# Patient Record
Sex: Male | Born: 1935 | Race: White | Hispanic: No | Smoking: Former smoker
Health system: Southern US, Community
[De-identification: ages and names within clinical notes are randomized; demographics above are authoritative.]

## PROBLEM LIST (undated history)

## (undated) DIAGNOSIS — I2510 Atherosclerotic heart disease of native coronary artery without angina pectoris: Secondary | ICD-10-CM

## (undated) DIAGNOSIS — E785 Hyperlipidemia, unspecified: Secondary | ICD-10-CM

## (undated) DIAGNOSIS — F32A Depression, unspecified: Secondary | ICD-10-CM

## (undated) DIAGNOSIS — D649 Anemia, unspecified: Secondary | ICD-10-CM

## (undated) DIAGNOSIS — H9190 Unspecified hearing loss, unspecified ear: Secondary | ICD-10-CM

## (undated) DIAGNOSIS — I4891 Unspecified atrial fibrillation: Secondary | ICD-10-CM

## (undated) DIAGNOSIS — I509 Heart failure, unspecified: Secondary | ICD-10-CM

## (undated) DIAGNOSIS — C4490 Unspecified malignant neoplasm of skin, unspecified: Secondary | ICD-10-CM

## (undated) DIAGNOSIS — E119 Type 2 diabetes mellitus without complications: Secondary | ICD-10-CM

## (undated) DIAGNOSIS — G629 Polyneuropathy, unspecified: Secondary | ICD-10-CM

## (undated) DIAGNOSIS — I219 Acute myocardial infarction, unspecified: Secondary | ICD-10-CM

## (undated) DIAGNOSIS — J449 Chronic obstructive pulmonary disease, unspecified: Secondary | ICD-10-CM

## (undated) DIAGNOSIS — K921 Melena: Secondary | ICD-10-CM

## (undated) DIAGNOSIS — I10 Essential (primary) hypertension: Secondary | ICD-10-CM

## (undated) DIAGNOSIS — R42 Dizziness and giddiness: Secondary | ICD-10-CM

## (undated) DIAGNOSIS — M1990 Unspecified osteoarthritis, unspecified site: Secondary | ICD-10-CM

## (undated) DIAGNOSIS — M549 Dorsalgia, unspecified: Secondary | ICD-10-CM

## (undated) DIAGNOSIS — E669 Obesity, unspecified: Secondary | ICD-10-CM

## (undated) DIAGNOSIS — K429 Umbilical hernia without obstruction or gangrene: Secondary | ICD-10-CM

## (undated) DIAGNOSIS — K219 Gastro-esophageal reflux disease without esophagitis: Secondary | ICD-10-CM

## (undated) DIAGNOSIS — F329 Major depressive disorder, single episode, unspecified: Secondary | ICD-10-CM

## (undated) DIAGNOSIS — R0902 Hypoxemia: Secondary | ICD-10-CM

## (undated) HISTORY — DX: Type 2 diabetes mellitus without complications: E11.9

## (undated) HISTORY — PX: EYE SURGERY: SHX253

## (undated) HISTORY — DX: Chronic obstructive pulmonary disease, unspecified: J44.9

## (undated) HISTORY — DX: Polyneuropathy, unspecified: G62.9

## (undated) HISTORY — DX: Hyperlipidemia, unspecified: E78.5

## (undated) HISTORY — PX: OTHER SURGICAL HISTORY: SHX169

## (undated) HISTORY — DX: Essential (primary) hypertension: I10

## (undated) HISTORY — DX: Depression, unspecified: F32.A

## (undated) HISTORY — DX: Atherosclerotic heart disease of native coronary artery without angina pectoris: I25.10

## (undated) HISTORY — DX: Unspecified hearing loss, unspecified ear: H91.90

## (undated) HISTORY — PX: CORONARY ARTERY BYPASS GRAFT: SHX141

## (undated) HISTORY — DX: Unspecified malignant neoplasm of skin, unspecified: C44.90

## (undated) HISTORY — PX: MOHS SURGERY: SUR867

## (undated) HISTORY — DX: Dorsalgia, unspecified: M54.9

## (undated) HISTORY — DX: Umbilical hernia without obstruction or gangrene: K42.9

## (undated) HISTORY — PX: CORONARY ANGIOPLASTY: SHX604

## (undated) HISTORY — DX: Melena: K92.1

## (undated) HISTORY — DX: Heart failure, unspecified: I50.9

## (undated) HISTORY — DX: Major depressive disorder, single episode, unspecified: F32.9

## (undated) SURGERY — LAPAROSCOPIC CHOLECYSTECTOMY
Anesthesia: General

---

## 1992-11-29 HISTORY — PX: CORONARY ARTERY BYPASS GRAFT: SHX141

## 2004-12-14 ENCOUNTER — Ambulatory Visit: Payer: Self-pay | Admitting: Radiation Oncology

## 2005-12-15 ENCOUNTER — Ambulatory Visit: Payer: Self-pay | Admitting: Radiation Oncology

## 2006-12-19 ENCOUNTER — Ambulatory Visit: Payer: Self-pay | Admitting: Radiation Oncology

## 2012-01-18 DIAGNOSIS — E785 Hyperlipidemia, unspecified: Secondary | ICD-10-CM | POA: Insufficient documentation

## 2012-01-18 DIAGNOSIS — H9190 Unspecified hearing loss, unspecified ear: Secondary | ICD-10-CM | POA: Insufficient documentation

## 2012-01-18 DIAGNOSIS — E669 Obesity, unspecified: Secondary | ICD-10-CM | POA: Insufficient documentation

## 2012-01-18 DIAGNOSIS — I2510 Atherosclerotic heart disease of native coronary artery without angina pectoris: Secondary | ICD-10-CM | POA: Insufficient documentation

## 2012-01-18 DIAGNOSIS — G629 Polyneuropathy, unspecified: Secondary | ICD-10-CM | POA: Insufficient documentation

## 2012-08-11 DIAGNOSIS — M549 Dorsalgia, unspecified: Secondary | ICD-10-CM | POA: Insufficient documentation

## 2012-08-23 DIAGNOSIS — F329 Major depressive disorder, single episode, unspecified: Secondary | ICD-10-CM | POA: Insufficient documentation

## 2012-08-23 DIAGNOSIS — Q792 Exomphalos: Secondary | ICD-10-CM | POA: Insufficient documentation

## 2012-08-23 DIAGNOSIS — D039 Melanoma in situ, unspecified: Secondary | ICD-10-CM | POA: Insufficient documentation

## 2014-06-24 ENCOUNTER — Emergency Department: Payer: Self-pay | Admitting: Emergency Medicine

## 2014-06-24 LAB — COMPREHENSIVE METABOLIC PANEL
Albumin: 3.4 g/dL (ref 3.4–5.0)
Alkaline Phosphatase: 71 U/L
Anion Gap: 7 (ref 7–16)
BUN: 22 mg/dL — ABNORMAL HIGH (ref 7–18)
Bilirubin,Total: 0.4 mg/dL (ref 0.2–1.0)
Calcium, Total: 9.1 mg/dL (ref 8.5–10.1)
Chloride: 106 mmol/L (ref 98–107)
Co2: 26 mmol/L (ref 21–32)
Creatinine: 1.45 mg/dL — ABNORMAL HIGH (ref 0.60–1.30)
EGFR (African American): 51 — ABNORMAL LOW
EGFR (Non-African Amer.): 44 — ABNORMAL LOW
GLUCOSE: 118 mg/dL — AB (ref 65–99)
OSMOLALITY: 282 (ref 275–301)
Potassium: 4.6 mmol/L (ref 3.5–5.1)
SGOT(AST): 23 U/L (ref 15–37)
SGPT (ALT): 18 U/L
Sodium: 139 mmol/L (ref 136–145)
Total Protein: 7.1 g/dL (ref 6.4–8.2)

## 2014-06-24 LAB — CBC
HCT: 35.6 % — ABNORMAL LOW (ref 40.0–52.0)
HGB: 11.8 g/dL — AB (ref 13.0–18.0)
MCH: 30.6 pg (ref 26.0–34.0)
MCHC: 33.2 g/dL (ref 32.0–36.0)
MCV: 92 fL (ref 80–100)
Platelet: 253 10*3/uL (ref 150–440)
RBC: 3.86 10*6/uL — AB (ref 4.40–5.90)
RDW: 14.5 % (ref 11.5–14.5)
WBC: 8.8 10*3/uL (ref 3.8–10.6)

## 2014-06-24 LAB — TROPONIN I
Troponin-I: <0.02 ng/mL
Troponin-I: <0.02 ng/mL

## 2014-06-24 LAB — PROTIME-INR
INR: 0.9
Prothrombin Time: 12.1 secs (ref 11.5–14.7)

## 2014-06-24 LAB — APTT: ACTIVATED PTT: 29.6 s (ref 23.6–35.9)

## 2014-06-24 LAB — CK TOTAL AND CKMB (NOT AT ARMC)
CK, Total: 61 U/L
CK-MB: 1.1 ng/mL (ref 0.5–3.6)

## 2015-01-14 ENCOUNTER — Inpatient Hospital Stay: Payer: Self-pay | Admitting: Internal Medicine

## 2015-02-05 ENCOUNTER — Ambulatory Visit: Payer: Self-pay | Admitting: Family

## 2015-03-30 NOTE — Consult Note (Signed)
PATIENT NAME:  Justin Leonard, Justin Leonard MR#:  [id number] DATE OF BIRTH:  09/04/1927  DATE OF CONSULTATION:  01/14/2015  REFERRING PHYSICIAN:  Ceasar Lund. Anselm Jungling, MD CONSULTING PHYSICIAN:  Isaias Cowman, MD  PRIMARY CARE PHYSICIAN:  [name], MD  CARDIOLOGIST:  Pleas Patricia, MD, at [facility].   HISTORY OF PRESENT ILLNESS: The patient is an 81 year old gentleman with known history of coronary artery disease, status post CABG, non-insulin-dependent diabetes, hypertension, hyperlipidemia. He presents with a 4-5 day history of productive cough with greenish sputum. He saw his primary care physician and was advised to go to [facility] Emergency Room. Chest x-ray revealed evidence for pulmonary vascular congestion with borderline elevated troponin, mildly elevated BNP, and abnormal ECG. The patient denies chest pain.   PAST MEDICAL HISTORY: 1.  Status post CABG.  2.  Hypertension.  3.  Hyperlipidemia.  4.  Non-insulin-dependent diabetes.  5.  Bilateral peripheral neuropathy.  6.  Obesity.  7.  Deafness.  8.  Depression.  9.  History of GI bleed.   MEDICATIONS: Metoprolol 50 mg b.i.d., lovastatin 20 mg daily, lisinopril 40 mg daily,  isosorbide mononitrate 30 mg daily, hydrochlorothiazide 25 mg daily, amlodipine 10 mg daily, metformin 500 mg t.i.d., lovastatin 20 mg daily, glyburide 5 mg b.i.d., fluoxetine 20 mg daily.   SOCIAL HISTORY: The patient is married, lives at home with his wife. He quit tobacco abuse in the 1980s.   FAMILY HISTORY: No immediate family history of coronary artery disease or myocardial infarction.   REVIEW OF SYSTEMS:  CONSTITUTIONAL: No fever or chills.  EYES: No blurry vision.  EARS: No hearing loss.  RESPIRATORY: Shortness of breath with productive cough as described above.  CARDIOVASCULAR: No chest pain.  GASTROINTESTINAL: No nausea, vomiting, or diarrhea.  GENITOURINARY: No dysuria or hematuria.  ENDOCRINE: No polyuria or polydipsia.  MUSCULOSKELETAL:  No arthralgias or myalgias.  NEUROLOGICAL: No focal muscle weakness or numbness.  PSYCHOLOGICAL: No depression or anxiety.   PHYSICAL EXAMINATION: VITAL SIGNS: Blood pressure 147/70, pulse 83, respirations 18, temperature 98, pulse oximetry 98%.  HEENT: Pupils equal, reactive to light and accommodation.  NECK: Supple without thyromegaly.  LUNGS: Revealed a few expiratory wheezes.  CARDIOVASCULAR: Normal JVP. Normal PMI. Regular rate and rhythm. Normal S1, S2. No appreciable gallop, murmur, or rub.  ABDOMEN: Soft and nontender. Pulses were intact bilaterally.  MUSCULOSKELETAL: Normal muscle tone.  NEUROLOGIC: The patient is alert and oriented x 3. Motor and sensory both grossly intact.   IMPRESSION: An 81 year old gentleman who presents with probable upper respiratory infection, bronchitis with early pneumonia complicated with pulmonary edema and congestive heart failure. The patient has borderline elevated troponin in the setting of chronic kidney disease and anemia. This likely represents demand, supply ischemia and not an acute coronary syndrome. The patient currently denies chest pain.   RECOMMENDATIONS: 1.  Agree with overall current therapy.  2.  Continue heparin drip 48-72 hours.  3.  Review 2-D echocardiogram.  4.  Further recommendations pending the patient's initial clinical course and echocardiogram results.    ____________________________ Isaias Cowman, MD ap:LT D: 01/14/2015 17:53:32 ET T: 01/14/2015 18:12:41 ET JOB#: [id number]  cc: Isaias Cowman, MD, <Dictator> Isaias Cowman MD ELECTRONICALLY SIGNED 01/21/2015 14:37

## 2015-03-30 NOTE — H&P (Signed)
PATIENT NAME:  Justin Leonard, MIERZWA MR#:  [id number] DATE OF BIRTH:  07/02/1927  DATE OF ADMISSION:  01/14/2015  PRIMARY CARE PHYSICIAN: [place].   PRIMARY CARDIOLOGIST: Pleas Patricia, MD ([facility])  REFERRING EMERGENCY ROOM PHYSICIAN: Ferman Hamming, MD   CHIEF COMPLAINT: Cough.  HISTORY OF PRESENT ILLNESS: This is an 81 year old man who has history of coronary artery disease and CABG who has non-insulin-dependent diabetes, hypertension, hyperlipidemia. For the last 4 to 5 days he had complaint of cough and some greenish sputum production so he went to his primary care's office. They did some chest x-ray which shows some heterogenous opacity in left mid lung and lower lung suspecting possibility of atypical community-acquired pneumonia and so they advised him to go to the Emergency Room. The patient came here in ER for the same thing. In initial work-up in our Emergency Room, his chest x-ray showed enlargement of cardiac silhouette with pulmonary vascular congestion and CABG and there is atelectasis in left midlung. His BNP was elevated up to 2533 and his troponin was borderline high of 0.3 with some new EKG changes of T wave inversions in lateral leads, so he was given to hospitalist team for further management. He denies any chest pain or palpitation complaints.   REVIEW OF SYSTEMS: CONSTITUTIONAL: Negative for fever, fatigue, weakness, pain, or weight loss.  EYES: No blurring, double vision, discharge or redness.  EARS, NOSE, THROAT: No tinnitus, ear pain or hearing loss.  RESPIRATORY: The patient has some cough, mild wheezing and some shortness of breath.  CARDIOVASCULAR: No chest pain, orthopnea, edema, arrhythmia or palpitations.  GASTROINTESTINAL: No nausea, vomiting, diarrhea or abdominal pain.  GENITOURINARY: No dysuria, hematuria or increased frequency.  ENDOCRINE: No heat or cold intolerance. No excessive sweating.  SKIN: No acne, rashes or lesions.  MUSCULOSKELETAL: No pain  or swelling in the joints.  NEUROLOGICAL: No numbness, weakness, tremor, or vertigo. PSYCHIATRIC: No anxiety, insomnia, bipolar disorder.   PAST MEDICAL HISTORY: 1.  Coronary artery disease status post CABG.  2.  Non-insulin-dependent diabetes.  3.  Essential hypertension.  4.  Hyperlipidemia.  5.  Obesity.  6.  Deafness. 7.  Bilateral peripheral neuropathy, primary numbness affecting fingers in both hands bilaterally.  8.  Back pain.  9.  Umbilical hernia.  10.  Depression. 11.  Melena.   SOCIAL HISTORY: He was a smoker, stopped smoking in 48s. He denies alcohol or illegal drug use. Does not require any support. Lives at home and independent with his wife.  FAMILY HISTORY: Positive for diabetes in some family members.   HOME MEDICATIONS: 1.  Metoprolol 50 mg 2 times a day.  2.  Metformin 500 mg oral 3 times a day.  3.  Lovastatin 20 mg once a day.  4.  Lisinopril 40 mg once a day.  5.  Isosorbide mononitrate 30 mg once a day. 6.  Hydrochlorothiazide 25 mg once a day. 7.  Glyburide 5 mg 2 times a day.  8.  Fluoxetine 20 mg oral once a day.  9.  Amlodipine 10 mg once a day.   PHYSICAL EXAMINATION: VITAL SIGNS: In the ER, temperature 98.7, pulse 67, respirations 30, blood pressure 115/55, pulse ox 91 on room air.  GENERAL: The patient is fully alert and oriented to time, place, and person. Does not appear in acute distress.  HEENT: Head and neck atraumatic. Conjunctivae pink. Oral mucosa moist.  NECK: Supple. No JVD. Thyroid nontender.  RESPIRATORY: Bilateral equal air entry. Some crepitation and mild wheezing  present.  CARDIOVASCULAR: S1, S2 present, regular. No murmur.  ABDOMEN: Soft, nontender. Bowel sounds present. No organomegaly. No distention.  LEGS: No edema.  SKIN: No acne, rashes, or lesions.  MUSCULOSKELETAL: No tenderness or swelling in the joints.  NEUROLOGICAL: Power 5/5 and moves all 4 limbs. No tremor or rigidity.  PSYCHIATRIC: Does not appear in acute  psychiatric illness.   DIAGNOSTIC DATA: Chest x-ray, portable, shows mild enlargement of cardiac silhouette and pulmonary vascular congestion and CABG. COPD changes with left mid lung atelectasis.   INR is 1.2.   BNP 2533.   EKG shows sinus rhythm with some T wave inversion in lateral chest leads.   Glucose 177, BUN 22, creatinine 1.65, sodium 134, potassium 4.4, CO2 26, and calcium 9.5.   Troponin is 0.3.   WBC 12.3, hemoglobin 10.3, platelet count 271,000, MCV 89.  ASSESSMENT AND PLAN: An 81 year old male who has history of coronary artery disease and CABG, hypertension, hyperlipidemia and diabetes who was sent by primary care because of complaint of feeling shortness of breath and cough and found having findings of pneumonia on chest x-ray in PMDs office.  1.  Acute congestive heart failure. He has not had echocardiogram in the past so it is hard to say about diastolic versus systolic at this point. We will admit to telemetry, follow serial troponins, give IV Lasix low dose, and monitor renal function as it is already compromised and oxygen as needed basis and we will follow in and out and get an echocardiogram done. We will also get cardiology consult for further help. 2.  Elevated troponin. Troponin is 0.3, with the patient having new EKG changes without any chest pain. The patient is started on heparin drip by ER physician. I agree with that and currently continue. Will follow serial troponin, give beta blockers and aspirin and let cardiology decide further plan.  3.  Acute bronchitis. The patient was a smoker for a long time in the past, but quit since many years and does not require any inhaler on a regular basis. Currently, there is mild wheezing and has cough and sputum production for a few days so will start him on Rocephin and azithromycin for now and we will decide about requirement of antibiotic at the time of discharge with steroids.  4.  Diabetes. The patient has mild worsening in  renal function, so we would just like to monitor on insulin sliding scale coverage  5.  Worsening of renal function. As per previous record, creatinine was 1.45, today is 1.65. We will continue monitoring as the patient will be on Lasix IV.  6.  History of coronary artery disease. We will continue aspirin, statin and metoprolol and cardiology to follow in this admission.   CODE STATUS: FULL.  TOTAL TIME SPENT ON THIS ADMISSION: 50 minutes.   ____________________________ Ceasar Lund Anselm Jungling, MD vgv:sb D: 01/14/2015 16:35:20 ET T: 01/14/2015 16:55:35 ET JOB#: [id number]  cc: Ceasar Lund. Anselm Jungling, MD, <Dictator> Vaughan Basta MD ELECTRONICALLY SIGNED 01/15/2015 16:15

## 2015-03-30 NOTE — Discharge Summary (Signed)
PATIENT NAME:  Justin Leonard, Justin Leonard MR#:  [id number] DATE OF BIRTH:  Oct 22, 1927  For a detailed note, please see the history and physical done on admission by Dr. Boyce Medici.    DIAGNOSES AT DISCHARGE: As follows:  1.  Acute on chronic congestive heart failure secondary to diastolic dysfunction. 2.  Elevated troponin likely in the setting of demand ischemia. 3.  Hypertension.  4.  Diabetes.  5.  Acute and chronic renal failure. 6.  Chronic obstructive pulmonary disease.   DIET: The patient is being discharged on a low-sodium, low-fat, carb-controlled diet.   ACTIVITY: As tolerated.   FOLLOW UP:  Follow-up with Dr. Eston Esters in the next 1-2 weeks.    DISCHARGE MEDICATIONS:  1.  Imdur 30 mg daily. 2.  Lisinopril 40 mg daily. 3.  Lovastatin 20 mg daily. 4.  Metformin 5 mg t.i.d. 5.  Metoprolol tartrate 50 mg b.i.d. 6.  Norvasc 10 mg daily. 7.  Fluoxetine 20 mg daily. 8.  Glyburide 5 mg b.i.d. 9.  Ofloxacin 0.3% ophthalmic solution to be applied 4 times daily for 10 days. 10.  Spiriva 1 puff daily. 11.  Lasix 40 mg daily. 12.  Potassium 10 mEq daily. 13.  Levaquin 5 mg daily x 5 days.   CONSULTATIONS DURING HOSPITAL COURSE: Dr. Saralyn Pilar from cardiology.   PERTINENT STUDIES PERFORMED DURING THE HOSPITAL COURSE: A chest x-ray done on admission showing minimal enlargement of cardiac silhouette with pulmonary vascular congestion, chronic obstructive pulmonary disease with minimal atelectasis in the left midlung. A 2-dimensional echocardiogram showing ejection fraction of 55%-60%, mild left ventricular hypertrophy, mildly dilated left atrium, mild mitral valve regurgitation.   HOSPITAL COURSE: This is a 81 year old male with medical problems as mentioned above, presented to the hospital with shortness of breath and cough and noted to be in congestive heart failure and also noted to have bronchitis.  1.  Congestive heart failure. This was likely acute on chronic diastolic dysfunction. The  patient was admitted to the hospital, started on IV Lasix, has diuresed well with the diuretics. His echocardiogram showed normal ejection fraction. The patient was seen by cardiology who agreed with this management. At this point, the patient is being discharged on some Lasix along with maintenance of his beta blockers and ACE inhibitors.   2.  Elevated troponin. This was likely in the setting of demand ischemia from the congestive heart failure. The patient was seen by cardiology. Initially, they thought this could possibly be an NSTEMI. The patient was started on a heparin drip, but this was quickly discontinued as his troponins did not trend upwards. His echocardiogram showed normal ejection fraction as per cardiology. There is no plan on doing any acute cardiac intervention. The patient was discharged on aspirin, statin, beta blocker, Imdur and ACE inhibitor as stated.   3.  Acute bronchitis with underlying chronic obstructive pulmonary disease. The patient's chest x-ray findings were suggestive of chronic obstructive pulmonary disease. He was started on some Spiriva while in the hospital. He was discharged on that along with some empiric Levaquin. While in the hospital he was maintained on ceftriaxone and Zithromax. His bronchospasm and wheezing has improved and resolved now.   4.  Diabetes. The patient's creatinine was a little bit elevated; therefore, his metformin was held. He was maintained on sliding scale insulin. Since his renal function is improved he is being discharged back on his oral metformin.  5.  Acute on chronic renal failure. This was likely secondary to his congestive heart failure. The  patient was diuresed. His creatinine is back to baseline. His ACE inhibitor and metformin were held, although they are being resumed upon discharge.   CODE STATUS: The patient is a full code.   DISPOSITION: He is being discharged home.    TIME SPENT: 40  minutes.   ____________________________ Belia Heman. Verdell Carmine, MD vjs:mc D: 01/16/2015 15:58:00 ET T: 01/16/2015 16:42:34 ET JOB#: [id number]  cc: Isaias Cowman, MD Belia Heman. Verdell Carmine, MD, <Dictator>   Henreitta Leber MD ELECTRONICALLY SIGNED 01/29/2015 15:36

## 2015-04-17 DIAGNOSIS — J449 Chronic obstructive pulmonary disease, unspecified: Secondary | ICD-10-CM | POA: Insufficient documentation

## 2015-04-17 DIAGNOSIS — I10 Essential (primary) hypertension: Secondary | ICD-10-CM | POA: Insufficient documentation

## 2015-04-17 DIAGNOSIS — E119 Type 2 diabetes mellitus without complications: Secondary | ICD-10-CM

## 2015-04-17 DIAGNOSIS — I5032 Chronic diastolic (congestive) heart failure: Secondary | ICD-10-CM | POA: Insufficient documentation

## 2015-04-17 DIAGNOSIS — J410 Simple chronic bronchitis: Secondary | ICD-10-CM

## 2015-07-15 ENCOUNTER — Encounter: Payer: Self-pay | Admitting: Emergency Medicine

## 2015-07-15 ENCOUNTER — Inpatient Hospital Stay
Admission: EM | Admit: 2015-07-15 | Discharge: 2015-07-18 | DRG: 246 | Disposition: A | Discharge status: Home Health | Payer: Medicare Other | Attending: Internal Medicine | Admitting: Internal Medicine

## 2015-07-15 ENCOUNTER — Other Ambulatory Visit: Payer: Self-pay

## 2015-07-15 ENCOUNTER — Emergency Department: Payer: Medicare Other

## 2015-07-15 DIAGNOSIS — Z85828 Personal history of other malignant neoplasm of skin: Secondary | ICD-10-CM

## 2015-07-15 DIAGNOSIS — Z951 Presence of aortocoronary bypass graft: Secondary | ICD-10-CM

## 2015-07-15 DIAGNOSIS — E785 Hyperlipidemia, unspecified: Secondary | ICD-10-CM | POA: Diagnosis present

## 2015-07-15 DIAGNOSIS — H9190 Unspecified hearing loss, unspecified ear: Secondary | ICD-10-CM | POA: Diagnosis present

## 2015-07-15 DIAGNOSIS — J9601 Acute respiratory failure with hypoxia: Secondary | ICD-10-CM | POA: Diagnosis present

## 2015-07-15 DIAGNOSIS — Z7982 Long term (current) use of aspirin: Secondary | ICD-10-CM

## 2015-07-15 DIAGNOSIS — I25810 Atherosclerosis of coronary artery bypass graft(s) without angina pectoris: Secondary | ICD-10-CM | POA: Diagnosis present

## 2015-07-15 DIAGNOSIS — I10 Essential (primary) hypertension: Secondary | ICD-10-CM | POA: Diagnosis present

## 2015-07-15 DIAGNOSIS — Z87891 Personal history of nicotine dependence: Secondary | ICD-10-CM | POA: Diagnosis not present

## 2015-07-15 DIAGNOSIS — E1165 Type 2 diabetes mellitus with hyperglycemia: Secondary | ICD-10-CM | POA: Diagnosis present

## 2015-07-15 DIAGNOSIS — J441 Chronic obstructive pulmonary disease with (acute) exacerbation: Secondary | ICD-10-CM | POA: Diagnosis present

## 2015-07-15 DIAGNOSIS — I5033 Acute on chronic diastolic (congestive) heart failure: Principal | ICD-10-CM | POA: Diagnosis present

## 2015-07-15 DIAGNOSIS — T380X5A Adverse effect of glucocorticoids and synthetic analogues, initial encounter: Secondary | ICD-10-CM | POA: Diagnosis present

## 2015-07-15 DIAGNOSIS — R7989 Other specified abnormal findings of blood chemistry: Secondary | ICD-10-CM | POA: Diagnosis present

## 2015-07-15 DIAGNOSIS — R778 Other specified abnormalities of plasma proteins: Secondary | ICD-10-CM | POA: Diagnosis present

## 2015-07-15 DIAGNOSIS — I2510 Atherosclerotic heart disease of native coronary artery without angina pectoris: Secondary | ICD-10-CM | POA: Diagnosis present

## 2015-07-15 LAB — COMPREHENSIVE METABOLIC PANEL
ALT: 13 U/L — ABNORMAL LOW (ref 17–63)
AST: 23 U/L (ref 15–41)
Albumin: 3.3 g/dL — ABNORMAL LOW (ref 3.5–5.0)
Alkaline Phosphatase: 65 U/L (ref 38–126)
Anion gap: 10 (ref 5–15)
BILIRUBIN TOTAL: 0.9 mg/dL (ref 0.3–1.2)
BUN: 18 mg/dL (ref 6–20)
CHLORIDE: 104 mmol/L (ref 101–111)
CO2: 26 mmol/L (ref 22–32)
Calcium: 9.5 mg/dL (ref 8.9–10.3)
Creatinine, Ser: 0.97 mg/dL (ref 0.61–1.24)
GFR calc Af Amer: >60 mL/min (ref >60)
GFR calc non Af Amer: >60 mL/min (ref >60)
Glucose, Bld: 106 mg/dL — ABNORMAL HIGH (ref 65–99)
Potassium: 4.8 mmol/L (ref 3.5–5.1)
Sodium: 140 mmol/L (ref 135–145)
TOTAL PROTEIN: 6.6 g/dL (ref 6.5–8.1)

## 2015-07-15 LAB — CBC WITH DIFFERENTIAL/PLATELET
BASOS ABS: 0.1 10*3/uL (ref 0–0.1)
Basophils Relative: 1 %
Eosinophils Absolute: 0.2 10*3/uL (ref 0–0.7)
Eosinophils Relative: 2 %
HEMATOCRIT: 31.6 % — AB (ref 40.0–52.0)
HEMOGLOBIN: 10 g/dL — AB (ref 13.0–18.0)
Lymphocytes Relative: 11 %
Lymphs Abs: 1.1 10*3/uL (ref 1.0–3.6)
MCH: 27.1 pg (ref 26.0–34.0)
MCHC: 31.5 g/dL — ABNORMAL LOW (ref 32.0–36.0)
MCV: 86.2 fL (ref 80.0–100.0)
MONO ABS: 0.8 10*3/uL (ref 0.2–1.0)
Monocytes Relative: 8 %
NEUTROS ABS: 7.5 10*3/uL — AB (ref 1.4–6.5)
NEUTROS PCT: 78 %
Platelets: 271 10*3/uL (ref 150–440)
RBC: 3.67 MIL/uL — ABNORMAL LOW (ref 4.40–5.90)
RDW: 15.6 % — AB (ref 11.5–14.5)
WBC: 9.6 10*3/uL (ref 3.8–10.6)

## 2015-07-15 LAB — MAGNESIUM: MAGNESIUM: 2.1 mg/dL (ref 1.7–2.4)

## 2015-07-15 LAB — TROPONIN I
TROPONIN I: 0.21 ng/mL — AB (ref <0.031)
Troponin I: 0.26 ng/mL — ABNORMAL HIGH (ref <0.031)

## 2015-07-15 LAB — BRAIN NATRIURETIC PEPTIDE: B NATRIURETIC PEPTIDE 5: 282 pg/mL — AB (ref 0.0–100.0)

## 2015-07-15 LAB — GLUCOSE, CAPILLARY: Glucose-Capillary: 372 mg/dL — ABNORMAL HIGH (ref 65–99)

## 2015-07-15 MED ORDER — ATORVASTATIN CALCIUM 20 MG PO TABS: 40.0000 mg | ORAL_TABLET | Freq: Every day | ORAL | Status: DC

## 2015-07-15 MED ORDER — PRAVASTATIN SODIUM 20 MG PO TABS
20.0000 mg | ORAL_TABLET | Freq: Every day | ORAL | Status: DC
  Administered 2015-07-15 – 2015-07-16 (×2): 20 mg via ORAL
  Filled 2015-07-15 (×2): qty 1

## 2015-07-15 MED ORDER — IPRATROPIUM BROMIDE 0.02 % IN SOLN
0.5000 mg | Freq: Once | RESPIRATORY_TRACT | Status: AC
  Administered 2015-07-15: 0.5 mg via RESPIRATORY_TRACT
  Filled 2015-07-15: qty 2.5

## 2015-07-15 MED ORDER — SODIUM CHLORIDE 0.9 % IJ SOLN
3.0000 mL | Freq: Two times a day (BID) | INTRAMUSCULAR | Status: DC
  Administered 2015-07-15 – 2015-07-17 (×4): 3 mL via INTRAVENOUS

## 2015-07-15 MED ORDER — LISINOPRIL 5 MG PO TABS: 5.0000 mg | ORAL_TABLET | Freq: Every day | ORAL | Status: DC

## 2015-07-15 MED ORDER — SODIUM CHLORIDE 0.9 % IV SOLN: 250.0000 mL | INTRAVENOUS | Status: DC | PRN

## 2015-07-15 MED ORDER — ASPIRIN EC 325 MG PO TBEC
325.0000 mg | DELAYED_RELEASE_TABLET | Freq: Every day | ORAL | Status: DC
  Administered 2015-07-16 – 2015-07-17 (×2): 325 mg via ORAL
  Filled 2015-07-15 (×2): qty 1

## 2015-07-15 MED ORDER — INSULIN ASPART 100 UNIT/ML ~~LOC~~ SOLN
0.0000 [IU] | Freq: Three times a day (TID) | SUBCUTANEOUS | Status: DC
  Administered 2015-07-16: 5 [IU] via SUBCUTANEOUS
  Filled 2015-07-15: qty 7

## 2015-07-15 MED ORDER — ISOSORBIDE MONONITRATE ER 30 MG PO TB24
30.0000 mg | ORAL_TABLET | Freq: Every day | ORAL | Status: DC
  Administered 2015-07-16 – 2015-07-18 (×3): 30 mg via ORAL
  Filled 2015-07-15 (×3): qty 1

## 2015-07-15 MED ORDER — ASPIRIN 325 MG PO TABS
325.0000 mg | ORAL_TABLET | Freq: Once | ORAL | Status: AC
  Administered 2015-07-15: 325 mg via ORAL
  Filled 2015-07-15: qty 1

## 2015-07-15 MED ORDER — NITROGLYCERIN 0.4 MG SL SUBL
0.4000 mg | SUBLINGUAL_TABLET | SUBLINGUAL | Status: DC | PRN
  Administered 2015-07-17 (×2): 0.4 mg via SUBLINGUAL
  Filled 2015-07-15: qty 1

## 2015-07-15 MED ORDER — AMLODIPINE BESYLATE 10 MG PO TABS
10.0000 mg | ORAL_TABLET | Freq: Every day | ORAL | Status: DC
  Administered 2015-07-16 – 2015-07-18 (×3): 10 mg via ORAL
  Filled 2015-07-15 (×3): qty 1

## 2015-07-15 MED ORDER — LISINOPRIL 20 MG PO TABS
40.0000 mg | ORAL_TABLET | Freq: Every day | ORAL | Status: DC
  Administered 2015-07-16 – 2015-07-18 (×3): 40 mg via ORAL
  Filled 2015-07-15 (×3): qty 2

## 2015-07-15 MED ORDER — METHYLPREDNISOLONE SODIUM SUCC 125 MG IJ SOLR
125.0000 mg | Freq: Once | INTRAMUSCULAR | Status: AC
  Administered 2015-07-15: 125 mg via INTRAVENOUS
  Filled 2015-07-15: qty 2

## 2015-07-15 MED ORDER — HYDROCODONE-ACETAMINOPHEN 5-325 MG PO TABS: 1.0000 | ORAL_TABLET | Freq: Four times a day (QID) | ORAL | Status: DC | PRN

## 2015-07-15 MED ORDER — METOPROLOL TARTRATE 50 MG PO TABS
50.0000 mg | ORAL_TABLET | Freq: Two times a day (BID) | ORAL | Status: DC
  Administered 2015-07-15 – 2015-07-18 (×5): 50 mg via ORAL
  Filled 2015-07-15 (×6): qty 1

## 2015-07-15 MED ORDER — TIOTROPIUM BROMIDE MONOHYDRATE 18 MCG IN CAPS
18.0000 ug | ORAL_CAPSULE | Freq: Every day | RESPIRATORY_TRACT | Status: DC
  Administered 2015-07-16 – 2015-07-18 (×3): 18 ug via RESPIRATORY_TRACT
  Filled 2015-07-15: qty 5

## 2015-07-15 MED ORDER — ACETAMINOPHEN 325 MG PO TABS: 650.0000 mg | ORAL_TABLET | Freq: Four times a day (QID) | ORAL | Status: DC | PRN

## 2015-07-15 MED ORDER — HEPARIN SODIUM (PORCINE) 5000 UNIT/ML IJ SOLN
5000.0000 [IU] | Freq: Three times a day (TID) | INTRAMUSCULAR | Status: DC
  Administered 2015-07-15 – 2015-07-18 (×7): 5000 [IU] via SUBCUTANEOUS
  Filled 2015-07-15 (×8): qty 1

## 2015-07-15 MED ORDER — ALBUTEROL SULFATE (2.5 MG/3ML) 0.083% IN NEBU
2.5000 mg | INHALATION_SOLUTION | Freq: Once | RESPIRATORY_TRACT | Status: AC
  Administered 2015-07-15: 2.5 mg via RESPIRATORY_TRACT
  Filled 2015-07-15: qty 3

## 2015-07-15 MED ORDER — ASPIRIN 81 MG PO CHEW
CHEWABLE_TABLET | ORAL | Status: AC
  Filled 2015-07-15: qty 4

## 2015-07-15 MED ORDER — FLUOXETINE HCL 20 MG PO CAPS
20.0000 mg | ORAL_CAPSULE | Freq: Every day | ORAL | Status: DC
  Administered 2015-07-16 – 2015-07-18 (×3): 20 mg via ORAL
  Filled 2015-07-15 (×3): qty 1

## 2015-07-15 MED ORDER — FUROSEMIDE 10 MG/ML IJ SOLN
40.0000 mg | Freq: Two times a day (BID) | INTRAMUSCULAR | Status: DC
  Administered 2015-07-15 – 2015-07-18 (×5): 40 mg via INTRAVENOUS
  Filled 2015-07-15 (×5): qty 4

## 2015-07-15 MED ORDER — ONDANSETRON HCL 4 MG PO TABS: 4.0000 mg | ORAL_TABLET | Freq: Four times a day (QID) | ORAL | Status: DC | PRN

## 2015-07-15 MED ORDER — METHYLPREDNISOLONE SODIUM SUCC 125 MG IJ SOLR
60.0000 mg | Freq: Four times a day (QID) | INTRAMUSCULAR | Status: DC
  Administered 2015-07-15 – 2015-07-17 (×8): 60 mg via INTRAVENOUS
  Filled 2015-07-15 (×8): qty 2

## 2015-07-15 MED ORDER — SODIUM CHLORIDE 0.9 % IJ SOLN: 3.0000 mL | INTRAMUSCULAR | Status: DC | PRN

## 2015-07-15 MED ORDER — ONDANSETRON HCL 4 MG/2ML IJ SOLN: 4.0000 mg | Freq: Four times a day (QID) | INTRAMUSCULAR | Status: DC | PRN

## 2015-07-15 MED ORDER — ACETAMINOPHEN 650 MG RE SUPP: 650.0000 mg | Freq: Four times a day (QID) | RECTAL | Status: DC | PRN

## 2015-07-15 MED ORDER — INSULIN ASPART 100 UNIT/ML ~~LOC~~ SOLN
0.0000 [IU] | Freq: Every day | SUBCUTANEOUS | Status: DC
  Administered 2015-07-15: 5 [IU] via SUBCUTANEOUS
  Filled 2015-07-15: qty 5

## 2015-07-15 MED ORDER — ALBUTEROL SULFATE (2.5 MG/3ML) 0.083% IN NEBU: 2.5000 mg | INHALATION_SOLUTION | RESPIRATORY_TRACT | Status: DC | PRN

## 2015-07-15 NOTE — ED Provider Notes (Signed)
CSN: [id number]     Arrival date & time 07/15/15  1142 History   First MD Initiated Contact with Patient 07/15/15 1147     Chief Complaint  Patient presents with  . Shortness of Breath     (Consider location/radiation/quality/duration/timing/severity/associated sxs/prior Treatment) The history is provided by the patient and the EMS personnel.  ERICE AHLES is a 81 y.o. male hx of CHF, CABG, COPD here with shortness of breath. Shortness of breath for the last several days, unable to sleep last night due to shortness of breath. Has nonproductive cough. Called EMS yesterday and was given a neb and refused transport. Went to see PCP, was found to be hypoxic 70% so EMS was called and patient was sent to the ED. Patient is not on oxygen at home. Was admitted several months ago for COPD and pneumonia. Quit smoking several years ago.    Level V caveat- condition of patient   Past Medical History  Diagnosis Date  . CHF (congestive heart failure)   . Hypertension   . Diabetes mellitus without complication   . Coronary artery disease   . Hyperlipidemia   . Deafness   . Peripheral neuropathy   . Back pain   . Umbilical hernia   . Depression   . Melena   . COPD (chronic obstructive pulmonary disease)   . Skin cancer    Past Surgical History  Procedure Laterality Date  . Coronary artery bypass graft     History reviewed. No pertinent family history. Social History  Substance Use Topics  . Smoking status: Former Smoker -- 3.00 packs/day for 30 years    Types: Cigarettes    Quit date: 04/16/1978  . Smokeless tobacco: Never Used     Comment: Quit smoking 1979  . Alcohol Use: No    Review of Systems  Respiratory: Positive for cough and shortness of breath.   All other systems reviewed and are negative.     Allergies  Review of patient's allergies indicates no known allergies.  Home Medications   Prior to Admission medications   Medication Sig Start Date End Date Taking?  Authorizing Provider  acetaminophen (TYLENOL) 325 MG tablet Take 650 mg by mouth every 6 (six) hours as needed.   Yes Historical Provider, MD  amLODipine (NORVASC) 10 MG tablet Take 10 mg by mouth daily.   Yes Historical Provider, MD  aspirin EC 81 MG tablet Take 81 mg by mouth daily.   Yes Historical Provider, MD  FLUoxetine (PROZAC) 20 MG capsule Take 20 mg by mouth daily.   Yes Historical Provider, MD  furosemide (LASIX) 20 MG tablet Take 20 mg by mouth as needed.   Yes Historical Provider, MD  furosemide (LASIX) 40 MG tablet Take 40 mg by mouth daily.   Yes Historical Provider, MD  glyBURIDE (DIABETA) 5 MG tablet Take 5 mg by mouth 2 (two) times daily with a meal.   Yes Historical Provider, MD  HYDROcodone-acetaminophen (NORCO/VICODIN) 5-325 MG per tablet Take 1 tablet by mouth as needed. 03/10/15 10/25/15 Yes Historical Provider, MD  isosorbide mononitrate (IMDUR) 30 MG 24 hr tablet Take 30 mg by mouth daily.   Yes Historical Provider, MD  lisinopril (PRINIVIL,ZESTRIL) 40 MG tablet Take 40 mg by mouth daily.   Yes Historical Provider, MD  lovastatin (MEVACOR) 20 MG tablet Take 20 mg by mouth at bedtime.   Yes Historical Provider, MD  metFORMIN (GLUCOPHAGE) 500 MG tablet Take by mouth 3 (three) times daily.   Yes  Historical Provider, MD  metoprolol (LOPRESSOR) 50 MG tablet Take 50 mg by mouth 2 (two) times daily.   Yes Historical Provider, MD  nitroGLYCERIN (NITROSTAT) 0.4 MG SL tablet Place 1 tablet under the tongue every 5 (five) minutes as needed. 07/15/15 07/14/16 Yes Historical Provider, MD  tiotropium (SPIRIVA) 18 MCG inhalation capsule Place 18 mcg into inhaler and inhale daily.   Yes Historical Provider, MD  VENTOLIN HFA 108 (90 BASE) MCG/ACT inhaler Inhale 1-2 puffs into the lungs as needed. 06/24/15  Yes Historical Provider, MD  ofloxacin (OCUFLOX) 0.3 % ophthalmic solution Place 1 drop into both eyes 4 (four) times daily.    Historical Provider, MD   BP 174/79 mmHg  Pulse 63   Temp(Src) 98.1 F (36.7 C) (Oral)  Resp 11  Ht 6\' 3"  (1.905 m)  Wt 240 lb (108.863 kg)  BMI 30.00 kg/m2  SpO2 95% Physical Exam  Constitutional:  Chronically ill, tachypneic   HENT:  Head: Normocephalic.  Mouth/Throat: Oropharynx is clear and moist.  Eyes: Conjunctivae are normal. Pupils are equal, round, and reactive to light.  Neck: Normal range of motion. Neck supple.  Cardiovascular: Normal rate, regular rhythm and normal heart sounds.   Pulmonary/Chest:  Tachypneic, mild diffuse wheezing, diminished throughout   Abdominal: Soft. Bowel sounds are normal. He exhibits no distension. There is no tenderness. There is no rebound.  Musculoskeletal: Normal range of motion.  1+ edema   Neurological: He is alert.  Skin: Skin is warm and dry.  Psychiatric: He has a normal mood and affect. His behavior is normal. Judgment and thought content normal.  Nursing note and vitals reviewed.   ED Course  Procedures (including critical care time) Labs Review Labs Reviewed  CBC WITH DIFFERENTIAL/PLATELET - Abnormal; Notable for the following:    RBC 3.67 (*)    Hemoglobin 10.0 (*)    HCT 31.6 (*)    MCHC 31.5 (*)    RDW 15.6 (*)    Neutro Abs 7.5 (*)    All other components within normal limits  COMPREHENSIVE METABOLIC PANEL - Abnormal; Notable for the following:    Glucose, Bld 106 (*)    Albumin 3.3 (*)    ALT 13 (*)    All other components within normal limits  TROPONIN I - Abnormal; Notable for the following:    Troponin I 0.26 (*)    All other components within normal limits  BRAIN NATRIURETIC PEPTIDE - Abnormal; Notable for the following:    B Natriuretic Peptide 282.0 (*)    All other components within normal limits  CULTURE, BLOOD (ROUTINE X 2)  CULTURE, BLOOD (ROUTINE X 2)    Imaging Review Dg Chest Port 1 View  07/15/2015   CLINICAL DATA:  Shortness of breath.  Decreased oxygen saturation.  EXAM: PORTABLE CHEST - 1 VIEW  COMPARISON:  01/15/2015  FINDINGS: Patient  rotated minimally right. Midline trachea. Mild cardiomegaly. Atherosclerosis in the transverse aorta. No pleural effusion or pneumothorax. Mild pulmonary venous congestion. Subsegmental atelectasis at the lung bases, especially lateral left lung base.  IMPRESSION: Cardiomegaly with mild pulmonary venous congestion.   Electronically Signed   By: Abigail Miyamoto M.D.   On: 07/15/2015 12:41   I have personally reviewed and evaluated these images and lab results as part of my medical decision-making.   EKG Interpretation None      ED ECG REPORT I, Suzzane Quilter, the attending physician, personally viewed and interpreted this ECG.   Date: 07/15/2015  EKG Time: 11:  50 AM  Rate: 63  Rhythm: normal EKG, normal sinus rhythm  Axis: normal  Intervals:right bundle branch block  ST&T Change: nonspecific           MDM   Final diagnoses:  None    MAEL DELAP is a 80 y.o. male here with SOB, cough. Hypoxic in the ED. Likely COPD vs CHF. Will get labs, BNP, trop, CXR. Will give nebs, solumedrol and will need admission.   3:16 PM EKG unchanged. BNP 280. CXR showed no pulm edema. Trop positive 0.26. Likely demand ischemia. No chest pain. Given asa, nebs, solumedrol. Will admit.       Wandra Arthurs, MD 07/15/15 [phone]

## 2015-07-15 NOTE — ED Notes (Signed)
primedoc in with pt now for admission 

## 2015-07-15 NOTE — H&P (Signed)
[facility] NAME: Justin Leonard    MR#:  [id number]  DATE OF BIRTH:  1927/06/08  DATE OF ADMISSION:  07/15/2015  PRIMARY CARE PHYSICIAN: [name], MD   REQUESTING/REFERRING PHYSICIAN: Wandra Arthurs, MD  CHIEF COMPLAINT:   Chief Complaint  Patient presents with  . Shortness of Breath   cough, shortness of breath and wheezing for 2-3 days.  HISTORY OF PRESENT ILLNESS:  Justin Leonard  is a 81 y.o. male with a known history of COPD, chronic diastolic CHF, CAD, hypertension and diabetes. The patient has had cough, sputum, wheezing and shortness of breath for the past 2-3 days. He was unable to sleep last night due to shortness of breath. He was given nebulizer treatment by EMS but refused transport. He went to see PCP and was found to be hypoxic at 16s. So the patient was sent to the ED for further evaluation. The patient O2 saturation was in the 80s in the ED. He was put on oxygen and nebulizer treatment. The patient denies any fever, chills or chest pain. But the patient has orthopnea, nocturnal dyspnea and leg edema. He was also found to have an elevated troponin at 0.24 and was treated with aspirin 325 by mouth 1 dose in the ED. Chest x-ray show cardiomegaly with mild pulmonary venous congestion.  PAST MEDICAL HISTORY:   Past Medical History  Diagnosis Date  . CHF (congestive heart failure)   . Hypertension   . Diabetes mellitus without complication   . Coronary artery disease   . Hyperlipidemia   . Deafness   . Peripheral neuropathy   . Back pain   . Umbilical hernia   . Depression   . Melena   . COPD (chronic obstructive pulmonary disease)   . Skin cancer     PAST SURGICAL HISTORY:   Past Surgical History  Procedure Laterality Date  . Coronary artery bypass graft      SOCIAL HISTORY:   Social History  Substance Use Topics  . Smoking status: Former Smoker -- 3.00 packs/day for 30 years   Types: Cigarettes    Quit date: 04/16/1978  . Smokeless tobacco: Never Used     Comment: Quit smoking 1979  . Alcohol Use: No    FAMILY HISTORY:  History reviewed. No pertinent family history.  DRUG ALLERGIES:  No Known Allergies  REVIEW OF SYSTEMS:  CONSTITUTIONAL: No fever, has weakness.  EYES: No blurred or double vision.  EARS, NOSE, AND THROAT: No tinnitus or ear pain.  RESPIRATORY: Has cough, shortness of breath, wheezing but no hemoptysis.  CARDIOVASCULAR: No chest pain, but has orthopnea, nocturnal dyspnea and leg edema.  GASTROINTESTINAL: No nausea, vomiting, diarrhea or abdominal pain.  GENITOURINARY: No dysuria, hematuria.  ENDOCRINE: No polyuria, nocturia,  HEMATOLOGY: No anemia, easy bruising or bleeding SKIN: No rash or lesion. MUSCULOSKELETAL: No joint pain or arthritis.   NEUROLOGIC: No tingling, numbness, weakness.  PSYCHIATRY: No anxiety or depression.   MEDICATIONS AT HOME:   Prior to Admission medications   Medication Sig Start Date End Date Taking? Authorizing Provider  acetaminophen (TYLENOL) 325 MG tablet Take 650 mg by mouth every 6 (six) hours as needed.   Yes Historical Provider, MD  amLODipine (NORVASC) 10 MG tablet Take 10 mg by mouth daily.   Yes Historical Provider, MD  aspirin EC 81 MG tablet Take 81 mg by mouth daily.   Yes Historical Provider, MD  FLUoxetine (PROZAC) 20 MG  capsule Take 20 mg by mouth daily.   Yes Historical Provider, MD  furosemide (LASIX) 20 MG tablet Take 20 mg by mouth as needed.   Yes Historical Provider, MD  furosemide (LASIX) 40 MG tablet Take 40 mg by mouth daily.   Yes Historical Provider, MD  glyBURIDE (DIABETA) 5 MG tablet Take 5 mg by mouth 2 (two) times daily with a meal.   Yes Historical Provider, MD  HYDROcodone-acetaminophen (NORCO/VICODIN) 5-325 MG per tablet Take 1 tablet by mouth as needed. 03/10/15 10/25/15 Yes Historical Provider, MD  isosorbide mononitrate (IMDUR) 30 MG 24 hr tablet Take 30 mg by mouth  daily.   Yes Historical Provider, MD  lisinopril (PRINIVIL,ZESTRIL) 40 MG tablet Take 40 mg by mouth daily.   Yes Historical Provider, MD  lovastatin (MEVACOR) 20 MG tablet Take 20 mg by mouth at bedtime.   Yes Historical Provider, MD  metFORMIN (GLUCOPHAGE) 500 MG tablet Take by mouth 3 (three) times daily.   Yes Historical Provider, MD  metoprolol (LOPRESSOR) 50 MG tablet Take 50 mg by mouth 2 (two) times daily.   Yes Historical Provider, MD  nitroGLYCERIN (NITROSTAT) 0.4 MG SL tablet Place 1 tablet under the tongue every 5 (five) minutes as needed. 07/15/15 07/14/16 Yes Historical Provider, MD  tiotropium (SPIRIVA) 18 MCG inhalation capsule Place 18 mcg into inhaler and inhale daily.   Yes Historical Provider, MD  VENTOLIN HFA 108 (90 BASE) MCG/ACT inhaler Inhale 1-2 puffs into the lungs as needed. 06/24/15  Yes Historical Provider, MD  ofloxacin (OCUFLOX) 0.3 % ophthalmic solution Place 1 drop into both eyes 4 (four) times daily.    Historical Provider, MD      VITAL SIGNS:  Blood pressure 135/59, pulse 62, temperature 98.1 F (36.7 C), temperature source Oral, resp. rate 11, height 6\' 3"  (1.905 m), weight 108.863 kg (240 lb), SpO2 96 %.  PHYSICAL EXAMINATION:  GENERAL:  81 y.o.-year-old patient lying in the bed with no acute distress. Obese. EYES: Pupils equal, round, reactive to light and accommodation. No scleral icterus. Extraocular muscles intact.  HEENT: Head atraumatic, normocephalic. Oropharynx and nasopharynx clear.  NECK:  Supple, no jugular venous distention. No thyroid enlargement, no tenderness.  LUNGS: Normal breath sounds bilaterally, no wheezing, but has bilateral basilar rales. No use of accessory muscles of respiration.  CARDIOVASCULAR: S1, S2 normal. No murmurs, rubs, or gallops.  ABDOMEN: Soft, nontender, nondistended. Bowel sounds present. No organomegaly or mass.  EXTREMITIES: Bilateral lower extremity edema 1-2+, no cyanosis, or clubbing.  NEUROLOGIC: Cranial nerves  II through XII are intact. Muscle strength 5/5 in all extremities. Sensation intact. Gait not checked.  PSYCHIATRIC: The patient is alert and oriented x 3.  SKIN: No obvious rash, lesion, or ulcer.   LABORATORY PANEL:   CBC  Recent Labs Lab 07/15/15 1203  WBC 9.6  HGB 10.0*  HCT 31.6*  PLT 271   ------------------------------------------------------------------------------------------------------------------  Chemistries   Recent Labs Lab 07/15/15 1203  NA 140  K 4.8  CL 104  CO2 26  GLUCOSE 106*  BUN 18  CREATININE 0.97  CALCIUM 9.5  AST 23  ALT 13*  ALKPHOS 65  BILITOT 0.9   ------------------------------------------------------------------------------------------------------------------  Cardiac Enzymes  Recent Labs Lab 07/15/15 1203  TROPONINI 0.26*   ------------------------------------------------------------------------------------------------------------------  RADIOLOGY:  Dg Chest Port 1 View  07/15/2015   CLINICAL DATA:  Shortness of breath.  Decreased oxygen saturation.  EXAM: PORTABLE CHEST - 1 VIEW  COMPARISON:  01/15/2015  FINDINGS: Patient rotated minimally right. Midline trachea.  Mild cardiomegaly. Atherosclerosis in the transverse aorta. No pleural effusion or pneumothorax. Mild pulmonary venous congestion. Subsegmental atelectasis at the lung bases, especially lateral left lung base.  IMPRESSION: Cardiomegaly with mild pulmonary venous congestion.   Electronically Signed   By: Abigail Miyamoto M.D.   On: 07/15/2015 12:41    EKG:   Orders placed or performed during the hospital encounter of 07/15/15  . EKG 12-Lead  . EKG 12-Lead    IMPRESSION AND PLAN:   Acute respiratory failure with hypoxia COPD exacerbation Acute on chronic diastolic CHF Elevated troponin, possible due to demanding ischemia Hypertension Diabetes  The patient will be admitted to medical floor with telemetry monitor. I will continue nebulizer treatment and start IV  Solu-Medrol, continue Spiriva. For acute on chronic diastolic CHF, I will increase Lasix to 40 mg IV twice a day and a follow-up BMP. Elevated troponin is possibly due to demanding ischemia. I will follow-up troponin level, increase aspirin to 325 mg by mouth daily and statin,  and get cardiology consult. Continue hypertension medication. Start Sliding scale.   All the records are reviewed and case discussed with ED provider. Management plans discussed with the patient, his wife and son and they are in agreement.  CODE STATUS: Full code  TOTAL TIME TAKING CARE OF THIS PATIENT: 58 minutes.    Demetrios Loll M.D on 07/15/2015 at 4:01 PM  Between 7am to 6pm - Pager - [phone]  After 6pm go to [email or web address] - password [email or web address] [facility]  Office  [phone]  CC: Primary care physician; [name], MD

## 2015-07-15 NOTE — ED Notes (Signed)
Pt from PCP office. He began having SOB last night and called ems. Felt better after breathing treatment and refused transport, agreeing to follow up with pcp today. When he went to pcp office, he was satting 70's on RA, so they called ems. EMS put him on 2L and sat increased to 91%. Pt alert & oriented with warm, dry skin. NAD noted.

## 2015-07-15 NOTE — Progress Notes (Addendum)
Patient sitting up in bed, no distress or pain. Family members at bedside. Skin verified by C Montelongo

## 2015-07-15 NOTE — ED Notes (Signed)
Resumed care from [facility] rn. Pt alert.  Family with pt.  Pt waiting on admission.

## 2015-07-16 ENCOUNTER — Inpatient Hospital Stay
Admit: 2015-07-16 | Discharge: 2015-07-16 | Disposition: A | Discharge status: Home / Self Care | Payer: Medicare Other | Attending: Physician Assistant | Admitting: Physician Assistant

## 2015-07-16 LAB — CBC
HEMATOCRIT: 30.2 % — AB (ref 40.0–52.0)
Hemoglobin: 9.5 g/dL — ABNORMAL LOW (ref 13.0–18.0)
MCH: 26.9 pg (ref 26.0–34.0)
MCHC: 31.3 g/dL — ABNORMAL LOW (ref 32.0–36.0)
MCV: 85.9 fL (ref 80.0–100.0)
PLATELETS: 246 10*3/uL (ref 150–440)
RBC: 3.52 MIL/uL — AB (ref 4.40–5.90)
RDW: 15.3 % — ABNORMAL HIGH (ref 11.5–14.5)
WBC: 6 10*3/uL (ref 3.8–10.6)

## 2015-07-16 LAB — BASIC METABOLIC PANEL
Anion gap: 6 (ref 5–15)
BUN: 27 mg/dL — ABNORMAL HIGH (ref 6–20)
CHLORIDE: 104 mmol/L (ref 101–111)
CO2: 28 mmol/L (ref 22–32)
Calcium: 9.3 mg/dL (ref 8.9–10.3)
Creatinine, Ser: 1.08 mg/dL (ref 0.61–1.24)
Glucose, Bld: 315 mg/dL — ABNORMAL HIGH (ref 65–99)
POTASSIUM: 4.6 mmol/L (ref 3.5–5.1)
SODIUM: 138 mmol/L (ref 135–145)

## 2015-07-16 LAB — HEMOGLOBIN A1C: Hgb A1c MFr Bld: 5.9 % (ref 4.0–6.0)

## 2015-07-16 LAB — GLUCOSE, CAPILLARY
GLUCOSE-CAPILLARY: 307 mg/dL — AB (ref 65–99)
GLUCOSE-CAPILLARY: 313 mg/dL — AB (ref 65–99)
GLUCOSE-CAPILLARY: 313 mg/dL — AB (ref 65–99)
Glucose-Capillary: 292 mg/dL — ABNORMAL HIGH (ref 65–99)

## 2015-07-16 LAB — TROPONIN I: TROPONIN I: 0.09 ng/mL — AB (ref <0.031)

## 2015-07-16 MED ORDER — BUDESONIDE 0.25 MG/2ML IN SUSP
0.2500 mg | Freq: Two times a day (BID) | RESPIRATORY_TRACT | Status: DC
  Administered 2015-07-17 – 2015-07-18 (×2): 0.25 mg via RESPIRATORY_TRACT
  Filled 2015-07-16 (×2): qty 2

## 2015-07-16 MED ORDER — INSULIN ASPART 100 UNIT/ML ~~LOC~~ SOLN
0.0000 [IU] | Freq: Three times a day (TID) | SUBCUTANEOUS | Status: DC
  Administered 2015-07-16 – 2015-07-17 (×2): 15 [IU] via SUBCUTANEOUS
  Administered 2015-07-17: 20 [IU] via SUBCUTANEOUS
  Administered 2015-07-18: 11 [IU] via SUBCUTANEOUS
  Administered 2015-07-18: 15 [IU] via SUBCUTANEOUS
  Filled 2015-07-16: qty 15
  Filled 2015-07-16: qty 11
  Filled 2015-07-16: qty 15
  Filled 2015-07-16: qty 20
  Filled 2015-07-16: qty 15

## 2015-07-16 MED ORDER — INSULIN ASPART 100 UNIT/ML ~~LOC~~ SOLN
15.0000 [IU] | Freq: Once | SUBCUTANEOUS | Status: AC
  Administered 2015-07-16: 15 [IU] via SUBCUTANEOUS
  Filled 2015-07-16: qty 15

## 2015-07-16 NOTE — Progress Notes (Signed)
*  PRELIMINARY RESULTS* Echocardiogram 2D Echocardiogram has been performed.  Justin Leonard 07/16/2015, 12:06 PM

## 2015-07-16 NOTE — Progress Notes (Signed)
Notified Dr Fritzi Mandes of patient's blood sugar of 313 with no night time coverage.  Telephone order received.  Will continue to monitor.  Lynnda Shields, RN

## 2015-07-16 NOTE — Consult Note (Signed)
Primary Cardiologist: [facility]      Reason for Consultation : CP, SOB, elevated troponin   HPI : This is a 81 yo male with PMHx CAD s/p 3V CABG, diastolic CHF, COPD, DM, HTN, HL who presented to ER yesterday with CP and SOB x 1 day. He states he noticed SOB and chest tightness 2 nights ago, took 2 doses of SL nitro and went to bed, he awoke 45 minutes later with continued SOB and CP, took another 2 doses of SL nitro and pain was improved. He went to PCP the next morning who sent pt to [facility] ER. He states SOB and CP is still present today but improved.         Review of Systems: General: negative for chills, fever, night sweats or weight changes.  Cardiovascular: positive for chest pain, shortness of breath and dyspnea on exertion. negative for edema, orthopnea, palpitations, paroxysmal nocturnal dyspnea,  Dermatological: negative for rash Respiratory: negative for cough or wheezing Urologic: negative for hematuria Abdominal: negative for nausea, vomiting, diarrhea, bright red blood per rectum, melena, or hematemesis Neurologic: negative for visual changes, syncope, or dizziness All other systems reviewed and are otherwise negative except as noted above.    Past Medical History  Diagnosis Date  . CHF (congestive heart failure)   . Hypertension   . Diabetes mellitus without complication   . Coronary artery disease   . Hyperlipidemia   . Deafness   . Peripheral neuropathy   . Back pain   . Umbilical hernia   . Depression   . Melena   . COPD (chronic obstructive pulmonary disease)   . Skin cancer     Medications Prior to Admission  Medication Sig Dispense Refill  . acetaminophen (TYLENOL) 325 MG tablet Take 650 mg by mouth every 6 (six) hours as needed.    Marland Kitchen amLODipine (NORVASC) 10 MG tablet Take 10 mg by mouth daily.    Marland Kitchen aspirin EC 81 MG tablet Take 81 mg by mouth daily.    Marland Kitchen FLUoxetine (PROZAC) 20 MG capsule Take 20 mg by mouth daily.    . furosemide (LASIX) 20  MG tablet Take 20 mg by mouth as needed.    . furosemide (LASIX) 40 MG tablet Take 40 mg by mouth daily.    Marland Kitchen glyBURIDE (DIABETA) 5 MG tablet Take 5 mg by mouth 2 (two) times daily with a meal.    . HYDROcodone-acetaminophen (NORCO/VICODIN) 5-325 MG per tablet Take 1 tablet by mouth as needed.    . isosorbide mononitrate (IMDUR) 30 MG 24 hr tablet Take 30 mg by mouth daily.    Marland Kitchen lisinopril (PRINIVIL,ZESTRIL) 40 MG tablet Take 40 mg by mouth daily.    Marland Kitchen lovastatin (MEVACOR) 20 MG tablet Take 20 mg by mouth at bedtime.    . metFORMIN (GLUCOPHAGE) 500 MG tablet Take by mouth 3 (three) times daily.    . metoprolol (LOPRESSOR) 50 MG tablet Take 50 mg by mouth 2 (two) times daily.    . nitroGLYCERIN (NITROSTAT) 0.4 MG SL tablet Place 1 tablet under the tongue every 5 (five) minutes as needed.    . tiotropium (SPIRIVA) 18 MCG inhalation capsule Place 18 mcg into inhaler and inhale daily.    . VENTOLIN HFA 108 (90 BASE) MCG/ACT inhaler Inhale 1-2 puffs into the lungs as needed.    Marland Kitchen ofloxacin (OCUFLOX) 0.3 % ophthalmic solution Place 1 drop into both eyes 4 (four) times daily.       Marland Kitchen  amLODipine  10 mg Oral Daily  . aspirin EC  325 mg Oral Daily  . FLUoxetine  20 mg Oral Daily  . furosemide  40 mg Intravenous Q12H  . heparin  5,000 Units Subcutaneous 3 times per day  . insulin aspart  0-5 Units Subcutaneous QHS  . insulin aspart  0-9 Units Subcutaneous TID WC  . isosorbide mononitrate  30 mg Oral Daily  . lisinopril  40 mg Oral Daily  . methylPREDNISolone (SOLU-MEDROL) injection  60 mg Intravenous Q6H  . metoprolol  50 mg Oral BID  . pravastatin  20 mg Oral q1800  . sodium chloride  3 mL Intravenous Q12H  . tiotropium  18 mcg Inhalation Daily    Infusions:    No Known Allergies  Social History   Social History  . Marital Status: Married    Spouse Name: N/A  . Number of Children: N/A  . Years of Education: N/A   Occupational History  . Not on file.   Social History Main Topics   . Smoking status: Former Smoker -- 3.00 packs/day for 30 years    Types: Cigarettes    Quit date: 04/16/1978  . Smokeless tobacco: Never Used     Comment: Quit smoking 1979  . Alcohol Use: No  . Drug Use: No  . Sexual Activity: Not on file   Other Topics Concern  . Not on file   Social History Narrative    History reviewed. No pertinent family history.  PHYSICAL EXAM: Filed Vitals:   07/16/15 0445  BP: 144/64  Pulse: 68  Temp: 97.7 F (36.5 C)  Resp: 22     Intake/Output Summary (Last 24 hours) at 07/16/15 0925 Last data filed at 07/15/15 2315  Gross per 24 hour  Intake      3 ml  Output    725 ml  Net   -722 ml    General:  Well appearing. No respiratory difficulty HEENT: normal Neck: supple. no JVD. Carotids 2+ bilat; no bruits. No lymphadenopathy or thryomegaly appreciated. Cor: PMI nondisplaced. Regular rate & rhythm. No rubs, gallops or murmurs. Lungs: clear Abdomen: soft, nontender, nondistended. No hepatosplenomegaly. No bruits or masses. Good bowel sounds. Extremities: no cyanosis, clubbing, rash, edema Neuro: alert & oriented x 3, cranial nerves grossly intact. moves all 4 extremities w/o difficulty. Affect pleasant.  ECG: AV junctional rhythm 63 BPM RBBB NS ST/T changes   Results for orders placed or performed during the hospital encounter of 07/15/15 (from the past 24 hour(s))  CBC with Differential/Platelet     Status: Abnormal   Collection Time: 07/15/15 12:03 PM  Result Value Ref Range   WBC 9.6 3.8 - 10.6 K/uL   RBC 3.67 (L) 4.40 - 5.90 MIL/uL   Hemoglobin 10.0 (L) 13.0 - 18.0 g/dL   HCT 31.6 (L) 40.0 - 52.0 %   MCV 86.2 80.0 - 100.0 fL   MCH 27.1 26.0 - 34.0 pg   MCHC 31.5 (L) 32.0 - 36.0 g/dL   RDW 15.6 (H) 11.5 - 14.5 %   Platelets 271 150 - 440 K/uL   Neutrophils Relative % 78 %   Neutro Abs 7.5 (H) 1.4 - 6.5 K/uL   Lymphocytes Relative 11 %   Lymphs Abs 1.1 1.0 - 3.6 K/uL   Monocytes Relative 8 %   Monocytes Absolute 0.8 0.2 - 1.0  K/uL   Eosinophils Relative 2 %   Eosinophils Absolute 0.2 0 - 0.7 K/uL   Basophils Relative 1 %  Basophils Absolute 0.1 0 - 0.1 K/uL  Comprehensive metabolic panel     Status: Abnormal   Collection Time: 07/15/15 12:03 PM  Result Value Ref Range   Sodium 140 135 - 145 mmol/L   Potassium 4.8 3.5 - 5.1 mmol/L   Chloride 104 101 - 111 mmol/L   CO2 26 22 - 32 mmol/L   Glucose, Bld 106 (H) 65 - 99 mg/dL   BUN 18 6 - 20 mg/dL   Creatinine, Ser 0.97 0.61 - 1.24 mg/dL   Calcium 9.5 8.9 - 10.3 mg/dL   Total Protein 6.6 6.5 - 8.1 g/dL   Albumin 3.3 (L) 3.5 - 5.0 g/dL   AST 23 15 - 41 U/L   ALT 13 (L) 17 - 63 U/L   Alkaline Phosphatase 65 38 - 126 U/L   Total Bilirubin 0.9 0.3 - 1.2 mg/dL   GFR calc non Af Amer >60 >60 mL/min   GFR calc Af Amer >60 >60 mL/min   Anion gap 10 5 - 15  Troponin I     Status: Abnormal   Collection Time: 07/15/15 12:03 PM  Result Value Ref Range   Troponin I 0.26 (H) <0.031 ng/mL  Brain natriuretic peptide     Status: Abnormal   Collection Time: 07/15/15 12:03 PM  Result Value Ref Range   B Natriuretic Peptide 282.0 (H) 0.0 - 100.0 pg/mL  Blood culture (routine x 2)     Status: None (Preliminary result)   Collection Time: 07/15/15 12:03 PM  Result Value Ref Range   Specimen Description BLOOD LEFT HAND    Special Requests      BOTTLES DRAWN AEROBIC AND ANAEROBIC 2CC AEROBIC,2CC ANAEROBIC   Culture NO GROWTH < 24 HOURS    Report Status PENDING   Blood culture (routine x 2)     Status: None (Preliminary result)   Collection Time: 07/15/15 12:10 PM  Result Value Ref Range   Specimen Description BLOOD RIGHT ASSIST CONTROL    Special Requests      BOTTLES DRAWN AEROBIC AND ANAEROBIC 3CC AEROBIC,3CC ANAEROBIC   Culture NO GROWTH < 24 HOURS    Report Status PENDING   Hemoglobin A1c     Status: None   Collection Time: 07/15/15  5:21 PM  Result Value Ref Range   Hgb A1c MFr Bld 5.9 4.0 - 6.0 %  Magnesium     Status: None   Collection Time: 07/15/15  5:21  PM  Result Value Ref Range   Magnesium 2.1 1.7 - 2.4 mg/dL  Troponin I (q 6hr x 3)     Status: Abnormal   Collection Time: 07/15/15  5:21 PM  Result Value Ref Range   Troponin I 0.21 (H) <0.031 ng/mL  Glucose, capillary     Status: Abnormal   Collection Time: 07/15/15  9:04 PM  Result Value Ref Range   Glucose-Capillary 372 (H) 65 - 99 mg/dL  Basic metabolic panel     Status: Abnormal   Collection Time: 07/16/15  4:41 AM  Result Value Ref Range   Sodium 138 135 - 145 mmol/L   Potassium 4.6 3.5 - 5.1 mmol/L   Chloride 104 101 - 111 mmol/L   CO2 28 22 - 32 mmol/L   Glucose, Bld 315 (H) 65 - 99 mg/dL   BUN 27 (H) 6 - 20 mg/dL   Creatinine, Ser 1.08 0.61 - 1.24 mg/dL   Calcium 9.3 8.9 - 10.3 mg/dL   GFR calc non Af Amer >60 >60 mL/min  GFR calc Af Amer >60 >60 mL/min   Anion gap 6 5 - 15  CBC     Status: Abnormal   Collection Time: 07/16/15  4:41 AM  Result Value Ref Range   WBC 6.0 3.8 - 10.6 K/uL   RBC 3.52 (L) 4.40 - 5.90 MIL/uL   Hemoglobin 9.5 (L) 13.0 - 18.0 g/dL   HCT 30.2 (L) 40.0 - 52.0 %   MCV 85.9 80.0 - 100.0 fL   MCH 26.9 26.0 - 34.0 pg   MCHC 31.3 (L) 32.0 - 36.0 g/dL   RDW 15.3 (H) 11.5 - 14.5 %   Platelets 246 150 - 440 K/uL  Troponin I (q 6hr x 3)     Status: Abnormal   Collection Time: 07/16/15  4:41 AM  Result Value Ref Range   Troponin I 0.09 (H) <0.031 ng/mL  Glucose, capillary     Status: Abnormal   Collection Time: 07/16/15  7:15 AM  Result Value Ref Range   Glucose-Capillary 292 (H) 65 - 99 mg/dL   Comment 1 Notify RN    Dg Chest Port 1 View  07/15/2015   CLINICAL DATA:  Shortness of breath.  Decreased oxygen saturation.  EXAM: PORTABLE CHEST - 1 VIEW  COMPARISON:  01/15/2015  FINDINGS: Patient rotated minimally right. Midline trachea. Mild cardiomegaly. Atherosclerosis in the transverse aorta. No pleural effusion or pneumothorax. Mild pulmonary venous congestion. Subsegmental atelectasis at the lung bases, especially lateral left lung base.   IMPRESSION: Cardiomegaly with mild pulmonary venous congestion.   Electronically Signed   By: Abigail Miyamoto M.D.   On: 07/15/2015 12:41     ASSESSMENT: Mildly elevated troponin like 2/2 demand ischemia   PLAN/DISCUSSION: Pt with hx of 3v CABG, classic angina per pt, CP and SOB still present although improved. Advise checking echo today to evaluate WM and LVEF.    Patient and plan discussed with supervising provider, Dr. [name], who agrees with above findings.   Kelby Fam [place], [place] 07/16/2015 9:25 AM

## 2015-07-16 NOTE — Care Management CHF Note (Signed)
Patient presents from home with shortness of breath.  Prior to this admission , he was independent in all adls and able to drives.  PCP is Dr Kym Groom at [facility].  Denies problems with transportation or accessing medical care and medications.  He does not have home 02.  Says that chf is a new diagnosis but is documented in the h/p as a past medical history diagnosis.  Patient says he has plenty of walkers, canes and a walker with a seat.  Referral to heart failure clinic.

## 2015-07-16 NOTE — Progress Notes (Addendum)
Inpatient Diabetes Program Recommendations  AACE/ADA: New Consensus Statement on Inpatient Glycemic Control (2013)  Target Ranges:  Prepandial:   less than 140 mg/dL      Peak postprandial:   less than 180 mg/dL (1-2 hours)      Critically ill patients:  140 - 180 mg/dL   Results for Justin Leonard, Justin Leonard (MRN [id number]) as of 07/16/2015 11:39  Ref. Range 07/15/2015 21:04 07/16/2015 07:15 07/16/2015 11:07  Glucose-Capillary Latest Ref Range: 65-99 mg/dL 372 (H) 292 (H) 307 (H)  Results for Justin Leonard, Justin Leonard (MRN [id number]) as of 07/16/2015 11:39  Ref. Range 07/15/2015 17:21  Hemoglobin A1C Latest Ref Range: 4.0-6.0 % 5.9   Diabetes history: Type 2 diabetes Outpatient Diabetes medications: Diabeta 5 mg bid, Metformin 500 mg tid with meals Current orders for Inpatient glycemic control:  Novolog sensitive tid with meals and HS  Note that CBG's elevated with steroids.  Consider increasing Novolog correction to resistant tid with meals while on steroids.  If CBG's remain elevated, may consider adding basal insulin such as Levemir 15 units daily (0.15 units/kg). However note that as steroids decreased, insulin will also need to be decreased.  Based on A1C, patient does not need insulin at home.  Adah Perl, RN, BC-ADM Inpatient Diabetes Coordinator Pager [phone] (8a-5p)

## 2015-07-16 NOTE — Progress Notes (Signed)
[place] at [facility]                                                                                                                                                                                            Patient Demographics   Justin Leonard, is a 81 y.o. male, DOB - [birth year], FAO:[id number]  Admit date - 07/15/2015   Admitting Physician Demetrios Loll, MD  Outpatient Primary MD for the patient is Olmedo, Guy Begin, MD   LOS - 1  Subjective: Patient admitted with shortness of breath continues to have shortness of breath but improved compared to yesterday denies any chest pain. Not on any oxygen at home     Review of Systems:   CONSTITUTIONAL: No documented fever. No fatigue, weakness. No weight gain, no weight loss.  EYES: No blurry or double vision.  ENT: No tinnitus. No postnasal drip. No redness of the oropharynx.  RESPIRATORY: No cough, no wheeze, no hemoptysis. Positive dyspnea.  CARDIOVASCULAR: No chest pain. No orthopnea. No palpitations. No syncope.  GASTROINTESTINAL: No nausea, no vomiting or diarrhea. No abdominal pain. No melena or hematochezia.  GENITOURINARY: No dysuria or hematuria.  ENDOCRINE: No polyuria or nocturia. No heat or cold intolerance.  HEMATOLOGY: No anemia. No bruising. No bleeding.  INTEGUMENTARY: No rashes. No lesions.  MUSCULOSKELETAL: No arthritis. No swelling. No gout.  NEUROLOGIC: No numbness, tingling, or ataxia. No seizure-type activity.  PSYCHIATRIC: No anxiety. No insomnia. No ADD.    Vitals:   Filed Vitals:   07/16/15 0447 07/16/15 0900 07/16/15 1109 07/16/15 1110  BP:  145/57 140/60   Pulse:  63 67   Temp:  97.7 F (36.5 C) 97.9 F (36.6 C)   TempSrc:  Oral Oral   Resp:  18 20   Height:      Weight: 108.818 kg (239 lb 14.4 oz)     SpO2:  96% 97% 96%    Wt Readings from Last 3 Encounters:  07/16/15 108.818 kg (239 lb 14.4 oz)  02/05/15 113.399 kg (250 lb)     Intake/Output Summary  (Last 24 hours) at 07/16/15 1429 Last data filed at 07/16/15 1209  Gross per 24 hour  Intake    483 ml  Output   1175 ml  Net   -692 ml    Physical Exam:   GENERAL: Pleasant-appearing in no apparent distress.  HEAD, EYES, EARS, NOSE AND THROAT: Atraumatic, normocephalic. Extraocular muscles are intact. Pupils equal and reactive to light. Sclerae anicteric. No conjunctival injection. No oro-pharyngeal erythema.  NECK: Supple. There is no jugular venous distention. No bruits, no lymphadenopathy, no  thyromegaly.  HEART: Regular rate and rhythm,. No murmurs, no rubs, no clicks.  LUNGS: Bilateral wheezing throughout both lung as well as crackles at the bases.  ABDOMEN: Soft, flat, nontender, nondistended. Has good bowel sounds. No hepatosplenomegaly appreciated.  EXTREMITIES: No evidence of any cyanosis, clubbing, or peripheral edema.  +2 pedal and radial pulses bilaterally.  NEUROLOGIC: The patient is alert, awake, and oriented x3 with no focal motor or sensory deficits appreciated bilaterally.  SKIN: Moist and warm with no rashes appreciated.  Psych: Not anxious, depressed LN: No inguinal LN enlargement    Antibiotics   Anti-infectives    None      Medications   Scheduled Meds: . amLODipine  10 mg Oral Daily  . aspirin EC  325 mg Oral Daily  . budesonide (PULMICORT) nebulizer solution  0.25 mg Nebulization BID  . FLUoxetine  20 mg Oral Daily  . furosemide  40 mg Intravenous Q12H  . heparin  5,000 Units Subcutaneous 3 times per day  . insulin aspart  0-20 Units Subcutaneous TID WC  . isosorbide mononitrate  30 mg Oral Daily  . lisinopril  40 mg Oral Daily  . methylPREDNISolone (SOLU-MEDROL) injection  60 mg Intravenous Q6H  . metoprolol  50 mg Oral BID  . pravastatin  20 mg Oral q1800  . sodium chloride  3 mL Intravenous Q12H  . tiotropium  18 mcg Inhalation Daily   Continuous Infusions:  PRN Meds:.sodium chloride, acetaminophen **OR** acetaminophen, albuterol,  HYDROcodone-acetaminophen, nitroGLYCERIN, ondansetron **OR** ondansetron (ZOFRAN) IV, sodium chloride   Data Review:   Micro Results Recent Results (from the past 240 hour(s))  Blood culture (routine x 2)     Status: None (Preliminary result)   Collection Time: 07/15/15 12:03 PM  Result Value Ref Range Status   Specimen Description BLOOD LEFT HAND  Final   Special Requests   Final    BOTTLES DRAWN AEROBIC AND ANAEROBIC 2CC AEROBIC,2CC ANAEROBIC   Culture NO GROWTH < 24 HOURS  Final   Report Status PENDING  Incomplete  Blood culture (routine x 2)     Status: None (Preliminary result)   Collection Time: 07/15/15 12:10 PM  Result Value Ref Range Status   Specimen Description BLOOD RIGHT ASSIST CONTROL  Final   Special Requests   Final    BOTTLES DRAWN AEROBIC AND ANAEROBIC 3CC AEROBIC,3CC ANAEROBIC   Culture NO GROWTH < 24 HOURS  Final   Report Status PENDING  Incomplete    Radiology Reports Dg Chest Port 1 View  07/15/2015   CLINICAL DATA:  Shortness of breath.  Decreased oxygen saturation.  EXAM: PORTABLE CHEST - 1 VIEW  COMPARISON:  01/15/2015  FINDINGS: Patient rotated minimally right. Midline trachea. Mild cardiomegaly. Atherosclerosis in the transverse aorta. No pleural effusion or pneumothorax. Mild pulmonary venous congestion. Subsegmental atelectasis at the lung bases, especially lateral left lung base.  IMPRESSION: Cardiomegaly with mild pulmonary venous congestion.   Electronically Signed   By: Abigail Miyamoto M.D.   On: 07/15/2015 12:41     CBC  Recent Labs Lab 07/15/15 1203 07/16/15 0441  WBC 9.6 6.0  HGB 10.0* 9.5*  HCT 31.6* 30.2*  PLT 271 246  MCV 86.2 85.9  MCH 27.1 26.9  MCHC 31.5* 31.3*  RDW 15.6* 15.3*  LYMPHSABS 1.1  --   MONOABS 0.8  --   EOSABS 0.2  --   BASOSABS 0.1  --     Chemistries   Recent Labs Lab 07/15/15 1203 07/15/15 1721 07/16/15 0441  NA 140  --  138  K 4.8  --  4.6  CL 104  --  104  CO2 26  --  28  GLUCOSE 106*  --  315*  BUN  18  --  27*  CREATININE 0.97  --  1.08  CALCIUM 9.5  --  9.3  MG  --  2.1  --   AST 23  --   --   ALT 13*  --   --   ALKPHOS 65  --   --   BILITOT 0.9  --   --    ------------------------------------------------------------------------------------------------------------------ estimated creatinine clearance is 65.4 mL/min (by C-G formula based on Cr of 1.08). ------------------------------------------------------------------------------------------------------------------  Recent Labs  07/15/15 1721  HGBA1C 5.9   ------------------------------------------------------------------------------------------------------------------ No results for input(s): CHOL, HDL, LDLCALC, TRIG, CHOLHDL, LDLDIRECT in the last 72 hours. ------------------------------------------------------------------------------------------------------------------ No results for input(s): TSH, T4TOTAL, T3FREE, THYROIDAB in the last 72 hours.  Invalid input(s): FREET3 ------------------------------------------------------------------------------------------------------------------ No results for input(s): VITAMINB12, FOLATE, FERRITIN, TIBC, IRON, RETICCTPCT in the last 72 hours.  Coagulation profile No results for input(s): INR, PROTIME in the last 168 hours.  No results for input(s): DDIMER in the last 72 hours.  Cardiac Enzymes  Recent Labs Lab 07/15/15 1203 07/15/15 1721 07/16/15 0441  TROPONINI 0.26* 0.21* 0.09*   ------------------------------------------------------------------------------------------------------------------ Invalid input(s): POCBNP    Assessment & Plan    1.  Acute respiratory failure with hypoxia: Due to combination of acute on chronic diastolic CHF as well as acute on chronic COPD exasperation , continue treatment with IV Lasix , as well as nebulizers , steroids  2. Acute on chronic   COPD exacerbation Continue treatment with Solu-Medrol, nebulizers and Spiriva add budesonide to  his current treatment  3.  Acute on chronic diastolic CHF (congestive heart failure)Continue IV Lasix  4.  Elevated troponinDue to demand ischemia await results of echo  5.  DMII Due to his steroids blood glucose will be elevated sliding scale would change to resistant dose   6. CADContinue aspirin and isosorbide mononitrate  7.  Hyperlipdemia continue Pravachol        Code Status Orders        Start     Ordered   07/15/15 1704  Full code   Continuous     07/15/15 1704           Consults none   DVT Prophylaxisheparin  Lab Results  Component Value Date   PLT 246 07/16/2015     Time Spent in minutes  35 minutes     Dustin Flock M.D on 07/16/2015 at 2:29 PM  Between 7am to 6pm - Pager - [phone]  After 6pm go to [email or web address] - password [email or web address] [facility]   Office  [phone]

## 2015-07-17 ENCOUNTER — Encounter
Admission: EM | Disposition: A | Discharge status: Home Health | Payer: Self-pay | Source: Home / Self Care | Attending: Internal Medicine

## 2015-07-17 HISTORY — PX: CARDIAC CATHETERIZATION: SHX172

## 2015-07-17 LAB — GLUCOSE, CAPILLARY: GLUCOSE-CAPILLARY: 343 mg/dL — AB (ref 65–99)

## 2015-07-17 SURGERY — RIGHT/LEFT HEART CATH AND CORONARY ANGIOGRAPHY
Anesthesia: Moderate Sedation

## 2015-07-17 MED ORDER — SODIUM CHLORIDE 0.9 % IJ SOLN: 3.0000 mL | Freq: Two times a day (BID) | INTRAMUSCULAR | Status: DC

## 2015-07-17 MED ORDER — SODIUM CHLORIDE 0.9 % IV SOLN: 250.0000 mL | INTRAVENOUS | Status: DC | PRN

## 2015-07-17 MED ORDER — ONDANSETRON HCL 4 MG/2ML IJ SOLN: 4.0000 mg | Freq: Four times a day (QID) | INTRAMUSCULAR | Status: DC | PRN

## 2015-07-17 MED ORDER — ASPIRIN 81 MG PO CHEW
CHEWABLE_TABLET | ORAL | Status: DC | PRN
  Administered 2015-07-17: 324 mg via ORAL

## 2015-07-17 MED ORDER — ACETAMINOPHEN 325 MG PO TABS: 650.0000 mg | ORAL_TABLET | ORAL | Status: DC | PRN

## 2015-07-17 MED ORDER — BIVALIRUDIN 250 MG IV SOLR
250.0000 mg | INTRAVENOUS | Status: DC | PRN
  Administered 2015-07-17: 1.75 mg/kg/h via INTRAVENOUS

## 2015-07-17 MED ORDER — TICAGRELOR 90 MG PO TABS
90.0000 mg | ORAL_TABLET | Freq: Two times a day (BID) | ORAL | Status: DC
  Administered 2015-07-17 – 2015-07-18 (×2): 90 mg via ORAL
  Filled 2015-07-17 (×2): qty 1

## 2015-07-17 MED ORDER — SODIUM CHLORIDE 0.9 % WEIGHT BASED INFUSION: 3.0000 mL/kg/h | INTRAVENOUS | Status: DC

## 2015-07-17 MED ORDER — SODIUM CHLORIDE 0.9 % IJ SOLN: 3.0000 mL | INTRAMUSCULAR | Status: DC | PRN

## 2015-07-17 MED ORDER — ASPIRIN 81 MG PO CHEW
CHEWABLE_TABLET | ORAL | Status: AC
  Filled 2015-07-17: qty 4

## 2015-07-17 MED ORDER — SODIUM CHLORIDE 0.9 % IV SOLN
250.0000 mL | INTRAVENOUS | Status: DC | PRN
  Administered 2015-07-17: 250 mL via INTRAVENOUS

## 2015-07-17 MED ORDER — FENTANYL CITRATE (PF) 100 MCG/2ML IJ SOLN
INTRAMUSCULAR | Status: AC
  Filled 2015-07-17: qty 2

## 2015-07-17 MED ORDER — IOHEXOL 300 MG/ML  SOLN
INTRAMUSCULAR | Status: DC | PRN
  Administered 2015-07-17: 75 mL via INTRA_ARTERIAL
  Administered 2015-07-17: 30 mL via INTRA_ARTERIAL
  Administered 2015-07-17: 100 mL via INTRA_ARTERIAL

## 2015-07-17 MED ORDER — MIDAZOLAM HCL 2 MG/2ML IJ SOLN
INTRAMUSCULAR | Status: AC
  Filled 2015-07-17: qty 2

## 2015-07-17 MED ORDER — SODIUM CHLORIDE 0.9 % WEIGHT BASED INFUSION: 1.0000 mL/kg/h | INTRAVENOUS | Status: DC

## 2015-07-17 MED ORDER — IOHEXOL 300 MG/ML  SOLN
INTRAMUSCULAR | Status: DC | PRN
  Administered 2015-07-17: 70 mL via INTRAVENOUS
  Administered 2015-07-17: 80 mL via INTRAVENOUS

## 2015-07-17 MED ORDER — TICAGRELOR 90 MG PO TABS
ORAL_TABLET | ORAL | Status: AC
  Filled 2015-07-17: qty 2

## 2015-07-17 MED ORDER — BIVALIRUDIN 250 MG IV SOLR
INTRAVENOUS | Status: AC
  Filled 2015-07-17: qty 250

## 2015-07-17 MED ORDER — TICAGRELOR 90 MG PO TABS
ORAL_TABLET | ORAL | Status: DC | PRN
  Administered 2015-07-17: 180 mg via ORAL

## 2015-07-17 MED ORDER — BIVALIRUDIN BOLUS VIA INFUSION - CUPID
INTRAVENOUS | Status: DC | PRN
  Administered 2015-07-17: 83.1 mg via INTRAVENOUS

## 2015-07-17 MED ORDER — ACETAMINOPHEN 325 MG PO TABS
650.0000 mg | ORAL_TABLET | ORAL | Status: DC | PRN
  Filled 2015-07-17: qty 2

## 2015-07-17 MED ORDER — FENTANYL CITRATE (PF) 100 MCG/2ML IJ SOLN
INTRAMUSCULAR | Status: DC | PRN
  Administered 2015-07-17: 25 ug via INTRAVENOUS
  Administered 2015-07-17: 50 ug via INTRAVENOUS
  Administered 2015-07-17: 25 ug via INTRAVENOUS

## 2015-07-17 MED ORDER — MIDAZOLAM HCL 2 MG/2ML IJ SOLN
INTRAMUSCULAR | Status: DC | PRN
  Administered 2015-07-17 (×2): 0.5 mg via INTRAVENOUS
  Administered 2015-07-17: 1 mg via INTRAVENOUS

## 2015-07-17 MED ORDER — ASPIRIN 81 MG PO CHEW
81.0000 mg | CHEWABLE_TABLET | ORAL | Status: AC
  Administered 2015-07-18: 81 mg via ORAL
  Filled 2015-07-17: qty 1

## 2015-07-17 MED ORDER — INSULIN GLARGINE 100 UNIT/ML ~~LOC~~ SOLN
15.0000 [IU] | Freq: Every day | SUBCUTANEOUS | Status: DC
  Administered 2015-07-17: 15 [IU] via SUBCUTANEOUS
  Filled 2015-07-17 (×2): qty 0.15

## 2015-07-17 MED ORDER — NITROGLYCERIN 5 MG/ML IV SOLN
INTRAVENOUS | Status: AC
  Filled 2015-07-17: qty 10

## 2015-07-17 MED ORDER — ASPIRIN 81 MG PO CHEW
81.0000 mg | CHEWABLE_TABLET | Freq: Every day | ORAL | Status: DC
  Filled 2015-07-17: qty 1

## 2015-07-17 MED ORDER — METHYLPREDNISOLONE SODIUM SUCC 125 MG IJ SOLR
60.0000 mg | Freq: Two times a day (BID) | INTRAMUSCULAR | Status: DC
  Administered 2015-07-17 – 2015-07-18 (×2): 60 mg via INTRAVENOUS
  Filled 2015-07-17 (×2): qty 2

## 2015-07-17 SURGICAL SUPPLY — 23 items
BALLN TREK RX 2.5X20 (BALLOONS) ×4
BALLOON TREK RX 2.5X20 (BALLOONS) ×2 IMPLANT
CATH ANGIO 5F JB2 100CM (CATHETERS) ×4 IMPLANT
CATH INFINITI 5 FR IM (CATHETERS) ×4 IMPLANT
CATH INFINITI 5FR ANG PIGTAIL (CATHETERS) ×4 IMPLANT
CATH INFINITI 5FR JL4 (CATHETERS) ×4 IMPLANT
CATH INFINITI JR4 5F (CATHETERS) ×4 IMPLANT
CATH SWANZ 7F THERMO (CATHETERS) ×4 IMPLANT
CATH VISTA GUIDE 6FR JR4 (CATHETERS) ×4 IMPLANT
DEVICE CLOSURE MYNXGRIP 6/7F (Vascular Products) ×8 IMPLANT
DEVICE INFLAT 30 PLUS (MISCELLANEOUS) ×4 IMPLANT
DEVICE SAFEGUARD 24CM (GAUZE/BANDAGES/DRESSINGS) ×4 IMPLANT
KIT MANI 3VAL PERCEP (MISCELLANEOUS) ×4 IMPLANT
KIT RIGHT HEART (MISCELLANEOUS) ×4 IMPLANT
NEEDLE PERC 18GX7CM (NEEDLE) ×4 IMPLANT
PACK CARDIAC CATH (CUSTOM PROCEDURE TRAY) ×4 IMPLANT
SHEATH AVANTI 6FR X 11CM (SHEATH) ×4 IMPLANT
SHEATH PINNACLE 5F 10CM (SHEATH) ×4 IMPLANT
SHEATH PINNACLE 7F 10CM (SHEATH) ×4 IMPLANT
STENT XIENCE ALPINE RX 3.0X23 (Permanent Stent) ×4 IMPLANT
WIRE EMERALD 3MM-J .035X150CM (WIRE) ×4 IMPLANT
WIRE EMERALD 3MM-J .035X260CM (WIRE) ×4 IMPLANT
WIRE G HI TQ BMW 190 (WIRE) ×4 IMPLANT

## 2015-07-17 NOTE — OR Nursing (Signed)
PAD off x 10 min, voding via urinal 500 cc urine,

## 2015-07-17 NOTE — Progress Notes (Signed)
Pt returned from cath lab, s/p stent. Rt groin minx closed. No bruising. Pos pedal pulses. Pt is painfree. Family at bedside.

## 2015-07-17 NOTE — Progress Notes (Signed)
SUBJECTIVE: Patient still has shortness of breath and occasional chest pain.   Filed Vitals:   07/16/15 2200 07/17/15 0600 07/17/15 0601 07/17/15 0800  BP:  153/69  144/63  Pulse: 58 72  64  Temp:  98.2 F (36.8 C)  97.9 F (36.6 C)  TempSrc:  Oral  Oral  Resp:  22  22  Height:      Weight:   110.814 kg (244 lb 4.8 oz)   SpO2:  92%  91%    Intake/Output Summary (Last 24 hours) at 07/17/15 0917 Last data filed at 07/17/15 0804  Gross per 24 hour  Intake    480 ml  Output   1700 ml  Net  -1220 ml    LABS: Basic Metabolic Panel:  Recent Labs  07/15/15 1203 07/15/15 1721 07/16/15 0441  NA 140  --  138  K 4.8  --  4.6  CL 104  --  104  CO2 26  --  28  GLUCOSE 106*  --  315*  BUN 18  --  27*  CREATININE 0.97  --  1.08  CALCIUM 9.5  --  9.3  MG  --  2.1  --    Liver Function Tests:  Recent Labs  07/15/15 1203  AST 23  ALT 13*  ALKPHOS 65  BILITOT 0.9  PROT 6.6  ALBUMIN 3.3*   No results for input(s): LIPASE, AMYLASE in the last 72 hours. CBC:  Recent Labs  07/15/15 1203 07/16/15 0441  WBC 9.6 6.0  NEUTROABS 7.5*  --   HGB 10.0* 9.5*  HCT 31.6* 30.2*  MCV 86.2 85.9  PLT 271 246   Cardiac Enzymes:  Recent Labs  07/15/15 1203 07/15/15 1721 07/16/15 0441  TROPONINI 0.26* 0.21* 0.09*   BNP: Invalid input(s): POCBNP D-Dimer: No results for input(s): DDIMER in the last 72 hours. Hemoglobin A1C:  Recent Labs  07/15/15 1721  HGBA1C 5.9   Fasting Lipid Panel: No results for input(s): CHOL, HDL, LDLCALC, TRIG, CHOLHDL, LDLDIRECT in the last 72 hours. Thyroid Function Tests: No results for input(s): TSH, T4TOTAL, T3FREE, THYROIDAB in the last 72 hours.  Invalid input(s): FREET3 Anemia Panel: No results for input(s): VITAMINB12, FOLATE, FERRITIN, TIBC, IRON, RETICCTPCT in the last 72 hours.   PHYSICAL EXAM General: Well developed, well nourished, in no acute distress HEENT:  Normocephalic and atramatic Neck:  No JVD.  Lungs: Clear  bilaterally to auscultation and percussion. Heart: HRRR . Normal S1 and S2 without gallops or murmurs.  Abdomen: Bowel sounds are positive, abdomen soft and non-tender  Msk:  Back normal, normal gait. Normal strength and tone for age. Extremities: No clubbing, cyanosis or edema.   Neuro: Alert and oriented X 3. Psych:  Good affect, responds appropriately  TELEMETRY: Sinus rhythm  ASSESSMENT AND PLAN: Non-STEMI/unstable angina. Patient has a history of CABG 3 vessels CHF hypertension diabetes presented with chest pain and wants to be followed up at [facility] for cardiology but does not have appointment till September. I had a long discussion with him that he should not be discharged and have cardiac catheterization today. Patient is going to talk to his family and decide as to what he wants to do. He seems like he had acute coronary syndrome with elevated troponin and EKG changes suggestive of ischemia. Echocardiogram shows borderline left radical systolic function with wall motion abnormality suggestive of coronary artery disease. Patient definitely needs cardiac catheterization B before being discharged and would like to do it today  if possible.  Principal Problem:   Acute respiratory failure with hypoxia Active Problems:   COPD exacerbation   Acute on chronic diastolic CHF (congestive heart failure)   Elevated troponin    [name] A, MD, [facility] 07/17/2015 9:17 AM

## 2015-07-17 NOTE — Progress Notes (Signed)
Patient was made an initial appointment at the outpatient [facility] on July 29, 2015 at 1:00pm. Thank you for the referral.

## 2015-07-17 NOTE — Progress Notes (Signed)
[place] at [facility]                                                                                                                                                                                            Patient Demographics   Justin Leonard, is a 81 y.o. male, DOB - [birth year], GMW:[id number]  Admit date - 07/15/2015   Admitting Physician Demetrios Loll, MD  Outpatient Primary MD for the patient is Olmedo, Guy Begin, MD   LOS - 2  Subjective: Shortness of breath is better, now is on oxygen 3 L and it cannular. But the patient has chest pain this morning, resolved with the nitroglycerin treatment.    Review of Systems:   CONSTITUTIONAL: No documented fever. No fatigue, weakness. No weight gain, no weight loss.  EYES: No blurry or double vision.  ENT: No tinnitus. No postnasal drip. No redness of the oropharynx.  RESPIRATORY: No cough, no wheeze, no hemoptysis. Positive dyspnea.  CARDIOVASCULAR: Positive chest pain. No orthopnea. No palpitations. No syncope. Leg edema is better. GASTROINTESTINAL: No nausea, no vomiting or diarrhea. No abdominal pain. No melena or hematochezia.  GENITOURINARY: No dysuria or hematuria.  ENDOCRINE: No polyuria or nocturia. No heat or cold intolerance.  HEMATOLOGY: No anemia. No bruising. No bleeding.  INTEGUMENTARY: No rashes. No lesions.  MUSCULOSKELETAL: No arthritis. No swelling. No gout.  NEUROLOGIC: No numbness, tingling, or ataxia. No seizure-type activity.  PSYCHIATRIC: No anxiety. No insomnia. No ADD.    Vitals:   Filed Vitals:   07/17/15 1000 07/17/15 1346 07/17/15 1438 07/17/15 1638  BP: 169/65 141/61  148/65  Pulse: 77   62  Temp:  97.8 F (36.6 C)    TempSrc:  Oral    Resp: 22   19  Height:      Weight:      SpO2: 98% 94% 92% 96%    Wt Readings from Last 3 Encounters:  07/17/15 110.814 kg (244 lb 4.8 oz)  02/05/15 113.399 kg (250 lb)     Intake/Output Summary (Last 24 hours) at  07/17/15 1648 Last data filed at 07/17/15 1407  Gross per 24 hour  Intake    360 ml  Output   2225 ml  Net  -1865 ml    Physical Exam:   GENERAL: Pleasant-appearing in no apparent distress.  HEAD, EYES, EARS, NOSE AND THROAT: Atraumatic, normocephalic. Extraocular muscles are intact. Pupils equal and reactive to light. Sclerae anicteric. No conjunctival injection. No oro-pharyngeal erythema.  NECK: Supple. There is no jugular venous distention. No bruits, no lymphadenopathy, no thyromegaly.  HEART: Regular rate and rhythm,.  No murmurs, no rubs, no clicks.  LUNGS: Bilateral air entry, No wheezing but has crackles at the bases. No use of accessory muscles to breathe. ABDOMEN: Soft, flat, nontender, nondistended. Has good bowel sounds. No hepatosplenomegaly appreciated.  EXTREMITIES: No evidence of any cyanosis, clubbing, or peripheral edema.  +2 pedal and radial pulses bilaterally.  NEUROLOGIC: The patient is alert, awake, and oriented x3 with no focal motor or sensory deficits appreciated bilaterally.  SKIN: Moist and warm with no rashes appreciated.  Psych: Not anxious, depressed LN: No inguinal LN enlargement    Antibiotics   Anti-infectives    None      Medications   Scheduled Meds: . amLODipine  10 mg Oral Daily  . [START ON 07/18/2015] aspirin  81 mg Oral Pre-Cath  . aspirin  81 mg Oral Daily  . budesonide (PULMICORT) nebulizer solution  0.25 mg Nebulization BID  . FLUoxetine  20 mg Oral Daily  . furosemide  40 mg Intravenous Q12H  . heparin  5,000 Units Subcutaneous 3 times per day  . insulin aspart  0-20 Units Subcutaneous TID WC  . insulin glargine  15 Units Subcutaneous QHS  . isosorbide mononitrate  30 mg Oral Daily  . lisinopril  40 mg Oral Daily  . methylPREDNISolone (SOLU-MEDROL) injection  60 mg Intravenous Q6H  . metoprolol  50 mg Oral BID  . pravastatin  20 mg Oral q1800  . sodium chloride  3 mL Intravenous Q12H  . sodium chloride  3 mL Intravenous Q12H   . sodium chloride  3 mL Intravenous Q12H  . sodium chloride  3 mL Intravenous Q12H  . ticagrelor  90 mg Oral BID  . tiotropium  18 mcg Inhalation Daily   Continuous Infusions: . [START ON 07/18/2015] sodium chloride     Followed by  . [START ON 07/18/2015] sodium chloride    . sodium chloride    . sodium chloride     PRN Meds:.sodium chloride, sodium chloride, sodium chloride, sodium chloride, [DISCONTINUED] acetaminophen **OR** acetaminophen, acetaminophen, albuterol, HYDROcodone-acetaminophen, nitroGLYCERIN, ondansetron (ZOFRAN) IV, ondansetron **OR** [DISCONTINUED] ondansetron (ZOFRAN) IV, sodium chloride, sodium chloride, sodium chloride, sodium chloride   Data Review:   Micro Results Recent Results (from the past 240 hour(s))  Blood culture (routine x 2)     Status: None (Preliminary result)   Collection Time: 07/15/15 12:03 PM  Result Value Ref Range Status   Specimen Description BLOOD LEFT HAND  Final   Special Requests   Final    BOTTLES DRAWN AEROBIC AND ANAEROBIC 2CC AEROBIC,2CC ANAEROBIC   Culture NO GROWTH 2 DAYS  Final   Report Status PENDING  Incomplete  Blood culture (routine x 2)     Status: None (Preliminary result)   Collection Time: 07/15/15 12:10 PM  Result Value Ref Range Status   Specimen Description BLOOD RIGHT ASSIST CONTROL  Final   Special Requests   Final    BOTTLES DRAWN AEROBIC AND ANAEROBIC 3CC AEROBIC,3CC ANAEROBIC   Culture NO GROWTH 2 DAYS  Final   Report Status PENDING  Incomplete    Radiology Reports Dg Chest Port 1 View  07/15/2015   CLINICAL DATA:  Shortness of breath.  Decreased oxygen saturation.  EXAM: PORTABLE CHEST - 1 VIEW  COMPARISON:  01/15/2015  FINDINGS: Patient rotated minimally right. Midline trachea. Mild cardiomegaly. Atherosclerosis in the transverse aorta. No pleural effusion or pneumothorax. Mild pulmonary venous congestion. Subsegmental atelectasis at the lung bases, especially lateral left lung base.  IMPRESSION:  Cardiomegaly with mild pulmonary  venous congestion.   Electronically Signed   By: Abigail Miyamoto M.D.   On: 07/15/2015 12:41     CBC  Recent Labs Lab 07/15/15 1203 07/16/15 0441  WBC 9.6 6.0  HGB 10.0* 9.5*  HCT 31.6* 30.2*  PLT 271 246  MCV 86.2 85.9  MCH 27.1 26.9  MCHC 31.5* 31.3*  RDW 15.6* 15.3*  LYMPHSABS 1.1  --   MONOABS 0.8  --   EOSABS 0.2  --   BASOSABS 0.1  --     Chemistries   Recent Labs Lab 07/15/15 1203 07/15/15 1721 07/16/15 0441  NA 140  --  138  K 4.8  --  4.6  CL 104  --  104  CO2 26  --  28  GLUCOSE 106*  --  315*  BUN 18  --  27*  CREATININE 0.97  --  1.08  CALCIUM 9.5  --  9.3  MG  --  2.1  --   AST 23  --   --   ALT 13*  --   --   ALKPHOS 65  --   --   BILITOT 0.9  --   --    ------------------------------------------------------------------------------------------------------------------ estimated creatinine clearance is 66 mL/min (by C-G formula based on Cr of 1.08). ------------------------------------------------------------------------------------------------------------------  Recent Labs  07/15/15 1721  HGBA1C 5.9   ------------------------------------------------------------------------------------------------------------------ No results for input(s): CHOL, HDL, LDLCALC, TRIG, CHOLHDL, LDLDIRECT in the last 72 hours. ------------------------------------------------------------------------------------------------------------------ No results for input(s): TSH, T4TOTAL, T3FREE, THYROIDAB in the last 72 hours.  Invalid input(s): FREET3 ------------------------------------------------------------------------------------------------------------------ No results for input(s): VITAMINB12, FOLATE, FERRITIN, TIBC, IRON, RETICCTPCT in the last 72 hours.  Coagulation profile No results for input(s): INR, PROTIME in the last 168 hours.  No results for input(s): DDIMER in the last 72 hours.  Cardiac Enzymes  Recent Labs Lab  07/15/15 1203 07/15/15 1721 07/16/15 0441  TROPONINI 0.26* 0.21* 0.09*   ------------------------------------------------------------------------------------------------------------------ Invalid input(s): POCBNP    Assessment & Plan    1.  Acute respiratory failure with hypoxia: Due to combination of acute on chronic diastolic CHF as well as acute on chronic COPD exasperation , continue treatment with IV Lasix , as well as nebulizers , taper steroids.  2.  COPD exacerbation. Taper Solu-Medrol, continue nebulizers and Spiriva and budesonide.   3.  Acute on chronic diastolic CHF (congestive heart failure). Continue IV Lasix   4.  Elevated troponin Due to demand ischemia.  The patient has a chest pain today. Dr. Humphrey Rolls suggests cardiac Catheter which show multivessel stenosis. Dr. Humphrey Rolls is going to do PCI and start Brilinta.   5.  DM II, blood sugar was elevated due to his steroids, I started Lantus 50 units daily with sliding scale.    6. CAD. Continue aspirin, brilinta, Pravachol, Lopressor and isosorbide mononitrate   7.  Hyperlipdemia continue Pravachol   I discussed with the patient, his wife and son, case Freight forwarder and Education officer, museum.      Code Status Orders        Start     Ordered   07/15/15 1704  Full code   Continuous     07/15/15 1704           Consults none   DVT Prophylaxisheparin  Lab Results  Component Value Date   PLT 246 07/16/2015     Time Spent in minutes  46  minutes     Demetrios Loll M.D on 07/17/2015 at 4:48 PM  Between 7am to 6pm - Pager -  [phone]  After 6pm go to [email or web address] - password [email or web address] [facility]   Office  [phone]

## 2015-07-17 NOTE — Care Management (Signed)
Most likely patient is going to need home 02.  There are room air sats within last 24 hours of 79 with exertion and 89 at rest but there are no 02  recovery sats documented.  Discussed this during prgression

## 2015-07-17 NOTE — Progress Notes (Signed)
   07/17/15 1200  Clinical Encounter Type  Visited With Patient and family together  Visit Type Initial  Referral From Nurse  Consult/Referral To Chaplain  Spiritual Encounters  Spiritual Needs Prayer;Emotional  Stress Factors  Patient Stress Factors Exhausted;Family relationships;Health changes;Major life changes  Family Stress Factors Exhausted;Family relationships;Health changes  Met w/patient & family and provided spiritual & emotional care & prayer.   Chap. Elky Funches G. K[place]ke

## 2015-07-17 NOTE — Progress Notes (Addendum)
Date: 07/17/2015,   MRN# [id number] Justin Leonard 1927/06/05 Code Status:     Code Status Orders        Start     Ordered   07/17/15 1613  Full code   Continuous     07/17/15 [place] day:@LENGTHOFSTAYDAYS @ Referring MD: @ATDPROV @     PCP:      AdmissionWeight: 240 lb (108.863 kg)                 CurrentWeight: 244 lb 4.8 oz (110.814 kg) CC: sob  HPI: This is an 81 yr old male here with sob, cough, chest pain off and on, wheezing, orthopnea and leg edema. Sats on arrival was in the 70's.  He is known to have copd, diastolic dysfunction, cad, hypertension and diabetes. Present back from the cath lab. Less sob. No audible wheezing or calf pain. Meds noted.    PMHX:   Past Medical History  Diagnosis Date  . CHF (congestive heart failure)   . Hypertension   . Diabetes mellitus without complication   . Coronary artery disease   . Hyperlipidemia   . Deafness   . Peripheral neuropathy   . Back pain   . Umbilical hernia   . Depression   . Melena   . COPD (chronic obstructive pulmonary disease)   . Skin cancer    Surgical Hx:  Past Surgical History  Procedure Laterality Date  . Coronary artery bypass graft     Family Hx:  History reviewed. No pertinent family history. Social Hx:   Social History  Substance Use Topics  . Smoking status: Former Smoker -- 3.00 packs/day for 30 years    Types: Cigarettes    Quit date: 04/16/1978  . Smokeless tobacco: Never Used     Comment: Quit smoking 1979  . Alcohol Use: No   Medication:    Home Medication:  No current outpatient prescriptions on file.  Current Medication: @CURMEDTAB @   Allergies:  Review of patient's allergies indicates no known allergies.  Review of Systems: Gen:  Denies  fever, sweats, chills HEENT: Denies blurred vision, double vision, ear pain, eye pain, hearing loss, nose bleeds, sore throat Cvc:  No dizziness, chest pain or heaviness Resp: SOB, min cough, no pleurisy   Gi: Denies swallowing  difficulty, stomach pain, nausea or vomiting, diarrhea, constipation, bowel incontinence Gu:  Denies bladder incontinence, burning urine Ext:   No Joint pain, stiffness or swelling Skin: No skin rash, easy bruising or bleeding or hives Endoc:  No polyuria, polydipsia , polyphagia or weight change Psych: No depression, insomnia or hallucinations  Other:  All other systems negative  Physical Examination:   VS: BP 145/63 mmHg  Pulse 57  Temp(Src) 97.8 F (36.6 C) (Oral)  Resp 15  Ht 6\' 3"  (1.905 m)  Wt 244 lb 4.8 oz (110.814 kg)  BMI 30.54 kg/m2  SpO2 97%  General Appearance: No distress  Neuro: without focal findings, mental status, speech normal, alert and oriented, cranial nerves 2-12 intact, reflexes normal and symmetric, sensation grossly normal  HEENT: PERRLA, EOM intact, no ptosis, no other lesions noticed, Mallampati: Pulmonary:.No wheezing, No rales  Sputum Production:   Cardiovascular:  Normal S1,S2.  No m/r/g.  Abdominal aorta pulsation normal.    Abdomen:Benign, Soft, non-tender, No masses, hepatosplenomegaly, No lymphadenopathy Endoc: No evident thyromegaly, no signs of acromegaly or Cushing features Skin:   warm, no rashes, no ecchymosis  Extremities: normal, no cyanosis, clubbing, no edema, warm with  normal capillary refill. Other findings:   Labs results:   Recent Labs     07/15/15  1203  07/16/15  0441  HGB  10.0*  9.5*  HCT  31.6*  30.2*  MCV  86.2  85.9  WBC  9.6  6.0  BUN  18  27*  CREATININE  0.97  1.08  GLUCOSE  106*  315*  CALCIUM  9.5  9.3  ,    Rad results:   FINDINGS: cxr Patient rotated minimally right. Midline trachea. Mild cardiomegaly. Atherosclerosis in the transverse aorta. No pleural effusion or pneumothorax. Mild pulmonary venous congestion. Subsegmental atelectasis at the lung bases, especially lateral left lung base.  IMPRESSION: Cardiomegaly with mild pulmonary venous congestion.  Study Conclusions, echo  - Left  ventricle: The cavity size was mildly dilated. Systolic function was normal. The estimated ejection fraction was in the range of 50% to 55%. Doppler parameters are consistent with abnormal left ventricular relaxation (grade 1 diastolic dysfunction). - Aortic valve: There was trivial regurgitation. - Mitral valve: There was mild regurgitation. - Left atrium: The atrium was mildly dilated. - Right ventricle: The cavity size was mildly dilated. - Atrial septum: No defect or patent foramen ovale was identified.  Impressions:  - patient has a normal left reticular systolic function with mild diastolic dysfunction and biatrial enlargement. There is wall motion abnormality suggestive of coronary artery disease.    Conclusion     Prox RCA lesion, 100% stenosed.  Mid Cx lesion, 100% stenosed.  Prox Cx lesion, 90% stenosed.  Ost LAD to Prox LAD lesion, 90% stenosed.  Mid LAD lesion, 100% stenosed.  LIMA .  SVG to OM1 is patent  SVG to distal RCA is patent but has disease distal to anastomosis with PDA having 90% lesion for which PCI/Stenting to be done.  Has occluded mid LAD, LCX and proximal RCA. LIMA to LAD and SVG to OM patent but has SVG tp dital RCA is patent. But distal to anastomosis of RCA graft has disease in PDA 90%. Advise PCI/stenting with DES     Assessment and Plan: This is an 81 yr old male here with hypoxia, cough and wheezing. Low sats.    -CHF with multiple vessel cad, diastolic dysfunction Stent placement Diuresis Following cardilogy recs   -Known hx of copd Determine need for home o2 Out patient copd follow up eval/pft Spiriva, albuterol, laba, ics Annual flu shot recommended     I have personally obtained a history, examined the patient, evaluated laboratory and imaging results, formulated the assessment and plan and placed orders.  The Patient requires high complexity decision making for assessment and support, frequent evaluation  and titration of therapies, application of advanced monitoring technologies and extensive interpretation of multiple databases.   Kashmir Leedy,M.D. Pulmonary & Critical care Medicine [facility]

## 2015-07-17 NOTE — Progress Notes (Signed)
   07/17/15 2000  Clinical Encounter Type  Visited With Patient and family together  Visit Type Follow-up  Consult/Referral To Chaplain  Spiritual Encounters  Spiritual Needs Prayer;Emotional  Met w/patient & family. Family shared positive report from today's procedure. Provided spiritual care and prayer for family.  Chap. Arra Connaughton G. [place]

## 2015-07-17 NOTE — Progress Notes (Signed)
Patient had left heart catheterization without complications and had occluded LAD, left circumflex and right coronary artery. Even though native coronaries were occluded all 3 grafts were open. LIMA to the LAD, SVG to OM and SVG to distal right coronary. Even though grafts are open there is a disease distal to the right coronary artery vein graft approximately 90%. Plan is to do PCI and stenting of the distal right coronary artery branch call PDA. Discussed the results with the family and they agreed to proceed with PCI and stenting.

## 2015-07-17 NOTE — Care Management Important Message (Signed)
Important Message  Patient Details  Name: Justin Leonard MRN: [id number] Date of Birth: 01/23/1927   Medicare Important Message Given:  Yes-second notification given    [name] 07/17/2015, 11:14 AM

## 2015-07-18 ENCOUNTER — Encounter: Payer: Self-pay | Admitting: Cardiovascular Disease

## 2015-07-18 LAB — BASIC METABOLIC PANEL
ANION GAP: 7 (ref 5–15)
BUN: 38 mg/dL — ABNORMAL HIGH (ref 6–20)
CALCIUM: 9.6 mg/dL (ref 8.9–10.3)
CHLORIDE: 102 mmol/L (ref 101–111)
CO2: 29 mmol/L (ref 22–32)
CREATININE: 1.02 mg/dL (ref 0.61–1.24)
GFR calc non Af Amer: >60 mL/min (ref >60)
Glucose, Bld: 339 mg/dL — ABNORMAL HIGH (ref 65–99)
Potassium: 4.8 mmol/L (ref 3.5–5.1)
SODIUM: 138 mmol/L (ref 135–145)

## 2015-07-18 LAB — GLUCOSE, CAPILLARY: GLUCOSE-CAPILLARY: 281 mg/dL — AB (ref 65–99)

## 2015-07-18 MED ORDER — FUROSEMIDE 40 MG PO TABS
40.0000 mg | ORAL_TABLET | Freq: Two times a day (BID) | ORAL | Status: DC
Start: 2015-07-18 — End: 2015-11-14

## 2015-07-18 MED ORDER — POTASSIUM CHLORIDE ER 10 MEQ PO TBCR: 10.0000 meq | EXTENDED_RELEASE_TABLET | Freq: Every day | ORAL | Status: DC

## 2015-07-18 MED ORDER — TICAGRELOR 90 MG PO TABS: 90.0000 mg | ORAL_TABLET | Freq: Two times a day (BID) | ORAL | Status: DC

## 2015-07-18 NOTE — Care Management (Signed)
Spoke with cardiology.  Patient had the PCI 8/18 and from cardiology standpoint, patient could discharge home today. Discussed the need to qualify for home 02.  Patient will benefit from home health nursing and he is in agreement.  No agency preference.

## 2015-07-18 NOTE — Progress Notes (Signed)
SUBJECTIVE: Feeling much better. Ready to go home.   Filed Vitals:   07/17/15 1848 07/17/15 2006 07/17/15 2050 07/18/15 0553  BP: 151/62  143/66 158/72  Pulse: 61  61 64  Temp: 98.3 F (36.8 C)  97.6 F (36.4 C) 98.3 F (36.8 C)  TempSrc: Oral  Oral Oral  Resp: 20  20 20   Height:      Weight:    109.589 kg (241 lb 9.6 oz)  SpO2: 93% 96% 94% 90%    Intake/Output Summary (Last 24 hours) at 07/18/15 0901 Last data filed at 07/18/15 0555  Gross per 24 hour  Intake      0 ml  Output   1975 ml  Net  -1975 ml    LABS: Basic Metabolic Panel:  Recent Labs  07/15/15 1721 07/16/15 0441 07/18/15 0417  NA  --  138 138  K  --  4.6 4.8  CL  --  104 102  CO2  --  28 29  GLUCOSE  --  315* 339*  BUN  --  27* 38*  CREATININE  --  1.08 1.02  CALCIUM  --  9.3 9.6  MG 2.1  --   --    Liver Function Tests:  Recent Labs  07/15/15 1203  AST 23  ALT 13*  ALKPHOS 65  BILITOT 0.9  PROT 6.6  ALBUMIN 3.3*   No results for input(s): LIPASE, AMYLASE in the last 72 hours. CBC:  Recent Labs  07/15/15 1203 07/16/15 0441  WBC 9.6 6.0  NEUTROABS 7.5*  --   HGB 10.0* 9.5*  HCT 31.6* 30.2*  MCV 86.2 85.9  PLT 271 246   Cardiac Enzymes:  Recent Labs  07/15/15 1203 07/15/15 1721 07/16/15 0441  TROPONINI 0.26* 0.21* 0.09*   BNP: Invalid input(s): POCBNP D-Dimer: No results for input(s): DDIMER in the last 72 hours. Hemoglobin A1C:  Recent Labs  07/15/15 1721  HGBA1C 5.9   Fasting Lipid Panel: No results for input(s): CHOL, HDL, LDLCALC, TRIG, CHOLHDL, LDLDIRECT in the last 72 hours. Thyroid Function Tests: No results for input(s): TSH, T4TOTAL, T3FREE, THYROIDAB in the last 72 hours.  Invalid input(s): FREET3 Anemia Panel: No results for input(s): VITAMINB12, FOLATE, FERRITIN, TIBC, IRON, RETICCTPCT in the last 72 hours.   PHYSICAL EXAM General: Well developed, well nourished, in no acute distress HEENT:  Normocephalic and atramatic Neck:  No JVD.  Lungs:  Clear bilaterally to auscultation and percussion. Heart: HRRR . Normal S1 and S2 without gallops or murmurs.  Abdomen: Bowel sounds are positive, abdomen soft and non-tender  Msk:  Back normal, normal gait. Normal strength and tone for age. Extremities: No clubbing, cyanosis or edema. No inguinal bruit or hematoma Neuro: Alert and oriented X 3. Psych:  Good affect, responds appropriately  TELEMETRY: NSR  ASSESSMENT AND PLAN: Pt with distal RCA lesion, PDA, 95%. Successful PCI completed. Pt given f/u in office Tuesday at 11am.   Principal Problem:   Acute respiratory failure with hypoxia Active Problems:   COPD exacerbation   Acute on chronic diastolic CHF (congestive heart failure)   Elevated troponin    [name] A, MD, [facility] 07/18/2015 9:01 AM

## 2015-07-18 NOTE — Care Management (Signed)
Patient's son's present and is also in agreement with home health.  Agency preference is [facility] for the 02 and nursing. Referrals have been sent.  Requested patient to have home health nursing visit on 8/20.  Portable tank to be delivered to the room

## 2015-07-18 NOTE — Progress Notes (Signed)
Inpatient Diabetes Program Recommendations  AACE/ADA: New Consensus Statement on Inpatient Glycemic Control (2013)  Target Ranges:  Prepandial:   less than 140 mg/dL      Peak postprandial:   less than 180 mg/dL (1-2 hours)      Critically ill patients:  140 - 180 mg/dL   Results for Justin Leonard, Justin Leonard (MRN [id number]) as of 07/18/2015 10:43  Ref. Range 07/16/2015 07:15 07/16/2015 11:07 07/16/2015 16:01 07/16/2015 20:20 07/17/2015 20:53  Glucose-Capillary Latest Ref Range: 65-99 mg/dL 292 (H) 307 (H) 313 (H) 313 (H) 343 (H)   Fasting blood sugar this am 328mg /dl  Diabetes history: Type 2 diabetes Outpatient Diabetes medications: Diabeta 5 mg bid, Metformin 500 mg tid with meals Current orders for Inpatient glycemic control: Novolog resistant 0-20 units tid with meals and HS, Lantus 15 units qhs  Consider increasing Lantus to 22 units qhs (0.2units/kg)  Note that CBG's elevated with steroid. As steroids are decreased, insulin will also need to be decreased. Based on A1C 5.9%, patient does not need insulin at home.  Gentry Fitz, RN, BA, MHA, CDE Diabetes Coordinator Inpatient Diabetes Program  [phone] (Team Pager) [phone] ([place]) 07/18/2015 10:49 AM

## 2015-07-18 NOTE — Progress Notes (Signed)
CBG 328 per Luvenia Heller, NT - glucometer not syncing with [facility] at this time

## 2015-07-18 NOTE — Discharge Summary (Signed)
[place] [facility] [place] NAME: Justin Leonard    MR#:  [id number]  DATE OF BIRTH:  04/06/1927  DATE OF ADMISSION:  07/15/2015 ADMITTING PHYSICIAN: Demetrios Loll, MD  DATE OF DISCHARGE: 07/18/2015 PRIMARY CARE PHYSICIAN: [name], MD    ADMISSION DIAGNOSIS:  COPD exacerbation [J44.1] Troponin level elevated [R79.89]   DISCHARGE DIAGNOSIS:  Acute respiratory failure with hypoxia  Acute on chronic diastolic CHF  COPD exacerbation Elevated troponin Due to demand ischemia CAD, s/p PCI yesterday. SECONDARY DIAGNOSIS:   Past Medical History  Diagnosis Date  . CHF (congestive heart failure)   . Hypertension   . Diabetes mellitus without complication   . Coronary artery disease   . Hyperlipidemia   . Deafness   . Peripheral neuropathy   . Back pain   . Umbilical hernia   . Depression   . Melena   . COPD (chronic obstructive pulmonary disease)   . Skin cancer     HOSPITAL COURSE:   1. Acute respiratory failure with hypoxia: Due to combination of acute on chronic diastolic CHF as well as acute on chronic COPD exasperation , the patient has been treated with IV Lasix , nebulizers and solumedrol which was tapered and discontinued. The patient also saturation decreased at 80s on exertion without oxygen. Patient need home oxygen 2-3 L a minute it cannular.  2. COPD exacerbation. He was treated with Solu-Medrol, nebulizers and Spiriva and budesonide. His symptoms has much improved.  3. Acute on chronic diastolic CHF (congestive heart failure). He has been treated with IV Lasix. He was on Lasix 40 mg by mouth daily before this admission. I will increase to 40 mg twice a day.  4. Elevated troponin Due to demand ischemia.  The patient has a chest pain yesterday. Dr. Humphrey Rolls did cardiac Catheter which show multivessel stenosis. Dr. Humphrey Rolls did PCI and started Brilinta. The patient will follow up with Dr. Humphrey Rolls this coming  Tuesday.  5. DM II, blood sugar was elevated due to his steroids, I started Lantus 15 units daily with sliding scale. The patient will continue home diabetes medications after discharge.   6. CAD. He has been treated with aspirin, Pravachol, Lopressor and isosorbide mononitrate. Dr. Humphrey Rolls started, brilinta.   7. Hyperlipdemia. continue Pravachol   DISCHARGE CONDITIONS:    The patient is clinically stable and will be discharged to home with home oxygen 2-3 L by nasal cannula and home health.  CONSULTS OBTAINED:  Treatment Team:  [name], MD Demetrios Loll, MD Erby Pian, MD  DRUG ALLERGIES:  No Known Allergies  DISCHARGE MEDICATIONS:   Current Discharge Medication List    START taking these medications   Details  ticagrelor (BRILINTA) 90 MG TABS tablet Take 1 tablet (90 mg total) by mouth 2 (two) times daily. Qty: 60 tablet, Refills: 0      CONTINUE these medications which have CHANGED   Details  furosemide (LASIX) 40 MG tablet Take 1 tablet (40 mg total) by mouth 2 (two) times daily. Qty: 60 tablet, Refills: 0      CONTINUE these medications which have NOT CHANGED   Details  acetaminophen (TYLENOL) 325 MG tablet Take 650 mg by mouth every 6 (six) hours as needed.    amLODipine (NORVASC) 10 MG tablet Take 10 mg by mouth daily.    aspirin EC 81 MG tablet Take 81 mg by mouth daily.    FLUoxetine (PROZAC) 20 MG capsule Take 20 mg  by mouth daily.    glyBURIDE (DIABETA) 5 MG tablet Take 5 mg by mouth 2 (two) times daily with a meal.    HYDROcodone-acetaminophen (NORCO/VICODIN) 5-325 MG per tablet Take 1 tablet by mouth as needed.    isosorbide mononitrate (IMDUR) 30 MG 24 hr tablet Take 30 mg by mouth daily.    lisinopril (PRINIVIL,ZESTRIL) 40 MG tablet Take 40 mg by mouth daily.    lovastatin (MEVACOR) 20 MG tablet Take 20 mg by mouth at bedtime.    metFORMIN (GLUCOPHAGE) 500 MG tablet Take by mouth 3 (three) times daily.    metoprolol (LOPRESSOR) 50  MG tablet Take 50 mg by mouth 2 (two) times daily.    nitroGLYCERIN (NITROSTAT) 0.4 MG SL tablet Place 1 tablet under the tongue every 5 (five) minutes as needed.    tiotropium (SPIRIVA) 18 MCG inhalation capsule Place 18 mcg into inhaler and inhale daily.    VENTOLIN HFA 108 (90 BASE) MCG/ACT inhaler Inhale 1-2 puffs into the lungs as needed.    ofloxacin (OCUFLOX) 0.3 % ophthalmic solution Place 1 drop into both eyes 4 (four) times daily.         DISCHARGE INSTRUCTIONS:    If you experience worsening of your admission symptoms, develop shortness of breath, life threatening emergency, suicidal or homicidal thoughts you must seek medical attention immediately by calling 911 or calling your MD immediately  if symptoms less severe.  You Must read complete instructions/literature along with all the possible adverse reactions/side effects for all the Medicines you take and that have been prescribed to you. Take any new Medicines after you have completely understood and accept all the possible adverse reactions/side effects.   Please note  You were cared for by a hospitalist during your hospital stay. If you have any questions about your discharge medications or the care you received while you were in the hospital after you are discharged, you can call the unit and asked to speak with the hospitalist on call if the hospitalist that took care of you is not available. Once you are discharged, your primary care physician will handle any further medical issues. Please note that NO REFILLS for any discharge medications will be authorized once you are discharged, as it is imperative that you return to your primary care physician (or establish a relationship with a primary care physician if you do not have one) for your aftercare needs so that they can reassess your need for medications and monitor your lab values.    Today   SUBJECTIVE   No shortness of breath on oxygen 3 L nasal  cannular.   VITAL SIGNS:  Blood pressure 146/63, pulse 58, temperature 97.7 F (36.5 C), temperature source Oral, resp. rate 20, height 6\' 3"  (1.905 m), weight 109.589 kg (241 lb 9.6 oz), SpO2 94 %.  I/O:   Intake/Output Summary (Last 24 hours) at 07/18/15 1319 Last data filed at 07/18/15 0949  Gross per 24 hour  Intake    240 ml  Output   2350 ml  Net  -2110 ml    PHYSICAL EXAMINATION:  GENERAL:  81 y.o.-year-old patient lying in the bed with no acute distress. Obese. EYES: Pupils equal, round, reactive to light and accommodation. No scleral icterus. Extraocular muscles intact.  HEENT: Head atraumatic, normocephalic. Oropharynx and nasopharynx clear.  NECK:  Supple, no jugular venous distention. No thyroid enlargement, no tenderness.  LUNGS: Normal breath sounds bilaterally, no wheezing, rales,rhonchi or crepitation. No use of accessory muscles of respiration.  CARDIOVASCULAR: S1, S2 normal. No murmurs, rubs, or gallops.  ABDOMEN: Soft, non-tender, non-distended. Bowel sounds present. No organomegaly or mass.  EXTREMITIES: No pedal edema, cyanosis, or clubbing.  NEUROLOGIC: Cranial nerves II through XII are intact. Muscle strength 5/5 in all extremities. Sensation intact. Gait not checked.  PSYCHIATRIC: The patient is alert and oriented x 3.  SKIN: No obvious rash, lesion, or ulcer.   DATA REVIEW:   CBC  Recent Labs Lab 07/16/15 0441  WBC 6.0  HGB 9.5*  HCT 30.2*  PLT 246    Chemistries   Recent Labs Lab 07/15/15 1203 07/15/15 1721  07/18/15 0417  NA 140  --   < > 138  K 4.8  --   < > 4.8  CL 104  --   < > 102  CO2 26  --   < > 29  GLUCOSE 106*  --   < > 339*  BUN 18  --   < > 38*  CREATININE 0.97  --   < > 1.02  CALCIUM 9.5  --   < > 9.6  MG  --  2.1  --   --   AST 23  --   --   --   ALT 13*  --   --   --   ALKPHOS 65  --   --   --   BILITOT 0.9  --   --   --   < > = values in this interval not displayed.  Cardiac Enzymes  Recent Labs Lab  07/16/15 0441  TROPONINI 0.09*    Microbiology Results  Results for orders placed or performed during the hospital encounter of 07/15/15  Blood culture (routine x 2)     Status: None (Preliminary result)   Collection Time: 07/15/15 12:03 PM  Result Value Ref Range Status   Specimen Description BLOOD LEFT HAND  Final   Special Requests   Final    BOTTLES DRAWN AEROBIC AND ANAEROBIC 2CC AEROBIC,2CC ANAEROBIC   Culture NO GROWTH 3 DAYS  Final   Report Status PENDING  Incomplete  Blood culture (routine x 2)     Status: None (Preliminary result)   Collection Time: 07/15/15 12:10 PM  Result Value Ref Range Status   Specimen Description BLOOD RIGHT ASSIST CONTROL  Final   Special Requests   Final    BOTTLES DRAWN AEROBIC AND ANAEROBIC 3CC AEROBIC,3CC ANAEROBIC   Culture NO GROWTH 3 DAYS  Final   Report Status PENDING  Incomplete    RADIOLOGY:  No results found.      Management plans discussed with the patient, his wife and they are in agreement.  CODE STATUS:     Code Status Orders        Start     Ordered   07/17/15 1613  Full code   Continuous     07/17/15 1614      TOTAL TIME TAKING CARE OF THIS PATIENT: 37 minutes.    Demetrios Loll M.D on 07/18/2015 at 1:19 PM  Between 7am to 6pm - Pager - [phone]  After 6pm go to [email or web address] - password [email or web address] [place] [facility]  Office  [phone]  CC: Primary care physician; [name], MD

## 2015-07-18 NOTE — Progress Notes (Signed)
SATURATION QUALIFICATIONS: (This note is used to comply with regulatory documentation for home oxygen)  Patient Saturations on Room Air at Rest = 90%  Patient Saturations on Room Air while Ambulating = 85%  Patient Saturations on 2 Liters of oxygen while Ambulating = 93%  Please briefly explain why patient needs home oxygen: desats while ambulating

## 2015-07-18 NOTE — Progress Notes (Signed)
Pt refuses bed alarm - educated on safety

## 2015-07-18 NOTE — Discharge Instructions (Signed)
Heart Failure Clinic appointment on July 29, 2015 at 1:00pm with Darylene Price, [place]. Please call [phone] to reschedule.   Low sodium, low fat and ADA diet. Activity as tolerated. Home O2 [place] 2-3L portable.

## 2015-07-18 NOTE — Care Management (Deleted)
Anticipate PCI.  Discussed need to obtain 02 sats to determine if patient qualifies for home 02.  The need to obtain these sats discussed in the event patient would discharge home over the weekend- setting up new home 02 would not delay discharge.

## 2015-07-18 NOTE — Progress Notes (Signed)
Patient given discharge teaching and paperwork regarding medications, diet, follow-up appointments and activity. Patient understanding verbalized. No complaints at this time. IV and telemetry discontinued prior to leaving. Skin assessment as previously charted and vitals are stable; on 2L - O2 portable tank delivered to patient in room. Patient being discharged to home. Caregiver/family present during discharge teaching. No further needs by Care Management/Social Work. Prescriptions: brilinta script given to patient, lasix sent to [facility].  Toniann Ket

## 2015-07-20 LAB — CULTURE, BLOOD (ROUTINE X 2)
CULTURE: NO GROWTH
CULTURE: NO GROWTH

## 2015-07-23 LAB — GLUCOSE, CAPILLARY
GLUCOSE-CAPILLARY: 365 mg/dL — AB (ref 65–99)
Glucose-Capillary: 311 mg/dL — ABNORMAL HIGH (ref 65–99)
Glucose-Capillary: 328 mg/dL — ABNORMAL HIGH (ref 65–99)

## 2015-07-29 ENCOUNTER — Ambulatory Visit: Payer: Medicare Other | Attending: Family | Admitting: Family

## 2015-07-29 ENCOUNTER — Encounter: Payer: Self-pay | Admitting: Family

## 2015-07-29 VITALS — BP 123/47 | HR 58 | Resp 20 | Ht 74.0 in | Wt 236.0 lb

## 2015-07-29 DIAGNOSIS — I2510 Atherosclerotic heart disease of native coronary artery without angina pectoris: Secondary | ICD-10-CM | POA: Insufficient documentation

## 2015-07-29 DIAGNOSIS — E119 Type 2 diabetes mellitus without complications: Secondary | ICD-10-CM | POA: Diagnosis not present

## 2015-07-29 DIAGNOSIS — Z85828 Personal history of other malignant neoplasm of skin: Secondary | ICD-10-CM | POA: Diagnosis not present

## 2015-07-29 DIAGNOSIS — Z87891 Personal history of nicotine dependence: Secondary | ICD-10-CM | POA: Diagnosis not present

## 2015-07-29 DIAGNOSIS — E785 Hyperlipidemia, unspecified: Secondary | ICD-10-CM | POA: Insufficient documentation

## 2015-07-29 DIAGNOSIS — F329 Major depressive disorder, single episode, unspecified: Secondary | ICD-10-CM | POA: Insufficient documentation

## 2015-07-29 DIAGNOSIS — M549 Dorsalgia, unspecified: Secondary | ICD-10-CM | POA: Insufficient documentation

## 2015-07-29 DIAGNOSIS — H9190 Unspecified hearing loss, unspecified ear: Secondary | ICD-10-CM | POA: Diagnosis not present

## 2015-07-29 DIAGNOSIS — Z79899 Other long term (current) drug therapy: Secondary | ICD-10-CM | POA: Insufficient documentation

## 2015-07-29 DIAGNOSIS — I5032 Chronic diastolic (congestive) heart failure: Secondary | ICD-10-CM | POA: Insufficient documentation

## 2015-07-29 DIAGNOSIS — G629 Polyneuropathy, unspecified: Secondary | ICD-10-CM | POA: Insufficient documentation

## 2015-07-29 DIAGNOSIS — I10 Essential (primary) hypertension: Secondary | ICD-10-CM | POA: Insufficient documentation

## 2015-07-29 DIAGNOSIS — R001 Bradycardia, unspecified: Secondary | ICD-10-CM | POA: Insufficient documentation

## 2015-07-29 DIAGNOSIS — Z7982 Long term (current) use of aspirin: Secondary | ICD-10-CM | POA: Diagnosis not present

## 2015-07-29 DIAGNOSIS — J449 Chronic obstructive pulmonary disease, unspecified: Secondary | ICD-10-CM | POA: Diagnosis not present

## 2015-07-29 NOTE — Patient Instructions (Signed)
Continue weighing daily and call for an overnight weight gain of > 2 pounds or a weekly weight gain of >5 pounds. 

## 2015-07-29 NOTE — Progress Notes (Signed)
Subjective:    Patient ID: Justin Leonard, male    DOB: 1927/11/28, 81 y.o.   MRN: [id number]  Congestive Heart Failure Presents for follow-up visit. The disease course has been stable. Associated symptoms include edema, fatigue and shortness of breath (wearing oxygen at 1.5 liters). Pertinent negatives include no abdominal pain, chest pain (none since stent placed), chest pressure, palpitations or paroxysmal nocturnal dyspnea. The symptoms have been stable. Past treatments include ACE inhibitors, beta blockers, oxygen and salt and fluid restriction. The treatment provided moderate relief. Compliance with prior treatments has been good. His past medical history is significant for CAD, chronic lung disease, DM and HTN. Compliance with total regimen is 76-100%.  Other This is a chronic (edema) problem. The current episode started more than 1 year ago. The problem occurs daily. The problem has been unchanged. Associated symptoms include coughing (dry cough), fatigue and headaches (dull). Pertinent negatives include no abdominal pain, chest pain (none since stent placed), congestion, neck pain or sore throat. The symptoms are aggravated by standing. He has tried lying down for the symptoms. The treatment provided moderate relief.   Past Medical History  Diagnosis Date  . CHF (congestive heart failure)   . Hypertension   . Diabetes mellitus without complication   . Coronary artery disease   . Hyperlipidemia   . Deafness   . Peripheral neuropathy   . Back pain   . Umbilical hernia   . Depression   . Melena   . COPD (chronic obstructive pulmonary disease)   . Skin cancer    Past Surgical History  Procedure Laterality Date  . Coronary artery bypass graft    . Cardiac catheterization Left 07/17/2015    Procedure: Right/Left Heart Cath and Coronary Angiography;  Surgeon: [name], MD;  Location: [facility];  Service: Cardiovascular;  Laterality: Left;  . Cardiac catheterization  N/A 07/17/2015    Procedure: Coronary Stent Intervention;  Surgeon: [name], MD;  Location: [facility];  Service: Cardiovascular;  Laterality: N/A;  . Coronary angioplasty      History reviewed. No pertinent family history.  Social History  Substance Use Topics  . Smoking status: Former Smoker -- 3.00 packs/day for 30 years    Types: Cigarettes    Quit date: 04/16/1978  . Smokeless tobacco: Never Used     Comment: Quit smoking 1979  . Alcohol Use: No    No Known Allergies  Prior to Admission medications   Medication Sig Start Date End Date Taking? Authorizing Provider  acetaminophen (TYLENOL) 325 MG tablet Take 650 mg by mouth every 6 (six) hours as needed.   Yes Historical Provider, MD  aspirin EC 81 MG tablet Take 81 mg by mouth daily.   Yes Historical Provider, MD  FLUoxetine (PROZAC) 20 MG capsule Take 20 mg by mouth daily.   Yes Historical Provider, MD  furosemide (LASIX) 40 MG tablet Take 1 tablet (40 mg total) by mouth 2 (two) times daily. 07/18/15  Yes Demetrios Loll, MD  glyBURIDE (DIABETA) 5 MG tablet Take 5 mg by mouth 2 (two) times daily with a meal.   Yes Historical Provider, MD  HYDROcodone-acetaminophen (NORCO/VICODIN) 5-325 MG per tablet Take 1 tablet by mouth as needed. 03/10/15 10/25/15 Yes Historical Provider, MD  isosorbide mononitrate (IMDUR) 30 MG 24 hr tablet Take 30 mg by mouth daily.   Yes Historical Provider, MD  lisinopril (PRINIVIL,ZESTRIL) 40 MG tablet Take 40 mg by mouth daily.   Yes  Historical Provider, MD  lovastatin (MEVACOR) 20 MG tablet Take 20 mg by mouth at bedtime.   Yes Historical Provider, MD  metFORMIN (GLUCOPHAGE) 500 MG tablet Take 500 mg by mouth 2 (two) times daily with a meal. 2 tablets in AM and 1 tablet in PM   Yes Historical Provider, MD  metoprolol (LOPRESSOR) 50 MG tablet Take 50 mg by mouth 2 (two) times daily.   Yes Historical Provider, MD  nitroGLYCERIN (NITROSTAT) 0.4 MG SL tablet Place 1 tablet under the tongue every  5 (five) minutes as needed. 07/15/15 07/14/16 Yes Historical Provider, MD  ticagrelor (BRILINTA) 90 MG TABS tablet Take 1 tablet (90 mg total) by mouth 2 (two) times daily. 07/18/15  Yes Demetrios Loll, MD  tiotropium (SPIRIVA) 18 MCG inhalation capsule Place 18 mcg into inhaler and inhale daily.   Yes Historical Provider, MD  VENTOLIN HFA 108 (90 BASE) MCG/ACT inhaler Inhale 1-2 puffs into the lungs as needed. 06/24/15  Yes Historical Provider, MD      Review of Systems  Constitutional: Positive for fatigue. Negative for appetite change.  HENT: Negative for congestion, postnasal drip and sore throat.   Eyes: Negative.   Respiratory: Positive for cough (dry cough) and shortness of breath (wearing oxygen at 1.5 liters). Negative for chest tightness and wheezing.   Cardiovascular: Positive for leg swelling. Negative for chest pain (none since stent placed) and palpitations.  Gastrointestinal: Negative for abdominal pain and abdominal distention.  Endocrine: Negative.   Genitourinary: Negative.   Musculoskeletal: Negative for back pain and neck pain.  Skin: Negative.   Allergic/Immunologic: Negative.   Neurological: Positive for dizziness (no falls) and headaches (dull).  Hematological: Negative for adenopathy. Bruises/bleeds easily.  Psychiatric/Behavioral: Positive for agitation. Negative for sleep disturbance (sleeping on  2 pillows). The patient is not nervous/anxious.        Objective:   Physical Exam  Constitutional: He is oriented to person, place, and time. He appears well-developed and well-nourished.  HENT:  Head: Normocephalic and atraumatic.  Eyes: Conjunctivae are normal. Pupils are equal, round, and reactive to light.  Neck: Normal range of motion. Neck supple.  Cardiovascular: Regular rhythm.  Bradycardia present.   Pulmonary/Chest: Effort normal. He has no wheezes. He has no rales.  Abdominal: Soft. He exhibits no distension. There is no tenderness.  Musculoskeletal: He  exhibits edema (1+ pitting edema in bilateral lower legs). He exhibits no tenderness.  Neurological: He is alert and oriented to person, place, and time.  Skin: Skin is warm and dry.  Psychiatric: He has a normal mood and affect. His behavior is normal. Thought content normal.  Nursing note and vitals reviewed.   BP 123/47 mmHg  Pulse 58  Resp 20  Ht 6\' 2"  (1.88 m)  Wt 236 lb (107.049 kg)  BMI 30.29 kg/m2  SpO2 99%       Assessment & Plan:  1: Chronic heart failure with preserved ejection fraction- Patient presents after not being here for 5 months after a hospitalization requiring stent placement. He is currently wearing oxygen at 1.5 liters around the clock but isn't happy about having to wear it. He has continued to weigh himself and by our scale, he's lost 14 pounds since March 2016. Reminded to call for an overnight weight gain of >2 pounds or a weekly weight gain of >5 pounds. He is not adding any salt to his food and his wife tries to closely read food labels. Tries to be as active as possible with allowing  for frequent rest periods. Did feel short of breath upon walking into the office but did recover quickly after sitting down for a few minutes. Does have some edema in his legs but he says that he elevates them at home and in the mornings, they look good.  2: HTN- Blood pressure looks good although the bottom number is a little low. Both patient and wife report that his amlodipine was recently stopped by his cardiologist due to hypotension. Returns to his cardiologist on 08/12/15. 3: Diabetes- He says that his fasting glucose was 81. Follows closely with his PCP. 4: Bradycardia- Heart rate is 58 and he's currently on metoprolol. Amlodipine recently stopped so will continue to monitor. Wife says his heart rate tends to be in the 50's.   Return in 2 months or sooner for any questions/problems before then.

## 2015-07-30 ENCOUNTER — Encounter: Payer: Self-pay | Admitting: Family

## 2015-07-30 DIAGNOSIS — R001 Bradycardia, unspecified: Secondary | ICD-10-CM | POA: Insufficient documentation

## 2015-09-30 ENCOUNTER — Encounter: Payer: Self-pay | Admitting: Family

## 2015-09-30 ENCOUNTER — Ambulatory Visit: Payer: Medicare Other | Attending: Family | Admitting: Family

## 2015-09-30 VITALS — BP 164/56 | HR 55 | Resp 20 | Ht 74.0 in | Wt 244.0 lb

## 2015-09-30 DIAGNOSIS — E785 Hyperlipidemia, unspecified: Secondary | ICD-10-CM | POA: Insufficient documentation

## 2015-09-30 DIAGNOSIS — Z87891 Personal history of nicotine dependence: Secondary | ICD-10-CM | POA: Diagnosis not present

## 2015-09-30 DIAGNOSIS — H9190 Unspecified hearing loss, unspecified ear: Secondary | ICD-10-CM | POA: Insufficient documentation

## 2015-09-30 DIAGNOSIS — I509 Heart failure, unspecified: Secondary | ICD-10-CM | POA: Diagnosis not present

## 2015-09-30 DIAGNOSIS — I2510 Atherosclerotic heart disease of native coronary artery without angina pectoris: Secondary | ICD-10-CM | POA: Diagnosis not present

## 2015-09-30 DIAGNOSIS — Z79899 Other long term (current) drug therapy: Secondary | ICD-10-CM | POA: Diagnosis not present

## 2015-09-30 DIAGNOSIS — R0602 Shortness of breath: Secondary | ICD-10-CM | POA: Diagnosis not present

## 2015-09-30 DIAGNOSIS — R5383 Other fatigue: Secondary | ICD-10-CM | POA: Diagnosis not present

## 2015-09-30 DIAGNOSIS — G629 Polyneuropathy, unspecified: Secondary | ICD-10-CM | POA: Diagnosis not present

## 2015-09-30 DIAGNOSIS — E119 Type 2 diabetes mellitus without complications: Secondary | ICD-10-CM | POA: Diagnosis not present

## 2015-09-30 DIAGNOSIS — I10 Essential (primary) hypertension: Secondary | ICD-10-CM

## 2015-09-30 DIAGNOSIS — J410 Simple chronic bronchitis: Secondary | ICD-10-CM

## 2015-09-30 DIAGNOSIS — I5032 Chronic diastolic (congestive) heart failure: Secondary | ICD-10-CM

## 2015-09-30 DIAGNOSIS — J449 Chronic obstructive pulmonary disease, unspecified: Secondary | ICD-10-CM | POA: Diagnosis not present

## 2015-09-30 NOTE — Patient Instructions (Signed)
Continue weighing daily and call for an overnight weight gain of > 2 pounds or a weekly weight gain of >5 pounds. 

## 2015-09-30 NOTE — Progress Notes (Signed)
Subjective:    Patient ID: Justin Leonard, male    DOB: 11-22-1927, 81 y.o.   MRN: [id number]  Congestive Heart Failure Presents for follow-up visit. The disease course has been stable. Associated symptoms include fatigue and shortness of breath. Pertinent negatives include no abdominal pain, chest pain, chest pressure, edema, orthopnea or palpitations. The symptoms have been stable. Past treatments include ACE inhibitors, beta blockers, salt and fluid restriction and oxygen. The treatment provided moderate relief. Compliance with prior treatments has been good. His past medical history is significant for CAD, chronic lung disease, DM and HTN. Compliance with total regimen is 76-100%.  Hypertension This is a chronic problem. The current episode started more than 1 year ago. The problem has been waxing and waning since onset. Associated symptoms include malaise/fatigue and shortness of breath. Pertinent negatives include no anxiety, chest pain, headaches, neck pain, palpitations or peripheral edema. There are no associated agents to hypertension. Risk factors for coronary artery disease include diabetes mellitus, dyslipidemia and male gender. Past treatments include diuretics, lifestyle changes, beta blockers and ACE inhibitors. The current treatment provides moderate improvement. There are no compliance problems.  Hypertensive end-organ damage includes heart failure.   Past Medical History  Diagnosis Date  . CHF (congestive heart failure) ([place])   . Hypertension   . Diabetes mellitus without complication ([place])   . Coronary artery disease   . Hyperlipidemia   . Deafness   . Peripheral neuropathy ([place])   . Back pain   . Umbilical hernia   . Depression   . Melena   . COPD (chronic obstructive pulmonary disease) ([place])   . Skin cancer     Past Surgical History  Procedure Laterality Date  . Coronary artery bypass graft    . Cardiac catheterization Left 07/17/2015    Procedure: Right/Left  Heart Cath and Coronary Angiography;  Surgeon: [name], MD;  Location: [facility];  Service: Cardiovascular;  Laterality: Left;  . Cardiac catheterization N/A 07/17/2015    Procedure: Coronary Stent Intervention;  Surgeon: [name], MD;  Location: [facility];  Service: Cardiovascular;  Laterality: N/A;  . Coronary angioplasty      No family history on file.  Social History  Substance Use Topics  . Smoking status: Former Smoker -- 3.00 packs/day for 30 years    Types: Cigarettes    Quit date: 04/16/1978  . Smokeless tobacco: Never Used     Comment: Quit smoking 1979  . Alcohol Use: No    No Known Allergies  Prior to Admission medications   Medication Sig Start Date End Date Taking? Authorizing Provider  acetaminophen (TYLENOL) 325 MG tablet Take 650 mg by mouth every 6 (six) hours as needed.   Yes Historical Provider, MD  aspirin EC 81 MG tablet Take 81 mg by mouth daily.   Yes Historical Provider, MD  FLUoxetine (PROZAC) 20 MG capsule Take 20 mg by mouth daily.   Yes Historical Provider, MD  furosemide (LASIX) 40 MG tablet Take 1 tablet (40 mg total) by mouth 2 (two) times daily. 07/18/15  Yes Demetrios Loll, MD  glyBURIDE (DIABETA) 5 MG tablet Take 5 mg by mouth 2 (two) times daily with a meal.   Yes Historical Provider, MD  lisinopril (PRINIVIL,ZESTRIL) 40 MG tablet Take 40 mg by mouth daily.   Yes Historical Provider, MD  lovastatin (MEVACOR) 20 MG tablet Take 20 mg by mouth at bedtime.   Yes Historical Provider, MD  metFORMIN (GLUCOPHAGE)  500 MG tablet Take 500 mg by mouth 2 (two) times daily with a meal. 2 tablets in AM and 1 tablet in PM   Yes Historical Provider, MD  metoprolol (LOPRESSOR) 50 MG tablet Take 50 mg by mouth 2 (two) times daily.   Yes Historical Provider, MD  nitroGLYCERIN (NITROSTAT) 0.4 MG SL tablet Place 1 tablet under the tongue every 5 (five) minutes as needed. 07/15/15 07/14/16 Yes Historical Provider, MD  pantoprazole (PROTONIX)  40 MG tablet Take 40 mg by mouth daily.   Yes Historical Provider, MD  ticagrelor (BRILINTA) 90 MG TABS tablet Take 1 tablet (90 mg total) by mouth 2 (two) times daily. 07/18/15  Yes Demetrios Loll, MD  tiotropium (SPIRIVA) 18 MCG inhalation capsule Place 18 mcg into inhaler and inhale daily.   Yes Historical Provider, MD  VENTOLIN HFA 108 (90 BASE) MCG/ACT inhaler Inhale 1-2 puffs into the lungs as needed. 06/24/15  Yes Historical Provider, MD      Review of Systems  Constitutional: Positive for malaise/fatigue and fatigue. Negative for appetite change.  HENT: Positive for postnasal drip. Negative for congestion and sore throat.   Eyes: Negative.   Respiratory: Positive for shortness of breath. Negative for chest tightness.   Cardiovascular: Negative for chest pain, palpitations and leg swelling.  Gastrointestinal: Negative for abdominal pain and abdominal distention.  Endocrine: Negative.   Genitourinary: Negative.   Musculoskeletal: Positive for back pain (chronic). Negative for neck pain.  Skin: Negative.   Allergic/Immunologic: Negative.   Neurological: Positive for dizziness and light-headedness. Negative for headaches.  Hematological: Negative for adenopathy. Bruises/bleeds easily.  Psychiatric/Behavioral: Negative for sleep disturbance (sleeping with 2 L of oxygen and 2 pillows) and dysphoric mood. The patient is not nervous/anxious.        Objective:   Physical Exam  Constitutional: He is oriented to person, place, and time. He appears well-developed and well-nourished.  HENT:  Head: Normocephalic and atraumatic.  Eyes: Conjunctivae are normal. Pupils are equal, round, and reactive to light.  Neck: Normal range of motion. Neck supple.  Cardiovascular: Regular rhythm.  Bradycardia present.   Pulmonary/Chest: Effort normal. He has no wheezes. He has no rales.  Abdominal: Soft. He exhibits no distension. There is no tenderness.  Musculoskeletal: He exhibits no edema or tenderness.   Neurological: He is alert and oriented to person, place, and time.  Skin: Skin is warm. Bruising (bilateral forearms) noted.  Psychiatric: He has a normal mood and affect. His behavior is normal. Thought content normal.  Nursing note and vitals reviewed.   BP 164/56 mmHg  Pulse 55  Resp 20  Ht 6\' 2"  (1.88 m)  Wt 244 lb (110.678 kg)  BMI 31.31 kg/m2  SpO2 96%       Assessment & Plan:  1: Chronic heart failure with preserved ejection fraction- Patient presents with some fatigue and shortness of breath upon exertion. He says that he was a little short of breath upon walking into the office but once he sat down for a few minutes, his breathing improved. Says that on rare occasions, he will get some swelling in his legs but it goes down overnight. Doesn't elevate them much during the day. Continues to weigh himself at home and says that his home weight ranges from 234-237. If he has an overnight weight gain of >2 pounds, he will take an additional furosemide tablet. By our scale, he has gained 8 pounds since he was last here on 07/29/15. He does say that his appetite is "  great" and feels like he is overeating. Is monitoring his sodium intake and doesn't add any salt to his food. Sees his cardiologist on 10/02/15. Reports receiving his flu vaccine already.  2: HTN- Blood pressure mildly elevated here but it has been normal at previous visits. Will continue to monitor. 3: COPD- Does endorse shortness of breath and does continue to use his inhalers. Overall feels like his breathing has improved. Does wear his oxygen at bedtime at 2L. 4: Diabetes- Patient says that his fasting glucose at home was 132 and he follows closely with his PCP regarding this.  Return in 3 months or sooner for any questions/problems before then.

## 2015-10-01 ENCOUNTER — Encounter: Payer: Self-pay | Admitting: Family

## 2015-10-13 DIAGNOSIS — E11649 Type 2 diabetes mellitus with hypoglycemia without coma: Secondary | ICD-10-CM | POA: Insufficient documentation

## 2015-11-05 ENCOUNTER — Emergency Department: Payer: Medicare Other

## 2015-11-05 ENCOUNTER — Inpatient Hospital Stay
Admission: EM | Admit: 2015-11-05 | Discharge: 2015-11-14 | DRG: 377 | Disposition: A | Discharge status: Skilled Nursing Facility | Payer: Medicare Other | Attending: Internal Medicine | Admitting: Internal Medicine

## 2015-11-05 ENCOUNTER — Encounter: Payer: Self-pay | Admitting: *Deleted

## 2015-11-05 DIAGNOSIS — Z955 Presence of coronary angioplasty implant and graft: Secondary | ICD-10-CM

## 2015-11-05 DIAGNOSIS — D500 Iron deficiency anemia secondary to blood loss (chronic): Secondary | ICD-10-CM | POA: Diagnosis present

## 2015-11-05 DIAGNOSIS — Z7982 Long term (current) use of aspirin: Secondary | ICD-10-CM

## 2015-11-05 DIAGNOSIS — J449 Chronic obstructive pulmonary disease, unspecified: Secondary | ICD-10-CM | POA: Diagnosis present

## 2015-11-05 DIAGNOSIS — E1142 Type 2 diabetes mellitus with diabetic polyneuropathy: Secondary | ICD-10-CM | POA: Diagnosis present

## 2015-11-05 DIAGNOSIS — I4891 Unspecified atrial fibrillation: Secondary | ICD-10-CM | POA: Diagnosis present

## 2015-11-05 DIAGNOSIS — D62 Acute posthemorrhagic anemia: Secondary | ICD-10-CM | POA: Diagnosis present

## 2015-11-05 DIAGNOSIS — T508X5A Adverse effect of diagnostic agents, initial encounter: Secondary | ICD-10-CM | POA: Diagnosis not present

## 2015-11-05 DIAGNOSIS — N170 Acute kidney failure with tubular necrosis: Secondary | ICD-10-CM | POA: Diagnosis not present

## 2015-11-05 DIAGNOSIS — Z794 Long term (current) use of insulin: Secondary | ICD-10-CM

## 2015-11-05 DIAGNOSIS — Z79899 Other long term (current) drug therapy: Secondary | ICD-10-CM

## 2015-11-05 DIAGNOSIS — I130 Hypertensive heart and chronic kidney disease with heart failure and stage 1 through stage 4 chronic kidney disease, or unspecified chronic kidney disease: Secondary | ICD-10-CM | POA: Diagnosis present

## 2015-11-05 DIAGNOSIS — K5900 Constipation, unspecified: Secondary | ICD-10-CM | POA: Diagnosis present

## 2015-11-05 DIAGNOSIS — K922 Gastrointestinal hemorrhage, unspecified: Secondary | ICD-10-CM | POA: Diagnosis present

## 2015-11-05 DIAGNOSIS — K921 Melena: Principal | ICD-10-CM | POA: Diagnosis present

## 2015-11-05 DIAGNOSIS — Z951 Presence of aortocoronary bypass graft: Secondary | ICD-10-CM | POA: Diagnosis not present

## 2015-11-05 DIAGNOSIS — I5023 Acute on chronic systolic (congestive) heart failure: Secondary | ICD-10-CM | POA: Diagnosis present

## 2015-11-05 DIAGNOSIS — N141 Nephropathy induced by other drugs, medicaments and biological substances: Secondary | ICD-10-CM | POA: Diagnosis not present

## 2015-11-05 DIAGNOSIS — R109 Unspecified abdominal pain: Secondary | ICD-10-CM

## 2015-11-05 DIAGNOSIS — N179 Acute kidney failure, unspecified: Secondary | ICD-10-CM | POA: Diagnosis not present

## 2015-11-05 DIAGNOSIS — R6521 Severe sepsis with septic shock: Secondary | ICD-10-CM | POA: Diagnosis not present

## 2015-11-05 DIAGNOSIS — I248 Other forms of acute ischemic heart disease: Secondary | ICD-10-CM | POA: Diagnosis present

## 2015-11-05 DIAGNOSIS — I4892 Unspecified atrial flutter: Secondary | ICD-10-CM | POA: Diagnosis present

## 2015-11-05 DIAGNOSIS — K810 Acute cholecystitis: Secondary | ICD-10-CM | POA: Diagnosis present

## 2015-11-05 DIAGNOSIS — R1011 Right upper quadrant pain: Secondary | ICD-10-CM

## 2015-11-05 DIAGNOSIS — R112 Nausea with vomiting, unspecified: Secondary | ICD-10-CM

## 2015-11-05 DIAGNOSIS — R140 Abdominal distension (gaseous): Secondary | ICD-10-CM

## 2015-11-05 DIAGNOSIS — K219 Gastro-esophageal reflux disease without esophagitis: Secondary | ICD-10-CM | POA: Diagnosis present

## 2015-11-05 DIAGNOSIS — Z85828 Personal history of other malignant neoplasm of skin: Secondary | ICD-10-CM | POA: Diagnosis not present

## 2015-11-05 DIAGNOSIS — N138 Other obstructive and reflux uropathy: Secondary | ICD-10-CM | POA: Diagnosis present

## 2015-11-05 DIAGNOSIS — I2510 Atherosclerotic heart disease of native coronary artery without angina pectoris: Secondary | ICD-10-CM | POA: Diagnosis present

## 2015-11-05 DIAGNOSIS — E785 Hyperlipidemia, unspecified: Secondary | ICD-10-CM | POA: Diagnosis present

## 2015-11-05 DIAGNOSIS — R338 Other retention of urine: Secondary | ICD-10-CM | POA: Diagnosis not present

## 2015-11-05 DIAGNOSIS — A4151 Sepsis due to Escherichia coli [E. coli]: Secondary | ICD-10-CM | POA: Diagnosis not present

## 2015-11-05 DIAGNOSIS — H9190 Unspecified hearing loss, unspecified ear: Secondary | ICD-10-CM | POA: Diagnosis present

## 2015-11-05 DIAGNOSIS — R0602 Shortness of breath: Secondary | ICD-10-CM

## 2015-11-05 DIAGNOSIS — R079 Chest pain, unspecified: Secondary | ICD-10-CM

## 2015-11-05 DIAGNOSIS — N401 Enlarged prostate with lower urinary tract symptoms: Secondary | ICD-10-CM | POA: Diagnosis present

## 2015-11-05 DIAGNOSIS — K8000 Calculus of gallbladder with acute cholecystitis without obstruction: Secondary | ICD-10-CM | POA: Diagnosis not present

## 2015-11-05 LAB — PREPARE RBC (CROSSMATCH)

## 2015-11-05 LAB — CBC
HCT: 22.8 % — ABNORMAL LOW (ref 40.0–52.0)
Hemoglobin: 7.1 g/dL — ABNORMAL LOW (ref 13.0–18.0)
MCH: 24.8 pg — AB (ref 26.0–34.0)
MCHC: 31 g/dL — AB (ref 32.0–36.0)
MCV: 79.9 fL — AB (ref 80.0–100.0)
PLATELETS: 362 10*3/uL (ref 150–440)
RBC: 2.85 MIL/uL — ABNORMAL LOW (ref 4.40–5.90)
RDW: 15.3 % — AB (ref 11.5–14.5)
WBC: 9.4 10*3/uL (ref 3.8–10.6)

## 2015-11-05 LAB — BASIC METABOLIC PANEL
Anion gap: 6 (ref 5–15)
BUN: 22 mg/dL — AB (ref 6–20)
CHLORIDE: 106 mmol/L (ref 101–111)
CO2: 27 mmol/L (ref 22–32)
CREATININE: 1.1 mg/dL (ref 0.61–1.24)
Calcium: 9.8 mg/dL (ref 8.9–10.3)
GFR calc Af Amer: >60 mL/min (ref >60)
GFR calc non Af Amer: 59 mL/min — ABNORMAL LOW (ref >60)
GLUCOSE: 180 mg/dL — AB (ref 65–99)
Potassium: 3.8 mmol/L (ref 3.5–5.1)
Sodium: 139 mmol/L (ref 135–145)

## 2015-11-05 LAB — IRON AND TIBC
Iron: 23 ug/dL — ABNORMAL LOW (ref 45–182)
SATURATION RATIOS: 6 % — AB (ref 17.9–39.5)
TIBC: 412 ug/dL (ref 250–450)
UIBC: 389 ug/dL

## 2015-11-05 LAB — FOLATE: Folate: 12 ng/mL (ref >5.9)

## 2015-11-05 LAB — GLUCOSE, CAPILLARY
Glucose-Capillary: 194 mg/dL — ABNORMAL HIGH (ref 65–99)
Glucose-Capillary: 159 mg/dL — ABNORMAL HIGH (ref 65–99)
Glucose-Capillary: 134 mg/dL — ABNORMAL HIGH (ref 65–99)

## 2015-11-05 LAB — FERRITIN: FERRITIN: 12 ng/mL — AB (ref 24–336)

## 2015-11-05 LAB — VITAMIN B12: VITAMIN B 12: 132 pg/mL — AB (ref 180–914)

## 2015-11-05 LAB — TROPONIN I
Troponin I: <0.03 ng/mL (ref <0.031)
Troponin I: 0.03 ng/mL (ref <0.031)
Troponin I: 0.06 ng/mL — ABNORMAL HIGH (ref <0.031)

## 2015-11-05 LAB — TSH: TSH: 3.455 u[IU]/mL (ref 0.350–4.500)

## 2015-11-05 LAB — HEMOGLOBIN A1C: Hgb A1c MFr Bld: 5.4 % (ref 4.0–6.0)

## 2015-11-05 LAB — HEMOGLOBIN: Hemoglobin: 8.8 g/dL — ABNORMAL LOW (ref 13.0–18.0)

## 2015-11-05 LAB — ABO/RH: ABO/RH(D): O POS

## 2015-11-05 MED ORDER — PROMETHAZINE HCL 25 MG/ML IJ SOLN
12.5000 mg | Freq: Four times a day (QID) | INTRAMUSCULAR | Status: DC | PRN
  Administered 2015-11-05: 12.5 mg via INTRAVENOUS
  Filled 2015-11-05: qty 1

## 2015-11-05 MED ORDER — ACETAMINOPHEN 325 MG PO TABS
650.0000 mg | ORAL_TABLET | Freq: Four times a day (QID) | ORAL | Status: DC | PRN
  Filled 2015-11-05: qty 2

## 2015-11-05 MED ORDER — ONDANSETRON HCL 4 MG/2ML IJ SOLN
4.0000 mg | Freq: Four times a day (QID) | INTRAMUSCULAR | Status: DC | PRN
  Administered 2015-11-05 – 2015-11-09 (×4): 4 mg via INTRAVENOUS
  Filled 2015-11-05 (×4): qty 2

## 2015-11-05 MED ORDER — METOPROLOL TARTRATE 50 MG PO TABS
50.0000 mg | ORAL_TABLET | Freq: Two times a day (BID) | ORAL | Status: DC
  Administered 2015-11-05 – 2015-11-09 (×6): 50 mg via ORAL
  Filled 2015-11-05 (×8): qty 1

## 2015-11-05 MED ORDER — DOCUSATE SODIUM 100 MG PO CAPS
100.0000 mg | ORAL_CAPSULE | Freq: Two times a day (BID) | ORAL | Status: DC
  Administered 2015-11-05 – 2015-11-12 (×13): 100 mg via ORAL
  Filled 2015-11-05 (×14): qty 1

## 2015-11-05 MED ORDER — NITROGLYCERIN 0.4 MG SL SUBL: 0.4000 mg | SUBLINGUAL_TABLET | SUBLINGUAL | Status: DC | PRN

## 2015-11-05 MED ORDER — FLUOXETINE HCL 20 MG PO CAPS
20.0000 mg | ORAL_CAPSULE | Freq: Every day | ORAL | Status: DC
  Administered 2015-11-05 – 2015-11-14 (×9): 20 mg via ORAL
  Filled 2015-11-05 (×10): qty 1

## 2015-11-05 MED ORDER — ACETAMINOPHEN 650 MG RE SUPP
650.0000 mg | Freq: Four times a day (QID) | RECTAL | Status: DC | PRN
  Administered 2015-11-06: 650 mg via RECTAL
  Filled 2015-11-05: qty 1

## 2015-11-05 MED ORDER — PANTOPRAZOLE SODIUM 40 MG PO TBEC: 40.0000 mg | DELAYED_RELEASE_TABLET | Freq: Every day | ORAL | Status: DC

## 2015-11-05 MED ORDER — SODIUM CHLORIDE 0.9 % IV SOLN
Freq: Once | INTRAVENOUS | Status: AC
  Administered 2015-11-05: 10:00:00 via INTRAVENOUS

## 2015-11-05 MED ORDER — INSULIN ASPART 100 UNIT/ML ~~LOC~~ SOLN
0.0000 [IU] | Freq: Three times a day (TID) | SUBCUTANEOUS | Status: DC
  Administered 2015-11-05 – 2015-11-06 (×2): 3 [IU] via SUBCUTANEOUS
  Filled 2015-11-05 (×2): qty 3

## 2015-11-05 MED ORDER — PANTOPRAZOLE SODIUM 40 MG IV SOLR
40.0000 mg | Freq: Two times a day (BID) | INTRAVENOUS | Status: DC
  Administered 2015-11-05 – 2015-11-14 (×19): 40 mg via INTRAVENOUS
  Filled 2015-11-05 (×19): qty 40

## 2015-11-05 MED ORDER — TIOTROPIUM BROMIDE MONOHYDRATE 18 MCG IN CAPS
1.0000 | ORAL_CAPSULE | Freq: Every day | RESPIRATORY_TRACT | Status: DC
  Administered 2015-11-05 – 2015-11-14 (×7): 18 ug via RESPIRATORY_TRACT
  Filled 2015-11-05 (×3): qty 5

## 2015-11-05 MED ORDER — ALBUTEROL SULFATE (2.5 MG/3ML) 0.083% IN NEBU: 3.0000 mL | INHALATION_SOLUTION | RESPIRATORY_TRACT | Status: DC | PRN

## 2015-11-05 MED ORDER — LISINOPRIL 20 MG PO TABS
40.0000 mg | ORAL_TABLET | Freq: Every day | ORAL | Status: DC
  Administered 2015-11-05: 40 mg via ORAL
  Filled 2015-11-05 (×2): qty 2

## 2015-11-05 MED ORDER — HYDRALAZINE HCL 20 MG/ML IJ SOLN: 10.0000 mg | Freq: Four times a day (QID) | INTRAMUSCULAR | Status: DC | PRN

## 2015-11-05 MED ORDER — FUROSEMIDE 10 MG/ML IJ SOLN
40.0000 mg | Freq: Two times a day (BID) | INTRAMUSCULAR | Status: DC
  Administered 2015-11-05 (×2): 40 mg via INTRAVENOUS
  Filled 2015-11-05 (×2): qty 4

## 2015-11-05 MED ORDER — FUROSEMIDE 20 MG PO TABS: 20.0000 mg | ORAL_TABLET | Freq: Every day | ORAL | Status: DC | PRN

## 2015-11-05 MED ORDER — SODIUM CHLORIDE 0.9 % IJ SOLN
3.0000 mL | Freq: Two times a day (BID) | INTRAMUSCULAR | Status: DC
  Administered 2015-11-05 – 2015-11-14 (×16): 3 mL via INTRAVENOUS

## 2015-11-05 MED ORDER — ONDANSETRON HCL 4 MG PO TABS: 4.0000 mg | ORAL_TABLET | Freq: Four times a day (QID) | ORAL | Status: DC | PRN

## 2015-11-05 MED ORDER — MORPHINE SULFATE (PF) 2 MG/ML IV SOLN: 2.0000 mg | INTRAVENOUS | Status: DC | PRN

## 2015-11-05 MED ORDER — ASPIRIN EC 81 MG PO TBEC
81.0000 mg | DELAYED_RELEASE_TABLET | Freq: Every day | ORAL | Status: DC
  Administered 2015-11-05: 81 mg via ORAL
  Filled 2015-11-05: qty 1

## 2015-11-05 MED ORDER — PRAVASTATIN SODIUM 20 MG PO TABS
20.0000 mg | ORAL_TABLET | Freq: Every day | ORAL | Status: DC
  Administered 2015-11-05 – 2015-11-13 (×7): 20 mg via ORAL
  Filled 2015-11-05 (×8): qty 1

## 2015-11-05 NOTE — ED Provider Notes (Signed)
[facility] Emergency Department Provider Note  ____________________________________________  Time seen: 3: 10 AM  I have reviewed the triage vital signs and the nursing notes.   HISTORY  Chief Complaint Chest Pain     HPI Justin Leonard is a 81 y.o. male presents with chest pain since 10 PM last night which persisted until this morning. Patient states that he took 3 sublingual nitroglycerin as well as 324 mg of aspirin and 2 Tums without any relief. Of note the patient is a history of coronary artery bypass graft multiple vessels in 1994 and subsequently a cardiac stent placed August 2016 by Dr. Humphrey Rolls.Patient also admits to dyspnea however no dizziness no nausea or vomiting.     Past Medical History  Diagnosis Date  . CHF (congestive heart failure) ([place])   . Hypertension   . Diabetes mellitus without complication ([place])   . Coronary artery disease   . Hyperlipidemia   . Deafness   . Peripheral neuropathy ([place])   . Back pain   . Umbilical hernia   . Depression   . Melena   . COPD (chronic obstructive pulmonary disease) ([place])   . Skin cancer     Patient Active Problem List   Diagnosis Date Noted  . Bradycardia 07/30/2015  . COPD exacerbation ([place]) 07/15/2015  . Acute respiratory failure with hypoxia ([place]) 07/15/2015  . Acute on chronic diastolic CHF (congestive heart failure) ([place]) 07/15/2015  . Elevated troponin 07/15/2015  . Chronic diastolic heart failure ([place]) 04/17/2015  . HTN (hypertension) 04/17/2015  . Diabetes ([place]) 04/17/2015  . COPD (chronic obstructive pulmonary disease) ([place]) 04/17/2015    Past Surgical History  Procedure Laterality Date  . Coronary artery bypass graft    . Cardiac catheterization Left 07/17/2015    Procedure: Right/Left Heart Cath and Coronary Angiography;  Surgeon: [name], MD;  Location: [facility];  Service: Cardiovascular;  Laterality: Left;  . Cardiac catheterization N/A 07/17/2015   Procedure: Coronary Stent Intervention;  Surgeon: [name], MD;  Location: [facility];  Service: Cardiovascular;  Laterality: N/A;  . Coronary angioplasty      Current Outpatient Rx  Name  Route  Sig  Dispense  Refill  . acetaminophen (TYLENOL) 325 MG tablet   Oral   Take 650 mg by mouth every 6 (six) hours as needed.         Marland Kitchen aspirin EC 81 MG tablet   Oral   Take 81 mg by mouth daily.         Marland Kitchen FLUoxetine (PROZAC) 20 MG capsule   Oral   Take 20 mg by mouth daily.         . furosemide (LASIX) 20 MG tablet   Oral   Take 20-40 mg by mouth daily as needed for fluid.          Marland Kitchen glyBURIDE (DIABETA) 5 MG tablet   Oral   Take 5 mg by mouth 2 (two) times daily with a meal.         . lisinopril (PRINIVIL,ZESTRIL) 40 MG tablet   Oral   Take 40 mg by mouth daily.         Marland Kitchen lovastatin (MEVACOR) 20 MG tablet   Oral   Take 20 mg by mouth at bedtime.         . metFORMIN (GLUCOPHAGE) 500 MG tablet   Oral   Take 500 mg by mouth 2 (two) times daily with a meal. 2  tablets in AM and 1 tablet in PM         . metoprolol (LOPRESSOR) 50 MG tablet   Oral   Take 50 mg by mouth 2 (two) times daily.         . nitroGLYCERIN (NITROSTAT) 0.4 MG SL tablet   Sublingual   Place 1 tablet under the tongue every 5 (five) minutes as needed.         . pantoprazole (PROTONIX) 40 MG tablet   Oral   Take 40 mg by mouth daily.         . ticagrelor (BRILINTA) 90 MG TABS tablet   Oral   Take 1 tablet (90 mg total) by mouth 2 (two) times daily.   60 tablet   0   . Tiotropium Bromide Monohydrate (SPIRIVA RESPIMAT) 2.5 MCG/ACT AERS   Inhalation   Inhale 2 puffs into the lungs daily.         . VENTOLIN HFA 108 (90 BASE) MCG/ACT inhaler   Inhalation   Inhale 1-2 puffs into the lungs as needed.           Dispense as written.   . furosemide (LASIX) 40 MG tablet   Oral   Take 1 tablet (40 mg total) by mouth 2 (two) times daily. Patient not taking:  Reported on 11/05/2015   60 tablet   0     Allergies No known drug allergies   Family History  Problem Relation Age of Onset  . Anemia Neg Hx   . Arrhythmia Neg Hx   . Asthma Neg Hx   . Clotting disorder Neg Hx   . Fainting Neg Hx   . Heart attack Neg Hx   . Heart disease Neg Hx   . Heart failure Neg Hx   . Hyperlipidemia Neg Hx   . Hypertension Neg Hx     Social History Social History  Substance Use Topics  . Smoking status: Former Smoker -- 3.00 packs/day for 30 years    Types: Cigarettes    Quit date: 04/16/1978  . Smokeless tobacco: Never Used     Comment: Quit smoking 1979  . Alcohol Use: No    Review of Systems  Constitutional: Negative for fever. Eyes: Negative for visual changes. ENT: Negative for sore throat. Cardiovascular: Negative for chest pain. Respiratory: Negative for shortness of breath. Gastrointestinal: Negative for abdominal pain, vomiting and diarrhea. Genitourinary: Negative for dysuria. Musculoskeletal: Negative for back pain. Skin: Negative for rash. Neurological: Negative for headaches, focal weakness or numbness.   10-point ROS otherwise negative.  ____________________________________________   PHYSICAL EXAM:  VITAL SIGNS: ED Triage Vitals  Enc Vitals Group     BP 11/05/15 0307 165/68 mmHg     Pulse Rate 11/05/15 0307 62     Resp 11/05/15 0307 15     Temp 11/05/15 0311 97.7 F (36.5 C)     Temp Source 11/05/15 0311 Oral     SpO2 11/05/15 0306 97 %     Weight 11/05/15 0311 230 lb (104.327 kg)     Height 11/05/15 0311 6\' 3"  (1.905 m)     Head Cir --      Peak Flow --      Pain Score 11/05/15 0312 2     Pain Loc --      Pain Edu? --      Excl. in [place]? --      Constitutional: Alert and oriented. Well appearing and in no distress. Eyes: Conjunctivae are normal.  PERRL. Normal extraocular movements. ENT   Head: Normocephalic and atraumatic.   Nose: No congestion/rhinnorhea.   Mouth/Throat: Mucous membranes are  moist.   Neck: No stridor. Hematological/Lymphatic/Immunilogical: No cervical lymphadenopathy. Cardiovascular: Normal rate, regular rhythm. Normal and symmetric distal pulses are present in all extremities. No murmurs, rubs, or gallops. Respiratory: Normal respiratory effort without tachypnea nor retractions. Breath sounds are clear and equal bilaterally. No wheezes/rales/rhonchi. Gastrointestinal: Soft and nontender. No distention. There is no CVA tenderness. Genitourinary: deferred Musculoskeletal: Nontender with normal range of motion in all extremities. No joint effusions.  No lower extremity tenderness nor edema. Neurologic:  Normal speech and language. No gross focal neurologic deficits are appreciated. Speech is normal.  Skin:  Skin is warm, dry and intact. No rash noted. Psychiatric: Mood and affect are normal. Speech and behavior are normal. Patient exhibits appropriate insight and judgment.  ____________________________________________    LABS (pertinent positives/negatives)  Labs Reviewed  BASIC METABOLIC PANEL - Abnormal; Notable for the following:    Glucose, Bld 180 (*)    BUN 22 (*)    GFR calc non Af Amer 59 (*)    All other components within normal limits  CBC - Abnormal; Notable for the following:    RBC 2.85 (*)    Hemoglobin 7.1 (*)    HCT 22.8 (*)    MCV 79.9 (*)    MCH 24.8 (*)    MCHC 31.0 (*)    RDW 15.3 (*)    All other components within normal limits  TROPONIN I  TYPE AND SCREEN  ABO/RH  PREPARE RBC (CROSSMATCH)     ____________________________________________   EKG  ED ECG REPORT I, Rithy Mandley, [place] N, the attending physician, personally viewed and interpreted this ECG.   Date: 11/05/2015  EKG Time: 3:09 AM  Rate: 59  Rhythm: Sinus bradycardia  Axis: None  Intervals: Normal  ST&T Change: None   ____________________________________________    RADIOLOGY  DG Chest 2 View (Final result) Result time: 11/05/15 03:55:28   Final  result by Rad Results In Interface (11/05/15 03:55:28)   Narrative:   CLINICAL DATA: Acute onset of mid chest pain. Initial encounter.  EXAM: CHEST 2 VIEW  COMPARISON: Chest radiograph performed 07/15/2015  FINDINGS: The lungs are well-aerated. Vascular congestion is noted. Increased interstitial markings raise concern for mild pulmonary edema, though pneumonia could have a similar appearance. There is no evidence of pleural effusion or pneumothorax.  The heart is normal in size. The patient is status post median sternotomy, with evidence of prior CABG. No acute osseous abnormalities are seen.  IMPRESSION: Vascular congestion noted. Increased interstitial markings raise concern for mild pulmonary edema, though pneumonia could have a similar appearance.   Electronically Signed By: Garald Balding M.D. On: 11/05/2015 03:55       INITIAL IMPRESSION / ASSESSMENT AND PLAN / ED COURSE  Pertinent labs & imaging results that were available during my care of the patient were reviewed by me and considered in my medical decision making (see chart for details).  Given history physical exam concern for possible cardiac etiology of chest pain. In addition patient noted to be anemic hemoglobin of 7.1 most recent comparison 9.5. Patient admits to dark stools  ____________________________________________   FINAL CLINICAL IMPRESSION(S) / ED DIAGNOSES  Final diagnoses:  None  Chest Pain Anemia    Gregor Hams, MD 11/05/15 [phone]

## 2015-11-05 NOTE — ED Notes (Signed)
Pt called EMS due to chest  Pain, pt took, 3 SL nitro,  324 mg ASA, 2 Tums., w/o relief

## 2015-11-05 NOTE — H&P (Signed)
Justin Leonard is an 81 y.o. male.   Chief Complaint: Chest pain HPI: The patient presents to the emergency department complaining of chest pain that began at rest. The patient took aspirin and nitroglycerin at home which dramatically reduced his chest pain. At this moment he complains that he has a dull ache that is "in a band around his chest". He denies associated shortness of breath or diaphoresis. The patient admits that he is nauseous but this has been an intermittent complaint 4 weeks. He states that he first began to have nausea and diarrhea over Thanksgiving when for a period of 3 consecutive days he had very loose dark-colored stools. Since that time he has noticed that his stool has been darker in color than usual but he denies abdominal pain. Laboratory evaluation in the emergency department revealed a 2 g drop in hemoglobin since his last hospital visit which prompted the emergency department staff to call for admission.  Past Medical History  Diagnosis Date  . CHF (congestive heart failure) ([place])   . Hypertension   . Diabetes mellitus without complication ([place])   . Coronary artery disease   . Hyperlipidemia   . Deafness   . Peripheral neuropathy ([place])   . Back pain   . Umbilical hernia   . Depression   . Melena   . COPD (chronic obstructive pulmonary disease) ([place])   . Skin cancer     Past Surgical History  Procedure Laterality Date  . Coronary artery bypass graft    . Cardiac catheterization Left 07/17/2015    Procedure: Right/Left Heart Cath and Coronary Angiography;  Surgeon: [name], MD;  Location: [facility];  Service: Cardiovascular;  Laterality: Left;  . Cardiac catheterization N/A 07/17/2015    Procedure: Coronary Stent Intervention;  Surgeon: [name], MD;  Location: [facility];  Service: Cardiovascular;  Laterality: N/A;  . Coronary angioplasty      Family History  Problem Relation Age of Onset  . Anemia Neg Hx   .  Arrhythmia Neg Hx   . Asthma Neg Hx   . Clotting disorder Neg Hx   . Fainting Neg Hx   . Heart attack Neg Hx   . Heart disease Neg Hx   . Heart failure Neg Hx   . Hyperlipidemia Neg Hx   . Hypertension Father   . CVA Father    Social History:  reports that he quit smoking about 37 years ago. His smoking use included Cigarettes. He has a 90 pack-year smoking history. He has never used smokeless tobacco. He reports that he does not drink alcohol or use illicit drugs.  Allergies: No Known Allergies  Prior to Admission medications   Medication Sig Start Date End Date Taking? Authorizing Provider  acetaminophen (TYLENOL) 325 MG tablet Take 650 mg by mouth every 6 (six) hours as needed.   Yes Historical Provider, MD  aspirin EC 81 MG tablet Take 81 mg by mouth daily.   Yes Historical Provider, MD  FLUoxetine (PROZAC) 20 MG capsule Take 20 mg by mouth daily.   Yes Historical Provider, MD  furosemide (LASIX) 20 MG tablet Take 20-40 mg by mouth daily as needed for fluid.    Yes Historical Provider, MD  glyBURIDE (DIABETA) 5 MG tablet Take 5 mg by mouth 2 (two) times daily with a meal.   Yes Historical Provider, MD  lisinopril (PRINIVIL,ZESTRIL) 40 MG tablet Take 40 mg by mouth daily.   Yes Historical Provider, MD  lovastatin (MEVACOR) 20 MG tablet Take 20 mg by mouth at bedtime.   Yes Historical Provider, MD  metFORMIN (GLUCOPHAGE) 500 MG tablet Take 500 mg by mouth 2 (two) times daily with a meal. 2 tablets in AM and 1 tablet in PM   Yes Historical Provider, MD  metoprolol (LOPRESSOR) 50 MG tablet Take 50 mg by mouth 2 (two) times daily.   Yes Historical Provider, MD  nitroGLYCERIN (NITROSTAT) 0.4 MG SL tablet Place 1 tablet under the tongue every 5 (five) minutes as needed. 07/15/15 07/14/16 Yes Historical Provider, MD  pantoprazole (PROTONIX) 40 MG tablet Take 40 mg by mouth daily.   Yes Historical Provider, MD  ticagrelor (BRILINTA) 90 MG TABS tablet Take 1 tablet (90 mg total) by mouth 2 (two)  times daily. 07/18/15  Yes Demetrios Loll, MD  Tiotropium Bromide Monohydrate (SPIRIVA RESPIMAT) 2.5 MCG/ACT AERS Inhale 2 puffs into the lungs daily.   Yes Historical Provider, MD  VENTOLIN HFA 108 (90 BASE) MCG/ACT inhaler Inhale 1-2 puffs into the lungs as needed. 06/24/15  Yes Historical Provider, MD  furosemide (LASIX) 40 MG tablet Take 1 tablet (40 mg total) by mouth 2 (two) times daily. Patient not taking: Reported on 11/05/2015 07/18/15   Demetrios Loll, MD     Results for orders placed or performed during the hospital encounter of 11/05/15 (from the past 48 hour(s))  Basic metabolic panel     Status: Abnormal   Collection Time: 11/05/15  3:19 AM  Result Value Ref Range   Sodium 139 135 - 145 mmol/L   Potassium 3.8 3.5 - 5.1 mmol/L   Chloride 106 101 - 111 mmol/L   CO2 27 22 - 32 mmol/L   Glucose, Bld 180 (H) 65 - 99 mg/dL   BUN 22 (H) 6 - 20 mg/dL   Creatinine, Ser 1.10 0.61 - 1.24 mg/dL   Calcium 9.8 8.9 - 10.3 mg/dL   GFR calc non Af Amer 59 (L) >60 mL/min   GFR calc Af Amer >60 >60 mL/min    Comment: (NOTE) The eGFR has been calculated using the CKD EPI equation. This calculation has not been validated in all clinical situations. eGFR's persistently <60 mL/min signify possible Chronic Kidney Disease.    Anion gap 6 5 - 15  CBC     Status: Abnormal   Collection Time: 11/05/15  3:19 AM  Result Value Ref Range   WBC 9.4 3.8 - 10.6 K/uL   RBC 2.85 (L) 4.40 - 5.90 MIL/uL   Hemoglobin 7.1 (L) 13.0 - 18.0 g/dL   HCT 22.8 (L) 40.0 - 52.0 %   MCV 79.9 (L) 80.0 - 100.0 fL   MCH 24.8 (L) 26.0 - 34.0 pg   MCHC 31.0 (L) 32.0 - 36.0 g/dL   RDW 15.3 (H) 11.5 - 14.5 %   Platelets 362 150 - 440 K/uL  Troponin I     Status: None   Collection Time: 11/05/15  3:19 AM  Result Value Ref Range   Troponin I <0.03 <0.031 ng/mL    Comment:        NO INDICATION OF MYOCARDIAL INJURY.   Type and screen [place]     Status: None   Collection Time: 11/05/15  4:16 AM  Result  Value Ref Range   ABO/RH(D) O POS    Antibody Screen NEG    Sample Expiration 11/08/2015    Dg Chest 2 View  11/05/2015  CLINICAL DATA:  Acute onset of mid chest pain.  Initial encounter. EXAM: CHEST  2 VIEW COMPARISON:  Chest radiograph performed 07/15/2015 FINDINGS: The lungs are well-aerated. Vascular congestion is noted. Increased interstitial markings raise concern for mild pulmonary edema, though pneumonia could have a similar appearance. There is no evidence of pleural effusion or pneumothorax. The heart is normal in size. The patient is status post median sternotomy, with evidence of prior CABG. No acute osseous abnormalities are seen. IMPRESSION: Vascular congestion noted. Increased interstitial markings raise concern for mild pulmonary edema, though pneumonia could have a similar appearance. Electronically Signed   By: Garald Balding M.D.   On: 11/05/2015 03:55    Review of Systems  Constitutional: Negative for fever and chills.  HENT: Negative for sore throat and tinnitus.   Eyes: Negative for blurred vision and redness.  Respiratory: Negative for cough and shortness of breath.   Cardiovascular: Positive for chest pain. Negative for palpitations, orthopnea and PND.  Gastrointestinal: Positive for nausea, diarrhea and melena. Negative for vomiting and abdominal pain.  Genitourinary: Negative for dysuria, urgency and frequency.  Musculoskeletal: Negative for myalgias and joint pain.  Skin: Negative for rash.       No lesions  Neurological: Negative for speech change, focal weakness and weakness.  Endo/Heme/Allergies: Does not bruise/bleed easily.       No temperature intolerance  Psychiatric/Behavioral: Negative for depression and suicidal ideas.    Blood pressure 174/65, pulse 61, temperature 97.7 F (36.5 C), temperature source Oral, resp. rate 17, height '6\' 3"'  (1.905 m), weight 104.327 kg (230 lb), SpO2 100 %. Physical Exam  Nursing note and vitals reviewed. Constitutional:  He is oriented to person, place, and time. He appears well-developed and well-nourished. No distress.  HENT:  Head: Normocephalic and atraumatic.  Mouth/Throat: Oropharynx is clear and moist.  Eyes: Conjunctivae and EOM are normal. Pupils are equal, round, and reactive to light. No scleral icterus.  Neck: Normal range of motion. Neck supple. No JVD present. No tracheal deviation present. No thyromegaly present.  Cardiovascular: Normal rate, regular rhythm and normal heart sounds.  Exam reveals no gallop and no friction rub.   No murmur heard. Respiratory: Effort normal and breath sounds normal.  GI: Soft. Bowel sounds are normal. He exhibits no distension. There is no tenderness.  Ventral hernia; reducible   Genitourinary:  Deferred  Musculoskeletal: Normal range of motion. He exhibits no edema.  Lymphadenopathy:    He has no cervical adenopathy.  Neurological: He is alert and oriented to person, place, and time. No cranial nerve deficit.  Skin: Skin is warm and dry. No rash noted. No erythema.  Multiple skin tags, actinic keratoses and moles over chest and face  Psychiatric: He has a normal mood and affect. His behavior is normal. Judgment and thought content normal.     Assessment/Plan This is an 81 year old Caucasian male with past medical history significant for coronary artery disease status post CABG and PCI now has a GI bleed and symptomatic anemia. 1. GI bleed: Melena indicates upper GI bleed. The patient is on aspirin and Brilinta. I have discontinued the latter for right now. We will transfuse 2 units red blood cells. Check orthostatic blood pressure. Gastroenterology consulted. 2. Chest pain: Likely anginal secondary to anemia. Troponin is negative. Follow cardiac biomarkers. EKG shows no myocardial ischemia at this time. Continue to monitor telemetry. Cardiology consult once the patient is stabilized. 3. Essential hypertension: Continue lisinopril and metoprolol 4. Diabetes  mellitus type 2: Hold oral hypoglycemics. Add sliding scale insulin while the patient is  hospitalized 5. COPD: Stable. Continue inhalers per home regimen 6. Lipidemia: Continue statin therapy 7. DVT prophylaxis: SCDs 8. GI prophylaxis: IV Protonix due to GI bleed The patient is a full code. Time spent on admission orders and patient care approximately 45 minutes  Harrie Foreman 11/05/2015, 5:49 AM

## 2015-11-05 NOTE — Care Management (Signed)
Presents from home with chest pain.  had stent placement in August 2016 and placed on Brilinta.  Has had dark stool since Thanksgiving.  found to have a 2 gram drop in hemoglobin.  Has been evaluated by gi and at present,  Brilinta will have to be on hold for at least 5 days before pursuing EGD.  Patient lives with his wife.  Is still able to drive and has a cane at home if he needs it for ambulation.  Has home 02 through [facility] but uses it "when I need it."  It does sound like patient has a portable concentrator. [facility] is checking on order.

## 2015-11-05 NOTE — Progress Notes (Signed)
Patient has been with nausea and vomiting 4xs. Zofran administered but not helping. MD Jannifer Franklin notified. Will administer Phenergan as ordered per Dr. And continue to monitor.

## 2015-11-05 NOTE — Progress Notes (Signed)
Justin Leonard is a 81 y.o. male patient admitted from ED awake, alert - oriented  X 4 - no acute distress noted.  VSS - Blood pressure 168/69, pulse 84, temperature 98.8 F (37.1 C), temperature source Oral, resp. rate 22, height 6\' 3"  (1.905 m), weight 102.105 kg (225 lb 1.6 oz), SpO2 96 %.    IV in place, occlusive dsg intact without redness.  Orientation to room, and floor completed with information packet given to patient/family. Admission INP armband ID verified with patient/family, and in place.   SR up x 2, fall assessment complete, with patient and family able to verbalize understanding of risk associated with falls, and verbalized understanding to call nsg before up out of bed.  Call light within reach, patient able to voice, and demonstrate understanding.  Skin assessed with Gaspar Bidding., RN. Area of blanchable redness on sacrum. Scattered bruising to bilateral upper extremities.   Will cont to eval and treat per MD orders.  Rachael Fee, RN

## 2015-11-05 NOTE — ED Notes (Signed)
Patient transported to X-ray 

## 2015-11-05 NOTE — Consult Note (Signed)
GI Inpatient Consult Note  Reason for Consult:   Attending Requesting Consult:  History of Present Illness: Justin Leonard is a 81 y.o. male with CAD. On brilinta since Aug after cardiac stent placement. Admitted with weakness, nausea, intermittent diarrhea/melena since Aug. Had CP last night. Getting his 1 unit of PRBC.  Feels better today. Last dose of brilinta last night.  Past Medical History:  Past Medical History  Diagnosis Date  . CHF (congestive heart failure) ([place])   . Hypertension   . Diabetes mellitus without complication ([place])   . Coronary artery disease   . Hyperlipidemia   . Deafness   . Peripheral neuropathy ([place])   . Back pain   . Umbilical hernia   . Depression   . Melena   . COPD (chronic obstructive pulmonary disease) ([place])   . Skin cancer     Problem List: Patient Active Problem List   Diagnosis Date Noted  . GI bleed 11/05/2015  . Bradycardia 07/30/2015  . COPD exacerbation ([place]) 07/15/2015  . Acute respiratory failure with hypoxia ([place]) 07/15/2015  . Acute on chronic diastolic CHF (congestive heart failure) ([place]) 07/15/2015  . Elevated troponin 07/15/2015  . Chronic diastolic heart failure ([place]) 04/17/2015  . HTN (hypertension) 04/17/2015  . Diabetes ([place]) 04/17/2015  . COPD (chronic obstructive pulmonary disease) ([place]) 04/17/2015    Past Surgical History: Past Surgical History  Procedure Laterality Date  . Coronary artery bypass graft    . Cardiac catheterization Left 07/17/2015    Procedure: Right/Left Heart Cath and Coronary Angiography;  Surgeon: [name], MD;  Location: [facility];  Service: Cardiovascular;  Laterality: Left;  . Cardiac catheterization N/A 07/17/2015    Procedure: Coronary Stent Intervention;  Surgeon: [name], MD;  Location: [facility];  Service: Cardiovascular;  Laterality: N/A;  . Coronary angioplasty      Allergies: No Known Allergies  Home Medications: Prescriptions prior to  admission  Medication Sig Dispense Refill Last Dose  . acetaminophen (TYLENOL) 325 MG tablet Take 650 mg by mouth every 6 (six) hours as needed.   PRN at PRN  . aspirin EC 81 MG tablet Take 81 mg by mouth daily.   11/04/2015 at Unknown time  . FLUoxetine (PROZAC) 20 MG capsule Take 20 mg by mouth daily.   11/04/2015 at Unknown time  . furosemide (LASIX) 20 MG tablet Take 20-40 mg by mouth daily as needed for fluid.    11/04/2015 at Unknown time  . glyBURIDE (DIABETA) 5 MG tablet Take 5 mg by mouth 2 (two) times daily with a meal.   11/04/2015 at Unknown time  . lisinopril (PRINIVIL,ZESTRIL) 40 MG tablet Take 40 mg by mouth daily.   11/04/2015 at Unknown time  . lovastatin (MEVACOR) 20 MG tablet Take 20 mg by mouth at bedtime.   11/04/2015 at Unknown time  . metFORMIN (GLUCOPHAGE) 500 MG tablet Take 500 mg by mouth 2 (two) times daily with a meal. 2 tablets in AM and 1 tablet in PM   11/04/2015 at Unknown time  . metoprolol (LOPRESSOR) 50 MG tablet Take 50 mg by mouth 2 (two) times daily.   11/04/2015 at Unknown time  . nitroGLYCERIN (NITROSTAT) 0.4 MG SL tablet Place 1 tablet under the tongue every 5 (five) minutes as needed.   11/05/2015 at Unknown time  . pantoprazole (PROTONIX) 40 MG tablet Take 40 mg by mouth daily.   11/04/2015 at Unknown time  . ticagrelor (BRILINTA) 90 MG TABS  tablet Take 1 tablet (90 mg total) by mouth 2 (two) times daily. 60 tablet 0 11/04/2015 at Unknown time  . Tiotropium Bromide Monohydrate (SPIRIVA RESPIMAT) 2.5 MCG/ACT AERS Inhale 2 puffs into the lungs daily.   11/04/2015 at Unknown time  . VENTOLIN HFA 108 (90 BASE) MCG/ACT inhaler Inhale 1-2 puffs into the lungs as needed.   PRN at PRN  . furosemide (LASIX) 40 MG tablet Take 1 tablet (40 mg total) by mouth 2 (two) times daily. (Patient not taking: Reported on 11/05/2015) 60 tablet 0 Not Taking at Unknown time   Home medication reconciliation was completed with the patient.   Scheduled Inpatient Medications:   . aspirin EC   81 mg Oral Daily  . docusate sodium  100 mg Oral BID  . FLUoxetine  20 mg Oral Daily  . furosemide  40 mg Intravenous Q12H  . lisinopril  40 mg Oral Daily  . metoprolol  50 mg Oral BID  . pantoprazole (PROTONIX) IV  40 mg Intravenous Q12H  . pravastatin  20 mg Oral q1800  . sodium chloride  3 mL Intravenous Q12H  . tiotropium  1 capsule Inhalation Daily    Continuous Inpatient Infusions:     PRN Inpatient Medications:  acetaminophen **OR** acetaminophen, albuterol, hydrALAZINE, morphine injection, nitroGLYCERIN, ondansetron **OR** ondansetron (ZOFRAN) IV  Family History: family history includes CVA in his father; Hypertension in his father. There is no history of Anemia, Arrhythmia, Asthma, Clotting disorder, Fainting, Heart attack, Heart disease, Heart failure, or Hyperlipidemia.  The patient's family history is negative for inflammatory bowel disorders, GI malignancy, or solid organ transplantation.  Social History:   reports that he quit smoking about 37 years ago. His smoking use included Cigarettes. He has a 90 pack-year smoking history. He has never used smokeless tobacco. He reports that he does not drink alcohol or use illicit drugs. The patient denies ETOH, tobacco, or drug use.   Review of Systems: Constitutional: Weight is stable.  Eyes: No changes in vision. ENT: No oral lesions, sore throat.  GI: see HPI.  Heme/Lymph: No easy bruising.  CV: No chest pain.  GU: No hematuria.  Integumentary: No rashes.  Neuro: No headaches.  Psych: No depression/anxiety.  Endocrine: No heat/cold intolerance.  Allergic/Immunologic: No urticaria.  Resp: No cough, SOB.  Musculoskeletal: No joint swelling.    Physical Examination: BP 174/67 mmHg  Pulse 69  Temp(Src) 97.8 F (36.6 C) (Oral)  Resp 19  Ht 6\' 3"  (1.905 m)  Wt 102.105 kg (225 lb 1.6 oz)  BMI 28.14 kg/m2  SpO2 100% Gen: NAD, alert and oriented x 4 HEENT: PEERLA, EOMI, Neck: supple, no JVD or thyromegaly Chest:  CTA bilaterally, no wheezes, crackles, or other adventitious sounds CV: RRR, no m/g/c/r Abd: soft, NT, ND, +BS in all four quadrants; no HSM, guarding, ridigity, or rebound tenderness Ext: no edema, well perfused with 2+ pulses, Skin: no rash or lesions noted Lymph: no LAD  Data: Lab Results  Component Value Date   WBC 9.4 11/05/2015   HGB 7.1* 11/05/2015   HCT 22.8* 11/05/2015   MCV 79.9* 11/05/2015   PLT 362 11/05/2015    Recent Labs Lab 11/05/15 0319  HGB 7.1*   Lab Results  Component Value Date   NA 139 11/05/2015   K 3.8 11/05/2015   CL 106 11/05/2015   CO2 27 11/05/2015   BUN 22* 11/05/2015   CREATININE 1.10 11/05/2015   Lab Results  Component Value Date   ALT 13* 07/15/2015  AST 23 07/15/2015   ALKPHOS 65 07/15/2015   BILITOT 0.9 07/15/2015   No results for input(s): APTT, INR, PTT in the last 168 hours. Assessment/Plan: Mr. Risner is a 81 y.o. male with known CAD with likely UGI bleeding, exacerbated by brilinta use.   Recommendations: Agree with blood transfusion. Agree with PPI use. Agree with holding brilinta. Pt has to be off brilinta for 5 days before it is safe to undergo EGD. Also, need cardiac clearance in light of elevated troponin and chest pain last night.  Thank you for the consult. Please call with questions or concerns.  Turrell Severt, Lupita Dawn, MD

## 2015-11-05 NOTE — Progress Notes (Signed)
Alert and oriented. No complaints of pain other than some intermittent chest tightness 2/10 this morning. Two units of blood transfused. No signs of reaction, patient tolerated well. Plan is to potentially send the patient back home and have tests run outpatient. Patient has had no bloody stools since admission. Repeat hemoglobin will be ordered. Will continue to monitor.

## 2015-11-05 NOTE — Progress Notes (Signed)
Since there is no plan to do an EGD at this time. Consulted Dr. Candace Cruise for diet order. MD stated okay to give patient clear liquids.

## 2015-11-05 NOTE — Progress Notes (Signed)
[place] at [facility]                                                                                                                                                                                            Patient Demographics   Justin Leonard, is a 81 y.o. male, DOB - October 02, 1927, [id number]  Admit date - 11/05/2015   Admitting Physician Harrie Foreman, MD  Outpatient Primary MD for the patient is Olmedo, Guy Begin, MD   LOS - 0  Subjective: pt admitted with sob, cp noted to have worsening anemia, dark color stool      Review of Systems:   CONSTITUTIONAL: No documented fever.  Positive fatigue and weakness. No weight gain, no weight loss.  EYES: No blurry or double vision.  ENT: No tinnitus. No postnasal drip. No redness of the oropharynx.  RESPIRATORY: No cough, no wheeze, no hemoptysis. Positive  dyspnea.  CARDIOVASCULAR: No chest pain. No orthopnea. No palpitations. No syncope.  GASTROINTESTINAL: No nausea, no vomiting or diarrhea. No abdominal pain.  Positive melena or hematochezia.  GENITOURINARY: No dysuria or hematuria.  ENDOCRINE: No polyuria or nocturia. No heat or cold intolerance.  HEMATOLOGY: No anemia. No bruising. No bleeding.  INTEGUMENTARY: No rashes. No lesions.  MUSCULOSKELETAL: No arthritis. No swelling. No gout.  NEUROLOGIC: No numbness, tingling, or ataxia. No seizure-type activity.  PSYCHIATRIC: No anxiety. No insomnia. No ADD.    Vitals:   Filed Vitals:   11/05/15 0958 11/05/15 1033 11/05/15 1143 11/05/15 1309  BP: 135/46 141/49 174/67 162/69  Pulse: 68 71 69 70  Temp: 98.2 F (36.8 C) 98.2 F (36.8 C) 97.8 F (36.6 C) 98.1 F (36.7 C)  TempSrc: Oral Oral Oral Oral  Resp: 18 20 19 20   Height:      Weight:      SpO2: 91% 99% 100% 100%    Wt Readings from Last 3 Encounters:  11/05/15 102.105 kg (225 lb 1.6 oz)  09/30/15 110.678 kg (244 lb)  07/29/15 107.049 kg (236 lb)     Intake/Output  Summary (Last 24 hours) at 11/05/15 1422 Last data filed at 11/05/15 1309  Gross per 24 hour  Intake    418 ml  Output    500 ml  Net    -82 ml    Physical Exam:   GENERAL: Pleasant-appearing in no apparent distress.  HEAD, EYES, EARS, NOSE AND THROAT: Atraumatic, normocephalic. Extraocular muscles are intact. Pupils equal and reactive to light. Sclerae anicteric. No conjunctival injection. No oro-pharyngeal erythema.  NECK: Supple. There is no jugular venous distention. No bruits, no lymphadenopathy,  no thyromegaly.  HEART: Regular rate and rhythm,. No murmurs, no rubs, no clicks.  LUNGS: Clear to auscultation bilaterally. No rales or rhonchi. No wheezes.  ABDOMEN: Soft, flat, nontender, nondistended. Has good bowel sounds. No hepatosplenomegaly appreciated.  EXTREMITIES: No evidence of any cyanosis, clubbing, or peripheral edema.  +2 pedal and radial pulses bilaterally.  NEUROLOGIC: The patient is alert, awake, and oriented x3 with no focal motor or sensory deficits appreciated bilaterally.  SKIN: Moist and warm with no rashes appreciated.  Psych: Not anxious, depressed LN: No inguinal LN enlargement    Antibiotics   Anti-infectives    None      Medications   Scheduled Meds: . docusate sodium  100 mg Oral BID  . FLUoxetine  20 mg Oral Daily  . furosemide  40 mg Intravenous Q12H  . insulin aspart  0-9 Units Subcutaneous TID WC  . lisinopril  40 mg Oral Daily  . metoprolol  50 mg Oral BID  . pantoprazole (PROTONIX) IV  40 mg Intravenous Q12H  . pravastatin  20 mg Oral q1800  . sodium chloride  3 mL Intravenous Q12H  . tiotropium  1 capsule Inhalation Daily   Continuous Infusions:  PRN Meds:.acetaminophen **OR** acetaminophen, albuterol, hydrALAZINE, morphine injection, nitroGLYCERIN, ondansetron **OR** ondansetron (ZOFRAN) IV   Data Review:   Micro Results No results found for this or any previous visit (from the past 240 hour(s)).  Radiology Reports Dg Chest 2  View  11/05/2015  CLINICAL DATA:  Acute onset of mid chest pain.  Initial encounter. EXAM: CHEST  2 VIEW COMPARISON:  Chest radiograph performed 07/15/2015 FINDINGS: The lungs are well-aerated. Vascular congestion is noted. Increased interstitial markings raise concern for mild pulmonary edema, though pneumonia could have a similar appearance. There is no evidence of pleural effusion or pneumothorax. The heart is normal in size. The patient is status post median sternotomy, with evidence of prior CABG. No acute osseous abnormalities are seen. IMPRESSION: Vascular congestion noted. Increased interstitial markings raise concern for mild pulmonary edema, though pneumonia could have a similar appearance. Electronically Signed   By: Garald Balding M.D.   On: 11/05/2015 03:55     CBC  Recent Labs Lab 11/05/15 0319  WBC 9.4  HGB 7.1*  HCT 22.8*  PLT 362  MCV 79.9*  MCH 24.8*  MCHC 31.0*  RDW 15.3*    Chemistries   Recent Labs Lab 11/05/15 0319  NA 139  K 3.8  CL 106  CO2 27  GLUCOSE 180*  BUN 22*  CREATININE 1.10  CALCIUM 9.8   ------------------------------------------------------------------------------------------------------------------ estimated creatinine clearance is 62.4 mL/min (by C-G formula based on Cr of 1.1). ------------------------------------------------------------------------------------------------------------------ No results for input(s): HGBA1C in the last 72 hours. ------------------------------------------------------------------------------------------------------------------ No results for input(s): CHOL, HDL, LDLCALC, TRIG, CHOLHDL, LDLDIRECT in the last 72 hours. ------------------------------------------------------------------------------------------------------------------  Recent Labs  11/05/15 0319  TSH 3.455   ------------------------------------------------------------------------------------------------------------------  Recent Labs   11/05/15 0929  FOLATE 12.0  FERRITIN 12*  TIBC 412  IRON 23*    Coagulation profile No results for input(s): INR, PROTIME in the last 168 hours.  No results for input(s): DDIMER in the last 72 hours.  Cardiac Enzymes  Recent Labs Lab 11/05/15 0319 11/05/15 0929  TROPONINI <0.03 0.03   ------------------------------------------------------------------------------------------------------------------ Invalid input(s): POCBNP    Assessment & Plan    This is an 81 year old Caucasian male with past medical history significant for coronary artery disease status post CABG and PCI now has a GI bleed and symptomatic anemia. 1.  GI bleed: due to melena likely upper protonix bid, seen by gi they would like cardiac clearance  And hold brilinta 2. Chest pain: Likely anginal secondary to anemia. Troponin is negative.  Cards consult 3. Acute on chronic systolic chf- iv lasix bid 4. Essential hypertension: Continue lisinopril and metoprolol 5. Diabetes mellitus type 2: resume home meds 6. COPD: Stable. Continue inhalers per home regimen 7. Lipidemia: Continue statin therapy 8. DVT prophylaxis: SCDs 9. GI prophylaxis: IV Protonix due to GI bleed     Code Status Orders        Start     Ordered   11/05/15 0638  Full code   Continuous     11/05/15 0638           Consults gi, cards  DVT Prophylaxis   SCDs    Lab Results  Component Value Date   PLT 362 11/05/2015     Time Spent in minutes   62min  Dustin Flock M.D on 11/05/2015 at 2:22 PM  Between 7am to 6pm - Pager - [phone]  After 6pm go to [email or web address] - password [email or web address] [facility]   Office  [phone]

## 2015-11-05 NOTE — Progress Notes (Signed)
Notified Dr. Posey Pronto that patient's blood pressure is up. MD acknowledged, no new orders. Will give IV lasix at this time.

## 2015-11-06 ENCOUNTER — Inpatient Hospital Stay: Payer: Medicare Other

## 2015-11-06 ENCOUNTER — Encounter: Payer: Self-pay | Admitting: Radiology

## 2015-11-06 DIAGNOSIS — K922 Gastrointestinal hemorrhage, unspecified: Secondary | ICD-10-CM

## 2015-11-06 LAB — TYPE AND SCREEN
ABO/RH(D): O POS
ANTIBODY SCREEN: NEGATIVE
Unit division: 0
Unit division: 0

## 2015-11-06 LAB — GLUCOSE, CAPILLARY
Glucose-Capillary: 124 mg/dL — ABNORMAL HIGH (ref 65–99)
Glucose-Capillary: 136 mg/dL — ABNORMAL HIGH (ref 65–99)
Glucose-Capillary: 230 mg/dL — ABNORMAL HIGH (ref 65–99)
Glucose-Capillary: 94 mg/dL (ref 65–99)

## 2015-11-06 LAB — BASIC METABOLIC PANEL
ANION GAP: 7 (ref 5–15)
BUN: 20 mg/dL (ref 6–20)
CALCIUM: 10 mg/dL (ref 8.9–10.3)
CO2: 29 mmol/L (ref 22–32)
CREATININE: 1.05 mg/dL (ref 0.61–1.24)
Chloride: 102 mmol/L (ref 101–111)
GFR calc Af Amer: >60 mL/min (ref >60)
GLUCOSE: 254 mg/dL — AB (ref 65–99)
Potassium: 3.7 mmol/L (ref 3.5–5.1)
Sodium: 138 mmol/L (ref 135–145)

## 2015-11-06 LAB — URINALYSIS COMPLETE WITH MICROSCOPIC (ARMC ONLY)
BILIRUBIN URINE: NEGATIVE
Glucose, UA: NEGATIVE mg/dL
KETONES UR: NEGATIVE mg/dL
LEUKOCYTES UA: NEGATIVE
NITRITE: NEGATIVE
PH: 5 (ref 5.0–8.0)
SPECIFIC GRAVITY, URINE: 1.019 (ref 1.005–1.030)

## 2015-11-06 LAB — TROPONIN I
TROPONIN I: 0.06 ng/mL — AB (ref <0.031)
Troponin I: 0.08 ng/mL — ABNORMAL HIGH (ref <0.031)
Troponin I: 0.05 ng/mL — ABNORMAL HIGH (ref <0.031)

## 2015-11-06 LAB — CBC
HCT: 28.3 % — ABNORMAL LOW (ref 40.0–52.0)
Hemoglobin: 8.9 g/dL — ABNORMAL LOW (ref 13.0–18.0)
MCH: 25.3 pg — ABNORMAL LOW (ref 26.0–34.0)
MCHC: 31.4 g/dL — AB (ref 32.0–36.0)
MCV: 80.4 fL (ref 80.0–100.0)
PLATELETS: 340 10*3/uL (ref 150–440)
RBC: 3.52 MIL/uL — ABNORMAL LOW (ref 4.40–5.90)
RDW: 15.9 % — AB (ref 11.5–14.5)
WBC: 21.3 10*3/uL — AB (ref 3.8–10.6)

## 2015-11-06 LAB — HEPATIC FUNCTION PANEL
ALT: 10 U/L — ABNORMAL LOW (ref 17–63)
AST: 11 U/L — ABNORMAL LOW (ref 15–41)
Albumin: 3.4 g/dL — ABNORMAL LOW (ref 3.5–5.0)
Alkaline Phosphatase: 51 U/L (ref 38–126)
BILIRUBIN DIRECT: 0.3 mg/dL (ref 0.1–0.5)
BILIRUBIN INDIRECT: 0.9 mg/dL (ref 0.3–0.9)
BILIRUBIN TOTAL: 1.2 mg/dL (ref 0.3–1.2)
Total Protein: 7.1 g/dL (ref 6.5–8.1)

## 2015-11-06 LAB — LIPASE, BLOOD: Lipase: 17 U/L (ref 11–51)

## 2015-11-06 MED ORDER — DIGOXIN 0.25 MG/ML IJ SOLN
0.5000 mg | Freq: Once | INTRAMUSCULAR | Status: AC
  Administered 2015-11-07: 0.5 mg via INTRAVENOUS
  Filled 2015-11-06 (×2): qty 2

## 2015-11-06 MED ORDER — INSULIN ASPART 100 UNIT/ML ~~LOC~~ SOLN: 0.0000 [IU] | Freq: Three times a day (TID) | SUBCUTANEOUS | Status: DC

## 2015-11-06 MED ORDER — SODIUM CHLORIDE 0.9 % IV SOLN
3.0000 g | Freq: Four times a day (QID) | INTRAVENOUS | Status: DC
  Administered 2015-11-06 – 2015-11-08 (×8): 3 g via INTRAVENOUS
  Filled 2015-11-06 (×13): qty 3

## 2015-11-06 MED ORDER — IOHEXOL 300 MG/ML  SOLN
100.0000 mL | Freq: Once | INTRAMUSCULAR | Status: AC | PRN
  Administered 2015-11-06: 100 mL via INTRAVENOUS

## 2015-11-06 MED ORDER — SODIUM CHLORIDE 0.9 % IV SOLN
INTRAVENOUS | Status: DC
  Administered 2015-11-06 – 2015-11-11 (×9): via INTRAVENOUS

## 2015-11-06 MED ORDER — INSULIN ASPART 100 UNIT/ML ~~LOC~~ SOLN
0.0000 [IU] | Freq: Three times a day (TID) | SUBCUTANEOUS | Status: DC
  Administered 2015-11-06 – 2015-11-07 (×2): 2 [IU] via SUBCUTANEOUS
  Administered 2015-11-07: 1 [IU] via SUBCUTANEOUS
  Administered 2015-11-07 – 2015-11-09 (×7): 2 [IU] via SUBCUTANEOUS
  Filled 2015-11-06: qty 2
  Filled 2015-11-06: qty 7
  Filled 2015-11-06 (×7): qty 2

## 2015-11-06 MED ORDER — DIGOXIN 0.25 MG/ML IJ SOLN: 0.2500 mg | Freq: Every day | INTRAMUSCULAR | Status: DC

## 2015-11-06 NOTE — Clinical Documentation Improvement (Signed)
Internal Medicine  Can the diagnosis of Anemia be further specified?   Acute Blood Loss Anemia, including the suspected or known cause or associated condition(s)  Acute on chronic blood loss anemia, including the suspected or known cause or associated condition(s)  Chronic blood loss anemia, including the suspected or known cause or associated condition(s)  Precipitous drop in Hematocrit, including the suspected or known cause or associated condition(s)  Other  Clinically Undetermined  Document any associated diagnoses/conditions. Please update your documentation within the medical record to reflect your response to this query. Thank you.  Supporting Information: "GI bleed: due to melena likely upper protonix bid, seen by gi they would like cardiac clearance And hold brilinta "  Component     Latest Ref Rng 11/05/2015 11/05/2015 11/06/2015         3:19 AM  7:29 PM   RBC     4.40 - 5.90 MIL/uL 2.85 (L)  3.52 (L)  Hemoglobin     13.0 - 18.0 g/dL 7.1 (L) 8.8 (L) 8.9 (L)    Please exercise your independent, professional judgment when responding. A specific answer is not anticipated or expected.  Thank You, Alessandra Grout, RN, BSN, CCDS,Clinical Documentation Specialist:  [phone]  [phone]=[facility]- Health Information Management

## 2015-11-06 NOTE — Consult Note (Signed)
  GI Inpatient Follow-up Note  Patient Identification: Justin Leonard is a 81 y.o. male  Subjective: Vomiting last night of "dark" material, according to son. Hgb stable but low. CT show normal stomach but GB abnormal. LFT normal. Blood cx sent and pending. Pt on Abx.  WBC high.  Scheduled Inpatient Medications:  . ampicillin-sulbactam (UNASYN) IV  3 g Intravenous Q6H  . docusate sodium  100 mg Oral BID  . FLUoxetine  20 mg Oral Daily  . insulin aspart  0-9 Units Subcutaneous TID WC  . lisinopril  40 mg Oral Daily  . metoprolol  50 mg Oral BID  . pantoprazole (PROTONIX) IV  40 mg Intravenous Q12H  . pravastatin  20 mg Oral q1800  . sodium chloride  3 mL Intravenous Q12H  . tiotropium  1 capsule Inhalation Daily    Continuous Inpatient Infusions:   . sodium chloride 50 mL/hr at 11/06/15 0535    PRN Inpatient Medications:  acetaminophen **OR** acetaminophen, albuterol, hydrALAZINE, nitroGLYCERIN, ondansetron **OR** ondansetron (ZOFRAN) IV, promethazine  Review of Systems: Constitutional: Weight is stable.  Eyes: No changes in vision. ENT: No oral lesions, sore throat.  GI: see HPI.  Heme/Lymph: No easy bruising.  CV: No chest pain.  GU: No hematuria.  Integumentary: No rashes.  Neuro: No headaches.  Psych: No depression/anxiety.  Endocrine: No heat/cold intolerance.  Allergic/Immunologic: No urticaria.  Resp: No cough, SOB.  Musculoskeletal: No joint swelling.    Physical Examination: BP 124/46 mmHg  Pulse 81  Temp(Src) 97.8 F (36.6 C) (Oral)  Resp 18  Ht 6\' 3"  (1.905 m)  Wt 102.105 kg (225 lb 1.6 oz)  BMI 28.14 kg/m2  SpO2 90% Gen: NAD, alert and oriented x 4 HEENT: PEERLA, EOMI, Neck: supple, no JVD or thyromegaly Chest: CTA bilaterally, no wheezes, crackles, or other adventitious sounds CV: RRR, no m/g/c/r Abd: soft, NT, ND, +BS in all four quadrants; no HSM, guarding, ridigity, or rebound tenderness Ext: no edema, well perfused with 2+ pulses, Skin:  no rash or lesions noted Lymph: no LAD  Data: Lab Results  Component Value Date   WBC 21.3* 11/06/2015   HGB 8.9* 11/06/2015   HCT 28.3* 11/06/2015   MCV 80.4 11/06/2015   PLT 340 11/06/2015    Recent Labs Lab 11/05/15 0319 11/05/15 1929 11/06/15 0409  HGB 7.1* 8.8* 8.9*   Lab Results  Component Value Date   NA 138 11/06/2015   K 3.7 11/06/2015   CL 102 11/06/2015   CO2 29 11/06/2015   BUN 20 11/06/2015   CREATININE 1.05 11/06/2015   Lab Results  Component Value Date   ALT 10* 11/06/2015   AST 11* 11/06/2015   ALKPHOS 51 11/06/2015   BILITOT 1.2 11/06/2015   No results for input(s): APTT, INR, PTT in the last 168 hours. Assessment/Plan: Justin Leonard is a 81 y.o. male with UGI bleeding on brilinta.  Recommendations: Continue Abx. Await cardiology input to see how safe to proceed with EGD but also see how long he can be off brilinta with his cardiac stent. Would have surgery evaluate pt regarding his GB. Thanks.  Please call with questions or concerns.  Richelle Glick, Lupita Dawn, MD

## 2015-11-06 NOTE — Progress Notes (Signed)
Patient has been oriented, but drowsy today. Did receive IV phenergan overnight. Has not complained of anything and no more episodes of emesis so far. PO intake has been poor, but patient has been able to take some sips of liquids. Fluids still going at 9mL/hr. Family has been at bedside, questions answered. Awaiting abdominal CT results. Blood pressure is much lower than it was yesterday, did not give PO BP meds this AM due to patient being sleepy. Patient also afraid to take pills thinking he may throw up. MD is aware of patients episodes of emesis overnight. Will continue to monitor.

## 2015-11-06 NOTE — Progress Notes (Signed)
Pt HR sustaining in 150s-160s, possible atrial fib/flutter. MD Dr. Posey Pronto notified. Orders given for stat EKG to be sent to Dr. Posey Pronto for further orders. RN will continue to monitor. Rachael Fee, RN

## 2015-11-06 NOTE — Consult Note (Signed)
Surgical Consultation  11/06/2015  Justin Leonard is an 81 y.o. male.   CC: Nausea and vomiting  HPI: Patient is difficult historian but he is accompanied by multiple family members. He states that he has not had any abdominal pain and that is confirmed by his son. He has had some emesis and is being worked up for GI bleed but an abnormal CT scan scan suggested gas bubbles in the gallbladder. He does have an elevated white blood cell count as well. He denies fevers or chills and again denies any abdominal pain  Past Medical History  Diagnosis Date  . CHF (congestive heart failure) ([place])   . Hypertension   . Diabetes mellitus without complication ([place])   . Coronary artery disease   . Hyperlipidemia   . Deafness   . Peripheral neuropathy ([place])   . Back pain   . Umbilical hernia   . Depression   . Melena   . COPD (chronic obstructive pulmonary disease) ([place])   . Skin cancer     Past Surgical History  Procedure Laterality Date  . Coronary artery bypass graft    . Cardiac catheterization Left 07/17/2015    Procedure: Right/Left Heart Cath and Coronary Angiography;  Surgeon: [name], MD;  Location: [facility];  Service: Cardiovascular;  Laterality: Left;  . Cardiac catheterization N/A 07/17/2015    Procedure: Coronary Stent Intervention;  Surgeon: [name], MD;  Location: [facility];  Service: Cardiovascular;  Laterality: N/A;  . Coronary angioplasty      Family History  Problem Relation Age of Onset  . Anemia Neg Hx   . Arrhythmia Neg Hx   . Asthma Neg Hx   . Clotting disorder Neg Hx   . Fainting Neg Hx   . Heart attack Neg Hx   . Heart disease Neg Hx   . Heart failure Neg Hx   . Hyperlipidemia Neg Hx   . Hypertension Father   . CVA Father     Social History:  reports that he quit smoking about 37 years ago. His smoking use included Cigarettes. He has a 90 pack-year smoking history. He has never used smokeless tobacco. He reports that  he does not drink alcohol or use illicit drugs.  Allergies: No Known Allergies  Medications reviewed.   Review of Systems:   Review of Systems  Constitutional: Negative for fever and chills.  HENT: Negative.   Respiratory: Negative.   Cardiovascular: Negative for chest pain and palpitations.  Gastrointestinal: Positive for vomiting. Negative for heartburn, nausea, abdominal pain, diarrhea, constipation and blood in stool.       Hematemesis  Genitourinary: Negative.   Musculoskeletal: Negative.   Skin: Negative.   Neurological: Negative.   Psychiatric/Behavioral: Negative.      Physical Exam:  BP 124/46 mmHg  Pulse 81  Temp(Src) 97.8 F (36.6 C) (Oral)  Resp 18  Ht _0  (1.905 m)  Wt 225 lb 1.6 oz (102.105 kg)  BMI 28.14 kg/m2  SpO2 90%  Physical Exam  Constitutional: He is well-developed, well-nourished, and in no distress. No distress.  Appears quite comfortable but somnolent wakes to discuss his care  HENT:  Head: Normocephalic and atraumatic.  Eyes: Pupils are equal, round, and reactive to light. Right eye exhibits no discharge. Left eye exhibits no discharge. No scleral icterus.  Neck: Normal range of motion. Neck supple.  Cardiovascular: Normal rate.   Pulmonary/Chest: Effort normal. No respiratory distress.  Abdominal: He exhibits no  distension. There is tenderness. There is no rebound and no guarding.  Minimal right upper quadrant tenderness without Murphy's sign  Neurological: He is alert.  Skin: Skin is warm and dry. He is not diaphoretic.  Vitals reviewed.     Results for orders placed or performed during the hospital encounter of 11/05/15 (from the past 48 hour(s))  Basic metabolic panel     Status: Abnormal   Collection Time: 11/05/15  3:19 AM  Result Value Ref Range   Sodium 139 135 - 145 mmol/L   Potassium 3.8 3.5 - 5.1 mmol/L   Chloride 106 101 - 111 mmol/L   CO2 27 22 - 32 mmol/L   Glucose, Bld 180 (H) 65 - 99 mg/dL   BUN 22 (H) 6 - 20  mg/dL   Creatinine, Ser 1.10 0.61 - 1.24 mg/dL   Calcium 9.8 8.9 - 10.3 mg/dL   GFR calc non Af Amer 59 (L) >60 mL/min   GFR calc Af Amer >60 >60 mL/min    Comment: (NOTE) The eGFR has been calculated using the CKD EPI equation. This calculation has not been validated in all clinical situations. eGFR's persistently <60 mL/min signify possible Chronic Kidney Disease.    Anion gap 6 5 - 15  CBC     Status: Abnormal   Collection Time: 11/05/15  3:19 AM  Result Value Ref Range   WBC 9.4 3.8 - 10.6 K/uL   RBC 2.85 (L) 4.40 - 5.90 MIL/uL   Hemoglobin 7.1 (L) 13.0 - 18.0 g/dL   HCT 22.8 (L) 40.0 - 52.0 %   MCV 79.9 (L) 80.0 - 100.0 fL   MCH 24.8 (L) 26.0 - 34.0 pg   MCHC 31.0 (L) 32.0 - 36.0 g/dL   RDW 15.3 (H) 11.5 - 14.5 %   Platelets 362 150 - 440 K/uL  Troponin I     Status: None   Collection Time: 11/05/15  3:19 AM  Result Value Ref Range   Troponin I <0.03 <0.031 ng/mL    Comment:        NO INDICATION OF MYOCARDIAL INJURY.   TSH     Status: None   Collection Time: 11/05/15  3:19 AM  Result Value Ref Range   TSH 3.455 0.350 - 4.500 uIU/mL  Hemoglobin A1c     Status: None   Collection Time: 11/05/15  3:19 AM  Result Value Ref Range   Hgb A1c MFr Bld 5.4 4.0 - 6.0 %  Type and screen [place]     Status: None   Collection Time: 11/05/15  4:16 AM  Result Value Ref Range   ABO/RH(D) O POS    Antibody Screen NEG    Sample Expiration 11/08/2015    Unit Number [id number]    Blood Component Type RED CELLS,LR    Unit division 00    Status of Unit ISSUED,FINAL    Transfusion Status OK TO TRANSFUSE    Crossmatch Result Compatible    Unit Number [id number]    Blood Component Type RED CELLS,LR    Unit division 00    Status of Unit ISSUED,FINAL    Transfusion Status OK TO TRANSFUSE    Crossmatch Result Compatible   ABO/Rh     Status: None   Collection Time: 11/05/15  4:16 AM  Result Value Ref Range   ABO/RH(D) O POS   Glucose, capillary      Status: Abnormal   Collection Time: 11/05/15  7:43 AM  Result Value Ref Range  Glucose-Capillary 194 (H) 65 - 99 mg/dL  Prepare RBC     Status: None   Collection Time: 11/05/15  8:29 AM  Result Value Ref Range   Order Confirmation ORDER PROCESSED BY BLOOD BANK   Troponin I     Status: None   Collection Time: 11/05/15  9:29 AM  Result Value Ref Range   Troponin I 0.03 <0.031 ng/mL    Comment:        NO INDICATION OF MYOCARDIAL INJURY.   Iron and TIBC     Status: Abnormal   Collection Time: 11/05/15  9:29 AM  Result Value Ref Range   Iron 23 (L) 45 - 182 ug/dL   TIBC 412 250 - 450 ug/dL   Saturation Ratios 6 (L) 17.9 - 39.5 %   UIBC 389 ug/dL  Ferritin     Status: Abnormal   Collection Time: 11/05/15  9:29 AM  Result Value Ref Range   Ferritin 12 (L) 24 - 336 ng/mL  Vitamin B12     Status: Abnormal   Collection Time: 11/05/15  9:29 AM  Result Value Ref Range   Vitamin B-12 132 (L) 180 - 914 pg/mL    Comment: (NOTE) This assay is not validated for testing neonatal or myeloproliferative syndrome specimens for Vitamin B12 levels. Performed at [facility]   Folate     Status: None   Collection Time: 11/05/15  9:29 AM  Result Value Ref Range   Folate 12.0 >5.9 ng/mL  Glucose, capillary     Status: Abnormal   Collection Time: 11/05/15 12:32 PM  Result Value Ref Range   Glucose-Capillary 159 (H) 65 - 99 mg/dL  Troponin I     Status: Abnormal   Collection Time: 11/05/15  7:29 PM  Result Value Ref Range   Troponin I 0.06 (H) <0.031 ng/mL    Comment: READ BACK AND VERIFIED WITH  YASMIN SORIANO AT 2045 11/05/15 SDR        PERSISTENTLY INCREASED TROPONIN VALUES IN THE RANGE OF 0.04-0.49 ng/mL CAN BE SEEN IN:       -UNSTABLE ANGINA       -CONGESTIVE HEART FAILURE       -MYOCARDITIS       -CHEST TRAUMA       -ARRYHTHMIAS       -LATE PRESENTING MYOCARDIAL INFARCTION       -COPD   CLINICAL FOLLOW-UP RECOMMENDED.   Hemoglobin     Status: Abnormal   Collection  Time: 11/05/15  7:29 PM  Result Value Ref Range   Hemoglobin 8.8 (L) 13.0 - 18.0 g/dL  Glucose, capillary     Status: Abnormal   Collection Time: 11/05/15  9:57 PM  Result Value Ref Range   Glucose-Capillary 134 (H) 65 - 99 mg/dL  CBC     Status: Abnormal   Collection Time: 11/06/15  4:09 AM  Result Value Ref Range   WBC 21.3 (H) 3.8 - 10.6 K/uL   RBC 3.52 (L) 4.40 - 5.90 MIL/uL   Hemoglobin 8.9 (L) 13.0 - 18.0 g/dL   HCT 28.3 (L) 40.0 - 52.0 %   MCV 80.4 80.0 - 100.0 fL   MCH 25.3 (L) 26.0 - 34.0 pg   MCHC 31.4 (L) 32.0 - 36.0 g/dL   RDW 15.9 (H) 11.5 - 14.5 %   Platelets 340 150 - 440 K/uL  Basic metabolic panel     Status: Abnormal   Collection Time: 11/06/15  4:09 AM  Result Value Ref Range  Sodium 138 135 - 145 mmol/L   Potassium 3.7 3.5 - 5.1 mmol/L   Chloride 102 101 - 111 mmol/L   CO2 29 22 - 32 mmol/L   Glucose, Bld 254 (H) 65 - 99 mg/dL   BUN 20 6 - 20 mg/dL   Creatinine, Ser 1.05 0.61 - 1.24 mg/dL   Calcium 10.0 8.9 - 10.3 mg/dL   GFR calc non Af Amer >60 >60 mL/min   GFR calc Af Amer >60 >60 mL/min    Comment: (NOTE) The eGFR has been calculated using the CKD EPI equation. This calculation has not been validated in all clinical situations. eGFR's persistently <60 mL/min signify possible Chronic Kidney Disease.    Anion gap 7 5 - 15  Troponin I     Status: Abnormal   Collection Time: 11/06/15  4:09 AM  Result Value Ref Range   Troponin I 0.08 (H) <0.031 ng/mL    Comment: PREVIOUS RESULT CALLED TO Corine Shelter AT 2045 ON 11/05/15 BY SDR.Marland KitchenMarland KitchenAllgood        PERSISTENTLY INCREASED TROPONIN VALUES IN THE RANGE OF 0.04-0.49 ng/mL CAN BE SEEN IN:       -UNSTABLE ANGINA       -CONGESTIVE HEART FAILURE       -MYOCARDITIS       -CHEST TRAUMA       -ARRYHTHMIAS       -LATE PRESENTING MYOCARDIAL INFARCTION       -COPD   CLINICAL FOLLOW-UP RECOMMENDED.   Glucose, capillary     Status: Abnormal   Collection Time: 11/06/15  7:46 AM  Result Value Ref Range    Glucose-Capillary 230 (H) 65 - 99 mg/dL   Comment 1 Notify RN   Troponin I     Status: Abnormal   Collection Time: 11/06/15 10:10 AM  Result Value Ref Range   Troponin I 0.05 (H) <0.031 ng/mL    Comment: PREVIOUS RESULT CALLED 11/05/15 AT 2045 BY SDR/DAS        PERSISTENTLY INCREASED TROPONIN VALUES IN THE RANGE OF 0.04-0.49 ng/mL CAN BE SEEN IN:       -UNSTABLE ANGINA       -CONGESTIVE HEART FAILURE       -MYOCARDITIS       -CHEST TRAUMA       -ARRYHTHMIAS       -LATE PRESENTING MYOCARDIAL INFARCTION       -COPD   CLINICAL FOLLOW-UP RECOMMENDED.   Urinalysis complete, with microscopic ([facility] only)     Status: Abnormal   Collection Time: 11/06/15 12:00 PM  Result Value Ref Range   Color, Urine AMBER (A) YELLOW   APPearance CLEAR (A) CLEAR   Glucose, UA NEGATIVE NEGATIVE mg/dL   Bilirubin Urine NEGATIVE NEGATIVE   Ketones, ur NEGATIVE NEGATIVE mg/dL   Specific Gravity, Urine 1.019 1.005 - 1.030   Hgb urine dipstick 1+ (A) NEGATIVE   pH 5.0 5.0 - 8.0   Protein, ur >500 (A) NEGATIVE mg/dL   Nitrite NEGATIVE NEGATIVE   Leukocytes, UA NEGATIVE NEGATIVE   RBC / HPF 0-5 0 - 5 RBC/hpf   WBC, UA 0-5 0 - 5 WBC/hpf   Bacteria, UA RARE (A) NONE SEEN   Squamous Epithelial / LPF 0-5 (A) NONE SEEN   Mucous PRESENT    Hyaline Casts, UA PRESENT   Hepatic function panel     Status: Abnormal   Collection Time: 11/06/15 12:35 PM  Result Value Ref Range   Total Protein 7.1 6.5 - 8.1 g/dL  Albumin 3.4 (L) 3.5 - 5.0 g/dL   AST 11 (L) 15 - 41 U/L   ALT 10 (L) 17 - 63 U/L   Alkaline Phosphatase 51 38 - 126 U/L   Total Bilirubin 1.2 0.3 - 1.2 mg/dL   Bilirubin, Direct 0.3 0.1 - 0.5 mg/dL   Indirect Bilirubin 0.9 0.3 - 0.9 mg/dL  Lipase, blood     Status: None   Collection Time: 11/06/15 12:35 PM  Result Value Ref Range   Lipase 17 11 - 51 U/L   Dg Chest 1 View  11/06/2015  CLINICAL DATA:  Shortness of breath and weakness. EXAM: CHEST 1 VIEW COMPARISON:  11/05/2015. FINDINGS: Trachea is  midline. Heart is at the upper limits of normal in size to mildly enlarged. Thoracic aorta is calcified. There is basilar interstitial prominence and indistinctness, new. Small left pleural effusion also new. IMPRESSION: New basilar dependent interstitial prominence/indistinctness and small left pleural effusion, favoring congestive heart failure. Electronically Signed   By: Lorin Picket M.D.   On: 11/06/2015 13:22   Dg Chest 2 View  11/05/2015  CLINICAL DATA:  Acute onset of mid chest pain.  Initial encounter. EXAM: CHEST  2 VIEW COMPARISON:  Chest radiograph performed 07/15/2015 FINDINGS: The lungs are well-aerated. Vascular congestion is noted. Increased interstitial markings raise concern for mild pulmonary edema, though pneumonia could have a similar appearance. There is no evidence of pleural effusion or pneumothorax. The heart is normal in size. The patient is status post median sternotomy, with evidence of prior CABG. No acute osseous abnormalities are seen. IMPRESSION: Vascular congestion noted. Increased interstitial markings raise concern for mild pulmonary edema, though pneumonia could have a similar appearance. Electronically Signed   By: Garald Balding M.D.   On: 11/05/2015 03:55   Ct Abdomen Pelvis W Contrast  11/06/2015  CLINICAL DATA:  Chest pain at rest. Nauseated. Intermittent symptoms for 4 weeks. Nausea and diarrhea over Thanksgiving. Decreased hemoglobin. EXAM: CT ABDOMEN AND PELVIS WITH CONTRAST TECHNIQUE: Multidetector CT imaging of the abdomen and pelvis was performed using the standard protocol following bolus administration of intravenous contrast. CONTRAST:  151m OMNIPAQUE IOHEXOL 300 MG/ML  SOLN COMPARISON:  None FINDINGS: Lower chest: There is left lower lobe atelectasis. Coronary artery calcifications are present. Heart is mildly enlarged. No pericardial effusion. Upper abdomen: New no focal abnormality identified within the liver, spleen, pancreas, or right adrenal gland.  Left adrenal adenoma measured/ 1.4 x 2.0 cm. There is symmetric enhancement of both kidneys. Small left lower pole cyst is identified. No hydronephrosis. The gallbladder is distended and has a thickened wall. There is significant pericholecystic inflammatory change. Small amount of gas is identified in the nondependent gallbladder fundus, suspicious for gas-forming organisms. Gastrointestinal tract: The stomach and small bowel loops are normal in appearance. The appendix is normal in appearance. Numerous colonic diverticula are present. There is large stool burden, particularly within the rectosigmoid colon. Pelvis: Large prostate with prominent impression upon the bladder. Urinary bladder is normal in appearance. No free pelvic fluid. Retroperitoneum: Atherosclerotic calcification of the abdominal aorta and branches. No aneurysm. Abdominal wall: Small fat containing supraumbilical hernia Osseous structures: Significant lumbar spondylosis. No suspicious lytic or blastic lesions are identified. IMPRESSION: 1. Distended, thick-walled gallbladder with significant pericholecystic stranding. Gas within the gallbladder wall is suspicious for infection. Further evaluation with ultrasound is recommended. 2. Coronary artery disease. 3. Small left renal cyst. 4. Colonic diverticulosis.  Moderate stool burden. 5. Enlarged prostate. 6. Small fat containing supraumbilical hernia. Electronically Signed  By: Nolon Nations M.D.   On: 11/06/2015 14:05    Assessment/Plan:  CT reviewed with the inaccuracies associated with this CT scan and the fact this patient has no abdominal pain and only minimal tenderness in the right upper quadrant I will obtain an ultrasound as it is more accurate for the right upper quadrant. He may require a HIDA scan as well. With his other medical problems surgical intervention may be difficult and a cholecystostomy tube may be of value.  Florene Glen, MD, FACS

## 2015-11-06 NOTE — Progress Notes (Signed)
[place] [facility]                                                                                                                                                                                            Patient Demographics   Justin Leonard, is a 81 y.o. male, DOB - [birth year], WQ:[id number]  Admit date - 11/05/2015   Admitting Physician Harrie Foreman, MD  Outpatient Primary MD for the patient is Olmedo, Guy Begin, MD   LOS - 1  Subjective: Patient overnight had emesis. Multiple times. Son is at the bedside and very concerned. States that why EGDs not being done right away. Patient currently little drowsy but arouses and able to answer questions.      Review of Systems:   CONSTITUTIONAL: No documented fever.  Positive fatigue and weakness. No weight gain, no weight loss.  EYES: No blurry or double vision.  ENT: No tinnitus. No postnasal drip. No redness of the oropharynx.  RESPIRATORY: No cough, no wheeze, no hemoptysis. Positive  dyspnea.  CARDIOVASCULAR: No chest pain. No orthopnea. No palpitations. No syncope.  GASTROINTESTINAL: No nausea, no vomiting or diarrhea. No abdominal pain.  Positive melena or hematochezia.  GENITOURINARY: No dysuria or hematuria.  ENDOCRINE: No polyuria or nocturia. No heat or cold intolerance.  HEMATOLOGY: No anemia. No bruising. No bleeding.  INTEGUMENTARY: No rashes. No lesions.  MUSCULOSKELETAL: No arthritis. No swelling. No gout.  NEUROLOGIC: No numbness, tingling, or ataxia. No seizure-type activity.  PSYCHIATRIC: No anxiety. No insomnia. No ADD.    Vitals:   Filed Vitals:   11/06/15 0605 11/06/15 0858 11/06/15 0900 11/06/15 1106  BP: 118/31 111/45 111/45 124/46  Pulse: 78 83 84 81  Temp: 98.2 F (36.8 C)   97.8 F (36.6 C)  TempSrc:    Oral  Resp: 20  28 18   Height:      Weight:      SpO2: 90% 89% 90% 90%    Wt Readings from Last 3 Encounters:  11/05/15 102.105 kg (225 lb 1.6  oz)  09/30/15 110.678 kg (244 lb)  07/29/15 107.049 kg (236 lb)     Intake/Output Summary (Last 24 hours) at 11/06/15 1550 Last data filed at 11/06/15 1509  Gross per 24 hour  Intake 920.83 ml  Output   1470 ml  Net -549.17 ml    Physical Exam:   GENERAL: Pleasant-appearing in no apparent distress.  HEAD, EYES, EARS, NOSE AND THROAT: Atraumatic, normocephalic. Extraocular muscles are intact. Pupils equal and reactive to light. Sclerae anicteric. No conjunctival injection. No oro-pharyngeal erythema.  NECK: Supple. There is  no jugular venous distention. No bruits, no lymphadenopathy, no thyromegaly.  HEART: Regular rate and rhythm,. No murmurs, no rubs, no clicks.  LUNGS: Clear to auscultation bilaterally. No rales or rhonchi. No wheezes.  ABDOMEN: Soft, flat, nontender, nondistended. Has good bowel sounds. No hepatosplenomegaly appreciated.  EXTREMITIES: No evidence of any cyanosis, clubbing, or peripheral edema.  +2 pedal and radial pulses bilaterally.  NEUROLOGIC: The patient is alert, awake, and oriented x3 with no focal motor or sensory deficits appreciated bilaterally.  SKIN: Moist and warm with no rashes appreciated.  Psych: Not anxious, depressed LN: No inguinal LN enlargement    Antibiotics   Anti-infectives    Start     Dose/Rate Route Frequency Ordered Stop   11/06/15 1300  Ampicillin-Sulbactam (UNASYN) 3 g in sodium chloride 0.9 % 100 mL IVPB     3 g 100 mL/hr over 60 Minutes Intravenous Every 6 hours 11/06/15 1203        Medications   Scheduled Meds: . ampicillin-sulbactam (UNASYN) IV  3 g Intravenous Q6H  . docusate sodium  100 mg Oral BID  . FLUoxetine  20 mg Oral Daily  . insulin aspart  0-9 Units Subcutaneous TID WC  . lisinopril  40 mg Oral Daily  . metoprolol  50 mg Oral BID  . pantoprazole (PROTONIX) IV  40 mg Intravenous Q12H  . pravastatin  20 mg Oral q1800  . sodium chloride  3 mL Intravenous Q12H  . tiotropium  1 capsule Inhalation Daily    Continuous Infusions: . sodium chloride 50 mL/hr at 11/06/15 0535   PRN Meds:.acetaminophen **OR** acetaminophen, albuterol, hydrALAZINE, nitroGLYCERIN, ondansetron **OR** ondansetron (ZOFRAN) IV, promethazine   Data Review:   Micro Results No results found for this or any previous visit (from the past 240 hour(s)).  Radiology Reports Dg Chest 1 View  11/06/2015  CLINICAL DATA:  Shortness of breath and weakness. EXAM: CHEST 1 VIEW COMPARISON:  11/05/2015. FINDINGS: Trachea is midline. Heart is at the upper limits of normal in size to mildly enlarged. Thoracic aorta is calcified. There is basilar interstitial prominence and indistinctness, new. Small left pleural effusion also new. IMPRESSION: New basilar dependent interstitial prominence/indistinctness and small left pleural effusion, favoring congestive heart failure. Electronically Signed   By: Lorin Picket M.D.   On: 11/06/2015 13:22   Dg Chest 2 View  11/05/2015  CLINICAL DATA:  Acute onset of mid chest pain.  Initial encounter. EXAM: CHEST  2 VIEW COMPARISON:  Chest radiograph performed 07/15/2015 FINDINGS: The lungs are well-aerated. Vascular congestion is noted. Increased interstitial markings raise concern for mild pulmonary edema, though pneumonia could have a similar appearance. There is no evidence of pleural effusion or pneumothorax. The heart is normal in size. The patient is status post median sternotomy, with evidence of prior CABG. No acute osseous abnormalities are seen. IMPRESSION: Vascular congestion noted. Increased interstitial markings raise concern for mild pulmonary edema, though pneumonia could have a similar appearance. Electronically Signed   By: Garald Balding M.D.   On: 11/05/2015 03:55   Ct Abdomen Pelvis W Contrast  11/06/2015  CLINICAL DATA:  Chest pain at rest. Nauseated. Intermittent symptoms for 4 weeks. Nausea and diarrhea over Thanksgiving. Decreased hemoglobin. EXAM: CT ABDOMEN AND PELVIS WITH CONTRAST  TECHNIQUE: Multidetector CT imaging of the abdomen and pelvis was performed using the standard protocol following bolus administration of intravenous contrast. CONTRAST:  116mL OMNIPAQUE IOHEXOL 300 MG/ML  SOLN COMPARISON:  None FINDINGS: Lower chest: There is left lower lobe atelectasis. Coronary  artery calcifications are present. Heart is mildly enlarged. No pericardial effusion. Upper abdomen: New no focal abnormality identified within the liver, spleen, pancreas, or right adrenal gland. Left adrenal adenoma measured/ 1.4 x 2.0 cm. There is symmetric enhancement of both kidneys. Small left lower pole cyst is identified. No hydronephrosis. The gallbladder is distended and has a thickened wall. There is significant pericholecystic inflammatory change. Small amount of gas is identified in the nondependent gallbladder fundus, suspicious for gas-forming organisms. Gastrointestinal tract: The stomach and small bowel loops are normal in appearance. The appendix is normal in appearance. Numerous colonic diverticula are present. There is large stool burden, particularly within the rectosigmoid colon. Pelvis: Large prostate with prominent impression upon the bladder. Urinary bladder is normal in appearance. No free pelvic fluid. Retroperitoneum: Atherosclerotic calcification of the abdominal aorta and branches. No aneurysm. Abdominal wall: Small fat containing supraumbilical hernia Osseous structures: Significant lumbar spondylosis. No suspicious lytic or blastic lesions are identified. IMPRESSION: 1. Distended, thick-walled gallbladder with significant pericholecystic stranding. Gas within the gallbladder wall is suspicious for infection. Further evaluation with ultrasound is recommended. 2. Coronary artery disease. 3. Small left renal cyst. 4. Colonic diverticulosis.  Moderate stool burden. 5. Enlarged prostate. 6. Small fat containing supraumbilical hernia. Electronically Signed   By: Nolon Nations M.D.   On:  11/06/2015 14:05     CBC  Recent Labs Lab 11/05/15 0319 11/05/15 1929 11/06/15 0409  WBC 9.4  --  21.3*  HGB 7.1* 8.8* 8.9*  HCT 22.8*  --  28.3*  PLT 362  --  340  MCV 79.9*  --  80.4  MCH 24.8*  --  25.3*  MCHC 31.0*  --  31.4*  RDW 15.3*  --  15.9*    Chemistries   Recent Labs Lab 11/05/15 0319 11/06/15 0409 11/06/15 1235  NA 139 138  --   K 3.8 3.7  --   CL 106 102  --   CO2 27 29  --   GLUCOSE 180* 254*  --   BUN 22* 20  --   CREATININE 1.10 1.05  --   CALCIUM 9.8 10.0  --   AST  --   --  11*  ALT  --   --  10*  ALKPHOS  --   --  51  BILITOT  --   --  1.2   ------------------------------------------------------------------------------------------------------------------ estimated creatinine clearance is 65.4 mL/min (by C-G formula based on Cr of 1.05). ------------------------------------------------------------------------------------------------------------------  Recent Labs  11/05/15 0319  HGBA1C 5.4   ------------------------------------------------------------------------------------------------------------------ No results for input(s): CHOL, HDL, LDLCALC, TRIG, CHOLHDL, LDLDIRECT in the last 72 hours. ------------------------------------------------------------------------------------------------------------------  Recent Labs  11/05/15 0319  TSH 3.455   ------------------------------------------------------------------------------------------------------------------  Recent Labs  11/05/15 0929  VITAMINB12 132*  FOLATE 12.0  FERRITIN 12*  TIBC 412  IRON 23*    Coagulation profile No results for input(s): INR, PROTIME in the last 168 hours.  No results for input(s): DDIMER in the last 72 hours.  Cardiac Enzymes  Recent Labs Lab 11/05/15 1929 11/06/15 0409 11/06/15 1010  TROPONINI 0.06* 0.08* 0.05*   ------------------------------------------------------------------------------------------------------------------ Invalid  input(s): POCBNP    Assessment & Plan    This is an 81 year old Caucasian male with past medical history significant for coronary artery disease status post CABG and PCI now has a GI bleed and symptomatic anemia. 1. GI bleed: due to melena likely upper protonix bid, seen by gi they would like cardiac clearance  And hold brilinta EGD timing per GI Plan to  the son that I have no control over and the patient would have EGD  2. Nausea vomiting: CT scan of the abdomen I had ordered earlier shows gallbladder distention, his WBC count is elevated I have placed in for a HIDA scan for tomorrow morning and started him on Unasyn,  3. Chest pain: Likely anginal secondary to anemia. Troponin is negative.  Cards consult 4. Acute on chronic systolic chf- iv lasix stopped due to patient son feeling that his father is getting dehydrated and hernia on IV fluids 5. Essential hypertension: Continue lisinopril and metoprolol 6. Diabetes mellitus type 2: resume home meds 7. COPD: Stable. Continue inhalers per home regimen 7. Lipidemia: Continue statin therapy 8. DVT prophylaxis: SCDs 9. GI prophylaxis: IV Protonix due to GI bleed     Code Status Orders        Start     Ordered   11/05/15 0638  Full code   Continuous     11/05/15 0638           Consults gi, cards  DVT Prophylaxis   SCDs    Lab Results  Component Value Date   PLT 340 11/06/2015     Time Spent in minutes   66min  Gustavia Carie M.D on 11/06/2015 at 3:50 PM  Between 7am to 6pm - Pager - [phone]  After 6pm go to [email or web address] - password [email or web address] [facility]   Office  [phone]

## 2015-11-06 NOTE — Progress Notes (Signed)
Patient has become a little more alert. Was able to ambulate to the bathroom with some assistance. Unable to have bowel movement. No complaints of pain. Patient had no nausea and vomiting today until around 1740, one episode of emesis. Yellow in color with mucus, no blood, moderate amount. Given zofran. Patient is now resting comfortably. IV antibiotics started for elevated WBC, pending cultures. Surgery has been by to see patient and ordered an ultrasound to be done. Patient had some apple juice before order was placed, ultrasound stated test would not be accurate. Will plan for tomorrow, patient will be NPO after midnight. Will continue to monitor.

## 2015-11-06 NOTE — Progress Notes (Signed)
MD, Posey Pronto aware that patient has vomited a total of 6 times over night, WBC count this morning 21.3, and that patient's son is concerned that patient has not eaten for the last 24 hours, and that patient has no fluids infusing, made Dr. Posey Pronto aware of patient's history of CHF as well,  Per Dr. Delton See okay to start fluids at this time. Order received for NS to infuse at 59ml/hour. Will continue to monitor.

## 2015-11-06 NOTE — Consult Note (Signed)
Reason for Consult: angina coronary disease GI bleed  preop for EGD Referring Physician:  Dr. Posey Pronto hospitalist Cardiologist  Dr. Derwood Kaplan is an 81 y.o. male.  HPI:  81 year old male with a history of hypertension congestive heart failure coronary disease hyperlipidemia COPD presented with anginal symptoms and profound anemia hemoglobin of around 7. Patient has a history of PCI and stent back in August and is on aspirin and Brilinta. Reasonably patient started having bleeding with dark stools and chest pain symptoms now presents for further evaluation and preoperative assessment for possible EGD. Patient denies any blackout spells of syncope no hemoptysis or hematemesis. States to not use any NSAIDs he has been compliant with his medications  Past Medical History  Diagnosis Date  . CHF (congestive heart failure) ([place])   . Hypertension   . Diabetes mellitus without complication ([place])   . Coronary artery disease   . Hyperlipidemia   . Deafness   . Peripheral neuropathy ([place])   . Back pain   . Umbilical hernia   . Depression   . Melena   . COPD (chronic obstructive pulmonary disease) ([place])   . Skin cancer     Past Surgical History  Procedure Laterality Date  . Coronary artery bypass graft    . Cardiac catheterization Left 07/17/2015    Procedure: Right/Left Heart Cath and Coronary Angiography;  Surgeon: [name], MD;  Location: [facility];  Service: Cardiovascular;  Laterality: Left;  . Cardiac catheterization N/A 07/17/2015    Procedure: Coronary Stent Intervention;  Surgeon: [name], MD;  Location: [facility];  Service: Cardiovascular;  Laterality: N/A;  . Coronary angioplasty      Family History  Problem Relation Age of Onset  . Anemia Neg Hx   . Arrhythmia Neg Hx   . Asthma Neg Hx   . Clotting disorder Neg Hx   . Fainting Neg Hx   . Heart attack Neg Hx   . Heart disease Neg Hx   . Heart failure Neg Hx   . Hyperlipidemia Neg  Hx   . Hypertension Father   . CVA Father     Social History:  reports that he quit smoking about 37 years ago. His smoking use included Cigarettes. He has a 90 pack-year smoking history. He has never used smokeless tobacco. He reports that he does not drink alcohol or use illicit drugs.  Allergies: No Known Allergies  Medications: I have reviewed the patient's current medications.  Results for orders placed or performed during the hospital encounter of 11/05/15 (from the past 48 hour(s))  Basic metabolic panel     Status: Abnormal   Collection Time: 11/05/15  3:19 AM  Result Value Ref Range   Sodium 139 135 - 145 mmol/L   Potassium 3.8 3.5 - 5.1 mmol/L   Chloride 106 101 - 111 mmol/L   CO2 27 22 - 32 mmol/L   Glucose, Bld 180 (H) 65 - 99 mg/dL   BUN 22 (H) 6 - 20 mg/dL   Creatinine, Ser 1.10 0.61 - 1.24 mg/dL   Calcium 9.8 8.9 - 10.3 mg/dL   GFR calc non Af Amer 59 (L) >60 mL/min   GFR calc Af Amer >60 >60 mL/min    Comment: (NOTE) The eGFR has been calculated using the CKD EPI equation. This calculation has not been validated in all clinical situations. eGFR's persistently <60 mL/min signify possible Chronic Kidney Disease.    Anion gap 6 5 -  15  CBC     Status: Abnormal   Collection Time: 11/05/15  3:19 AM  Result Value Ref Range   WBC 9.4 3.8 - 10.6 K/uL   RBC 2.85 (L) 4.40 - 5.90 MIL/uL   Hemoglobin 7.1 (L) 13.0 - 18.0 g/dL   HCT 22.8 (L) 40.0 - 52.0 %   MCV 79.9 (L) 80.0 - 100.0 fL   MCH 24.8 (L) 26.0 - 34.0 pg   MCHC 31.0 (L) 32.0 - 36.0 g/dL   RDW 15.3 (H) 11.5 - 14.5 %   Platelets 362 150 - 440 K/uL  Troponin I     Status: None   Collection Time: 11/05/15  3:19 AM  Result Value Ref Range   Troponin I <0.03 <0.031 ng/mL    Comment:        NO INDICATION OF MYOCARDIAL INJURY.   TSH     Status: None   Collection Time: 11/05/15  3:19 AM  Result Value Ref Range   TSH 3.455 0.350 - 4.500 uIU/mL  Hemoglobin A1c     Status: None   Collection Time: 11/05/15   3:19 AM  Result Value Ref Range   Hgb A1c MFr Bld 5.4 4.0 - 6.0 %  Type and screen [facility]     Status: None   Collection Time: 11/05/15  4:16 AM  Result Value Ref Range   ABO/RH(D) O POS    Antibody Screen NEG    Sample Expiration 11/08/2015    Unit Number [id number]    Blood Component Type RED CELLS,LR    Unit division 00    Status of Unit ISSUED,FINAL    Transfusion Status OK TO TRANSFUSE    Crossmatch Result Compatible    Unit Number [id number]    Blood Component Type RED CELLS,LR    Unit division 00    Status of Unit ISSUED,FINAL    Transfusion Status OK TO TRANSFUSE    Crossmatch Result Compatible   ABO/Rh     Status: None   Collection Time: 11/05/15  4:16 AM  Result Value Ref Range   ABO/RH(D) O POS   Glucose, capillary     Status: Abnormal   Collection Time: 11/05/15  7:43 AM  Result Value Ref Range   Glucose-Capillary 194 (H) 65 - 99 mg/dL  Prepare RBC     Status: None   Collection Time: 11/05/15  8:29 AM  Result Value Ref Range   Order Confirmation ORDER PROCESSED BY BLOOD BANK   Troponin I     Status: None   Collection Time: 11/05/15  9:29 AM  Result Value Ref Range   Troponin I 0.03 <0.031 ng/mL    Comment:        NO INDICATION OF MYOCARDIAL INJURY.   Iron and TIBC     Status: Abnormal   Collection Time: 11/05/15  9:29 AM  Result Value Ref Range   Iron 23 (L) 45 - 182 ug/dL   TIBC 412 250 - 450 ug/dL   Saturation Ratios 6 (L) 17.9 - 39.5 %   UIBC 389 ug/dL  Ferritin     Status: Abnormal   Collection Time: 11/05/15  9:29 AM  Result Value Ref Range   Ferritin 12 (L) 24 - 336 ng/mL  Vitamin B12     Status: Abnormal   Collection Time: 11/05/15  9:29 AM  Result Value Ref Range   Vitamin B-12 132 (L) 180 - 914 pg/mL    Comment: (NOTE) This assay is not validated  for testing neonatal or myeloproliferative syndrome specimens for Vitamin B12 levels. Performed at [facility]   Folate     Status: None   Collection  Time: 11/05/15  9:29 AM  Result Value Ref Range   Folate 12.0 >5.9 ng/mL  Glucose, capillary     Status: Abnormal   Collection Time: 11/05/15 12:32 PM  Result Value Ref Range   Glucose-Capillary 159 (H) 65 - 99 mg/dL  Troponin I     Status: Abnormal   Collection Time: 11/05/15  7:29 PM  Result Value Ref Range   Troponin I 0.06 (H) <0.031 ng/mL    Comment: READ BACK AND VERIFIED WITH  YASMIN SORIANO AT 2045 11/05/15 SDR        PERSISTENTLY INCREASED TROPONIN VALUES IN THE RANGE OF 0.04-0.49 ng/mL CAN BE SEEN IN:       -UNSTABLE ANGINA       -CONGESTIVE HEART FAILURE       -MYOCARDITIS       -CHEST TRAUMA       -ARRYHTHMIAS       -LATE PRESENTING MYOCARDIAL INFARCTION       -COPD   CLINICAL FOLLOW-UP RECOMMENDED.   Hemoglobin     Status: Abnormal   Collection Time: 11/05/15  7:29 PM  Result Value Ref Range   Hemoglobin 8.8 (L) 13.0 - 18.0 g/dL  Glucose, capillary     Status: Abnormal   Collection Time: 11/05/15  9:57 PM  Result Value Ref Range   Glucose-Capillary 134 (H) 65 - 99 mg/dL  CBC     Status: Abnormal   Collection Time: 11/06/15  4:09 AM  Result Value Ref Range   WBC 21.3 (H) 3.8 - 10.6 K/uL   RBC 3.52 (L) 4.40 - 5.90 MIL/uL   Hemoglobin 8.9 (L) 13.0 - 18.0 g/dL   HCT 28.3 (L) 40.0 - 52.0 %   MCV 80.4 80.0 - 100.0 fL   MCH 25.3 (L) 26.0 - 34.0 pg   MCHC 31.4 (L) 32.0 - 36.0 g/dL   RDW 15.9 (H) 11.5 - 14.5 %   Platelets 340 150 - 440 K/uL  Basic metabolic panel     Status: Abnormal   Collection Time: 11/06/15  4:09 AM  Result Value Ref Range   Sodium 138 135 - 145 mmol/L   Potassium 3.7 3.5 - 5.1 mmol/L   Chloride 102 101 - 111 mmol/L   CO2 29 22 - 32 mmol/L   Glucose, Bld 254 (H) 65 - 99 mg/dL   BUN 20 6 - 20 mg/dL   Creatinine, Ser 1.05 0.61 - 1.24 mg/dL   Calcium 10.0 8.9 - 10.3 mg/dL   GFR calc non Af Amer >60 >60 mL/min   GFR calc Af Amer >60 >60 mL/min    Comment: (NOTE) The eGFR has been calculated using the CKD EPI equation. This calculation  has not been validated in all clinical situations. eGFR's persistently <60 mL/min signify possible Chronic Kidney Disease.    Anion gap 7 5 - 15  Troponin I     Status: Abnormal   Collection Time: 11/06/15  4:09 AM  Result Value Ref Range   Troponin I 0.08 (H) <0.031 ng/mL    Comment: PREVIOUS RESULT CALLED TO Corine Shelter AT 2045 ON 11/05/15 BY SDR.Marland KitchenMarland KitchenKinderhook        PERSISTENTLY INCREASED TROPONIN VALUES IN THE RANGE OF 0.04-0.49 ng/mL CAN BE SEEN IN:       -UNSTABLE ANGINA       -CONGESTIVE  HEART FAILURE       -MYOCARDITIS       -CHEST TRAUMA       -ARRYHTHMIAS       -LATE PRESENTING MYOCARDIAL INFARCTION       -COPD   CLINICAL FOLLOW-UP RECOMMENDED.   Glucose, capillary     Status: Abnormal   Collection Time: 11/06/15  7:46 AM  Result Value Ref Range   Glucose-Capillary 230 (H) 65 - 99 mg/dL   Comment 1 Notify RN   Troponin I     Status: Abnormal   Collection Time: 11/06/15 10:10 AM  Result Value Ref Range   Troponin I 0.05 (H) <0.031 ng/mL    Comment: PREVIOUS RESULT CALLED 11/05/15 AT 2045 BY SDR/DAS        PERSISTENTLY INCREASED TROPONIN VALUES IN THE RANGE OF 0.04-0.49 ng/mL CAN BE SEEN IN:       -UNSTABLE ANGINA       -CONGESTIVE HEART FAILURE       -MYOCARDITIS       -CHEST TRAUMA       -ARRYHTHMIAS       -LATE PRESENTING MYOCARDIAL INFARCTION       -COPD   CLINICAL FOLLOW-UP RECOMMENDED.     Dg Chest 2 View  11/05/2015  CLINICAL DATA:  Acute onset of mid chest pain.  Initial encounter. EXAM: CHEST  2 VIEW COMPARISON:  Chest radiograph performed 07/15/2015 FINDINGS: The lungs are well-aerated. Vascular congestion is noted. Increased interstitial markings raise concern for mild pulmonary edema, though pneumonia could have a similar appearance. There is no evidence of pleural effusion or pneumothorax. The heart is normal in size. The patient is status post median sternotomy, with evidence of prior CABG. No acute osseous abnormalities are seen. IMPRESSION: Vascular  congestion noted. Increased interstitial markings raise concern for mild pulmonary edema, though pneumonia could have a similar appearance. Electronically Signed   By: Garald Balding M.D.   On: 11/05/2015 03:55    Review of Systems  Constitutional: Positive for malaise/fatigue.  HENT: Negative.   Eyes: Negative.   Respiratory: Positive for shortness of breath.   Cardiovascular: Positive for chest pain and orthopnea.  Gastrointestinal: Positive for heartburn, blood in stool and melena.  Genitourinary: Negative.   Musculoskeletal: Positive for back pain.  Skin: Negative.   Neurological: Positive for weakness.  Psychiatric/Behavioral: Negative.    Blood pressure 124/46, pulse 81, temperature 97.8 F (36.6 C), temperature source Oral, resp. rate 18, height $RemoveBe'6\' 3"'[id number]$  (1.905 m), weight 102.105 kg (225 lb 1.6 oz), SpO2 90 %. Physical Exam  Nursing note and vitals reviewed. Constitutional: He is oriented to person, place, and time. He appears well-developed and well-nourished.  HENT:  Head: Normocephalic and atraumatic.  Right Ear: External ear normal. Decreased hearing is noted.  Left Ear: Decreased hearing is noted.  Eyes: Conjunctivae and EOM are normal. Pupils are equal, round, and reactive to light.  Neck: Normal range of motion. Neck supple.  Cardiovascular: Normal rate, regular rhythm, normal heart sounds and intact distal pulses.   Respiratory: Effort normal and breath sounds normal.  GI: Soft. Bowel sounds are normal.  Musculoskeletal: Normal range of motion.  Neurological: He is alert and oriented to person, place, and time. He has normal reflexes.  Skin: Skin is warm and dry.  Psychiatric: He has a normal mood and affect.    Assessment/Plan:  coronary artery disease  anemia  GI bleed  hypertension  diabetes  hyperlipidemia  COPD  PCI and stent  history of  coronary bypass surgery  elevated troponin  chest pain GERD  angina  possible demand ischemia . Marland Kitchen PLAN   transfused to a hemoglobin of 9/27  hold aspirin and Brilinta  continue blood pressure control with lisinopril metoprolol  COPD continue inhalers as necessary  diabetes continue treatment oral agents as well as insulin  consult GI for possible EGD and colonoscopy  recommend patient should be an acceptable risk for EGD once she is transfused  continue Protonix for reflux possible upper GI bleed  the patient appears to have demand ischemia related to anemia do not recommend invasive strategy     Ikea Demicco D. 11/06/2015, 12:10 PM

## 2015-11-06 NOTE — Care Management (Signed)
Patient's oxygen order with [facility] is 2 liters continuous

## 2015-11-06 NOTE — Progress Notes (Signed)
EKG completed

## 2015-11-06 NOTE — Progress Notes (Signed)
ANTIBIOTIC CONSULT NOTE - INITIAL  Pharmacy Consult for ampicillin/sulbactam Indication: aspiration pneumonia  No Known Allergies  Patient Measurements: Height: 6\' 3"  (190.5 cm) Weight: 225 lb 1.6 oz (102.105 kg) IBW/kg (Calculated) : 84.5  Vital Signs: Temp: 97.8 F (36.6 C) (12/08 1106) Temp Source: Oral (12/08 1106) BP: 124/46 mmHg (12/08 1106) Pulse Rate: 81 (12/08 1106) Intake/Output from previous day: 12/07 0701 - 12/08 0700 In: 1338.8 [P.O.:480; I.V.:73.8; Blood:785] Out: 2395 [Urine:2300; Emesis/NG output:95] Intake/Output from this shift:    Labs:  Recent Labs  11/05/15 0319 11/05/15 1929 11/06/15 0409  WBC 9.4  --  21.3*  HGB 7.1* 8.8* 8.9*  PLT 362  --  340  CREATININE 1.10  --  1.05   Estimated Creatinine Clearance: 65.4 mL/min (by C-G formula based on Cr of 1.05).  Medications:  Anti-infectives    Start     Dose/Rate Route Frequency Ordered Stop   11/06/15 1300  Ampicillin-Sulbactam (UNASYN) 3 g in sodium chloride 0.9 % 100 mL IVPB     3 g 100 mL/hr over 60 Minutes Intravenous Every 6 hours 11/06/15 1203       Assessment: Pharmacy consulted to dose ampicillin/sulbactam for aspiration pneumonia in this 81 year old male admitted with a GIB.   CrCl is ~ 65 mL/min  Plan:  Start ampicillin/sulbactam 3 g IV q 6 hours based on renal function and indication. Pharmacy will continue to monitor and adjust dose if needed. Thank you for the consult.  Darylene Price Noga Fogg 11/06/2015,12:04 PM

## 2015-11-07 ENCOUNTER — Inpatient Hospital Stay: Payer: Medicare Other

## 2015-11-07 DIAGNOSIS — K810 Acute cholecystitis: Secondary | ICD-10-CM

## 2015-11-07 LAB — PROTIME-INR
INR: 1.39
PROTHROMBIN TIME: 17.2 s — AB (ref 11.4–15.0)

## 2015-11-07 LAB — GLUCOSE, CAPILLARY
Glucose-Capillary: 76 mg/dL (ref 65–99)
Glucose-Capillary: 106 mg/dL — ABNORMAL HIGH (ref 65–99)
Glucose-Capillary: 159 mg/dL — ABNORMAL HIGH (ref 65–99)
Glucose-Capillary: 152 mg/dL — ABNORMAL HIGH (ref 65–99)
Glucose-Capillary: 136 mg/dL — ABNORMAL HIGH (ref 65–99)
Glucose-Capillary: 154 mg/dL — ABNORMAL HIGH (ref 65–99)

## 2015-11-07 LAB — CBC
HEMATOCRIT: 27.2 % — AB (ref 40.0–52.0)
Hemoglobin: 8.7 g/dL — ABNORMAL LOW (ref 13.0–18.0)
MCH: 26 pg (ref 26.0–34.0)
MCHC: 31.8 g/dL — AB (ref 32.0–36.0)
MCV: 81.7 fL (ref 80.0–100.0)
Platelets: 335 10*3/uL (ref 150–440)
RBC: 3.33 MIL/uL — ABNORMAL LOW (ref 4.40–5.90)
RDW: 16.4 % — AB (ref 11.5–14.5)
WBC: 26.3 10*3/uL — ABNORMAL HIGH (ref 3.8–10.6)

## 2015-11-07 LAB — BASIC METABOLIC PANEL
ANION GAP: 9 (ref 5–15)
BUN: 40 mg/dL — ABNORMAL HIGH (ref 6–20)
CALCIUM: 9.1 mg/dL (ref 8.9–10.3)
CO2: 25 mmol/L (ref 22–32)
CREATININE: 2.34 mg/dL — AB (ref 0.61–1.24)
Chloride: 103 mmol/L (ref 101–111)
GFR calc Af Amer: 27 mL/min — ABNORMAL LOW (ref >60)
GFR calc non Af Amer: 24 mL/min — ABNORMAL LOW (ref >60)
GLUCOSE: 148 mg/dL — AB (ref 65–99)
Potassium: 4.4 mmol/L (ref 3.5–5.1)
Sodium: 137 mmol/L (ref 135–145)

## 2015-11-07 MED ORDER — MORPHINE SULFATE (PF) 2 MG/ML IV SOLN
1.0000 mg | INTRAVENOUS | Status: DC | PRN
  Administered 2015-11-07: 1 mg via INTRAVENOUS

## 2015-11-07 MED ORDER — FENTANYL CITRATE (PF) 100 MCG/2ML IJ SOLN
INTRAMUSCULAR | Status: AC
  Filled 2015-11-07: qty 4

## 2015-11-07 MED ORDER — AMIODARONE HCL IN DEXTROSE 360-4.14 MG/200ML-% IV SOLN
30.0000 mg/h | INTRAVENOUS | Status: DC
  Administered 2015-11-07 (×2): 30 mg/h via INTRAVENOUS
  Filled 2015-11-07 (×3): qty 200

## 2015-11-07 MED ORDER — SODIUM CHLORIDE 0.9 % IV BOLUS (SEPSIS)
500.0000 mL | Freq: Once | INTRAVENOUS | Status: AC
  Administered 2015-11-07: 500 mL via INTRAVENOUS

## 2015-11-07 MED ORDER — OXYCODONE-ACETAMINOPHEN 5-325 MG PO TABS
1.0000 | ORAL_TABLET | Freq: Four times a day (QID) | ORAL | Status: DC | PRN
  Administered 2015-11-07: 1 via ORAL
  Filled 2015-11-07: qty 1

## 2015-11-07 MED ORDER — MIDAZOLAM HCL 2 MG/2ML IJ SOLN
INTRAMUSCULAR | Status: AC | PRN
  Administered 2015-11-07: 1 mg via INTRAVENOUS

## 2015-11-07 MED ORDER — NOREPINEPHRINE BITARTRATE 1 MG/ML IV SOLN
0.0000 ug/min | INTRAVENOUS | Status: DC
  Filled 2015-11-07: qty 4

## 2015-11-07 MED ORDER — MORPHINE SULFATE (PF) 2 MG/ML IV SOLN
INTRAVENOUS | Status: AC
  Filled 2015-11-07: qty 1

## 2015-11-07 MED ORDER — PHENYLEPHRINE HCL 10 MG/ML IJ SOLN
30.0000 ug/min | INTRAVENOUS | Status: DC
  Administered 2015-11-07: 30 ug/min via INTRAVENOUS
  Filled 2015-11-07 (×2): qty 1

## 2015-11-07 MED ORDER — MIDAZOLAM HCL 5 MG/5ML IJ SOLN
INTRAMUSCULAR | Status: AC
  Filled 2015-11-07: qty 10

## 2015-11-07 MED ORDER — FENTANYL CITRATE (PF) 100 MCG/2ML IJ SOLN
INTRAMUSCULAR | Status: AC | PRN
  Administered 2015-11-07: 25 ug via INTRAVENOUS

## 2015-11-07 MED ORDER — AMIODARONE HCL IN DEXTROSE 360-4.14 MG/200ML-% IV SOLN
60.0000 mg/h | INTRAVENOUS | Status: AC
  Administered 2015-11-07 (×2): 60 mg/h via INTRAVENOUS
  Filled 2015-11-07: qty 200

## 2015-11-07 MED ORDER — DIGOXIN 0.25 MG/ML IJ SOLN
0.1250 mg | Freq: Once | INTRAMUSCULAR | Status: AC
  Administered 2015-11-07: 0.125 mg via INTRAVENOUS
  Filled 2015-11-07: qty 2

## 2015-11-07 MED ORDER — AMIODARONE LOAD VIA INFUSION
150.0000 mg | Freq: Once | INTRAVENOUS | Status: AC
  Administered 2015-11-07: 150 mg via INTRAVENOUS
  Filled 2015-11-07 (×2): qty 83.34

## 2015-11-07 NOTE — Procedures (Signed)
CT guided cholecystostomy tube placement.   10 French drain directed into gallbladder.  100 ml of brown cloudy fluid aspirated and sent for culture.  No immediate complication.  Minimal blood loss.

## 2015-11-07 NOTE — Progress Notes (Signed)
Pt heart rhythm converted from A fib to Sinus arhythmia while on Amiodarone drip at 30mg /hr. HR controlled in the 60's-70's. Dr. Jannifer Franklin notified of rhythm conversion and rate change. Verbal orders to keep Pt on drip until AM. Will continue to monitor.

## 2015-11-07 NOTE — Progress Notes (Signed)
Pt's BP still low, 75/46. HR in 120s. MD Dr. Jannifer Franklin notified, orders given for an additional 581mL NS bolus. Bolus started, RN will continue to monitor. Rachael Fee, RN

## 2015-11-07 NOTE — Progress Notes (Signed)
Pt's BP dropped to 60/35, O2 sat 86%. Pt drowsy but awakens to voice and remains oriented. O2 increased to 4L via [place], O2 sat increased to 91%. CCU charge nurse called to assess pt. MD Dr. Posey Pronto notified, given orders to transfer pt to CCU to be started on Levophed drip.

## 2015-11-07 NOTE — Progress Notes (Signed)
[facility] Progress Note Patient Name: ELEAZER BARQUERO DOB: 01/22/1927 MRN: [id number]   Date of Service  11/07/2015  HPI/Events of Note  Pt with GI bleed, hypotension, A fib with RVR.  Levophed ordered.  Remains tachycardic, and no central venous access.   eICU Interventions  Will change to phenylephrine with goal MAP > 65.         Adelee Hannula 11/07/2015, 3:31 AM

## 2015-11-07 NOTE — Progress Notes (Signed)
Chief Complaint: Patient was seen in consultation today for percutaneous cholecystostomy placement  Referring Physician(s): Phoebe Perch, MD   History of Present Illness: Justin Leonard is a 81 y.o. male with multiple medical problems.  Came to hospital with chest pain and found have GI bleeding.  CT scan of abdomen demonstrated an inflamed gallbladder with a small amount of intraluminal air.  [place] also suggested acute cholecystitis.  Patient has been tachycardiac and previously he was treated for hypotension.  Currently, the patient is alert and resting comfortably.  He complains of some RUQ pain.    Past Medical History  Diagnosis Date  . CHF (congestive heart failure) ([place])   . Hypertension   . Diabetes mellitus without complication ([place])   . Coronary artery disease   . Hyperlipidemia   . Deafness   . Peripheral neuropathy ([place])   . Back pain   . Umbilical hernia   . Depression   . Melena   . COPD (chronic obstructive pulmonary disease) ([place])   . Skin cancer     Past Surgical History  Procedure Laterality Date  . Coronary artery bypass graft    . Cardiac catheterization Left 07/17/2015    Procedure: Right/Left Heart Cath and Coronary Angiography;  Surgeon: [name], MD;  Location: [facility];  Service: Cardiovascular;  Laterality: Left;  . Cardiac catheterization N/A 07/17/2015    Procedure: Coronary Stent Intervention;  Surgeon: [name], MD;  Location: [facility];  Service: Cardiovascular;  Laterality: N/A;  . Coronary angioplasty      Allergies: Review of patient's allergies indicates no known allergies.  Medications: Prior to Admission medications   Medication Sig Start Date End Date Taking? Authorizing Provider  acetaminophen (TYLENOL) 325 MG tablet Take 650 mg by mouth every 6 (six) hours as needed.   Yes Historical Provider, MD  aspirin EC 81 MG tablet Take 81 mg by mouth daily.   Yes Historical Provider, MD  FLUoxetine  (PROZAC) 20 MG capsule Take 20 mg by mouth daily.   Yes Historical Provider, MD  furosemide (LASIX) 20 MG tablet Take 20-40 mg by mouth daily as needed for fluid.    Yes Historical Provider, MD  glyBURIDE (DIABETA) 5 MG tablet Take 5 mg by mouth 2 (two) times daily with a meal.   Yes Historical Provider, MD  lisinopril (PRINIVIL,ZESTRIL) 40 MG tablet Take 40 mg by mouth daily.   Yes Historical Provider, MD  lovastatin (MEVACOR) 20 MG tablet Take 20 mg by mouth at bedtime.   Yes Historical Provider, MD  metFORMIN (GLUCOPHAGE) 500 MG tablet Take 500 mg by mouth 2 (two) times daily with a meal. 2 tablets in AM and 1 tablet in PM   Yes Historical Provider, MD  metoprolol (LOPRESSOR) 50 MG tablet Take 50 mg by mouth 2 (two) times daily.   Yes Historical Provider, MD  nitroGLYCERIN (NITROSTAT) 0.4 MG SL tablet Place 1 tablet under the tongue every 5 (five) minutes as needed. 07/15/15 07/14/16 Yes Historical Provider, MD  pantoprazole (PROTONIX) 40 MG tablet Take 40 mg by mouth daily.   Yes Historical Provider, MD  ticagrelor (BRILINTA) 90 MG TABS tablet Take 1 tablet (90 mg total) by mouth 2 (two) times daily. 07/18/15  Yes Demetrios Loll, MD  Tiotropium Bromide Monohydrate (SPIRIVA RESPIMAT) 2.5 MCG/ACT AERS Inhale 2 puffs into the lungs daily.   Yes Historical Provider, MD  VENTOLIN HFA 108 (90 BASE) MCG/ACT inhaler Inhale 1-2 puffs into the  lungs as needed. 06/24/15  Yes Historical Provider, MD  furosemide (LASIX) 40 MG tablet Take 1 tablet (40 mg total) by mouth 2 (two) times daily. Patient not taking: Reported on 11/05/2015 07/18/15   Demetrios Loll, MD     Family History  Problem Relation Age of Onset  . Anemia Neg Hx   . Arrhythmia Neg Hx   . Asthma Neg Hx   . Clotting disorder Neg Hx   . Fainting Neg Hx   . Heart attack Neg Hx   . Heart disease Neg Hx   . Heart failure Neg Hx   . Hyperlipidemia Neg Hx   . Hypertension Father   . CVA Father     Social History   Social History  . Marital Status:  Married    Spouse Name: N/A  . Number of Children: N/A  . Years of Education: N/A   Social History Main Topics  . Smoking status: Former Smoker -- 3.00 packs/day for 30 years    Types: Cigarettes    Quit date: 04/16/1978  . Smokeless tobacco: Never Used     Comment: Quit smoking 1979  . Alcohol Use: No  . Drug Use: No  . Sexual Activity: Not Asked   Other Topics Concern  . None   Social History Narrative  .  Review of Systems  Gastrointestinal: Positive for abdominal pain.       Right upper quadrant pain.    Vital Signs: BP 115/61 mmHg  Pulse 109  Temp(Src) 98.1 F (36.7 C) (Oral)  Resp 18  Ht 6\' 3"  (1.905 m)  Wt 230 lb 6.1 oz (104.5 kg)  BMI 28.80 kg/m2  SpO2 95%  Physical Exam  Cardiovascular:  Tachycardiac, mild irregularity  Pulmonary/Chest: Effort normal and breath sounds normal. No respiratory distress.  Abdominal: There is tenderness.  Tender to palpation in RUQ    Mallampati Score:     Imaging: Dg Chest 1 View  11/06/2015  CLINICAL DATA:  Shortness of breath and weakness. EXAM: CHEST 1 VIEW COMPARISON:  11/05/2015. FINDINGS: Trachea is midline. Heart is at the upper limits of normal in size to mildly enlarged. Thoracic aorta is calcified. There is basilar interstitial prominence and indistinctness, new. Small left pleural effusion also new. IMPRESSION: New basilar dependent interstitial prominence/indistinctness and small left pleural effusion, favoring congestive heart failure. Electronically Signed   By: Lorin Picket M.D.   On: 11/06/2015 13:22   Dg Chest 2 View  11/05/2015  CLINICAL DATA:  Acute onset of mid chest pain.  Initial encounter. EXAM: CHEST  2 VIEW COMPARISON:  Chest radiograph performed 07/15/2015 FINDINGS: The lungs are well-aerated. Vascular congestion is noted. Increased interstitial markings raise concern for mild pulmonary edema, though pneumonia could have a similar appearance. There is no evidence of pleural effusion or  pneumothorax. The heart is normal in size. The patient is status post median sternotomy, with evidence of prior CABG. No acute osseous abnormalities are seen. IMPRESSION: Vascular congestion noted. Increased interstitial markings raise concern for mild pulmonary edema, though pneumonia could have a similar appearance. Electronically Signed   By: Garald Balding M.D.   On: 11/05/2015 03:55   Ct Abdomen Pelvis W Contrast  11/06/2015  CLINICAL DATA:  Chest pain at rest. Nauseated. Intermittent symptoms for 4 weeks. Nausea and diarrhea over Thanksgiving. Decreased hemoglobin. EXAM: CT ABDOMEN AND PELVIS WITH CONTRAST TECHNIQUE: Multidetector CT imaging of the abdomen and pelvis was performed using the standard protocol following bolus administration of intravenous contrast. CONTRAST:  137mL OMNIPAQUE IOHEXOL 300 MG/ML  SOLN COMPARISON:  None FINDINGS: Lower chest: There is left lower lobe atelectasis. Coronary artery calcifications are present. Heart is mildly enlarged. No pericardial effusion. Upper abdomen: New no focal abnormality identified within the liver, spleen, pancreas, or right adrenal gland. Left adrenal adenoma measured/ 1.4 x 2.0 cm. There is symmetric enhancement of both kidneys. Small left lower pole cyst is identified. No hydronephrosis. The gallbladder is distended and has a thickened wall. There is significant pericholecystic inflammatory change. Small amount of gas is identified in the nondependent gallbladder fundus, suspicious for gas-forming organisms. Gastrointestinal tract: The stomach and small bowel loops are normal in appearance. The appendix is normal in appearance. Numerous colonic diverticula are present. There is large stool burden, particularly within the rectosigmoid colon. Pelvis: Large prostate with prominent impression upon the bladder. Urinary bladder is normal in appearance. No free pelvic fluid. Retroperitoneum: Atherosclerotic calcification of the abdominal aorta and branches.  No aneurysm. Abdominal wall: Small fat containing supraumbilical hernia Osseous structures: Significant lumbar spondylosis. No suspicious lytic or blastic lesions are identified. IMPRESSION: 1. Distended, thick-walled gallbladder with significant pericholecystic stranding. Gas within the gallbladder wall is suspicious for infection. Further evaluation with ultrasound is recommended. 2. Coronary artery disease. 3. Small left renal cyst. 4. Colonic diverticulosis.  Moderate stool burden. 5. Enlarged prostate. 6. Small fat containing supraumbilical hernia. Electronically Signed   By: Nolon Nations M.D.   On: 11/06/2015 14:05   US Abdomen Limited Ruq  11/07/2015  CLINICAL DATA:  Right upper quadrant pain. EXAM: US ABDOMEN LIMITED - RIGHT UPPER QUADRANT COMPARISON:  CT 11/06/2015. FINDINGS: Gallbladder: Gallbladder is distended. Sludge is noted throughout the gallbladder. Gallbladder wall is thickened to 7 mm. Small amount of pericholecystic fluid noted. Positive Murphy sign. Findings are consistent with cholecystitis. Common bile duct: Diameter: 6 mm. Liver: No focal hepatic abnormality identified. Hepatic echotexture normal. Left hepatic lobe not well visualized due to overlying bowel gas. IMPRESSION: Distended gallbladder with sludge. Gallbladder wall thickening. Small amount of pericholecystic fluid. Positive Murphy sign. Findings consistent with gallbladder sludge with associated cholecystitis. No biliary ductal distention . Electronically Signed   By: Marcello Moores  Register   On: 11/07/2015 10:04    Labs:  CBC:  Recent Labs  07/16/15 0441 11/05/15 0319 11/05/15 1929 11/06/15 0409 11/07/15 0701  WBC 6.0 9.4  --  21.3* 26.3*  HGB 9.5* 7.1* 8.8* 8.9* 8.7*  HCT 30.2* 22.8*  --  28.3* 27.2*  PLT 246 362  --  340 335    COAGS:  Recent Labs  11/07/15 1159  INR 1.39    BMP:  Recent Labs  07/18/15 0417 11/05/15 0319 11/06/15 0409 11/07/15 0701  NA 138 139 138 137  K 4.8 3.8 3.7 4.4  CL  102 106 102 103  CO2 29 27 29 25   GLUCOSE 339* 180* 254* 148*  BUN 38* 22* 20 40*  CALCIUM 9.6 9.8 10.0 9.1  CREATININE 1.02 1.10 1.05 2.34*  GFRNONAA >60 59* >60 24*  GFRAA >60 >60 >60 27*    LIVER FUNCTION TESTS:  Recent Labs  07/15/15 1203 11/06/15 1235  BILITOT 0.9 1.2  AST 23 11*  ALT 13* 10*  ALKPHOS 65 51  PROT 6.6 7.1  ALBUMIN 3.3* 3.4*    TUMOR MARKERS: No results for input(s): AFPTM, CEA, CA199, CHROMGRNA in the last 8760 hours.  Assessment and Plan:  81 yo with acalculous cholecystitis by [place] and CT.  WBC is increasing.  Patient was evaluated by surgery,  who recommended an urgent cholecystostomy tube placement.  Discussed catheter placement with patient and family in depth.  They understand that the tube will stay in place for minimum of 6 weeks unless he has a cholecystectomy first.  We discussed the risk of bleeding related to this procedure and the fact that he only stopped the anti-platelet drugs 3 days ago.  Patient and family have a good understanding of the procedure and informed consent obtained.  Plan for CT guided cholecystostomy tube placement with moderate sedation.      SignedCarylon Perches 11/07/2015, 2:41 PM   I

## 2015-11-07 NOTE — Sedation Documentation (Signed)
100cc of bile like fluid obtained by Dr. Anselm Pancoast from placement of drainage tube. Pt pain managed with meds given and tolerated procedure well. Report to Adam,RN of ICU given by A. Shamblin,RN

## 2015-11-07 NOTE — Progress Notes (Signed)
This nurse was notified from a 2A nurse to come and see a patient with a low BP. Pt in bed with a low pressure that has not rebounded since receiving ordered boluses. Pt is alert family at bedside HR elevated 120's orders were given to transfer to ICU and start on levophed for BP.

## 2015-11-07 NOTE — Progress Notes (Signed)
After reviewing EKG, MD Dr. Posey Pronto ordered digoxin 0.5mg  IVP. Medication administered. Pt's blood pressure low (see flowsheets), MD. Dr. Jannifer Franklin notified. Orders given to bolus 557mL NS and call back with BP update after bolus. RN will continue to monitor. Rachael Fee, RN

## 2015-11-07 NOTE — Care Management (Signed)
Patient was transferred to icu for sustained heart rate > 150 and hypotension.  Received IV Digoxin, IVF bolus for hypotension.  Was placed on Amiodarone drip but it is now on hold

## 2015-11-07 NOTE — Consult Note (Signed)
  GI Inpatient Follow-up Note  Patient Identification: Justin Leonard is a 81 y.o. male  Subjective: Now in ICU with low BP and rapid HR. On pressors now. HR around 110. Seen by surgery last night. U/S just done. HIDA pending. Starting to have some RUQ pain now. On Abx. Hgb stable. No signs of active bleeding at this time.  Scheduled Inpatient Medications:  . amiodarone  150 mg Intravenous Once  . ampicillin-sulbactam (UNASYN) IV  3 g Intravenous Q6H  . docusate sodium  100 mg Oral BID  . FLUoxetine  20 mg Oral Daily  . insulin aspart  0-9 Units Subcutaneous TID WC  . metoprolol  50 mg Oral BID  . pantoprazole (PROTONIX) IV  40 mg Intravenous Q12H  . pravastatin  20 mg Oral q1800  . sodium chloride  3 mL Intravenous Q12H  . tiotropium  1 capsule Inhalation Daily    Continuous Inpatient Infusions:   . sodium chloride 50 mL/hr at 11/07/15 0013  . amiodarone 60 mg/hr (11/07/15 0952)   Followed by  . amiodarone    . phenylephrine (NEO-SYNEPHRINE) Adult infusion 30 mcg/min (11/07/15 0349)    PRN Inpatient Medications:  acetaminophen **OR** acetaminophen, albuterol, hydrALAZINE, nitroGLYCERIN, ondansetron **OR** ondansetron (ZOFRAN) IV, promethazine  Review of Systems: Constitutional: Weight is stable.  Eyes: No changes in vision. ENT: No oral lesions, sore throat.  GI: see HPI.  Heme/Lymph: No easy bruising.  CV: No chest pain.  GU: No hematuria.  Integumentary: No rashes.  Neuro: No headaches.  Psych: No depression/anxiety.  Endocrine: No heat/cold intolerance.  Allergic/Immunologic: No urticaria.  Resp: No cough, SOB.  Musculoskeletal: No joint swelling.    Physical Examination: BP 110/61 mmHg  Pulse 119  Temp(Src) 98.1 F (36.7 C) (Oral)  Resp 19  Ht 6\' 3"  (1.905 m)  Wt 104.5 kg (230 lb 6.1 oz)  BMI 28.80 kg/m2  SpO2 96% Gen: NAD, alert and oriented x 4 HEENT: PEERLA, EOMI, Neck: supple, no JVD or thyromegaly Chest: CTA bilaterally, no wheezes, crackles,  or other adventitious sounds CV: RRR, no m/g/c/r Abd: soft, mild RUQ tenderness, ND, +BS in all four quadrants; no HSM, guarding, ridigity, or rebound tenderness Ext: no edema, well perfused with 2+ pulses, Skin: no rash or lesions noted Lymph: no LAD  Data: Lab Results  Component Value Date   WBC 26.3* 11/07/2015   HGB 8.7* 11/07/2015   HCT 27.2* 11/07/2015   MCV 81.7 11/07/2015   PLT 335 11/07/2015    Recent Labs Lab 11/05/15 1929 11/06/15 0409 11/07/15 0701  HGB 8.8* 8.9* 8.7*   Lab Results  Component Value Date   NA 138 11/06/2015   K 3.7 11/06/2015   CL 102 11/06/2015   CO2 29 11/06/2015   BUN 20 11/06/2015   CREATININE 1.05 11/06/2015   Lab Results  Component Value Date   ALT 10* 11/06/2015   AST 11* 11/06/2015   ALKPHOS 51 11/06/2015   BILITOT 1.2 11/06/2015   No results for input(s): APTT, INR, PTT in the last 168 hours. Assessment/Plan: Mr. Hays is a 81 y.o. male with rapid Afib. UGI bleeding, which has stopped off brilinta. Possible GB disease.   Recommendations: Await U/S and HIDA results. Continue Abx. Once BP and HR under control, then can schedule EGD by Monday. Will have Dr. Rayann Heman check on pt this weekend. Thanks. Please call with questions or concerns.  Tonesha Tsou, Lupita Dawn, MD

## 2015-11-07 NOTE — Progress Notes (Signed)
ANTIBIOTIC CONSULT NOTE - INITIAL  Pharmacy Consult for ampicillin/sulbactam Indication: aspiration pneumonia  No Known Allergies  Patient Measurements: Height: 6\' 3"  (190.5 cm) Weight: 230 lb 6.1 oz (104.5 kg) IBW/kg (Calculated) : 84.5  Vital Signs: Temp: 98.1 F (36.7 C) (12/09 0300) Temp Source: Oral (12/09 0300) BP: 115/61 mmHg (12/09 1200) Pulse Rate: 109 (12/09 1200) Intake/Output from previous day: 12/08 0701 - 12/09 0700 In: 1738.3 [P.O.:240; I.V.:1198.3; IV Piggyback:300] Out: 425 [Urine:425] Intake/Output from this shift:    Labs:  Recent Labs  11/05/15 0319 11/05/15 1929 11/06/15 0409 11/07/15 0701  WBC 9.4  --  21.3* 26.3*  HGB 7.1* 8.8* 8.9* 8.7*  PLT 362  --  340 335  CREATININE 1.10  --  1.05 2.34*   Estimated Creatinine Clearance: 29.6 mL/min (by C-G formula based on Cr of 2.34).  Medications:  Anti-infectives    Start     Dose/Rate Route Frequency Ordered Stop   11/06/15 1300  Ampicillin-Sulbactam (UNASYN) 3 g in sodium chloride 0.9 % 100 mL IVPB     3 g 100 mL/hr over 60 Minutes Intravenous Every 6 hours 11/06/15 1203       Assessment: Pharmacy consulted to dose ampicillin/sulbactam for aspiration pneumonia in this 81 year old male admitted with a GIB.   Plan:  Continue ampicillin/sulbactam 3 g IV q 6 hours for now. Patient's renal function is borderline for decreasing dose to q 12 hours. Will f/u AM SCr.  Pharmacy will continue to monitor and adjust dose if needed. Thank you for the consult.  Ulice Dash D 11/07/2015,2:52 PM

## 2015-11-07 NOTE — Progress Notes (Signed)
CC: Right upper quadrant pain Subjective: Patient transferred to the ICU for tachycardia. He has increasing right upper quadrant pain and tenderness. Denies nausea or vomiting.  Objective: Vital signs in last 24 hours: Temp:  [97.8 F (36.6 C)-99.8 F (37.7 C)] 98.1 F (36.7 C) (12/09 0300) Pulse Rate:  [60-147] 119 (12/09 0600) Resp:  [17-21] 19 (12/09 0600) BP: (60-134)/(34-96) 110/61 mmHg (12/09 0600) SpO2:  [81 %-97 %] 96 % (12/09 0600) Weight:  [230 lb 6.1 oz (104.5 kg)] 230 lb 6.1 oz (104.5 kg) (12/09 0500) Last BM Date: 11/06/15  Intake/Output from previous day: 12/08 0701 - 12/09 0700 In: 1738.3 [P.O.:240; I.V.:1198.3; IV Piggyback:300] Out: 425 [Urine:425] Intake/Output this shift:    Physical exam:  Vital signs are reviewed. His heart rate is tachycardic. No jaundice no scleral icterus Abdomen is soft but tender in the right upper quadrant with a positive Murphy sign Calves are nontender.  Labs are reviewed as is the ultrasound and compared to prior CT scan. This is all suggestive of acute cholecystitis   Lab Results: CBC   Recent Labs  11/06/15 0409 11/07/15 0701  WBC 21.3* 26.3*  HGB 8.9* 8.7*  HCT 28.3* 27.2*  PLT 340 335   BMET  Recent Labs  11/05/15 0319 11/06/15 0409  NA 139 138  K 3.8 3.7  CL 106 102  CO2 27 29  GLUCOSE 180* 254*  BUN 22* 20  CREATININE 1.10 1.05  CALCIUM 9.8 10.0   PT/INR No results for input(s): LABPROT, INR in the last 72 hours. ABG No results for input(s): PHART, HCO3 in the last 72 hours.  Invalid input(s): PCO2, PO2  Studies/Results: Dg Chest 1 View  11/06/2015  CLINICAL DATA:  Shortness of breath and weakness. EXAM: CHEST 1 VIEW COMPARISON:  11/05/2015. FINDINGS: Trachea is midline. Heart is at the upper limits of normal in size to mildly enlarged. Thoracic aorta is calcified. There is basilar interstitial prominence and indistinctness, new. Small left pleural effusion also new. IMPRESSION: New basilar  dependent interstitial prominence/indistinctness and small left pleural effusion, favoring congestive heart failure. Electronically Signed   By: Lorin Picket M.D.   On: 11/06/2015 13:22   Ct Abdomen Pelvis W Contrast  11/06/2015  CLINICAL DATA:  Chest pain at rest. Nauseated. Intermittent symptoms for 4 weeks. Nausea and diarrhea over Thanksgiving. Decreased hemoglobin. EXAM: CT ABDOMEN AND PELVIS WITH CONTRAST TECHNIQUE: Multidetector CT imaging of the abdomen and pelvis was performed using the standard protocol following bolus administration of intravenous contrast. CONTRAST:  189mL OMNIPAQUE IOHEXOL 300 MG/ML  SOLN COMPARISON:  None FINDINGS: Lower chest: There is left lower lobe atelectasis. Coronary artery calcifications are present. Heart is mildly enlarged. No pericardial effusion. Upper abdomen: New no focal abnormality identified within the liver, spleen, pancreas, or right adrenal gland. Left adrenal adenoma measured/ 1.4 x 2.0 cm. There is symmetric enhancement of both kidneys. Small left lower pole cyst is identified. No hydronephrosis. The gallbladder is distended and has a thickened wall. There is significant pericholecystic inflammatory change. Small amount of gas is identified in the nondependent gallbladder fundus, suspicious for gas-forming organisms. Gastrointestinal tract: The stomach and small bowel loops are normal in appearance. The appendix is normal in appearance. Numerous colonic diverticula are present. There is large stool burden, particularly within the rectosigmoid colon. Pelvis: Large prostate with prominent impression upon the bladder. Urinary bladder is normal in appearance. No free pelvic fluid. Retroperitoneum: Atherosclerotic calcification of the abdominal aorta and branches. No aneurysm. Abdominal wall: Small fat containing  supraumbilical hernia Osseous structures: Significant lumbar spondylosis. No suspicious lytic or blastic lesions are identified. IMPRESSION: 1.  Distended, thick-walled gallbladder with significant pericholecystic stranding. Gas within the gallbladder wall is suspicious for infection. Further evaluation with ultrasound is recommended. 2. Coronary artery disease. 3. Small left renal cyst. 4. Colonic diverticulosis.  Moderate stool burden. 5. Enlarged prostate. 6. Small fat containing supraumbilical hernia. Electronically Signed   By: Nolon Nations M.D.   On: 11/06/2015 14:05   US Abdomen Limited Ruq  11/07/2015  CLINICAL DATA:  Right upper quadrant pain. EXAM: US ABDOMEN LIMITED - RIGHT UPPER QUADRANT COMPARISON:  CT 11/06/2015. FINDINGS: Gallbladder: Gallbladder is distended. Sludge is noted throughout the gallbladder. Gallbladder wall is thickened to 7 mm. Small amount of pericholecystic fluid noted. Positive Murphy sign. Findings are consistent with cholecystitis. Common bile duct: Diameter: 6 mm. Liver: No focal hepatic abnormality identified. Hepatic echotexture normal. Left hepatic lobe not well visualized due to overlying bowel gas. IMPRESSION: Distended gallbladder with sludge. Gallbladder wall thickening. Small amount of pericholecystic fluid. Positive Murphy sign. Findings consistent with gallbladder sludge with associated cholecystitis. No biliary ductal distention . Electronically Signed   By: Marcello Moores  Register   On: 11/07/2015 10:04    Anti-infectives: Anti-infectives    Start     Dose/Rate Route Frequency Ordered Stop   11/06/15 1300  Ampicillin-Sulbactam (UNASYN) 3 g in sodium chloride 0.9 % 100 mL IVPB     3 g 100 mL/hr over 60 Minutes Intravenous Every 6 hours 11/06/15 1203        Assessment/Plan:  Acute cholecystitis with multiple medical problems. Patient would benefit from urgent ultrasound-guided cholecystostomy tube placement. I discussed this with the family that this would improve his addition considerably. I see no sign for the need for a HIDA scan at this point and I have canceled that.  The antiplatelet drugs  that the patient is on has a shorter half life and he has been off of it for 3 days therefore could safely proceed with percutaneous drainage.  Florene Glen, MD, FACS  11/07/2015

## 2015-11-07 NOTE — Progress Notes (Signed)
[place] at [facility]                                                                                                                                                                                            Patient Demographics   Justin Leonard, is a 81 y.o. male, DOB - 1927-10-29, WQ:[id number]  Admit date - 11/05/2015   Admitting Physician Harrie Foreman, MD  Outpatient Primary MD for the patient is Olmedo, Guy Begin, MD   LOS - 2  Subjective: Overnight had episode of hypotension and heart rate went up into the 150s. He was transferred to the ICU. Currently on Neo-Synephrine drip. Patient also now complains of right upper quadrant pain. No further melena noted per patient     Review of Systems:   CONSTITUTIONAL: No documented fever.  Positive fatigue and weakness. No weight gain, no weight loss.  EYES: No blurry or double vision.  ENT: No tinnitus. No postnasal drip. No redness of the oropharynx.  RESPIRATORY: No cough, no wheeze, no hemoptysis. Positive  dyspnea.  CARDIOVASCULAR: No chest pain. No orthopnea. No palpitations. No syncope.  GASTROINTESTINAL: No nausea, no vomiting or diarrhea. Right upper quadrant abdominal pain.  Positive melena or hematochezia.  GENITOURINARY: No dysuria or hematuria.  ENDOCRINE: No polyuria or nocturia. No heat or cold intolerance.  HEMATOLOGY: No anemia. No bruising. No bleeding.  INTEGUMENTARY: No rashes. No lesions.  MUSCULOSKELETAL: No arthritis. No swelling. No gout.  NEUROLOGIC: No numbness, tingling, or ataxia. No seizure-type activity.  PSYCHIATRIC: No anxiety. No insomnia. No ADD.    Vitals:   Filed Vitals:   11/07/15 0600 11/07/15 1000 11/07/15 1100 11/07/15 1200  BP: 110/61 122/70 110/61 115/61  Pulse: 119 121 106 109  Temp:      TempSrc:      Resp: 19 20 20 18   Height:      Weight:      SpO2: 96% 95% 96% 95%    Wt Readings from Last 3 Encounters:  11/07/15 104.5 kg (230 lb  6.1 oz)  09/30/15 110.678 kg (244 lb)  07/29/15 107.049 kg (236 lb)     Intake/Output Summary (Last 24 hours) at 11/07/15 1351 Last data filed at 11/07/15 0600  Gross per 24 hour  Intake 1738.25 ml  Output    300 ml  Net 1438.25 ml    Physical Exam:   GENERAL: Pleasant-appearing in no apparent distress.  HEAD, EYES, EARS, NOSE AND THROAT: Atraumatic, normocephalic. Extraocular muscles are intact. Pupils equal and reactive to light. Sclerae anicteric. No conjunctival injection. No oro-pharyngeal erythema.  NECK: Supple. There is  no jugular venous distention. No bruits, no lymphadenopathy, no thyromegaly.  HEART: Irregularly irregular heart and rhythm,. No murmurs, no rubs, no clicks.  LUNGS: Clear to auscultation bilaterally. No rales or rhonchi. No wheezes.  ABDOMEN: Soft, flat, nontender, nondistended. Has good bowel sounds. No hepatosplenomegaly appreciated. Right upper quadrant tenderness EXTREMITIES: No evidence of any cyanosis, clubbing, or peripheral edema.  +2 pedal and radial pulses bilaterally.  NEUROLOGIC: The patient is alert, awake, and oriented x3 with no focal motor or sensory deficits appreciated bilaterally.  SKIN: Moist and warm with no rashes appreciated.  Psych: Not anxious, depressed LN: No inguinal LN enlargement    Antibiotics   Anti-infectives    Start     Dose/Rate Route Frequency Ordered Stop   11/06/15 1300  Ampicillin-Sulbactam (UNASYN) 3 g in sodium chloride 0.9 % 100 mL IVPB     3 g 100 mL/hr over 60 Minutes Intravenous Every 6 hours 11/06/15 1203        Medications   Scheduled Meds: . ampicillin-sulbactam (UNASYN) IV  3 g Intravenous Q6H  . docusate sodium  100 mg Oral BID  . FLUoxetine  20 mg Oral Daily  . insulin aspart  0-9 Units Subcutaneous TID WC  . metoprolol  50 mg Oral BID  . pantoprazole (PROTONIX) IV  40 mg Intravenous Q12H  . pravastatin  20 mg Oral q1800  . sodium chloride  3 mL Intravenous Q12H  . tiotropium  1 capsule  Inhalation Daily   Continuous Infusions: . sodium chloride 50 mL/hr at 11/07/15 0013  . amiodarone Stopped (11/07/15 1302)   Followed by  . amiodarone     PRN Meds:.acetaminophen **OR** acetaminophen, albuterol, hydrALAZINE, nitroGLYCERIN, ondansetron **OR** ondansetron (ZOFRAN) IV, promethazine   Data Review:   Micro Results Recent Results (from the past 240 hour(s))  Culture, blood (routine x 2)     Status: None (Preliminary result)   Collection Time: 11/06/15 12:35 PM  Result Value Ref Range Status   Specimen Description BLOOD RIGHT ASSIST CONTROL  Final   Special Requests   Final    BOTTLES DRAWN AEROBIC AND ANAEROBIC  [place] ANAERO [place] AERO   Culture NO GROWTH < 24 HOURS  Final   Report Status PENDING  Incomplete  Culture, blood (routine x 2)     Status: None (Preliminary result)   Collection Time: 11/06/15 12:38 PM  Result Value Ref Range Status   Specimen Description BLOOD LEFT HAND  Final   Special Requests BOTTLES DRAWN AEROBIC AND ANAEROBIC  3 CC  Final   Culture NO GROWTH < 24 HOURS  Final   Report Status PENDING  Incomplete    Radiology Reports Dg Chest 1 View  11/06/2015  CLINICAL DATA:  Shortness of breath and weakness. EXAM: CHEST 1 VIEW COMPARISON:  11/05/2015. FINDINGS: Trachea is midline. Heart is at the upper limits of normal in size to mildly enlarged. Thoracic aorta is calcified. There is basilar interstitial prominence and indistinctness, new. Small left pleural effusion also new. IMPRESSION: New basilar dependent interstitial prominence/indistinctness and small left pleural effusion, favoring congestive heart failure. Electronically Signed   By: Lorin Picket M.D.   On: 11/06/2015 13:22   Dg Chest 2 View  11/05/2015  CLINICAL DATA:  Acute onset of mid chest pain.  Initial encounter. EXAM: CHEST  2 VIEW COMPARISON:  Chest radiograph performed 07/15/2015 FINDINGS: The lungs are well-aerated. Vascular congestion is noted. Increased interstitial markings raise  concern for mild pulmonary edema, though pneumonia could have a similar appearance.  There is no evidence of pleural effusion or pneumothorax. The heart is normal in size. The patient is status post median sternotomy, with evidence of prior CABG. No acute osseous abnormalities are seen. IMPRESSION: Vascular congestion noted. Increased interstitial markings raise concern for mild pulmonary edema, though pneumonia could have a similar appearance. Electronically Signed   By: Garald Balding M.D.   On: 11/05/2015 03:55   Ct Abdomen Pelvis W Contrast  11/06/2015  CLINICAL DATA:  Chest pain at rest. Nauseated. Intermittent symptoms for 4 weeks. Nausea and diarrhea over Thanksgiving. Decreased hemoglobin. EXAM: CT ABDOMEN AND PELVIS WITH CONTRAST TECHNIQUE: Multidetector CT imaging of the abdomen and pelvis was performed using the standard protocol following bolus administration of intravenous contrast. CONTRAST:  12mL OMNIPAQUE IOHEXOL 300 MG/ML  SOLN COMPARISON:  None FINDINGS: Lower chest: There is left lower lobe atelectasis. Coronary artery calcifications are present. Heart is mildly enlarged. No pericardial effusion. Upper abdomen: New no focal abnormality identified within the liver, spleen, pancreas, or right adrenal gland. Left adrenal adenoma measured/ 1.4 x 2.0 cm. There is symmetric enhancement of both kidneys. Small left lower pole cyst is identified. No hydronephrosis. The gallbladder is distended and has a thickened wall. There is significant pericholecystic inflammatory change. Small amount of gas is identified in the nondependent gallbladder fundus, suspicious for gas-forming organisms. Gastrointestinal tract: The stomach and small bowel loops are normal in appearance. The appendix is normal in appearance. Numerous colonic diverticula are present. There is large stool burden, particularly within the rectosigmoid colon. Pelvis: Large prostate with prominent impression upon the bladder. Urinary bladder is  normal in appearance. No free pelvic fluid. Retroperitoneum: Atherosclerotic calcification of the abdominal aorta and branches. No aneurysm. Abdominal wall: Small fat containing supraumbilical hernia Osseous structures: Significant lumbar spondylosis. No suspicious lytic or blastic lesions are identified. IMPRESSION: 1. Distended, thick-walled gallbladder with significant pericholecystic stranding. Gas within the gallbladder wall is suspicious for infection. Further evaluation with ultrasound is recommended. 2. Coronary artery disease. 3. Small left renal cyst. 4. Colonic diverticulosis.  Moderate stool burden. 5. Enlarged prostate. 6. Small fat containing supraumbilical hernia. Electronically Signed   By: Nolon Nations M.D.   On: 11/06/2015 14:05   US Abdomen Limited Ruq  11/07/2015  CLINICAL DATA:  Right upper quadrant pain. EXAM: US ABDOMEN LIMITED - RIGHT UPPER QUADRANT COMPARISON:  CT 11/06/2015. FINDINGS: Gallbladder: Gallbladder is distended. Sludge is noted throughout the gallbladder. Gallbladder wall is thickened to 7 mm. Small amount of pericholecystic fluid noted. Positive Murphy sign. Findings are consistent with cholecystitis. Common bile duct: Diameter: 6 mm. Liver: No focal hepatic abnormality identified. Hepatic echotexture normal. Left hepatic lobe not well visualized due to overlying bowel gas. IMPRESSION: Distended gallbladder with sludge. Gallbladder wall thickening. Small amount of pericholecystic fluid. Positive Murphy sign. Findings consistent with gallbladder sludge with associated cholecystitis. No biliary ductal distention . Electronically Signed   By: Marcello Moores  Register   On: 11/07/2015 10:04     CBC  Recent Labs Lab 11/05/15 0319 11/05/15 1929 11/06/15 0409 11/07/15 0701  WBC 9.4  --  21.3* 26.3*  HGB 7.1* 8.8* 8.9* 8.7*  HCT 22.8*  --  28.3* 27.2*  PLT 362  --  340 335  MCV 79.9*  --  80.4 81.7  MCH 24.8*  --  25.3* 26.0  MCHC 31.0*  --  31.4* 31.8*  RDW 15.3*  --   15.9* 16.4*    Chemistries   Recent Labs Lab 11/05/15 0319 11/06/15 0409 11/06/15 1235 11/07/15 0701  NA 139 138  --  137  K 3.8 3.7  --  4.4  CL 106 102  --  103  CO2 27 29  --  25  GLUCOSE 180* 254*  --  148*  BUN 22* 20  --  40*  CREATININE 1.10 1.05  --  2.34*  CALCIUM 9.8 10.0  --  9.1  AST  --   --  11*  --   ALT  --   --  10*  --   ALKPHOS  --   --  51  --   BILITOT  --   --  1.2  --    ------------------------------------------------------------------------------------------------------------------ estimated creatinine clearance is 29.6 mL/min (by C-G formula based on Cr of 2.34). ------------------------------------------------------------------------------------------------------------------  Recent Labs  11/05/15 0319  HGBA1C 5.4   ------------------------------------------------------------------------------------------------------------------ No results for input(s): CHOL, HDL, LDLCALC, TRIG, CHOLHDL, LDLDIRECT in the last 72 hours. ------------------------------------------------------------------------------------------------------------------  Recent Labs  11/05/15 0319  TSH 3.455   ------------------------------------------------------------------------------------------------------------------  Recent Labs  11/05/15 0929  VITAMINB12 132*  FOLATE 12.0  FERRITIN 12*  TIBC 412  IRON 23*    Coagulation profile  Recent Labs Lab 11/07/15 1159  INR 1.39    No results for input(s): DDIMER in the last 72 hours.  Cardiac Enzymes  Recent Labs Lab 11/06/15 0409 11/06/15 1010 11/06/15 1547  TROPONINI 0.08* 0.05* 0.06*   ------------------------------------------------------------------------------------------------------------------ Invalid input(s): POCBNP    Assessment & Plan    This is an 81 year old Caucasian male with past medical history significant for coronary artery disease status post CABG and PCI now has a GI bleed and  symptomatic anemia. 1. Hypotension: Due to sepsis likely gallbladder source. C appreciate surgery input not a candidate for operating room but will likely need percutaneous drainage.  2. Atrial fibrillation with rapid ventricular rate associated with hypotension at this point I will start patient on amiodarone drip. Cardiology is following the patient 3. GI bleed: due to melena likely upper protonix bid, seen by gi they would like cardiac clearance  And hold brilinta EGD timing per GI 4. Nausea vomiting: Due to acute cholecystitis continue Unasyn and follow CBC 5. Chest pain: Likely anginal secondary to anemia. Troponin is negative.  Cards consult appreciated they feel that this is not an MI. Recommend to proceed with the EGD 6. Acute on chronic systolic chf- currently on gentle hydration IV Lasix on hold.  7. Essential hypertension: Due to hypotension and lisinopril was discontinued continue metoprolol if able to tolerate due to A. fib with RVR 6. Diabetes mellitus type 2: Continue sliding scale insulin 7. COPD: Stable. Continue inhalers per home regimen 7. Hyper Lipidemia: Continue statin therapy 8. DVT prophylaxis: SCDs 9. GI prophylaxis: IV Protonix due to GI bleed     Code Status Orders        Start     Ordered   11/05/15 0638  Full code   Continuous     11/05/15 0638           Consults gi, cards  DVT Prophylaxis   SCDs    Lab Results  Component Value Date   PLT 335 11/07/2015     Time Spent in minutes 35 minutes of critical care time spent  Dustin Flock M.D on 11/07/2015 at 1:51 PM  Between 7am to 6pm - Pager - [phone]  After 6pm go to [email or web address] - password [email or web address] [place] [facility]   Office  [phone]

## 2015-11-07 NOTE — Progress Notes (Signed)
Pt has been transferred to CCU room 11. Report given to Alroy Dust, Therapist, sports.

## 2015-11-07 NOTE — Sedation Documentation (Signed)
Patient denies pain and is resting comfortably.  

## 2015-11-08 LAB — COMPREHENSIVE METABOLIC PANEL
ALT: 17 U/L (ref 17–63)
ALT: 16 U/L — AB (ref 17–63)
ANION GAP: 10 (ref 5–15)
AST: 54 U/L — AB (ref 15–41)
AST: 44 U/L — ABNORMAL HIGH (ref 15–41)
Albumin: 2.8 g/dL — ABNORMAL LOW (ref 3.5–5.0)
Albumin: 2.7 g/dL — ABNORMAL LOW (ref 3.5–5.0)
Alkaline Phosphatase: 53 U/L (ref 38–126)
Alkaline Phosphatase: 52 U/L (ref 38–126)
Anion gap: 11 (ref 5–15)
BUN: 53 mg/dL — AB (ref 6–20)
BUN: 59 mg/dL — ABNORMAL HIGH (ref 6–20)
CHLORIDE: 101 mmol/L (ref 101–111)
CHLORIDE: 101 mmol/L (ref 101–111)
CO2: 26 mmol/L (ref 22–32)
CO2: 25 mmol/L (ref 22–32)
Calcium: 8.6 mg/dL — ABNORMAL LOW (ref 8.9–10.3)
Calcium: 8.6 mg/dL — ABNORMAL LOW (ref 8.9–10.3)
Creatinine, Ser: 2.99 mg/dL — ABNORMAL HIGH (ref 0.61–1.24)
Creatinine, Ser: 3.31 mg/dL — ABNORMAL HIGH (ref 0.61–1.24)
GFR calc Af Amer: 20 mL/min — ABNORMAL LOW (ref >60)
GFR calc non Af Amer: 16 mL/min — ABNORMAL LOW (ref >60)
GFR, EST AFRICAN AMERICAN: 18 mL/min — AB (ref >60)
GFR, EST NON AFRICAN AMERICAN: 18 mL/min — AB (ref >60)
Glucose, Bld: 187 mg/dL — ABNORMAL HIGH (ref 65–99)
Glucose, Bld: 177 mg/dL — ABNORMAL HIGH (ref 65–99)
POTASSIUM: 4.3 mmol/L (ref 3.5–5.1)
Potassium: 4.1 mmol/L (ref 3.5–5.1)
SODIUM: 138 mmol/L (ref 135–145)
SODIUM: 136 mmol/L (ref 135–145)
Total Bilirubin: 0.5 mg/dL (ref 0.3–1.2)
Total Bilirubin: 0.6 mg/dL (ref 0.3–1.2)
Total Protein: 6.1 g/dL — ABNORMAL LOW (ref 6.5–8.1)
Total Protein: 6.4 g/dL — ABNORMAL LOW (ref 6.5–8.1)

## 2015-11-08 LAB — CBC
HCT: 25.3 % — ABNORMAL LOW (ref 40.0–52.0)
Hemoglobin: 8.1 g/dL — ABNORMAL LOW (ref 13.0–18.0)
MCH: 26 pg (ref 26.0–34.0)
MCHC: 31.9 g/dL — ABNORMAL LOW (ref 32.0–36.0)
MCV: 81.7 fL (ref 80.0–100.0)
PLATELETS: 313 10*3/uL (ref 150–440)
RBC: 3.1 MIL/uL — ABNORMAL LOW (ref 4.40–5.90)
RDW: 16.2 % — AB (ref 11.5–14.5)
WBC: 15.9 10*3/uL — AB (ref 3.8–10.6)

## 2015-11-08 LAB — GLUCOSE, CAPILLARY
Glucose-Capillary: 161 mg/dL — ABNORMAL HIGH (ref 65–99)
Glucose-Capillary: 162 mg/dL — ABNORMAL HIGH (ref 65–99)
Glucose-Capillary: 164 mg/dL — ABNORMAL HIGH (ref 65–99)
Glucose-Capillary: 162 mg/dL — ABNORMAL HIGH (ref 65–99)
Glucose-Capillary: 152 mg/dL — ABNORMAL HIGH (ref 65–99)
Glucose-Capillary: 172 mg/dL — ABNORMAL HIGH (ref 65–99)

## 2015-11-08 MED ORDER — AMPICILLIN-SULBACTAM SODIUM 3 (2-1) G IJ SOLR
3.0000 g | Freq: Two times a day (BID) | INTRAMUSCULAR | Status: DC
  Administered 2015-11-08 – 2015-11-11 (×5): 3 g via INTRAVENOUS
  Filled 2015-11-08 (×8): qty 3

## 2015-11-08 MED ORDER — AMIODARONE HCL 200 MG PO TABS
200.0000 mg | ORAL_TABLET | Freq: Two times a day (BID) | ORAL | Status: DC
  Administered 2015-11-08 – 2015-11-09 (×3): 200 mg via ORAL
  Filled 2015-11-08 (×3): qty 1

## 2015-11-08 NOTE — Progress Notes (Signed)
ANTIBIOTIC CONSULT NOTE - INITIAL  Pharmacy Consult for ampicillin/sulbactam Indication: aspiration pneumonia  No Known Allergies  Patient Measurements: Height: 6\' 3"  (190.5 cm) Weight: 238 lb 1.6 oz (108 kg) IBW/kg (Calculated) : 84.5  Vital Signs: Temp: 98 F (36.7 C) (12/10 0030) Temp Source: Oral (12/10 0030) BP: 108/52 mmHg (12/10 0600) Pulse Rate: 59 (12/10 0600) Intake/Output from previous day: 12/09 0701 - 12/10 0700 In: 2206.6 [I.V.:1806.6; IV Piggyback:400] Out: 470 [Urine:350; Drains:120]  Recent Labs  11/06/15 0409 11/07/15 0701 11/08/15 0515  WBC 21.3* 26.3* 15.9*  HGB 8.9* 8.7* 8.1*  PLT 340 335 313  CREATININE 1.05 2.34* 2.99*   Estimated Creatinine Clearance: 23.6 mL/min (by C-G formula based on Cr of 2.99).  Medications:  Anti-infectives    Start     Dose/Rate Route Frequency Ordered Stop   11/08/15 1923  Ampicillin-Sulbactam (UNASYN) 3 g in sodium chloride 0.9 % 100 mL IVPB     3 g 100 mL/hr over 60 Minutes Intravenous Every 12 hours 11/08/15 0954     11/06/15 1300  Ampicillin-Sulbactam (UNASYN) 3 g in sodium chloride 0.9 % 100 mL IVPB  Status:  Discontinued     3 g 100 mL/hr over 60 Minutes Intravenous Every 6 hours 11/06/15 1203 11/08/15 0954     Assessment: Pharmacy consulted to dose ampicillin/sulbactam for aspiration pneumonia in this 81 year old male admitted with a GIB.   12/10: Scr: 2.99 ( up from 2.34) CrCl: 24 ml/min  Plan:  Patient has been receiving ampicillin/sulbactam 3g IV q 6 hours. Due to increase in Scr and CrCl <25ml/ml will extend dosing interval to every 12 hours.  Pharmacy will continue to monitor and adjust dose if needed.   Nancy Fetter, PharmD Pharmacy Resident  11/08/2015,9:55 AM

## 2015-11-08 NOTE — Progress Notes (Signed)
CC: Right upper quadrant pain Subjective: This patient with acute cholecystitis is septic and has had a cholecystostomy tube placed by IR is doing better today his heart rate is down his blood pressure is improved and he is afebrile. He feels better overall but it remains in the ICU.  Objective: Vital signs in last 24 hours: Temp:  [97.8 F (36.6 C)-98 F (36.7 C)] 98 F (36.7 C) (12/10 0030) Pulse Rate:  [32-157] 80 (12/10 1600) Resp:  [16-44] 36 (12/10 1600) BP: (72-136)/(50-117) 131/64 mmHg (12/10 1600) SpO2:  [86 %-100 %] 95 % (12/10 1600) Weight:  [238 lb 1.6 oz (108 kg)] 238 lb 1.6 oz (108 kg) (12/10 0444) Last BM Date: 11/07/15  Intake/Output from previous day: 12/09 0701 - 12/10 0700 In: 2206.6 [I.V.:1806.6; IV Piggyback:400] Out: 470 [Urine:350; Drains:120] Intake/Output this shift: Total I/O In: 325 [I.V.:325] Out: 80 [Drains:80]  Physical exam:  Awake and alert signs reviewed and stable. Abdomen is soft and only minimally tender serous fluid in cholecystostomy tube drain. Calves are nontender.  Lab Results: CBC   Recent Labs  11/07/15 0701 11/08/15 0515  WBC 26.3* 15.9*  HGB 8.7* 8.1*  HCT 27.2* 25.3*  PLT 335 313   BMET  Recent Labs  11/08/15 0515 11/08/15 1257  NA 138 136  K 4.3 4.1  CL 101 101  CO2 26 25  GLUCOSE 187* 177*  BUN 53* 59*  CREATININE 2.99* 3.31*  CALCIUM 8.6* 8.6*   PT/INR  Recent Labs  11/07/15 1159  LABPROT 17.2*  INR 1.39   ABG No results for input(s): PHART, HCO3 in the last 72 hours.  Invalid input(s): PCO2, PO2  Studies/Results: Ct Image Guided Fluid Drain By Catheter  11/07/2015  CLINICAL DATA:  81 year old with acute cholecystitis. EXAM: CT-GUIDED CHOLECYSTOSTOMY TUBE PLACEMENT Physician: Stephan Minister. Anselm Pancoast, MD FLUOROSCOPY TIME:  None MEDICATIONS: 1 mg Versed, 25 mcg fentanyl. A radiology nurse monitored the patient for moderate sedation. ANESTHESIA/SEDATION: Moderate sedation time: 30 minutes PROCEDURE: The  procedure was explained to the patient. The risks and benefits of the procedure were discussed and the patient's questions were addressed. Informed consent was obtained from the patient. The patient was placed supine on the CT scanner. Images through the abdomen were obtained. The right upper abdomen was prepped and draped in sterile fashion. Maximal barrier sterile technique was utilized including mask, sterile gowns, sterile gloves, sterile drape, hand hygiene and skin antiseptic. The skin was anesthetized with 1% lidocaine. 18 gauge needle was directed into the gallbladder fundus with CT guidance. Stiff Amplatz wire was advanced into the gallbladder with CT guidance. The tract was dilated to accommodate a 10.2 [place] multipurpose drain. The drain was advanced into the gallbladder and 100 mL of cloudy brown fluid was aspirated. Catheter was sutured to skin and attached to gravity bag. FINDINGS: The gallbladder is distended with surrounding edema/ fluid and there is gas in the gallbladder fundus. Findings are consistent with acute cholecystitis. 100 mL of cloudy brown fluid was aspirated. Estimated blood loss: Minimal COMPLICATIONS: None IMPRESSION: Successful CT-guided cholecystostomy tube placement. Electronically Signed   By: Markus Daft M.D.   On: 11/07/2015 17:01   US Abdomen Limited Ruq  11/07/2015  CLINICAL DATA:  Right upper quadrant pain. EXAM: US ABDOMEN LIMITED - RIGHT UPPER QUADRANT COMPARISON:  CT 11/06/2015. FINDINGS: Gallbladder: Gallbladder is distended. Sludge is noted throughout the gallbladder. Gallbladder wall is thickened to 7 mm. Small amount of pericholecystic fluid noted. Positive Murphy sign. Findings are consistent with cholecystitis. Common  bile duct: Diameter: 6 mm. Liver: No focal hepatic abnormality identified. Hepatic echotexture normal. Left hepatic lobe not well visualized due to overlying bowel gas. IMPRESSION: Distended gallbladder with sludge. Gallbladder wall thickening. Small  amount of pericholecystic fluid. Positive Murphy sign. Findings consistent with gallbladder sludge with associated cholecystitis. No biliary ductal distention . Electronically Signed   By: Marcello Moores  Register   On: 11/07/2015 10:04    Anti-infectives: Anti-infectives    Start     Dose/Rate Route Frequency Ordered Stop   11/08/15 1923  Ampicillin-Sulbactam (UNASYN) 3 g in sodium chloride 0.9 % 100 mL IVPB     3 g 100 mL/hr over 60 Minutes Intravenous Every 12 hours 11/08/15 0954     11/06/15 1300  Ampicillin-Sulbactam (UNASYN) 3 g in sodium chloride 0.9 % 100 mL IVPB  Status:  Discontinued     3 g 100 mL/hr over 60 Minutes Intravenous Every 6 hours 11/06/15 1203 11/08/15 0954      Assessment/Plan:  Vital signs her white blood cell count are all improved as is his clinical picture. Recommend continued cholecystostomy tube drainage and IV antibiotics and timing of surgery will be discussed in a later date.  Florene Glen, MD, FACS  11/08/2015

## 2015-11-08 NOTE — Progress Notes (Addendum)
[place] [facility]                                                                                                                                                                                            Patient Demographics   Jereth Barnish, is a 81 y.o. male, DOB - 10-01-1927, [id number]  Admit date - 11/05/2015   Admitting Physician Harrie Foreman, MD  Outpatient Primary MD for the patient is Olmedo, Guy Begin, MD   LOS - 3  Subjective: Patient had cholecystostomy tube for cholecystitis. He is doing better today. No nausea. Has minimal abdominal pain. On amiodarone drip, heart rate is 69 BP 140/70. We will try with clear liquids.     Review of Systems:   CONSTITUTIONAL: No documented fever.  Positive fatigue and weakness. No weight gain, no weight loss.  EYES: No blurry or double vision.  ENT: No tinnitus. No postnasal drip. No redness of the oropharynx.  RESPIRATORY: No cough, no wheeze, no hemoptysis. Positive  dyspnea.  CARDIOVASCULAR: No chest pain. No orthopnea. No palpitations. No syncope.  GASTROINTESTINAL: No nausea, no vomiting or diarrhea. Right upper quadrant abdominal pain.  Positive melena or hematochezia.  GENITOURINARY: No dysuria or hematuria.  ENDOCRINE: No polyuria or nocturia. No heat or cold intolerance.  HEMATOLOGY: No anemia. No bruising. No bleeding.  INTEGUMENTARY: No rashes. No lesions.  MUSCULOSKELETAL: No arthritis. No swelling. No gout.  NEUROLOGIC: No numbness, tingling, or ataxia. No seizure-type activity.  PSYCHIATRIC: No anxiety. No insomnia. No ADD.    Vitals:   Filed Vitals:   11/08/15 0444 11/08/15 0500 11/08/15 0600 11/08/15 0634  BP:  99/50 108/52   Pulse:  66 59   Temp:      TempSrc:      Resp:  16 26   Height:      Weight: 108 kg (238 lb 1.6 oz)     SpO2:  94% 100% 87%    Wt Readings from Last 3 Encounters:  11/08/15 108 kg (238 lb 1.6 oz)  09/30/15 110.678 kg (244 lb)   07/29/15 107.049 kg (236 lb)     Intake/Output Summary (Last 24 hours) at 11/08/15 1202 Last data filed at 11/08/15 0600  Gross per 24 hour  Intake 2206.61 ml  Output    470 ml  Net 1736.61 ml    Physical Exam:   GENERAL: Pleasant-appearing in no apparent distress.  HEAD, EYES, EARS, NOSE AND THROAT: Atraumatic, normocephalic. Extraocular muscles are intact. Pupils equal and reactive to light. Sclerae anicteric. No conjunctival injection. No oro-pharyngeal erythema.  NECK: Supple. There is no  jugular venous distention. No bruits, no lymphadenopathy, no thyromegaly.  HEART: Irregularly irregular heart and rhythm,. No murmurs, no rubs, no clicks.  LUNGS: Clear to auscultation bilaterally. No rales or rhonchi. No wheezes.  ABDOMEN: Soft, flat, nontender, nondistended. Has good bowel sounds. No hepatosplenomegaly appreciated.  In Right upper quadrant ,cholecystostomy tube present draining serosanguineous fluid. EXTREMITIES: No evidence of any cyanosis, clubbing, or peripheral edema.  +2 pedal and radial pulses bilaterally.  NEUROLOGIC: The patient is alert, awake, and oriented x3 with no focal motor or sensory deficits appreciated bilaterally.  SKIN: Moist and warm with no rashes appreciated.  Psych: Not anxious, depressed LN: No inguinal LN enlargement    Antibiotics   Anti-infectives    Start     Dose/Rate Route Frequency Ordered Stop   11/08/15 1923  Ampicillin-Sulbactam (UNASYN) 3 g in sodium chloride 0.9 % 100 mL IVPB     3 g 100 mL/hr over 60 Minutes Intravenous Every 12 hours 11/08/15 0954     11/06/15 1300  Ampicillin-Sulbactam (UNASYN) 3 g in sodium chloride 0.9 % 100 mL IVPB  Status:  Discontinued     3 g 100 mL/hr over 60 Minutes Intravenous Every 6 hours 11/06/15 1203 11/08/15 0954      Medications   Scheduled Meds: . ampicillin-sulbactam (UNASYN) IV  3 g Intravenous Q12H  . docusate sodium  100 mg Oral BID  . FLUoxetine  20 mg Oral Daily  . insulin aspart  0-9  Units Subcutaneous TID WC  . metoprolol  50 mg Oral BID  . pantoprazole (PROTONIX) IV  40 mg Intravenous Q12H  . pravastatin  20 mg Oral q1800  . sodium chloride  3 mL Intravenous Q12H  . tiotropium  1 capsule Inhalation Daily   Continuous Infusions: . sodium chloride 50 mL/hr at 11/08/15 0113  . amiodarone 30 mg/hr (11/07/15 2307)   PRN Meds:.acetaminophen **OR** acetaminophen, albuterol, hydrALAZINE, morphine injection, nitroGLYCERIN, ondansetron **OR** ondansetron (ZOFRAN) IV, oxyCODONE-acetaminophen, promethazine   Data Review:   Micro Results Recent Results (from the past 240 hour(s))  Culture, blood (routine x 2)     Status: None (Preliminary result)   Collection Time: 11/06/15 12:35 PM  Result Value Ref Range Status   Specimen Description BLOOD RIGHT ASSIST CONTROL  Final   Special Requests   Final    BOTTLES DRAWN AEROBIC AND ANAEROBIC  [place] ANAERO [place] AERO   Culture NO GROWTH 2 DAYS  Final   Report Status PENDING  Incomplete  Culture, blood (routine x 2)     Status: None (Preliminary result)   Collection Time: 11/06/15 12:38 PM  Result Value Ref Range Status   Specimen Description BLOOD LEFT HAND  Final   Special Requests BOTTLES DRAWN AEROBIC AND ANAEROBIC  3 CC  Final   Culture NO GROWTH 2 DAYS  Final   Report Status PENDING  Incomplete  Body fluid culture     Status: None (Preliminary result)   Collection Time: 11/07/15  3:40 PM  Result Value Ref Range Status   Specimen Description GALL BLADDER  Final   Special Requests NONE  Final   Gram Stain PENDING  Incomplete   Culture   Final    HEAVY GROWTH GRAM NEGATIVE RODS IDENTIFICATION AND SUSCEPTIBILITIES TO FOLLOW    Report Status PENDING  Incomplete    Radiology Reports Dg Chest 1 View  11/06/2015  CLINICAL DATA:  Shortness of breath and weakness. EXAM: CHEST 1 VIEW COMPARISON:  11/05/2015. FINDINGS: Trachea is midline. Heart is at the  upper limits of normal in size to mildly enlarged. Thoracic aorta is  calcified. There is basilar interstitial prominence and indistinctness, new. Small left pleural effusion also new. IMPRESSION: New basilar dependent interstitial prominence/indistinctness and small left pleural effusion, favoring congestive heart failure. Electronically Signed   By: Lorin Picket M.D.   On: 11/06/2015 13:22   Dg Chest 2 View  11/05/2015  CLINICAL DATA:  Acute onset of mid chest pain.  Initial encounter. EXAM: CHEST  2 VIEW COMPARISON:  Chest radiograph performed 07/15/2015 FINDINGS: The lungs are well-aerated. Vascular congestion is noted. Increased interstitial markings raise concern for mild pulmonary edema, though pneumonia could have a similar appearance. There is no evidence of pleural effusion or pneumothorax. The heart is normal in size. The patient is status post median sternotomy, with evidence of prior CABG. No acute osseous abnormalities are seen. IMPRESSION: Vascular congestion noted. Increased interstitial markings raise concern for mild pulmonary edema, though pneumonia could have a similar appearance. Electronically Signed   By: Garald Balding M.D.   On: 11/05/2015 03:55   Ct Abdomen Pelvis W Contrast  11/06/2015  CLINICAL DATA:  Chest pain at rest. Nauseated. Intermittent symptoms for 4 weeks. Nausea and diarrhea over Thanksgiving. Decreased hemoglobin. EXAM: CT ABDOMEN AND PELVIS WITH CONTRAST TECHNIQUE: Multidetector CT imaging of the abdomen and pelvis was performed using the standard protocol following bolus administration of intravenous contrast. CONTRAST:  132mL OMNIPAQUE IOHEXOL 300 MG/ML  SOLN COMPARISON:  None FINDINGS: Lower chest: There is left lower lobe atelectasis. Coronary artery calcifications are present. Heart is mildly enlarged. No pericardial effusion. Upper abdomen: New no focal abnormality identified within the liver, spleen, pancreas, or right adrenal gland. Left adrenal adenoma measured/ 1.4 x 2.0 cm. There is symmetric enhancement of both kidneys.  Small left lower pole cyst is identified. No hydronephrosis. The gallbladder is distended and has a thickened wall. There is significant pericholecystic inflammatory change. Small amount of gas is identified in the nondependent gallbladder fundus, suspicious for gas-forming organisms. Gastrointestinal tract: The stomach and small bowel loops are normal in appearance. The appendix is normal in appearance. Numerous colonic diverticula are present. There is large stool burden, particularly within the rectosigmoid colon. Pelvis: Large prostate with prominent impression upon the bladder. Urinary bladder is normal in appearance. No free pelvic fluid. Retroperitoneum: Atherosclerotic calcification of the abdominal aorta and branches. No aneurysm. Abdominal wall: Small fat containing supraumbilical hernia Osseous structures: Significant lumbar spondylosis. No suspicious lytic or blastic lesions are identified. IMPRESSION: 1. Distended, thick-walled gallbladder with significant pericholecystic stranding. Gas within the gallbladder wall is suspicious for infection. Further evaluation with ultrasound is recommended. 2. Coronary artery disease. 3. Small left renal cyst. 4. Colonic diverticulosis.  Moderate stool burden. 5. Enlarged prostate. 6. Small fat containing supraumbilical hernia. Electronically Signed   By: Nolon Nations M.D.   On: 11/06/2015 14:05   Ct Image Guided Fluid Drain By Catheter  11/07/2015  CLINICAL DATA:  81 year old with acute cholecystitis. EXAM: CT-GUIDED CHOLECYSTOSTOMY TUBE PLACEMENT Physician: Stephan Minister. Anselm Pancoast, MD FLUOROSCOPY TIME:  None MEDICATIONS: 1 mg Versed, 25 mcg fentanyl. A radiology nurse monitored the patient for moderate sedation. ANESTHESIA/SEDATION: Moderate sedation time: 30 minutes PROCEDURE: The procedure was explained to the patient. The risks and benefits of the procedure were discussed and the patient's questions were addressed. Informed consent was obtained from the patient. The  patient was placed supine on the CT scanner. Images through the abdomen were obtained. The right upper abdomen was prepped and draped in sterile  fashion. Maximal barrier sterile technique was utilized including mask, sterile gowns, sterile gloves, sterile drape, hand hygiene and skin antiseptic. The skin was anesthetized with 1% lidocaine. 18 gauge needle was directed into the gallbladder fundus with CT guidance. Stiff Amplatz wire was advanced into the gallbladder with CT guidance. The tract was dilated to accommodate a 10.2 [place] multipurpose drain. The drain was advanced into the gallbladder and 100 mL of cloudy brown fluid was aspirated. Catheter was sutured to skin and attached to gravity bag. FINDINGS: The gallbladder is distended with surrounding edema/ fluid and there is gas in the gallbladder fundus. Findings are consistent with acute cholecystitis. 100 mL of cloudy brown fluid was aspirated. Estimated blood loss: Minimal COMPLICATIONS: None IMPRESSION: Successful CT-guided cholecystostomy tube placement. Electronically Signed   By: Markus Daft M.D.   On: 11/07/2015 17:01   US Abdomen Limited Ruq  11/07/2015  CLINICAL DATA:  Right upper quadrant pain. EXAM: US ABDOMEN LIMITED - RIGHT UPPER QUADRANT COMPARISON:  CT 11/06/2015. FINDINGS: Gallbladder: Gallbladder is distended. Sludge is noted throughout the gallbladder. Gallbladder wall is thickened to 7 mm. Small amount of pericholecystic fluid noted. Positive Murphy sign. Findings are consistent with cholecystitis. Common bile duct: Diameter: 6 mm. Liver: No focal hepatic abnormality identified. Hepatic echotexture normal. Left hepatic lobe not well visualized due to overlying bowel gas. IMPRESSION: Distended gallbladder with sludge. Gallbladder wall thickening. Small amount of pericholecystic fluid. Positive Murphy sign. Findings consistent with gallbladder sludge with associated cholecystitis. No biliary ductal distention . Electronically Signed   By:  Marcello Moores  Register   On: 11/07/2015 10:04     CBC  Recent Labs Lab 11/05/15 0319 11/05/15 1929 11/06/15 0409 11/07/15 0701 11/08/15 0515  WBC 9.4  --  21.3* 26.3* 15.9*  HGB 7.1* 8.8* 8.9* 8.7* 8.1*  HCT 22.8*  --  28.3* 27.2* 25.3*  PLT 362  --  340 335 313  MCV 79.9*  --  80.4 81.7 81.7  MCH 24.8*  --  25.3* 26.0 26.0  MCHC 31.0*  --  31.4* 31.8* 31.9*  RDW 15.3*  --  15.9* 16.4* 16.2*    Chemistries   Recent Labs Lab 11/05/15 0319 11/06/15 0409 11/06/15 1235 11/07/15 0701 11/08/15 0515  NA 139 138  --  137 138  K 3.8 3.7  --  4.4 4.3  CL 106 102  --  103 101  CO2 27 29  --  25 26  GLUCOSE 180* 254*  --  148* 187*  BUN 22* 20  --  40* 53*  CREATININE 1.10 1.05  --  2.34* 2.99*  CALCIUM 9.8 10.0  --  9.1 8.6*  AST  --   --  11*  --  54*  ALT  --   --  10*  --  17  ALKPHOS  --   --  51  --  53  BILITOT  --   --  1.2  --  0.5   ------------------------------------------------------------------------------------------------------------------ estimated creatinine clearance is 23.6 mL/min (by C-G formula based on Cr of 2.99). ------------------------------------------------------------------------------------------------------------------ No results for input(s): HGBA1C in the last 72 hours. ------------------------------------------------------------------------------------------------------------------ No results for input(s): CHOL, HDL, LDLCALC, TRIG, CHOLHDL, LDLDIRECT in the last 72 hours. ------------------------------------------------------------------------------------------------------------------ No results for input(s): TSH, T4TOTAL, T3FREE, THYROIDAB in the last 72 hours.  Invalid input(s): FREET3 ------------------------------------------------------------------------------------------------------------------ No results for input(s): VITAMINB12, FOLATE, FERRITIN, TIBC, IRON, RETICCTPCT in the last 72 hours.  Coagulation profile  Recent Labs Lab  11/07/15 1159  INR 1.39    No  results for input(s): DDIMER in the last 72 hours.  Cardiac Enzymes  Recent Labs Lab 11/06/15 0409 11/06/15 1010 11/06/15 1547  TROPONINI 0.08* 0.05* 0.06*   ------------------------------------------------------------------------------------------------------------------ Invalid input(s): POCBNP    Assessment & Plan    This is an 81 year old Caucasian male with past medical history significant for coronary artery disease status post CABG and PCI now has a GI bleed and symptomatic anemia. 1. Hypotension: Due to sepsis likely gallbladder source. It is post ultrasound-guided cholecystotomy tube placed. Continue Unasyn. Improving. Continue liquids today. 2. Atrial fibrillation with rapid ventricular rate associated with hypotension; continue amiodarone drip. BP is acceptable.  3. GI bleed: due to melena likely upper protonix bid, seen by gi they would like cardiac clearance  And hold brilinta. GAD on Monday. Continue IV PPIs. 4. Nausea vomiting: Due to acute cholecystitis continue Unasyn and follow CBC 5. Chest pain: Likely anginal secondary to anemia. Troponin is negative.  Cards consult appreciated they feel that this is not an MI. Recommend to proceed with the EGD 6. Acute on chronic systolic chf- currently on gentle hydration IV Lasix on hold.  7. Essential hypertension: Due to hypotension and lisinopril was discontinued continue metoprolol if able to tolerate due to A. fib with RVR 6. Diabetes mellitus type 2: Continue sliding scale insulin 7. COPD: Stable. Continue inhalers per home regimen 7. Hyper Lipidemia: Continue statin therapy 8. DVT prophylaxis: SCDs 9. GI prophylaxis: IV Protonix due to GI bleed Plan to start clear liquids today and resume metoprolol and amiodarone by mouth as tolerated. #10 acute on chronic anemia status post transfusion, hemoglobin stable. Your anemia is due to GI bleed.  #11 acute renal failure due to ATN with GI  bleed, sepsis/hypotension; continue hydration and monitor renal function closely renal function is worse today. Hold her Lasix, metformin, lisinopril. Discussed with the family.    Code Status Orders        Start     Ordered   11/05/15 [id number]  Full code   Continuous     11/05/15 0638           Consults gi, cards  DVT Prophylaxis   SCDs    Lab Results  Component Value Date   PLT 313 11/08/2015     Time Spent in minutes 35 minutes of critical care time spent  [facility] M.D on 11/08/2015 at 12:02 PM  Between 7am to 6pm - Pager - [phone]  After 6pm go to [email or web address] - password [email or web address] [facility]   Office  [phone]

## 2015-11-09 ENCOUNTER — Inpatient Hospital Stay: Payer: Medicare Other

## 2015-11-09 LAB — GLUCOSE, CAPILLARY
Glucose-Capillary: 165 mg/dL — ABNORMAL HIGH (ref 65–99)
Glucose-Capillary: 174 mg/dL — ABNORMAL HIGH (ref 65–99)
Glucose-Capillary: 158 mg/dL — ABNORMAL HIGH (ref 65–99)
Glucose-Capillary: 180 mg/dL — ABNORMAL HIGH (ref 65–99)
Glucose-Capillary: 182 mg/dL — ABNORMAL HIGH (ref 65–99)
Glucose-Capillary: 191 mg/dL — ABNORMAL HIGH (ref 65–99)
Glucose-Capillary: 178 mg/dL — ABNORMAL HIGH (ref 65–99)

## 2015-11-09 LAB — CBC
HEMATOCRIT: 23.3 % — AB (ref 40.0–52.0)
HEMOGLOBIN: 7.5 g/dL — AB (ref 13.0–18.0)
MCH: 26.1 pg (ref 26.0–34.0)
MCHC: 32.1 g/dL (ref 32.0–36.0)
MCV: 81.2 fL (ref 80.0–100.0)
PLATELETS: 274 10*3/uL (ref 150–440)
RBC: 2.87 MIL/uL — AB (ref 4.40–5.90)
RDW: 16.6 % — ABNORMAL HIGH (ref 11.5–14.5)
WBC: 12.4 10*3/uL — AB (ref 3.8–10.6)

## 2015-11-09 LAB — MRSA PCR SCREENING: MRSA by PCR: NEGATIVE

## 2015-11-09 MED ORDER — TAMSULOSIN HCL 0.4 MG PO CAPS
0.4000 mg | ORAL_CAPSULE | Freq: Every day | ORAL | Status: DC
  Administered 2015-11-09 – 2015-11-13 (×5): 0.4 mg via ORAL
  Filled 2015-11-09 (×5): qty 1

## 2015-11-09 MED ORDER — INSULIN ASPART 100 UNIT/ML ~~LOC~~ SOLN
0.0000 [IU] | Freq: Four times a day (QID) | SUBCUTANEOUS | Status: DC
  Administered 2015-11-10 (×3): 1 [IU] via SUBCUTANEOUS
  Administered 2015-11-10: 2 [IU] via SUBCUTANEOUS
  Filled 2015-11-09: qty 1
  Filled 2015-11-09: qty 2
  Filled 2015-11-09 (×2): qty 1

## 2015-11-09 NOTE — Progress Notes (Signed)
11/09/2015 11:02  Spoke to [place] in CCU.  Pt's rhythm changed from NSR to Afib, with a rate of 58.  Pt was in Afib a few days ago and converted back to NSR.  Will notify MD and continue to monitor and assess.  Dola Argyle, RN

## 2015-11-09 NOTE — Consult Note (Signed)
  GI Inpatient Follow-up Note  Patient Identification: PARKS MATTALIANO is a 81 y.o. male with possible UGIB on Brillinta  Subjective:   Some abd distension toay. Not making much urine. Creat rising. . One dark stool before surgery. None since. Denies abd pain, n/v, c/p.   Scheduled Inpatient Medications:  . ampicillin-sulbactam (UNASYN) IV  3 g Intravenous Q12H  . docusate sodium  100 mg Oral BID  . FLUoxetine  20 mg Oral Daily  . [START ON 11/10/2015] insulin aspart  0-9 Units Subcutaneous Q6H  . metoprolol  50 mg Oral BID  . pantoprazole (PROTONIX) IV  40 mg Intravenous Q12H  . pravastatin  20 mg Oral q1800  . sodium chloride  3 mL Intravenous Q12H  . tamsulosin  0.4 mg Oral QPC supper  . tiotropium  1 capsule Inhalation Daily    Continuous Inpatient Infusions:   . sodium chloride 100 mL/hr at 11/09/15 1521    PRN Inpatient Medications:  acetaminophen **OR** acetaminophen, albuterol, hydrALAZINE, morphine injection, nitroGLYCERIN, ondansetron **OR** ondansetron (ZOFRAN) IV, oxyCODONE-acetaminophen, promethazine  Review of Systems: Constitutional: Weight is stable.  Eyes: No changes in vision. ENT: No oral lesions, sore throat.  GI: see HPI.  Heme/Lymph: No easy bruising.  CV: No chest pain.  GU: No hematuria. + UOP decreased to minimal Integumentary: No rashes.  Neuro: No headaches.  Psych: No depression/anxiety.  Endocrine: No heat/cold intolerance.  Allergic/Immunologic: No urticaria.  Resp: No cough, SOB.  Musculoskeletal: No joint swelling.    Physical Examination: BP 121/56 mmHg  Pulse 54  Temp(Src) 98.2 F (36.8 C) (Oral)  Resp 16  Ht 6\' 3"  (1.905 m)  Wt 111.6 kg (246 lb 0.5 oz)  BMI 30.75 kg/m2  SpO2 93% Gen: NAD, alert and oriented x 4 HEENT: PEERLA, EOMI, Neck: supple, no JVD or thyromegaly Chest: CTA bilaterally, no wheezes, crackles, or other adventitious sounds CV: RRR, no m/g/c/r Abd: soft, mild RUQ tenderness, ND, +BS in all four  quadrants; no HSM, guarding, ridigity, or rebound tenderness, + surg wounds c/d/i Ext: no edema, well perfused with 2+ pulses, Skin: no rash or lesions noted Lymph: no LAD  Data: Lab Results  Component Value Date   WBC 12.4* 11/09/2015   HGB 7.5* 11/09/2015   HCT 23.3* 11/09/2015   MCV 81.2 11/09/2015   PLT 274 11/09/2015    Recent Labs Lab 11/07/15 0701 11/08/15 0515 11/09/15 0501  HGB 8.7* 8.1* 7.5*   Lab Results  Component Value Date   NA 136 11/08/2015   K 4.1 11/08/2015   CL 101 11/08/2015   CO2 25 11/08/2015   BUN 59* 11/08/2015   CREATININE 3.31* 11/08/2015   Lab Results  Component Value Date   ALT 16* 11/08/2015   AST 44* 11/08/2015   ALKPHOS 52 11/08/2015   BILITOT 0.6 11/08/2015    Recent Labs Lab 11/07/15 1159  INR 1.39   Assessment/Plan: Mr. Nordhoff is a 81 y.o. male with rapid Afib. UGI bleeding, which has stopped off brilinta. No further bleeding but Hgb trended down again today. Worsening CKD today.   Recommendations: - cont PPI - hold Brillinta - possible EGD on MOnday with Dr Candace Cruise - npo after mn   Please call with questions or concerns.  Lew Prout, Grace Blight, MD

## 2015-11-09 NOTE — Progress Notes (Signed)
[facility]                                                                                                                                                                                            Patient Demographics   Justin Leonard, is a 81 y.o. male, DOB - 10-May-1927, [id number]  Admit date - 11/05/2015   Admitting Physician Harrie Foreman, MD  Outpatient Primary MD for the patient is Olmedo, Guy Begin, MD   LOS - 4  Subjective: Patient had cholecystostomy tube for cholecystitis. He is doing better today. No nausea. Has minimal abdominal pain.off amiodrip. No fever. Hemodynamically stable. Tolerating the diet.  No Further melena.    Review of Systems:   CONSTITUTIONAL: No documented fever.  Positive fatigue and weakness. No weight gain, no weight loss.  EYES: No blurry or double vision.  ENT: No tinnitus. No postnasal drip. No redness of the oropharynx.  RESPIRATORY: No cough, no wheeze, no hemoptysis. Positive  dyspnea.  CARDIOVASCULAR: No chest pain. No orthopnea. No palpitations. No syncope.  GASTROINTESTINAL: No nausea, no vomiting or diarrhea. Right upper quadrant abdominal pain.  GENITOURINARY: No dysuria or hematuria.  ENDOCRINE: No polyuria or nocturia. No heat or cold intolerance.  HEMATOLOGY: No anemia. No bruising. No bleeding.  INTEGUMENTARY: No rashes. No lesions.  MUSCULOSKELETAL: No arthritis. No swelling. No gout.  NEUROLOGIC: No numbness, tingling, or ataxia. No seizure-type activity.  PSYCHIATRIC: No anxiety. No insomnia. No ADD.    Vitals:   Filed Vitals:   11/09/15 0500 11/09/15 0600 11/09/15 0700 11/09/15 0800  BP: 110/54 107/54 124/60 121/53  Pulse: 54 53 53 57  Temp:      TempSrc:      Resp: 18 17 21 17   Height:      Weight:      SpO2: 94% 99% 100% 100%    Wt Readings from Last 3 Encounters:  11/09/15 111.6 kg (246 lb 0.5 oz)  09/30/15 110.678 kg (244 lb)  07/29/15 107.049 kg (236  lb)     Intake/Output Summary (Last 24 hours) at 11/09/15 1053 Last data filed at 11/09/15 0916  Gross per 24 hour  Intake   2700 ml  Output   1000 ml  Net   1700 ml    Physical Exam:   GENERAL: Pleasant-appearing in no apparent distress.  HEAD, EYES, EARS, NOSE AND THROAT: Atraumatic, normocephalic. Extraocular muscles are intact. Pupils equal and reactive to light. Sclerae anicteric. No conjunctival injection. No oro-pharyngeal erythema.  NECK: Supple. There is no jugular venous distention. No bruits, no lymphadenopathy, no thyromegaly.  HEART:  Irregularly irregular heart and rhythm,. No murmurs, no rubs, no clicks.  LUNGS: Clear to auscultation bilaterally. No rales or rhonchi. No wheezes.  ABDOMEN: Soft, flat, nontender, nondistended. Has good bowel sounds. No hepatosplenomegaly appreciated.  In Right upper quadrant ,cholecystostomy tube present draining serosanguineous fluid. EXTREMITIES: No evidence of any cyanosis, clubbing, or peripheral edema.  +2 pedal and radial pulses bilaterally.  NEUROLOGIC: The patient is alert, awake, and oriented x3 with no focal motor or sensory deficits appreciated bilaterally.  SKIN: Moist and warm with no rashes appreciated.  Psych: Not anxious, depressed LN: No inguinal LN enlargement    Antibiotics   Anti-infectives    Start     Dose/Rate Route Frequency Ordered Stop   11/08/15 1923  Ampicillin-Sulbactam (UNASYN) 3 g in sodium chloride 0.9 % 100 mL IVPB     3 g 100 mL/hr over 60 Minutes Intravenous Every 12 hours 11/08/15 0954     11/06/15 1300  Ampicillin-Sulbactam (UNASYN) 3 g in sodium chloride 0.9 % 100 mL IVPB  Status:  Discontinued     3 g 100 mL/hr over 60 Minutes Intravenous Every 6 hours 11/06/15 1203 11/08/15 0954      Medications   Scheduled Meds: . ampicillin-sulbactam (UNASYN) IV  3 g Intravenous Q12H  . docusate sodium  100 mg Oral BID  . FLUoxetine  20 mg Oral Daily  . insulin aspart  0-9 Units Subcutaneous TID WC   . metoprolol  50 mg Oral BID  . pantoprazole (PROTONIX) IV  40 mg Intravenous Q12H  . pravastatin  20 mg Oral q1800  . sodium chloride  3 mL Intravenous Q12H  . tiotropium  1 capsule Inhalation Daily   Continuous Infusions: . sodium chloride 100 mL/hr at 11/09/15 0400   PRN Meds:.acetaminophen **OR** acetaminophen, albuterol, hydrALAZINE, morphine injection, nitroGLYCERIN, ondansetron **OR** ondansetron (ZOFRAN) IV, oxyCODONE-acetaminophen, promethazine   Data Review:   Micro Results Recent Results (from the past 240 hour(s))  Culture, blood (routine x 2)     Status: None (Preliminary result)   Collection Time: 11/06/15 12:35 PM  Result Value Ref Range Status   Specimen Description BLOOD RIGHT ASSIST CONTROL  Final   Special Requests   Final    BOTTLES DRAWN AEROBIC AND ANAEROBIC  [place] ANAERO [place] AERO   Culture NO GROWTH 3 DAYS  Final   Report Status PENDING  Incomplete  Culture, blood (routine x 2)     Status: None (Preliminary result)   Collection Time: 11/06/15 12:38 PM  Result Value Ref Range Status   Specimen Description BLOOD LEFT HAND  Final   Special Requests BOTTLES DRAWN AEROBIC AND ANAEROBIC  3 CC  Final   Culture NO GROWTH 3 DAYS  Final   Report Status PENDING  Incomplete  Body fluid culture     Status: None (Preliminary result)   Collection Time: 11/07/15  3:40 PM  Result Value Ref Range Status   Specimen Description GALL BLADDER  Final   Special Requests NONE  Final   Gram Stain   Final    RARE WBC SEEN MANY GRAM POSITIVE RODS MANY GRAM NEGATIVE RODS    Culture HEAVY GROWTH ESCHERICHIA COLI  Final   Report Status PENDING  Incomplete   Organism ID, Bacteria ESCHERICHIA COLI  Final      Susceptibility   Escherichia coli - MIC*    AMPICILLIN <=2 SENSITIVE Sensitive     CEFTAZIDIME <=1 SENSITIVE Sensitive     CEFAZOLIN <=4 SENSITIVE Sensitive     CEFTRIAXONE <=  1 SENSITIVE Sensitive     CIPROFLOXACIN <=0.25 SENSITIVE Sensitive     GENTAMICIN <=1 SENSITIVE  Sensitive     IMIPENEM <=0.25 SENSITIVE Sensitive     TRIMETH/SULFA <=20 SENSITIVE Sensitive     PIP/TAZO Value in next row Sensitive      SENSITIVE<=4    * HEAVY GROWTH ESCHERICHIA COLI    Radiology Reports Dg Chest 1 View  11/06/2015  CLINICAL DATA:  Shortness of breath and weakness. EXAM: CHEST 1 VIEW COMPARISON:  11/05/2015. FINDINGS: Trachea is midline. Heart is at the upper limits of normal in size to mildly enlarged. Thoracic aorta is calcified. There is basilar interstitial prominence and indistinctness, new. Small left pleural effusion also new. IMPRESSION: New basilar dependent interstitial prominence/indistinctness and small left pleural effusion, favoring congestive heart failure. Electronically Signed   By: Lorin Picket M.D.   On: 11/06/2015 13:22   Dg Chest 2 View  11/05/2015  CLINICAL DATA:  Acute onset of mid chest pain.  Initial encounter. EXAM: CHEST  2 VIEW COMPARISON:  Chest radiograph performed 07/15/2015 FINDINGS: The lungs are well-aerated. Vascular congestion is noted. Increased interstitial markings raise concern for mild pulmonary edema, though pneumonia could have a similar appearance. There is no evidence of pleural effusion or pneumothorax. The heart is normal in size. The patient is status post median sternotomy, with evidence of prior CABG. No acute osseous abnormalities are seen. IMPRESSION: Vascular congestion noted. Increased interstitial markings raise concern for mild pulmonary edema, though pneumonia could have a similar appearance. Electronically Signed   By: Garald Balding M.D.   On: 11/05/2015 03:55   Ct Abdomen Pelvis W Contrast  11/06/2015  CLINICAL DATA:  Chest pain at rest. Nauseated. Intermittent symptoms for 4 weeks. Nausea and diarrhea over Thanksgiving. Decreased hemoglobin. EXAM: CT ABDOMEN AND PELVIS WITH CONTRAST TECHNIQUE: Multidetector CT imaging of the abdomen and pelvis was performed using the standard protocol following bolus administration  of intravenous contrast. CONTRAST:  158mL OMNIPAQUE IOHEXOL 300 MG/ML  SOLN COMPARISON:  None FINDINGS: Lower chest: There is left lower lobe atelectasis. Coronary artery calcifications are present. Heart is mildly enlarged. No pericardial effusion. Upper abdomen: New no focal abnormality identified within the liver, spleen, pancreas, or right adrenal gland. Left adrenal adenoma measured/ 1.4 x 2.0 cm. There is symmetric enhancement of both kidneys. Small left lower pole cyst is identified. No hydronephrosis. The gallbladder is distended and has a thickened wall. There is significant pericholecystic inflammatory change. Small amount of gas is identified in the nondependent gallbladder fundus, suspicious for gas-forming organisms. Gastrointestinal tract: The stomach and small bowel loops are normal in appearance. The appendix is normal in appearance. Numerous colonic diverticula are present. There is large stool burden, particularly within the rectosigmoid colon. Pelvis: Large prostate with prominent impression upon the bladder. Urinary bladder is normal in appearance. No free pelvic fluid. Retroperitoneum: Atherosclerotic calcification of the abdominal aorta and branches. No aneurysm. Abdominal wall: Small fat containing supraumbilical hernia Osseous structures: Significant lumbar spondylosis. No suspicious lytic or blastic lesions are identified. IMPRESSION: 1. Distended, thick-walled gallbladder with significant pericholecystic stranding. Gas within the gallbladder wall is suspicious for infection. Further evaluation with ultrasound is recommended. 2. Coronary artery disease. 3. Small left renal cyst. 4. Colonic diverticulosis.  Moderate stool burden. 5. Enlarged prostate. 6. Small fat containing supraumbilical hernia. Electronically Signed   By: Nolon Nations M.D.   On: 11/06/2015 14:05   Ct Image Guided Fluid Drain By Catheter  11/07/2015  CLINICAL DATA:  81 year old with  acute cholecystitis. EXAM:  CT-GUIDED CHOLECYSTOSTOMY TUBE PLACEMENT Physician: Stephan Minister. Anselm Pancoast, MD FLUOROSCOPY TIME:  None MEDICATIONS: 1 mg Versed, 25 mcg fentanyl. A radiology nurse monitored the patient for moderate sedation. ANESTHESIA/SEDATION: Moderate sedation time: 30 minutes PROCEDURE: The procedure was explained to the patient. The risks and benefits of the procedure were discussed and the patient's questions were addressed. Informed consent was obtained from the patient. The patient was placed supine on the CT scanner. Images through the abdomen were obtained. The right upper abdomen was prepped and draped in sterile fashion. Maximal barrier sterile technique was utilized including mask, sterile gowns, sterile gloves, sterile drape, hand hygiene and skin antiseptic. The skin was anesthetized with 1% lidocaine. 18 gauge needle was directed into the gallbladder fundus with CT guidance. Stiff Amplatz wire was advanced into the gallbladder with CT guidance. The tract was dilated to accommodate a 10.2 [place] multipurpose drain. The drain was advanced into the gallbladder and 100 mL of cloudy brown fluid was aspirated. Catheter was sutured to skin and attached to gravity bag. FINDINGS: The gallbladder is distended with surrounding edema/ fluid and there is gas in the gallbladder fundus. Findings are consistent with acute cholecystitis. 100 mL of cloudy brown fluid was aspirated. Estimated blood loss: Minimal COMPLICATIONS: None IMPRESSION: Successful CT-guided cholecystostomy tube placement. Electronically Signed   By: Markus Daft M.D.   On: 11/07/2015 17:01   US Abdomen Limited Ruq  11/07/2015  CLINICAL DATA:  Right upper quadrant pain. EXAM: US ABDOMEN LIMITED - RIGHT UPPER QUADRANT COMPARISON:  CT 11/06/2015. FINDINGS: Gallbladder: Gallbladder is distended. Sludge is noted throughout the gallbladder. Gallbladder wall is thickened to 7 mm. Small amount of pericholecystic fluid noted. Positive Murphy sign. Findings are consistent with  cholecystitis. Common bile duct: Diameter: 6 mm. Liver: No focal hepatic abnormality identified. Hepatic echotexture normal. Left hepatic lobe not well visualized due to overlying bowel gas. IMPRESSION: Distended gallbladder with sludge. Gallbladder wall thickening. Small amount of pericholecystic fluid. Positive Murphy sign. Findings consistent with gallbladder sludge with associated cholecystitis. No biliary ductal distention . Electronically Signed   By: Marcello Moores  Register   On: 11/07/2015 10:04     CBC  Recent Labs Lab 11/05/15 0319 11/05/15 1929 11/06/15 0409 11/07/15 0701 11/08/15 0515 11/09/15 0501  WBC 9.4  --  21.3* 26.3* 15.9* 12.4*  HGB 7.1* 8.8* 8.9* 8.7* 8.1* 7.5*  HCT 22.8*  --  28.3* 27.2* 25.3* 23.3*  PLT 362  --  340 335 313 274  MCV 79.9*  --  80.4 81.7 81.7 81.2  MCH 24.8*  --  25.3* 26.0 26.0 26.1  MCHC 31.0*  --  31.4* 31.8* 31.9* 32.1  RDW 15.3*  --  15.9* 16.4* 16.2* 16.6*    Chemistries   Recent Labs Lab 11/05/15 0319 11/06/15 0409 11/06/15 1235 11/07/15 0701 11/08/15 0515 11/08/15 1257  NA 139 138  --  137 138 136  K 3.8 3.7  --  4.4 4.3 4.1  CL 106 102  --  103 101 101  CO2 27 29  --  25 26 25   GLUCOSE 180* 254*  --  148* 187* 177*  BUN 22* 20  --  40* 53* 59*  CREATININE 1.10 1.05  --  2.34* 2.99* 3.31*  CALCIUM 9.8 10.0  --  9.1 8.6* 8.6*  AST  --   --  11*  --  54* 44*  ALT  --   --  10*  --  17 16*  ALKPHOS  --   --  51  --  53 52  BILITOT  --   --  1.2  --  0.5 0.6   ------------------------------------------------------------------------------------------------------------------ estimated creatinine clearance is 21.6 mL/min (by C-G formula based on Cr of 3.31). ------------------------------------------------------------------------------------------------------------------ No results for input(s): HGBA1C in the last 72  hours. ------------------------------------------------------------------------------------------------------------------ No results for input(s): CHOL, HDL, LDLCALC, TRIG, CHOLHDL, LDLDIRECT in the last 72 hours. ------------------------------------------------------------------------------------------------------------------ No results for input(s): TSH, T4TOTAL, T3FREE, THYROIDAB in the last 72 hours.  Invalid input(s): FREET3 ------------------------------------------------------------------------------------------------------------------ No results for input(s): VITAMINB12, FOLATE, FERRITIN, TIBC, IRON, RETICCTPCT in the last 72 hours.  Coagulation profile  Recent Labs Lab 11/07/15 1159  INR 1.39    No results for input(s): DDIMER in the last 72 hours.  Cardiac Enzymes  Recent Labs Lab 11/06/15 0409 11/06/15 1010 11/06/15 1547  TROPONINI 0.08* 0.05* 0.06*   ------------------------------------------------------------------------------------------------------------------ Invalid input(s): POCBNP    Assessment & Plan    This is an 81 year old Caucasian male with past medical history significant for coronary artery disease status post CABG and PCI now has a GI bleed and symptomatic anemia. 1. Hypotension: Due to sepsis likely gallbladder source. It is post ultrasound-guided cholecystotomy tube placed. Continue Unasyn. Improving. Continue liquids today. Severe sepsis with septic shock: With renal failure: On Unasyn, WBC improved. Cultures from gallbladder-fluid showed Escherichia coli. Continue Unasyn. We can narrow down to Rocephin.   2. Atrial fibrillation with rapid ventricular rat; resolved. Now has bradycardia so we will hold the amiodarone but continue metoprolol. Patient is stable to go to telemetry. Discussed with the wife.  3. GI bleed: due to melena likely upper' ; continue protonix bid, hold Brilinta: Possible EGD tomorrow.  4. Nausea vomiting: Due to acute  cholecystitis continue Unasyn and follow CBC, continue Zofran. For acute cholecystitis patient had a cholecystostomy tube, draining serosanguineous fluid. Following the surgery possible cholecystectomy in 1-2 weeks.  5. Chest pain: Likely anginal secondary to anemia. Troponin is negative.  Cards consult appreciated they feel that this is not an MI. Recommend to proceed with the EGD  6. Acute on chronic systolic chf-Lasix on hold secondary to renal failure. Continue hydration. 7. Essential hypertension: Restarted metoprolol.   6. Diabetes mellitus type 2: Continue sliding scale insulin   7. COPD: Stable. Continue inhalers per home regimen  7. Hyper Lipidemia: Continue statin therapy  8. DVT prophylaxis: SCDs  9. GI prophylaxis: IV Protonix due to GI bleed  #10 acute on chronic anemia status post transfusion, hemoglobin stable.  anemia is due to GI bleed.  #11 acute renal failure due to ATN with GI bleed, sepsis/hypotension; continue hydration and monitor renal function closely renal function is worse today. Hold her Lasix, metformin, lisinopril. Consult nephrology secondary to worsening renal failure. Discussed with the  Wife.    Code Status Orders        Start     Ordered   11/05/15 [id number]  Full code   Continuous     11/05/15 0638           Consults gi, cards  DVT Prophylaxis   SCDs    Lab Results  Component Value Date   PLT 274 11/09/2015     Time Spent in minutes 35 minutes of critical care time spent  [facility] M.D on 11/09/2015 at 10:53 AM  Between 7am to 6pm - Pager - [phone]  After 6pm go to [email or web address] - password [email or web address] [facility]   Office  [phone]

## 2015-11-09 NOTE — Progress Notes (Signed)
CC: Acute cholecystitis Subjective: Cholecystostomy tube is in place. Patient feels much better now and his vital signs are much improved he has no nausea or vomiting and is tolerating a diet.  Objective: Vital signs in last 24 hours: Temp:  [97.5 F (36.4 C)-98 F (36.7 C)] 97.5 F (36.4 C) (12/11 0443) Pulse Rate:  [43-101] 53 (12/11 0600) Resp:  [15-43] 17 (12/11 0600) BP: (99-136)/(52-69) 107/54 mmHg (12/11 0600) SpO2:  [92 %-100 %] 99 % (12/11 0600) Weight:  [246 lb 0.5 oz (111.6 kg)] 246 lb 0.5 oz (111.6 kg) (12/11 0443) Last BM Date: 11/07/15  Intake/Output from previous day: 12/10 0701 - 12/11 0700 In: 2500 [I.V.:2400; IV Piggyback:100] Out: 1000 [Urine:850; Drains:150] Intake/Output this shift:    Physical exam:  Afebrile vital signs much improved. No icterus no jaundice soft nontender abdomen drain in place nontender calves  Lab Results: CBC   Recent Labs  11/08/15 0515 11/09/15 0501  WBC 15.9* 12.4*  HGB 8.1* 7.5*  HCT 25.3* 23.3*  PLT 313 274   BMET  Recent Labs  11/08/15 0515 11/08/15 1257  NA 138 136  K 4.3 4.1  CL 101 101  CO2 26 25  GLUCOSE 187* 177*  BUN 53* 59*  CREATININE 2.99* 3.31*  CALCIUM 8.6* 8.6*   PT/INR  Recent Labs  11/07/15 1159  LABPROT 17.2*  INR 1.39   ABG No results for input(s): PHART, HCO3 in the last 72 hours.  Invalid input(s): PCO2, PO2  Studies/Results: Ct Image Guided Fluid Drain By Catheter  11/07/2015  CLINICAL DATA:  81 year old with acute cholecystitis. EXAM: CT-GUIDED CHOLECYSTOSTOMY TUBE PLACEMENT Physician: Stephan Minister. Anselm Pancoast, MD FLUOROSCOPY TIME:  None MEDICATIONS: 1 mg Versed, 25 mcg fentanyl. A radiology nurse monitored the patient for moderate sedation. ANESTHESIA/SEDATION: Moderate sedation time: 30 minutes PROCEDURE: The procedure was explained to the patient. The risks and benefits of the procedure were discussed and the patient's questions were addressed. Informed consent was obtained from the  patient. The patient was placed supine on the CT scanner. Images through the abdomen were obtained. The right upper abdomen was prepped and draped in sterile fashion. Maximal barrier sterile technique was utilized including mask, sterile gowns, sterile gloves, sterile drape, hand hygiene and skin antiseptic. The skin was anesthetized with 1% lidocaine. 18 gauge needle was directed into the gallbladder fundus with CT guidance. Stiff Amplatz wire was advanced into the gallbladder with CT guidance. The tract was dilated to accommodate a 10.2 [place] multipurpose drain. The drain was advanced into the gallbladder and 100 mL of cloudy brown fluid was aspirated. Catheter was sutured to skin and attached to gravity bag. FINDINGS: The gallbladder is distended with surrounding edema/ fluid and there is gas in the gallbladder fundus. Findings are consistent with acute cholecystitis. 100 mL of cloudy brown fluid was aspirated. Estimated blood loss: Minimal COMPLICATIONS: None IMPRESSION: Successful CT-guided cholecystostomy tube placement. Electronically Signed   By: Markus Daft M.D.   On: 11/07/2015 17:01   US Abdomen Limited Ruq  11/07/2015  CLINICAL DATA:  Right upper quadrant pain. EXAM: US ABDOMEN LIMITED - RIGHT UPPER QUADRANT COMPARISON:  CT 11/06/2015. FINDINGS: Gallbladder: Gallbladder is distended. Sludge is noted throughout the gallbladder. Gallbladder wall is thickened to 7 mm. Small amount of pericholecystic fluid noted. Positive Murphy sign. Findings are consistent with cholecystitis. Common bile duct: Diameter: 6 mm. Liver: No focal hepatic abnormality identified. Hepatic echotexture normal. Left hepatic lobe not well visualized due to overlying bowel gas. IMPRESSION: Distended gallbladder with sludge.  Gallbladder wall thickening. Small amount of pericholecystic fluid. Positive Murphy sign. Findings consistent with gallbladder sludge with associated cholecystitis. No biliary ductal distention . Electronically  Signed   By: Marcello Moores  Register   On: 11/07/2015 10:04    Anti-infectives: Anti-infectives    Start     Dose/Rate Route Frequency Ordered Stop   11/08/15 1923  Ampicillin-Sulbactam (UNASYN) 3 g in sodium chloride 0.9 % 100 mL IVPB     3 g 100 mL/hr over 60 Minutes Intravenous Every 12 hours 11/08/15 0954     11/06/15 1300  Ampicillin-Sulbactam (UNASYN) 3 g in sodium chloride 0.9 % 100 mL IVPB  Status:  Discontinued     3 g 100 mL/hr over 60 Minutes Intravenous Every 6 hours 11/06/15 1203 11/08/15 0954      Assessment/Plan:  Patient much improved with cholecystostomy tube. Suspect he will be able to transfer out of the ICU today leaving the tube in until decision about surgery is made which could be performed in the next 1 or 2 weeks.  Florene Glen, MD, FACS  11/09/2015

## 2015-11-09 NOTE — Consult Note (Signed)
  GI Inpatient Follow-up Note  Patient Identification: Justin Leonard is a 81 y.o. male with possible UGIB on Brillinta  Subjective:   Feels better today.  Wants to go home. One dark stool before surgery. None since. Denies abd pain, n/v, c/p.   Scheduled Inpatient Medications:  . amiodarone  200 mg Oral BID  . ampicillin-sulbactam (UNASYN) IV  3 g Intravenous Q12H  . docusate sodium  100 mg Oral BID  . FLUoxetine  20 mg Oral Daily  . insulin aspart  0-9 Units Subcutaneous TID WC  . metoprolol  50 mg Oral BID  . pantoprazole (PROTONIX) IV  40 mg Intravenous Q12H  . pravastatin  20 mg Oral q1800  . sodium chloride  3 mL Intravenous Q12H  . tiotropium  1 capsule Inhalation Daily    Continuous Inpatient Infusions:   . sodium chloride 100 mL/hr at 11/09/15 0400  . amiodarone Stopped (11/08/15 1754)    PRN Inpatient Medications:  acetaminophen **OR** acetaminophen, albuterol, hydrALAZINE, morphine injection, nitroGLYCERIN, ondansetron **OR** ondansetron (ZOFRAN) IV, oxyCODONE-acetaminophen, promethazine  Review of Systems: Constitutional: Weight is stable.  Eyes: No changes in vision. ENT: No oral lesions, sore throat.  GI: see HPI.  Heme/Lymph: No easy bruising.  CV: No chest pain.  GU: No hematuria.  Integumentary: No rashes.  Neuro: No headaches.  Psych: No depression/anxiety.  Endocrine: No heat/cold intolerance.  Allergic/Immunologic: No urticaria.  Resp: No cough, SOB.  Musculoskeletal: No joint swelling.    Physical Examination: BP 107/54 mmHg  Pulse 53  Temp(Src) 97.5 F (36.4 C) (Oral)  Resp 17  Ht 6\' 3"  (1.905 m)  Wt 111.6 kg (246 lb 0.5 oz)  BMI 30.75 kg/m2  SpO2 99% Gen: NAD, alert and oriented x 4 HEENT: PEERLA, EOMI, Neck: supple, no JVD or thyromegaly Chest: CTA bilaterally, no wheezes, crackles, or other adventitious sounds CV: RRR, no m/g/c/r Abd: soft, mild RUQ tenderness, ND, +BS in all four quadrants; no HSM, guarding, ridigity, or  rebound tenderness, + surg wounds c/d/i Ext: no edema, well perfused with 2+ pulses, Skin: no rash or lesions noted Lymph: no LAD  Data: Lab Results  Component Value Date   WBC 12.4* 11/09/2015   HGB 7.5* 11/09/2015   HCT 23.3* 11/09/2015   MCV 81.2 11/09/2015   PLT 274 11/09/2015    Recent Labs Lab 11/07/15 0701 11/08/15 0515 11/09/15 0501  HGB 8.7* 8.1* 7.5*   Lab Results  Component Value Date   NA 136 11/08/2015   K 4.1 11/08/2015   CL 101 11/08/2015   CO2 25 11/08/2015   BUN 59* 11/08/2015   CREATININE 3.31* 11/08/2015   Lab Results  Component Value Date   ALT 16* 11/08/2015   AST 44* 11/08/2015   ALKPHOS 52 11/08/2015   BILITOT 0.6 11/08/2015    Recent Labs Lab 11/07/15 1159  INR 1.39   Assessment/Plan: Mr. Schults is a 81 y.o. male with rapid Afib. UGI bleeding, which has stopped off brilinta. No further bleeding but Hgb still trending down.   Recommendations: - cont PPI - hold Brillinta - likely EGD on MOnday with Dr Candace Cruise   Please call with questions or concerns.  Kinzly Pierrelouis, Grace Blight, MD

## 2015-11-09 NOTE — Progress Notes (Signed)
ANTIBIOTIC CONSULT NOTE- FOLLOW UP   Pharmacy Consult for ampicillin/sulbactam Indication: aspiration pneumonia  No Known Allergies  Patient Measurements: Height: 6\' 3"  (190.5 cm) Weight: 246 lb 0.5 oz (111.6 kg) IBW/kg (Calculated) : 84.5  Vital Signs: Temp: 97.5 F (36.4 C) (12/11 0443) Temp Source: Oral (12/11 0443) BP: 107/54 mmHg (12/11 0600) Pulse Rate: 53 (12/11 0600) Intake/Output from previous day: 12/10 0701 - 12/11 0700 In: 2500 [I.V.:2400; IV Piggyback:100] Out: 1000 [Urine:850; Drains:150]  Recent Labs  11/07/15 0701 11/08/15 0515 11/08/15 1257 11/09/15 0501  WBC 26.3* 15.9*  --  12.4*  HGB 8.7* 8.1*  --  7.5*  PLT 335 313  --  274  CREATININE 2.34* 2.99* 3.31*  --    Estimated Creatinine Clearance: 21.6 mL/min (by C-G formula based on Cr of 3.31).  Medications:  Anti-infectives    Start     Dose/Rate Route Frequency Ordered Stop   11/08/15 1923  Ampicillin-Sulbactam (UNASYN) 3 g in sodium chloride 0.9 % 100 mL IVPB     3 g 100 mL/hr over 60 Minutes Intravenous Every 12 hours 11/08/15 0954     11/06/15 1300  Ampicillin-Sulbactam (UNASYN) 3 g in sodium chloride 0.9 % 100 mL IVPB  Status:  Discontinued     3 g 100 mL/hr over 60 Minutes Intravenous Every 6 hours 11/06/15 1203 11/08/15 0954     Assessment: Pharmacy consulted to dose ampicillin/sulbactam for aspiration pneumonia in this 81 year old male admitted with a GIB.   12/10: Scr: 2.99 ( up from 2.34) CrCl: 24 ml/min  Plan:  Will continue Unasyn 3 g IV q12 hours.  Pharmacy will continue to monitor and adjust dose if needed.    Larene Beach, PharmD   11/09/2015,8:28 AM

## 2015-11-09 NOTE — Consult Note (Signed)
Date: 11/09/2015                  Patient Name:  Justin Leonard  MRN: [id number]  DOB: 09-Nov-1927  Age / Sex: 81 y.o., male         PCP: [name], MD                 Service Requesting Consult: Internal medicine/Dr Vianne Bulls                 Reason for Consult: ARF            History of Present Illness: Patient is a 81 y.o. male with medical problems of congestive heart failure, hypertension, diabetes, coronary disease, CABG hyperlipidemia, peripheral neuropathy, COPD, who was admitted to [facility] on 11/05/2015 for evaluation of chest pain.   He presented to the emergency room for chest pain. He was given aspirin and nitroglycerin which helped his chest pain. He also reported nausea, diarrhea prior to admission CT scan done in the ER with IV contrast showed gas bubbles in the gallbladder. He was admitted for further evaluation and management and followed by surgical team. Eventually, an ultrasound-guided cholecystostomy tube was placed for acute cholecystitis. It was done on December 9. After drainage, patient felt better. Other complications during hospital course includes atrial fibrillation/flutter Patient was also noted to have increasing creatinine throughout the hospital stay. Admission creatinine was 1.05. Today's creatinine has increased to 3.31. Nephrology consult has been obtained for further evaluation.   Medications: Outpatient medications: Prescriptions prior to admission  Medication Sig Dispense Refill Last Dose  . acetaminophen (TYLENOL) 325 MG tablet Take 650 mg by mouth every 6 (six) hours as needed.   PRN at PRN  . aspirin EC 81 MG tablet Take 81 mg by mouth daily.   11/04/2015 at Unknown time  . FLUoxetine (PROZAC) 20 MG capsule Take 20 mg by mouth daily.   11/04/2015 at Unknown time  . furosemide (LASIX) 20 MG tablet Take 20-40 mg by mouth daily as needed for fluid.    11/04/2015 at Unknown time  . glyBURIDE (DIABETA) 5 MG tablet Take 5 mg by mouth 2 (two)  times daily with a meal.   11/04/2015 at Unknown time  . lisinopril (PRINIVIL,ZESTRIL) 40 MG tablet Take 40 mg by mouth daily.   11/04/2015 at Unknown time  . lovastatin (MEVACOR) 20 MG tablet Take 20 mg by mouth at bedtime.   11/04/2015 at Unknown time  . metFORMIN (GLUCOPHAGE) 500 MG tablet Take 500 mg by mouth 2 (two) times daily with a meal. 2 tablets in AM and 1 tablet in PM   11/04/2015 at Unknown time  . metoprolol (LOPRESSOR) 50 MG tablet Take 50 mg by mouth 2 (two) times daily.   11/04/2015 at Unknown time  . nitroGLYCERIN (NITROSTAT) 0.4 MG SL tablet Place 1 tablet under the tongue every 5 (five) minutes as needed.   11/05/2015 at Unknown time  . pantoprazole (PROTONIX) 40 MG tablet Take 40 mg by mouth daily.   11/04/2015 at Unknown time  . ticagrelor (BRILINTA) 90 MG TABS tablet Take 1 tablet (90 mg total) by mouth 2 (two) times daily. 60 tablet 0 11/04/2015 at Unknown time  . Tiotropium Bromide Monohydrate (SPIRIVA RESPIMAT) 2.5 MCG/ACT AERS Inhale 2 puffs into the lungs daily.   11/04/2015 at Unknown time  . VENTOLIN HFA 108 (90 BASE) MCG/ACT inhaler Inhale 1-2 puffs into the lungs as needed.   PRN at PRN  .  furosemide (LASIX) 40 MG tablet Take 1 tablet (40 mg total) by mouth 2 (two) times daily. (Patient not taking: Reported on 11/05/2015) 60 tablet 0 Not Taking at Unknown time    Current medications: Current Facility-Administered Medications  Medication Dose Route Frequency Provider Last Rate Last Dose  . 0.9 %  sodium chloride infusion   Intravenous Continuous Epifanio Lesches, MD 100 mL/hr at 11/09/15 0400    . acetaminophen (TYLENOL) tablet 650 mg  650 mg Oral Q6H PRN Harrie Foreman, MD       Or  . acetaminophen (TYLENOL) suppository 650 mg  650 mg Rectal Q6H PRN Harrie Foreman, MD   650 mg at 11/06/15 2155  . albuterol (PROVENTIL) (2.5 MG/3ML) 0.083% nebulizer solution 3 mL  3 mL Inhalation PRN Harrie Foreman, MD      . Ampicillin-Sulbactam (UNASYN) 3 g in sodium chloride  0.9 % 100 mL IVPB  3 g Intravenous Q12H Epifanio Lesches, MD   3 g at 11/09/15 0754  . docusate sodium (COLACE) capsule 100 mg  100 mg Oral BID Harrie Foreman, MD   100 mg at 11/09/15 1007  . FLUoxetine (PROZAC) capsule 20 mg  20 mg Oral Daily Harrie Foreman, MD   20 mg at 11/09/15 1007  . hydrALAZINE (APRESOLINE) injection 10 mg  10 mg Intravenous Q6H PRN Dustin Flock, MD      . insulin aspart (novoLOG) injection 0-9 Units  0-9 Units Subcutaneous TID WC Dustin Flock, MD   2 Units at 11/09/15 1143  . metoprolol (LOPRESSOR) tablet 50 mg  50 mg Oral BID Harrie Foreman, MD   50 mg at 11/09/15 0434  . morphine 2 MG/ML injection 1 mg  1 mg Intravenous Q1H PRN Markus Daft, MD   1 mg at 11/07/15 1736  . nitroGLYCERIN (NITROSTAT) SL tablet 0.4 mg  0.4 mg Sublingual Q5 min PRN Harrie Foreman, MD      . ondansetron [facility]) tablet 4 mg  4 mg Oral Q6H PRN Harrie Foreman, MD       Or  . ondansetron [facility]) injection 4 mg  4 mg Intravenous Q6H PRN Harrie Foreman, MD   4 mg at 11/06/15 1737  . oxyCODONE-acetaminophen (PERCOCET/ROXICET) 5-325 MG per tablet 1-2 tablet  1-2 tablet Oral Q6H PRN Markus Daft, MD   1 tablet at 11/07/15 2028  . pantoprazole (PROTONIX) injection 40 mg  40 mg Intravenous Q12H Harrie Foreman, MD   40 mg at 11/09/15 0753  . pravastatin (PRAVACHOL) tablet 20 mg  20 mg Oral q1800 Harrie Foreman, MD   20 mg at 11/08/15 1804  . promethazine (PHENERGAN) injection 12.5 mg  12.5 mg Intravenous Q6H PRN Lance Coon, MD   12.5 mg at 11/05/15 2353  . sodium chloride 0.9 % injection 3 mL  3 mL Intravenous Q12H Harrie Foreman, MD   3 mL at 11/09/15 1136  . tiotropium (SPIRIVA) inhalation capsule 18 mcg  1 capsule Inhalation Daily Harrie Foreman, MD   18 mcg at 11/09/15 1132      Allergies: No Known Allergies    Past Medical History: Past Medical History  Diagnosis Date  . CHF (congestive heart failure) ([place])   . Hypertension   . Diabetes mellitus without  complication ([place])   . Coronary artery disease   . Hyperlipidemia   . Deafness   . Peripheral neuropathy ([place])   . Back pain   . Umbilical hernia   .  Depression   . Melena   . COPD (chronic obstructive pulmonary disease) ([place])   . Skin cancer      Past Surgical History: Past Surgical History  Procedure Laterality Date  . Coronary artery bypass graft    . Cardiac catheterization Left 07/17/2015    Procedure: Right/Left Heart Cath and Coronary Angiography;  Surgeon: [name], MD;  Location: [facility];  Service: Cardiovascular;  Laterality: Left;  . Cardiac catheterization N/A 07/17/2015    Procedure: Coronary Stent Intervention;  Surgeon: [name], MD;  Location: [facility];  Service: Cardiovascular;  Laterality: N/A;  . Coronary angioplasty       Family History: Family History  Problem Relation Age of Onset  . Anemia Neg Hx   . Arrhythmia Neg Hx   . Asthma Neg Hx   . Clotting disorder Neg Hx   . Fainting Neg Hx   . Heart attack Neg Hx   . Heart disease Neg Hx   . Heart failure Neg Hx   . Hyperlipidemia Neg Hx   . Hypertension Father   . CVA Father      Social History: Social History   Social History  . Marital Status: Married    Spouse Name: N/A  . Number of Children: N/A  . Years of Education: N/A   Occupational History  . Not on file.   Social History Main Topics  . Smoking status: Former Smoker -- 3.00 packs/day for 30 years    Types: Cigarettes    Quit date: 04/16/1978  . Smokeless tobacco: Never Used     Comment: Quit smoking 1979  . Alcohol Use: No  . Drug Use: No  . Sexual Activity: Not on file   Other Topics Concern  . Not on file   Social History Narrative     Review of Systems: Gen: Feels poorly, no fever HEENT: Feels thirsty, no sore throat, decreased hearing CV: Atrial fibrillation and flutter during hospitalization Resp: No complaints of cough or shortness of breath at present GI:  Cholecystostomy tube placed, able to eat soft solids and liquids GU : History of BPH. Some difficulty with urination. MS: No complaints at present Derm:  No present complaints Psych: No complaints Heme: Low hemoglobin noted Neuro: No complaints Endocrine no complaints  Vital Signs: Blood pressure 112/50, pulse 53, temperature 97.9 F (36.6 C), temperature source Oral, resp. rate 16, height 6\' 3"  (1.905 m), weight 111.6 kg (246 lb 0.5 oz), SpO2 100 %.   Intake/Output Summary (Last 24 hours) at 11/09/15 1301 Last data filed at 11/09/15 [id number]  Gross per 24 hour  Intake   2375 ml  Output   1000 ml  Net   1375 ml    Weight trends: Filed Weights   11/07/15 0500 11/08/15 0444 11/09/15 0443  Weight: 104.5 kg (230 lb 6.1 oz) 108 kg (238 lb 1.6 oz) 111.6 kg (246 lb 0.5 oz)    Physical Exam: General:  elderly gentleman, laying in the bed   HEENT  somewhat dry oral mucous membranes, anicteric sclera   Neck:  supple   Lungs:  normal respiratory effort, clear to auscultation bilaterally   Heart::  irregular rhythm, soft systolic murmur   Abdomen:  distended, midline hernia, tympanic, bowel sounds present   Extremities:  no peripheral edema   Neurologic:  alert, oriented, follows commands   Skin:  no acute rashes   Access:   Foley:        Lab  results: Basic Metabolic Panel:  Recent Labs Lab 11/07/15 0701 11/08/15 0515 11/08/15 1257  NA 137 138 136  K 4.4 4.3 4.1  CL 103 101 101  CO2 25 26 25   GLUCOSE 148* 187* 177*  BUN 40* 53* 59*  CREATININE 2.34* 2.99* 3.31*  CALCIUM 9.1 8.6* 8.6*    Liver Function Tests:  Recent Labs Lab 11/08/15 1257  AST 44*  ALT 16*  ALKPHOS 52  BILITOT 0.6  PROT 6.4*  ALBUMIN 2.7*    Recent Labs Lab 11/06/15 1235  LIPASE 17   No results for input(s): AMMONIA in the last 168 hours.  CBC:  Recent Labs Lab 11/08/15 0515 11/09/15 0501  WBC 15.9* 12.4*  HGB 8.1* 7.5*  HCT 25.3* 23.3*  MCV 81.7 81.2  PLT 313 274     Cardiac Enzymes:  Recent Labs Lab 11/06/15 1547  TROPONINI 0.06*    BNP: Invalid input(s): POCBNP  CBG:  Recent Labs Lab 11/08/15 2147 11/09/15 0143 11/09/15 0433 11/09/15 0701 11/09/15 1139  GLUCAP 172* 165* 174* 158* 180*    Microbiology: Recent Results (from the past 720 hour(s))  Culture, blood (routine x 2)     Status: None (Preliminary result)   Collection Time: 11/06/15 12:35 PM  Result Value Ref Range Status   Specimen Description BLOOD RIGHT ASSIST CONTROL  Final   Special Requests   Final    BOTTLES DRAWN AEROBIC AND ANAEROBIC  [place] ANAERO [place] AERO   Culture NO GROWTH 3 DAYS  Final   Report Status PENDING  Incomplete  Culture, blood (routine x 2)     Status: None (Preliminary result)   Collection Time: 11/06/15 12:38 PM  Result Value Ref Range Status   Specimen Description BLOOD LEFT HAND  Final   Special Requests BOTTLES DRAWN AEROBIC AND ANAEROBIC  3 CC  Final   Culture NO GROWTH 3 DAYS  Final   Report Status PENDING  Incomplete  Body fluid culture     Status: None (Preliminary result)   Collection Time: 11/07/15  3:40 PM  Result Value Ref Range Status   Specimen Description GALL BLADDER  Final   Special Requests NONE  Final   Gram Stain   Final    RARE WBC SEEN MANY GRAM POSITIVE RODS MANY GRAM NEGATIVE RODS    Culture HEAVY GROWTH ESCHERICHIA COLI  Final   Report Status PENDING  Incomplete   Organism ID, Bacteria ESCHERICHIA COLI  Final      Susceptibility   Escherichia coli - MIC*    AMPICILLIN <=2 SENSITIVE Sensitive     CEFTAZIDIME <=1 SENSITIVE Sensitive     CEFAZOLIN <=4 SENSITIVE Sensitive     CEFTRIAXONE <=1 SENSITIVE Sensitive     CIPROFLOXACIN <=0.25 SENSITIVE Sensitive     GENTAMICIN <=1 SENSITIVE Sensitive     IMIPENEM <=0.25 SENSITIVE Sensitive     TRIMETH/SULFA <=20 SENSITIVE Sensitive     PIP/TAZO Value in next row Sensitive      SENSITIVE<=4    * HEAVY GROWTH ESCHERICHIA COLI     Coagulation Studies:  Recent  Labs  11/07/15 1159  LABPROT 17.2*  INR 1.39    Urinalysis: No results for input(s): COLORURINE, LABSPEC, PHURINE, GLUCOSEU, HGBUR, BILIRUBINUR, KETONESUR, PROTEINUR, UROBILINOGEN, NITRITE, LEUKOCYTESUR in the last 72 hours.  Invalid input(s): APPERANCEUR      Imaging: Ct Image Guided Fluid Drain By Catheter  11/07/2015  CLINICAL DATA:  81 year old with acute cholecystitis. EXAM: CT-GUIDED CHOLECYSTOSTOMY TUBE PLACEMENT Physician: Stephan Minister. Anselm Pancoast, MD FLUOROSCOPY  TIME:  None MEDICATIONS: 1 mg Versed, 25 mcg fentanyl. A radiology nurse monitored the patient for moderate sedation. ANESTHESIA/SEDATION: Moderate sedation time: 30 minutes PROCEDURE: The procedure was explained to the patient. The risks and benefits of the procedure were discussed and the patient's questions were addressed. Informed consent was obtained from the patient. The patient was placed supine on the CT scanner. Images through the abdomen were obtained. The right upper abdomen was prepped and draped in sterile fashion. Maximal barrier sterile technique was utilized including mask, sterile gowns, sterile gloves, sterile drape, hand hygiene and skin antiseptic. The skin was anesthetized with 1% lidocaine. 18 gauge needle was directed into the gallbladder fundus with CT guidance. Stiff Amplatz wire was advanced into the gallbladder with CT guidance. The tract was dilated to accommodate a 10.2 [place] multipurpose drain. The drain was advanced into the gallbladder and 100 mL of cloudy brown fluid was aspirated. Catheter was sutured to skin and attached to gravity bag. FINDINGS: The gallbladder is distended with surrounding edema/ fluid and there is gas in the gallbladder fundus. Findings are consistent with acute cholecystitis. 100 mL of cloudy brown fluid was aspirated. Estimated blood loss: Minimal COMPLICATIONS: None IMPRESSION: Successful CT-guided cholecystostomy tube placement. Electronically Signed   By: Markus Daft M.D.   On:  11/07/2015 17:01      Assessment & Plan: Pt is a 81 y.o. yo male with congestive heart failure, hypertension, diabetes, coronary disease, CABG hyperlipidemia, peripheral neuropathy, COPD, who was admitted to [facility] on 11/05/2015 for evaluation of chest pain. Hospital course complicated by diagnosis of acute cholecystitis, status post placement of CT-guided cholecystostomy tube on December 9,  acute renal failure  1. Acute renal failure. Baseline creatinine is normal at 1.05 2. BPH 3. Sepsis from acute cholecystitis 4. Atrial fibrillation with rapid ventricular response 5. Melena  Differential diagnoses includes IV contrast exposure at the time of admission, acute renal failure secondary to ATN from sepsis, hypotension; obstructive uropathy Plan: Bedside bladder scan. If urinary retention is noted, recommend placement of Foley catheter Avoid hypotension Recommend starting Flomax Avoid nephrotoxins such as IV contrast Avoid Ace inhibitors, metformin Electrolytes and Volume status are acceptable No acute indication for Dialysis at present

## 2015-11-10 ENCOUNTER — Inpatient Hospital Stay: Payer: Medicare Other | Admitting: Anesthesiology

## 2015-11-10 ENCOUNTER — Encounter: Payer: Self-pay | Admitting: Anesthesiology

## 2015-11-10 ENCOUNTER — Encounter
Admission: EM | Disposition: A | Discharge status: Skilled Nursing Facility | Payer: Self-pay | Source: Home / Self Care | Attending: Internal Medicine

## 2015-11-10 DIAGNOSIS — K8000 Calculus of gallbladder with acute cholecystitis without obstruction: Secondary | ICD-10-CM

## 2015-11-10 HISTORY — PX: ESOPHAGOGASTRODUODENOSCOPY: SHX5428

## 2015-11-10 LAB — BASIC METABOLIC PANEL
Anion gap: 7 (ref 5–15)
BUN: 69 mg/dL — AB (ref 6–20)
CHLORIDE: 104 mmol/L (ref 101–111)
CO2: 24 mmol/L (ref 22–32)
CREATININE: 3.13 mg/dL — AB (ref 0.61–1.24)
Calcium: 8.7 mg/dL — ABNORMAL LOW (ref 8.9–10.3)
GFR calc Af Amer: 19 mL/min — ABNORMAL LOW (ref >60)
GFR calc non Af Amer: 17 mL/min — ABNORMAL LOW (ref >60)
Glucose, Bld: 140 mg/dL — ABNORMAL HIGH (ref 65–99)
Potassium: 4 mmol/L (ref 3.5–5.1)
SODIUM: 135 mmol/L (ref 135–145)

## 2015-11-10 LAB — BODY FLUID CULTURE

## 2015-11-10 LAB — GLUCOSE, CAPILLARY
Glucose-Capillary: 128 mg/dL — ABNORMAL HIGH (ref 65–99)
Glucose-Capillary: 132 mg/dL — ABNORMAL HIGH (ref 65–99)
Glucose-Capillary: 138 mg/dL — ABNORMAL HIGH (ref 65–99)
Glucose-Capillary: 141 mg/dL — ABNORMAL HIGH (ref 65–99)
Glucose-Capillary: 151 mg/dL — ABNORMAL HIGH (ref 65–99)

## 2015-11-10 SURGERY — EGD (ESOPHAGOGASTRODUODENOSCOPY)
Anesthesia: General

## 2015-11-10 MED ORDER — METOPROLOL TARTRATE 25 MG PO TABS
25.0000 mg | ORAL_TABLET | Freq: Two times a day (BID) | ORAL | Status: DC
  Administered 2015-11-10: 25 mg via ORAL
  Filled 2015-11-10: qty 1

## 2015-11-10 MED ORDER — GLYCOPYRROLATE 0.2 MG/ML IJ SOLN
INTRAMUSCULAR | Status: DC | PRN
  Administered 2015-11-10: 0.2 mg via INTRAVENOUS

## 2015-11-10 MED ORDER — PROPOFOL 500 MG/50ML IV EMUL
INTRAVENOUS | Status: DC | PRN
  Administered 2015-11-10: 120 ug/kg/min via INTRAVENOUS

## 2015-11-10 MED ORDER — PROPOFOL 10 MG/ML IV BOLUS
INTRAVENOUS | Status: DC | PRN
  Administered 2015-11-10: 20 mg via INTRAVENOUS

## 2015-11-10 MED ORDER — BISACODYL 10 MG RE SUPP
10.0000 mg | Freq: Once | RECTAL | Status: AC
  Administered 2015-11-10: 10 mg via RECTAL
  Filled 2015-11-10: qty 1

## 2015-11-10 MED ORDER — SENNOSIDES-DOCUSATE SODIUM 8.6-50 MG PO TABS
1.0000 | ORAL_TABLET | Freq: Every day | ORAL | Status: DC
  Administered 2015-11-10 – 2015-11-11 (×2): 1 via ORAL
  Filled 2015-11-10 (×2): qty 1

## 2015-11-10 MED ORDER — SODIUM CHLORIDE 0.9 % IV SOLN
INTRAVENOUS | Status: DC
  Administered 2015-11-10 (×2): via INTRAVENOUS

## 2015-11-10 MED ORDER — INSULIN ASPART 100 UNIT/ML ~~LOC~~ SOLN
0.0000 [IU] | Freq: Three times a day (TID) | SUBCUTANEOUS | Status: DC
  Administered 2015-11-11 – 2015-11-12 (×4): 1 [IU] via SUBCUTANEOUS
  Administered 2015-11-13: 2 [IU] via SUBCUTANEOUS
  Administered 2015-11-13: 1 [IU] via SUBCUTANEOUS
  Administered 2015-11-13 – 2015-11-14 (×3): 2 [IU] via SUBCUTANEOUS
  Filled 2015-11-10: qty 2
  Filled 2015-11-10: qty 1
  Filled 2015-11-10: qty 2
  Filled 2015-11-10 (×3): qty 1
  Filled 2015-11-10: qty 2
  Filled 2015-11-10: qty 1
  Filled 2015-11-10: qty 2

## 2015-11-10 MED ORDER — SODIUM CHLORIDE 0.9 % IV SOLN
INTRAVENOUS | Status: DC
  Administered 2015-11-10: 14:00:00 via INTRAVENOUS

## 2015-11-10 MED ORDER — IPRATROPIUM-ALBUTEROL 0.5-2.5 (3) MG/3ML IN SOLN: 3.0000 mL | Freq: Once | RESPIRATORY_TRACT | Status: DC

## 2015-11-10 MED ORDER — INSULIN ASPART 100 UNIT/ML ~~LOC~~ SOLN: 0.0000 [IU] | Freq: Every day | SUBCUTANEOUS | Status: DC

## 2015-11-10 MED ORDER — METOPROLOL TARTRATE 25 MG PO TABS
25.0000 mg | ORAL_TABLET | Freq: Two times a day (BID) | ORAL | Status: DC
  Filled 2015-11-10: qty 1

## 2015-11-10 MED ORDER — METOPROLOL TARTRATE 25 MG PO TABS: 25.0000 mg | ORAL_TABLET | Freq: Two times a day (BID) | ORAL | Status: DC

## 2015-11-10 MED ORDER — IPRATROPIUM-ALBUTEROL 0.5-2.5 (3) MG/3ML IN SOLN
RESPIRATORY_TRACT | Status: AC
  Filled 2015-11-10: qty 3

## 2015-11-10 MED ORDER — LIDOCAINE HCL (CARDIAC) 20 MG/ML IV SOLN
INTRAVENOUS | Status: DC | PRN
  Administered 2015-11-10: 60 mg via INTRAVENOUS

## 2015-11-10 NOTE — Progress Notes (Signed)
Patient ID: Justin Leonard, male   DOB: 1927/07/30, 81 y.o.   MRN: [id number] Says he had a very restless night. No pain or other specific complaint. Tired of being in hospital. Has not had a BM in 4 days No fever. Tachycardia- a fib. BP stable. Abdomen is soft, non tender. 3-4 cm ventral hernia-says it has been there a long time. Acute cholecystitis is under control. He has low Hgb with UGI bleed. Scheduled to have Upper endoscopy today. Discussed all with pt and his son. Try a dulcolax supp today for his bowel.

## 2015-11-10 NOTE — Progress Notes (Signed)
Notified Dr. Ether Griffins of Pulse of 64 okay to give metoprolol and put in parameter to hold if HR less than 55. Also okay to change orders to ACHS fingersticks with sliding scale. Place order for sennokot 1 tablet daily at night.

## 2015-11-10 NOTE — Transfer of Care (Signed)
Immediate Anesthesia Transfer of Care Note  Patient: Justin Leonard  Procedure(s) Performed: Procedure(s): ESOPHAGOGASTRODUODENOSCOPY (EGD) (N/A)  Patient Location: PACU  Anesthesia Type:General  Level of Consciousness: sedated  Airway & Oxygen Therapy: Patient Spontanous Breathing and Patient connected to nasal cannula oxygen  Post-op Assessment: Report given to RN and Post -op Vital signs reviewed and stable  Post vital signs: Reviewed and stable  Last Vitals:  Filed Vitals:   11/10/15 1436 11/10/15 1437  BP: 107/43 107/43  Pulse: 62 62  Temp:  36.3 C  Resp: 15 15    Complications: No apparent anesthesia complications

## 2015-11-10 NOTE — Op Note (Signed)
EGD showed food residual in the fundus. No ulcers seen. No active bleeding now.  Continue daily PPI for now. Overall better. Would resume diet. Unless pt is showing active signs of bleeding, do not plan doing more GI procedures, such as colonoscopy. Will sign off. Thanks.

## 2015-11-10 NOTE — Op Note (Signed)
[facility] Patient Name: Justin Leonard Procedure Date: 11/10/2015 2:18 PM MRN: [id number] Account #: [id number] Date of Birth: 03-04-1927 Admit Type: Inpatient Age: 81 Room: [facility] ENDO ROOM 4 Gender: Male Note Status: Finalized Procedure:         Upper GI endoscopy Indications:       Iron deficiency anemia, Melena Providers:         Lupita Dawn. Candace Cruise, MD Referring MD:      Wynona Canes. Kym Groom, MD (Referring MD) Medicines:         Monitored Anesthesia Care Complications:     No immediate complications. Procedure:         Pre-Anesthesia Assessment:                    - Prior to the procedure, a History and Physical was                     performed, and patient medications, allergies and                     sensitivities were reviewed. The patient's tolerance of                     previous anesthesia was reviewed.                    - The risks and benefits of the procedure and the sedation                     options and risks were discussed with the patient. All                     questions were answered and informed consent was obtained.                    - After reviewing the risks and benefits, the patient was                     deemed in satisfactory condition to undergo the procedure.                    After obtaining informed consent, the endoscope was passed                     under direct vision. Throughout the procedure, the                     patient's blood pressure, pulse, and oxygen saturations                     were monitored continuously. The Olympus GIF-160 endoscope                     (S#. [id number]) was introduced through the mouth, and                     advanced to the second part of duodenum. The upper GI                     endoscopy was accomplished without difficulty. The patient                     tolerated the procedure well. Findings:      The examined esophagus was normal.  A medium amount of food (residue) was  found in the gastric fundus.      The exam was otherwise without abnormality.      The examined duodenum was normal. Impression:        - Normal esophagus.                    - A medium amount of food (residue) in the stomach.                    - The examination was otherwise normal.                    - Normal examined duodenum.                    - No specimens collected. Recommendation:    - Observe patient's clinical course.                    - Continue present medications.                    - The findings and recommendations were discussed with the                     patient. Procedure Code(s): --- Professional ---                    [phone], Esophagogastroduodenoscopy, flexible, transoral;                     diagnostic, including collection of specimen(s) by                     brushing or washing, when performed (separate procedure) Diagnosis Code(s): --- Professional ---                    D50.9, Iron deficiency anemia, unspecified                    K92.1, Melena CPT copyright 2014 American Medical Association. All rights reserved. The codes documented in this report are preliminary and upon coder review may  be revised to meet current compliance requirements. [name], MD 11/10/2015 2:33:19 PM This report has been signed electronically. Number of Addenda: 0 Note Initiated On: 11/10/2015 2:18 PM      [facility]

## 2015-11-10 NOTE — Anesthesia Postprocedure Evaluation (Signed)
Anesthesia Post Note  Patient: Justin Leonard  Procedure(s) Performed: Procedure(s) (LRB): ESOPHAGOGASTRODUODENOSCOPY (EGD) (N/A)  Patient location during evaluation: Endoscopy Anesthesia Type: General Level of consciousness: awake and alert Pain management: pain level controlled Vital Signs Assessment: post-procedure vital signs reviewed and stable Respiratory status: spontaneous breathing, nonlabored ventilation, respiratory function stable and patient connected to nasal cannula oxygen Cardiovascular status: blood pressure returned to baseline and stable Postop Assessment: no signs of nausea or vomiting Anesthetic complications: no    Last Vitals:  Filed Vitals:   11/10/15 1507 11/10/15 1546  BP: 140/57 161/67  Pulse: 65 65  Temp:  36.9 C  Resp: 18 14    Last Pain:  Filed Vitals:   11/10/15 1546  PainSc: 0-No pain                 Precious Haws Renardo Cheatum

## 2015-11-10 NOTE — Progress Notes (Signed)
ANTIBIOTIC CONSULT NOTE- FOLLOW UP   Pharmacy Consult for ampicillin/sulbactam Indication: sepsis, suspected biliary source  No Known Allergies  Patient Measurements: Height: 6\' 3"  (190.5 cm) Weight: 246 lb 0.5 oz (111.6 kg) IBW/kg (Calculated) : 84.5  Vital Signs: Temp: 98.4 F (36.9 C) (12/12 0544) Temp Source: Oral (12/12 0544) BP: 139/67 mmHg (12/12 0544) Pulse Rate: 54 (12/12 0544)  Recent Labs  11/08/15 0515 11/08/15 1257 11/09/15 0501 11/10/15 0536  WBC 15.9*  --  12.4*  --   HGB 8.1*  --  7.5*  --   PLT 313  --  274  --   CREATININE 2.99* 3.31*  --  3.13*   Estimated Creatinine Clearance: 22.8 mL/min (by C-G formula based on Cr of 3.13).  Assessment: Pharmacy consulted to dose ampicillin/sulbactam for sepsis in this 81 year old male with acute renal failure. Patient acute cholecystitis, cholecystostomy tube placed 12/9. E. Coli in biliary fluid, covering for likely biliary source.  Currently day 5 of unasyn  Plan:  Creatinine still up, est CrCl: 32ml/min. Will continue current orders for unasyn 3gm IV Q12H  Pharmacy to follow per consult  Rexene Edison, PharmD Clinical Pharmacist  11/10/2015 9:38 AM

## 2015-11-10 NOTE — Care Management Important Message (Signed)
Important Message  Patient Details  Name: YOUNESS LAWES MRN: [id number] Date of Birth: [birth year]   Medicare Important Message Given:  Yes    Juliann Pulse A Kenwood Rosiak 11/10/2015, 1:46 PM

## 2015-11-10 NOTE — Progress Notes (Signed)
[place] [facility]                                                                                                                                                                                            Patient Demographics   Onathan Fadness, is a 81 y.o. male, DOB - May 25, 1927, [id number]  Admit date - 11/05/2015   Admitting Physician Harrie Foreman, MD  Outpatient Primary MD for the patient is Olmedo, Guy Begin, MD   LOS - 5  Subjective: seen today.has constipation,no BM for 4 days.no other complaints,HR ,slightly low.but BP is stable.    Review of Systems:   CONSTITUTIONAL: No documented fever.  Positive fatigue and weakness. No weight gain, no weight loss.  EYES: No blurry or double vision.  ENT: No tinnitus. No postnasal drip. No redness of the oropharynx.  RESPIRATORY: No cough, no wheeze, no hemoptysis. Positive  dyspnea.  CARDIOVASCULAR: No chest pain. No orthopnea. No palpitations. No syncope.  GASTROINTESTINAL: No nausea, no vomiting or diarrhea. Right upper quadrant abdominal pain.does have constipation.  GENITOURINARY: No dysuria or hematuria.  ENDOCRINE: No polyuria or nocturia. No heat or cold intolerance.  HEMATOLOGY: No anemia. No bruising. No bleeding.  INTEGUMENTARY: No rashes. No lesions.  MUSCULOSKELETAL: No arthritis. No swelling. No gout.  NEUROLOGIC: No numbness, tingling, or ataxia. No seizure-type activity.  PSYCHIATRIC: No anxiety. No insomnia. No ADD.    Vitals:   Filed Vitals:   11/09/15 1119 11/09/15 2017 11/10/15 0544 11/10/15 1348  BP: 112/50 121/56 139/67 153/70  Pulse: 53 54 54 53  Temp: 97.9 F (36.6 C) 98.2 F (36.8 C) 98.4 F (36.9 C) 98.2 F (36.8 C)  TempSrc: Oral Oral Oral Oral  Resp: 16 16  17   Height:      Weight:      SpO2: 100% 93% 95% 96%    Wt Readings from Last 3 Encounters:  11/09/15 111.6 kg (246 lb 0.5 oz)  09/30/15 110.678 kg (244 lb)  07/29/15 107.049 kg (236  lb)     Intake/Output Summary (Last 24 hours) at 11/10/15 1350 Last data filed at 11/10/15 1216  Gross per 24 hour  Intake 6757.67 ml  Output    870 ml  Net 5887.67 ml    Physical Exam:   GENERAL: Pleasant-appearing in no apparent distress.  HEAD, EYES, EARS, NOSE AND THROAT: Atraumatic, normocephalic. Extraocular muscles are intact. Pupils equal and reactive to light. Sclerae anicteric. No conjunctival injection. No oro-pharyngeal erythema.  NECK: Supple. There is no jugular venous distention. No bruits, no lymphadenopathy, no thyromegaly.  HEART: Irregularly irregular heart and  rhythm,. No murmurs, no rubs, no clicks.  LUNGS: Clear to auscultation bilaterally. No rales or rhonchi. No wheezes.  ABDOMEN: Soft, flat, nontender, nondistended. Has good bowel sounds. No hepatosplenomegaly appreciated.  In Right upper quadrant ,cholecystostomy tube present draining serosanguineous fluid. EXTREMITIES: No evidence of any cyanosis, clubbing, or peripheral edema.  +2 pedal and radial pulses bilaterally.  NEUROLOGIC: The patient is alert, awake, and oriented x3 with no focal motor or sensory deficits appreciated bilaterally.  SKIN: Moist and warm with no rashes appreciated.  Psych: Not anxious, depressed LN: No inguinal LN enlargement    Antibiotics   Anti-infectives    Start     Dose/Rate Route Frequency Ordered Stop   11/08/15 1923  Ampicillin-Sulbactam (UNASYN) 3 g in sodium chloride 0.9 % 100 mL IVPB     3 g 100 mL/hr over 60 Minutes Intravenous Every 12 hours 11/08/15 0954     11/06/15 1300  Ampicillin-Sulbactam (UNASYN) 3 g in sodium chloride 0.9 % 100 mL IVPB  Status:  Discontinued     3 g 100 mL/hr over 60 Minutes Intravenous Every 6 hours 11/06/15 1203 11/08/15 0954      Medications   Scheduled Meds: . ampicillin-sulbactam (UNASYN) IV  3 g Intravenous Q12H  . docusate sodium  100 mg Oral BID  . FLUoxetine  20 mg Oral Daily  . insulin aspart  0-9 Units Subcutaneous Q6H  .  metoprolol  50 mg Oral BID  . pantoprazole (PROTONIX) IV  40 mg Intravenous Q12H  . pravastatin  20 mg Oral q1800  . sodium chloride  3 mL Intravenous Q12H  . tamsulosin  0.4 mg Oral QPC supper  . tiotropium  1 capsule Inhalation Daily   Continuous Infusions: . sodium chloride 100 mL/hr at 11/10/15 0128  . sodium chloride 20 mL/hr at 11/10/15 1347  . sodium chloride 50 mL/hr at 11/10/15 1139   PRN Meds:.acetaminophen **OR** acetaminophen, albuterol, hydrALAZINE, morphine injection, nitroGLYCERIN, ondansetron **OR** ondansetron (ZOFRAN) IV, oxyCODONE-acetaminophen, promethazine   Data Review:   Micro Results Recent Results (from the past 240 hour(s))  MRSA PCR Screening     Status: None   Collection Time: 11/06/15  3:15 AM  Result Value Ref Range Status   MRSA by PCR NEGATIVE NEGATIVE Final    Comment:        The GeneXpert MRSA Assay (FDA approved for NASAL specimens only), is one component of a comprehensive MRSA colonization surveillance program. It is not intended to diagnose MRSA infection nor to guide or monitor treatment for MRSA infections.   Culture, blood (routine x 2)     Status: None (Preliminary result)   Collection Time: 11/06/15 12:35 PM  Result Value Ref Range Status   Specimen Description BLOOD RIGHT ASSIST CONTROL  Final   Special Requests   Final    BOTTLES DRAWN AEROBIC AND ANAEROBIC  [place] ANAERO [place] AERO   Culture NO GROWTH 3 DAYS  Final   Report Status PENDING  Incomplete  Culture, blood (routine x 2)     Status: None (Preliminary result)   Collection Time: 11/06/15 12:38 PM  Result Value Ref Range Status   Specimen Description BLOOD LEFT HAND  Final   Special Requests BOTTLES DRAWN AEROBIC AND ANAEROBIC  3 CC  Final   Culture NO GROWTH 3 DAYS  Final   Report Status PENDING  Incomplete  Body fluid culture     Status: None   Collection Time: 11/07/15  3:40 PM  Result Value Ref Range Status  Specimen Description GALL BLADDER  Final   Special  Requests NONE  Final   Gram Stain   Final    RARE WBC SEEN MANY GRAM POSITIVE RODS MANY GRAM NEGATIVE RODS    Culture   Final    HEAVY GROWTH ESCHERICHIA COLI NO ANAEROBES ISOLATED    Report Status 11/10/2015 FINAL  Final   Organism ID, Bacteria ESCHERICHIA COLI  Final      Susceptibility   Escherichia coli - MIC*    AMPICILLIN <=2 SENSITIVE Sensitive     CEFTAZIDIME <=1 SENSITIVE Sensitive     CEFAZOLIN <=4 SENSITIVE Sensitive     CEFTRIAXONE <=1 SENSITIVE Sensitive     CIPROFLOXACIN <=0.25 SENSITIVE Sensitive     GENTAMICIN <=1 SENSITIVE Sensitive     IMIPENEM <=0.25 SENSITIVE Sensitive     TRIMETH/SULFA <=20 SENSITIVE Sensitive     PIP/TAZO Value in next row Sensitive      SENSITIVE<=4    * HEAVY GROWTH ESCHERICHIA COLI    Radiology Reports Dg Chest 1 View  11/06/2015  CLINICAL DATA:  Shortness of breath and weakness. EXAM: CHEST 1 VIEW COMPARISON:  11/05/2015. FINDINGS: Trachea is midline. Heart is at the upper limits of normal in size to mildly enlarged. Thoracic aorta is calcified. There is basilar interstitial prominence and indistinctness, new. Small left pleural effusion also new. IMPRESSION: New basilar dependent interstitial prominence/indistinctness and small left pleural effusion, favoring congestive heart failure. Electronically Signed   By: Lorin Picket M.D.   On: 11/06/2015 13:22   Dg Chest 2 View  11/05/2015  CLINICAL DATA:  Acute onset of mid chest pain.  Initial encounter. EXAM: CHEST  2 VIEW COMPARISON:  Chest radiograph performed 07/15/2015 FINDINGS: The lungs are well-aerated. Vascular congestion is noted. Increased interstitial markings raise concern for mild pulmonary edema, though pneumonia could have a similar appearance. There is no evidence of pleural effusion or pneumothorax. The heart is normal in size. The patient is status post median sternotomy, with evidence of prior CABG. No acute osseous abnormalities are seen. IMPRESSION: Vascular congestion  noted. Increased interstitial markings raise concern for mild pulmonary edema, though pneumonia could have a similar appearance. Electronically Signed   By: Garald Balding M.D.   On: 11/05/2015 03:55   Dg Abd 1 View  11/09/2015  CLINICAL DATA:  Abdominal distention status post gallbladder surgery yesterday, no bowel movement for 2 days EXAM: ABDOMEN - 1 VIEW COMPARISON:  None. FINDINGS: There is mild to moderate gaseous distention of the stomach. There is air in small and large bowel with no abnormally dilated loops of bowel. There is no significant fecal retention. Pigtail catheter right upper quadrant noted. IMPRESSION: Nonobstructive bowel gas pattern. Mild to moderate gaseous distention of the stomach likely related to postoperative ileus. Electronically Signed   By: Skipper Cliche M.D.   On: 11/09/2015 19:14   Ct Abdomen Pelvis W Contrast  11/06/2015  CLINICAL DATA:  Chest pain at rest. Nauseated. Intermittent symptoms for 4 weeks. Nausea and diarrhea over Thanksgiving. Decreased hemoglobin. EXAM: CT ABDOMEN AND PELVIS WITH CONTRAST TECHNIQUE: Multidetector CT imaging of the abdomen and pelvis was performed using the standard protocol following bolus administration of intravenous contrast. CONTRAST:  151mL OMNIPAQUE IOHEXOL 300 MG/ML  SOLN COMPARISON:  None FINDINGS: Lower chest: There is left lower lobe atelectasis. Coronary artery calcifications are present. Heart is mildly enlarged. No pericardial effusion. Upper abdomen: New no focal abnormality identified within the liver, spleen, pancreas, or right adrenal gland. Left adrenal adenoma measured/ 1.4  x 2.0 cm. There is symmetric enhancement of both kidneys. Small left lower pole cyst is identified. No hydronephrosis. The gallbladder is distended and has a thickened wall. There is significant pericholecystic inflammatory change. Small amount of gas is identified in the nondependent gallbladder fundus, suspicious for gas-forming organisms.  Gastrointestinal tract: The stomach and small bowel loops are normal in appearance. The appendix is normal in appearance. Numerous colonic diverticula are present. There is large stool burden, particularly within the rectosigmoid colon. Pelvis: Large prostate with prominent impression upon the bladder. Urinary bladder is normal in appearance. No free pelvic fluid. Retroperitoneum: Atherosclerotic calcification of the abdominal aorta and branches. No aneurysm. Abdominal wall: Small fat containing supraumbilical hernia Osseous structures: Significant lumbar spondylosis. No suspicious lytic or blastic lesions are identified. IMPRESSION: 1. Distended, thick-walled gallbladder with significant pericholecystic stranding. Gas within the gallbladder wall is suspicious for infection. Further evaluation with ultrasound is recommended. 2. Coronary artery disease. 3. Small left renal cyst. 4. Colonic diverticulosis.  Moderate stool burden. 5. Enlarged prostate. 6. Small fat containing supraumbilical hernia. Electronically Signed   By: Nolon Nations M.D.   On: 11/06/2015 14:05   Ct Image Guided Fluid Drain By Catheter  11/07/2015  CLINICAL DATA:  81 year old with acute cholecystitis. EXAM: CT-GUIDED CHOLECYSTOSTOMY TUBE PLACEMENT Physician: Stephan Minister. Anselm Pancoast, MD FLUOROSCOPY TIME:  None MEDICATIONS: 1 mg Versed, 25 mcg fentanyl. A radiology nurse monitored the patient for moderate sedation. ANESTHESIA/SEDATION: Moderate sedation time: 30 minutes PROCEDURE: The procedure was explained to the patient. The risks and benefits of the procedure were discussed and the patient's questions were addressed. Informed consent was obtained from the patient. The patient was placed supine on the CT scanner. Images through the abdomen were obtained. The right upper abdomen was prepped and draped in sterile fashion. Maximal barrier sterile technique was utilized including mask, sterile gowns, sterile gloves, sterile drape, hand hygiene and skin  antiseptic. The skin was anesthetized with 1% lidocaine. 18 gauge needle was directed into the gallbladder fundus with CT guidance. Stiff Amplatz wire was advanced into the gallbladder with CT guidance. The tract was dilated to accommodate a 10.2 [place] multipurpose drain. The drain was advanced into the gallbladder and 100 mL of cloudy brown fluid was aspirated. Catheter was sutured to skin and attached to gravity bag. FINDINGS: The gallbladder is distended with surrounding edema/ fluid and there is gas in the gallbladder fundus. Findings are consistent with acute cholecystitis. 100 mL of cloudy brown fluid was aspirated. Estimated blood loss: Minimal COMPLICATIONS: None IMPRESSION: Successful CT-guided cholecystostomy tube placement. Electronically Signed   By: Markus Daft M.D.   On: 11/07/2015 17:01   US Abdomen Limited Ruq  11/07/2015  CLINICAL DATA:  Right upper quadrant pain. EXAM: US ABDOMEN LIMITED - RIGHT UPPER QUADRANT COMPARISON:  CT 11/06/2015. FINDINGS: Gallbladder: Gallbladder is distended. Sludge is noted throughout the gallbladder. Gallbladder wall is thickened to 7 mm. Small amount of pericholecystic fluid noted. Positive Murphy sign. Findings are consistent with cholecystitis. Common bile duct: Diameter: 6 mm. Liver: No focal hepatic abnormality identified. Hepatic echotexture normal. Left hepatic lobe not well visualized due to overlying bowel gas. IMPRESSION: Distended gallbladder with sludge. Gallbladder wall thickening. Small amount of pericholecystic fluid. Positive Murphy sign. Findings consistent with gallbladder sludge with associated cholecystitis. No biliary ductal distention . Electronically Signed   By: Marcello Moores  Register   On: 11/07/2015 10:04     CBC  Recent Labs Lab 11/05/15 0319 11/05/15 1929 11/06/15 0409 11/07/15 0701 11/08/15 0515 11/09/15 0501  WBC 9.4  --  21.3* 26.3* 15.9* 12.4*  HGB 7.1* 8.8* 8.9* 8.7* 8.1* 7.5*  HCT 22.8*  --  28.3* 27.2* 25.3* 23.3*  PLT 362   --  340 335 313 274  MCV 79.9*  --  80.4 81.7 81.7 81.2  MCH 24.8*  --  25.3* 26.0 26.0 26.1  MCHC 31.0*  --  31.4* 31.8* 31.9* 32.1  RDW 15.3*  --  15.9* 16.4* 16.2* 16.6*    Chemistries   Recent Labs Lab 11/06/15 0409 11/06/15 1235 11/07/15 0701 11/08/15 0515 11/08/15 1257 11/10/15 0536  NA 138  --  137 138 136 135  K 3.7  --  4.4 4.3 4.1 4.0  CL 102  --  103 101 101 104  CO2 29  --  25 26 25 24   GLUCOSE 254*  --  148* 187* 177* 140*  BUN 20  --  40* 53* 59* 69*  CREATININE 1.05  --  2.34* 2.99* 3.31* 3.13*  CALCIUM 10.0  --  9.1 8.6* 8.6* 8.7*  AST  --  11*  --  54* 44*  --   ALT  --  10*  --  17 16*  --   ALKPHOS  --  51  --  53 52  --   BILITOT  --  1.2  --  0.5 0.6  --    ------------------------------------------------------------------------------------------------------------------ estimated creatinine clearance is 22.8 mL/min (by C-G formula based on Cr of 3.13). ------------------------------------------------------------------------------------------------------------------ No results for input(s): HGBA1C in the last 72 hours. ------------------------------------------------------------------------------------------------------------------ No results for input(s): CHOL, HDL, LDLCALC, TRIG, CHOLHDL, LDLDIRECT in the last 72 hours. ------------------------------------------------------------------------------------------------------------------ No results for input(s): TSH, T4TOTAL, T3FREE, THYROIDAB in the last 72 hours.  Invalid input(s): FREET3 ------------------------------------------------------------------------------------------------------------------ No results for input(s): VITAMINB12, FOLATE, FERRITIN, TIBC, IRON, RETICCTPCT in the last 72 hours.  Coagulation profile  Recent Labs Lab 11/07/15 1159  INR 1.39    No results for input(s): DDIMER in the last 72 hours.  Cardiac Enzymes  Recent Labs Lab 11/06/15 0409 11/06/15 1010  11/06/15 1547  TROPONINI 0.08* 0.05* 0.06*   ------------------------------------------------------------------------------------------------------------------ Invalid input(s): POCBNP    Assessment & Plan    This is an 81 year old Caucasian male with past medical history significant for coronary artery disease status post CABG and PCI now has a GI bleed and symptomatic anemia. 1. Hypotension: Due to sepsis likely gallbladder source. It is post ultrasound-guided cholecystotomy tube placed. Continue Unasyn. Improving. Continue liquids today. Severe sepsis with septic shock: With renal failure: On Unasyn, WBC improved. Cultures from gallbladder-fluid showed Escherichia coli. Continue Unasyn. Still with GB SOURCE .Better to cover anerobes, D/c home with augmentin    2. Atrial fibrillation with rapid ventricular rate;resolved.off amiodarone drip.amiodarone not given now due to bradycardia.contnue metoprolol but decrease to 25 mg bid due to tendency for bradycardia,  3. GI bleed: due to melena likely upper' ; continue protonix bid, hold Brilinta: Possible EGD today.npo now and clinicaly very dry,start gentle hydration,  4. Nausea vomiting: Due to acute cholecystitis continue Unasyn and follow CBC, continue Zofran. For acute cholecystitis patient had a cholecystostomy tube, draining serosanguineous fluid. Following the surgery possible cholecystectomy in 1-2 weeks.  5. Chest pain: Likely anginal secondary to anemia. Troponin is negative.  Cards consult appreciated they feel that this is not an MI. Recommend to proceed with the EGD  6. Acute on chronic systolic chf-Lasix on hold secondary to renal failure. Continue hydration. 7. Essential hypertension: Restarted metoprolol.   6. Diabetes mellitus type 2: Continue  sliding scale insulin   7. COPD: Stable. Continue inhalers per home regimen  7. Hyper Lipidemia: Continue statin therapy  8. DVT prophylaxis: SCDs  9. GI prophylaxis: IV  Protonix due to GI bleed  #10 acute blood loss anemia on chronic anemia status post transfusion, hemoglobin stable.  anemia is due to GI bleed.  #11, acute renal failure due to ATN with GI bleed, sepsis/hypotension; also could be BPH related;statrted on flomax  And has foley now  Discussed with the  Son and wife.    PT consult today.    Code Status Orders        Start     Ordered   11/05/15 [id number]  Full code   Continuous     11/05/15 0638           Consults gi, cards  DVT Prophylaxis   SCDs    Lab Results  Component Value Date   PLT 274 11/09/2015     Time Spent in minutes 35 minutes of critical care time spent  [facility] M.D on 11/10/2015 at 1:50 PM  Between 7am to 6pm - Pager - [phone]  After 6pm go to [email or web address] - password [email or web address] [place]   Office  [phone]

## 2015-11-10 NOTE — Anesthesia Procedure Notes (Signed)
Date/Time: 11/10/2015 2:24 PM Performed by: Johnna Acosta Pre-anesthesia Checklist: Patient identified, Emergency Drugs available, Suction available and Patient being monitored Patient Re-evaluated:Patient Re-evaluated prior to inductionOxygen Delivery Method: Nasal cannula

## 2015-11-10 NOTE — Progress Notes (Signed)
Dr Reece Levy notified of decreased HR. Order to hold metoprolol.

## 2015-11-10 NOTE — Progress Notes (Signed)
[facility]  ROUNDING NOTE   Subjective:  Pt seen at bedside.  Creatinine currently down to 3.13. However BUN up to 69. Had EGD today. Showed retained food contents.   Objective:  Vital signs in last 24 hours:  Temp:  [95.6 F (35.3 C)-98.4 F (36.9 C)] 97.3 F (36.3 C) (12/12 1437) Pulse Rate:  [46-63] 46 (12/12 1457) Resp:  [15-20] 19 (12/12 1457) BP: (107-153)/(43-73) 136/59 mmHg (12/12 1457) SpO2:  [91 %-96 %] 91 % (12/12 1457)  Weight change:  Filed Weights   11/07/15 0500 11/08/15 0444 11/09/15 0443  Weight: 104.5 kg (230 lb 6.1 oz) 108 kg (238 lb 1.6 oz) 111.6 kg (246 lb 0.5 oz)    Intake/Output: I/O last 3 completed shifts: In: 7701.7 [P.O.:200; I.V.:7361.7; Other:40; IV Piggyback:100] Out: 920 [Urine:850; Drains:70]   Intake/Output this shift:  Total I/O In: [id number] [I.V.:1156] Out: 370 [Urine:300; Drains:70]  Physical Exam: General: NAD, resting in bed  Head: Normocephalic, atraumatic. Moist oral mucosal membranes  Eyes: Anicteric  Neck: Supple, trachea midline  Lungs:  Clear to auscultation normal effort  Heart: S1S2 no rubs  Abdomen:  Soft, nontender, cholecystostomy tube in place  Extremities: no peripheral edema.  Neurologic: Nonfocal, moving all four extremities  Skin: No lesions       Basic Metabolic Panel:  Recent Labs Lab 11/06/15 0409 11/07/15 0701 11/08/15 0515 11/08/15 1257 11/10/15 0536  NA 138 137 138 136 135  K 3.7 4.4 4.3 4.1 4.0  CL 102 103 101 101 104  CO2 29 25 26 25 24   GLUCOSE 254* 148* 187* 177* 140*  BUN 20 40* 53* 59* 69*  CREATININE 1.05 2.34* 2.99* 3.31* 3.13*  CALCIUM 10.0 9.1 8.6* 8.6* 8.7*    Liver Function Tests:  Recent Labs Lab 11/06/15 1235 11/08/15 0515 11/08/15 1257  AST 11* 54* 44*  ALT 10* 17 16*  ALKPHOS 51 53 52  BILITOT 1.2 0.5 0.6  PROT 7.1 6.1* 6.4*  ALBUMIN 3.4* 2.8* 2.7*    Recent Labs Lab 11/06/15 1235  LIPASE 17   No results for input(s): AMMONIA in the last 168  hours.  CBC:  Recent Labs Lab 11/05/15 0319 11/05/15 1929 11/06/15 0409 11/07/15 0701 11/08/15 0515 11/09/15 0501  WBC 9.4  --  21.3* 26.3* 15.9* 12.4*  HGB 7.1* 8.8* 8.9* 8.7* 8.1* 7.5*  HCT 22.8*  --  28.3* 27.2* 25.3* 23.3*  MCV 79.9*  --  80.4 81.7 81.7 81.2  PLT 362  --  340 335 313 274    Cardiac Enzymes:  Recent Labs Lab 11/05/15 0929 11/05/15 1929 11/06/15 0409 11/06/15 1010 11/06/15 1547  TROPONINI 0.03 0.06* 0.08* 0.05* 0.06*    BNP: Invalid input(s): POCBNP  CBG:  Recent Labs Lab 11/09/15 1833 11/09/15 2119 11/10/15 0017 11/10/15 0652 11/10/15 1142  GLUCAP 191* 178* 151* 132* 141*    Microbiology: Results for orders placed or performed during the hospital encounter of 11/05/15  MRSA PCR Screening     Status: None   Collection Time: 11/06/15  3:15 AM  Result Value Ref Range Status   MRSA by PCR NEGATIVE NEGATIVE Final    Comment:        The GeneXpert MRSA Assay (FDA approved for NASAL specimens only), is one component of a comprehensive MRSA colonization surveillance program. It is not intended to diagnose MRSA infection nor to guide or monitor treatment for MRSA infections.   Culture, blood (routine x 2)     Status: None (Preliminary result)  Collection Time: 11/06/15 12:35 PM  Result Value Ref Range Status   Specimen Description BLOOD RIGHT ASSIST CONTROL  Final   Special Requests   Final    BOTTLES DRAWN AEROBIC AND ANAEROBIC  [place] ANAERO [place] AERO   Culture NO GROWTH 4 DAYS  Final   Report Status PENDING  Incomplete  Culture, blood (routine x 2)     Status: None (Preliminary result)   Collection Time: 11/06/15 12:38 PM  Result Value Ref Range Status   Specimen Description BLOOD LEFT HAND  Final   Special Requests BOTTLES DRAWN AEROBIC AND ANAEROBIC  3 CC  Final   Culture NO GROWTH 4 DAYS  Final   Report Status PENDING  Incomplete  Body fluid culture     Status: None   Collection Time: 11/07/15  3:40 PM  Result Value Ref  Range Status   Specimen Description GALL BLADDER  Final   Special Requests NONE  Final   Gram Stain   Final    RARE WBC SEEN MANY GRAM POSITIVE RODS MANY GRAM NEGATIVE RODS    Culture   Final    HEAVY GROWTH ESCHERICHIA COLI NO ANAEROBES ISOLATED    Report Status 11/10/2015 FINAL  Final   Organism ID, Bacteria ESCHERICHIA COLI  Final      Susceptibility   Escherichia coli - MIC*    AMPICILLIN <=2 SENSITIVE Sensitive     CEFTAZIDIME <=1 SENSITIVE Sensitive     CEFAZOLIN <=4 SENSITIVE Sensitive     CEFTRIAXONE <=1 SENSITIVE Sensitive     CIPROFLOXACIN <=0.25 SENSITIVE Sensitive     GENTAMICIN <=1 SENSITIVE Sensitive     IMIPENEM <=0.25 SENSITIVE Sensitive     TRIMETH/SULFA <=20 SENSITIVE Sensitive     PIP/TAZO Value in next row Sensitive      SENSITIVE<=4    * HEAVY GROWTH ESCHERICHIA COLI    Coagulation Studies: No results for input(s): LABPROT, INR in the last 72 hours.  Urinalysis: No results for input(s): COLORURINE, LABSPEC, PHURINE, GLUCOSEU, HGBUR, BILIRUBINUR, KETONESUR, PROTEINUR, UROBILINOGEN, NITRITE, LEUKOCYTESUR in the last 72 hours.  Invalid input(s): APPERANCEUR    Imaging: Dg Abd 1 View  11/09/2015  CLINICAL DATA:  Abdominal distention status post gallbladder surgery yesterday, no bowel movement for 2 days EXAM: ABDOMEN - 1 VIEW COMPARISON:  None. FINDINGS: There is mild to moderate gaseous distention of the stomach. There is air in small and large bowel with no abnormally dilated loops of bowel. There is no significant fecal retention. Pigtail catheter right upper quadrant noted. IMPRESSION: Nonobstructive bowel gas pattern. Mild to moderate gaseous distention of the stomach likely related to postoperative ileus. Electronically Signed   By: Skipper Cliche M.D.   On: 11/09/2015 19:14     Medications:   . sodium chloride 100 mL/hr at 11/10/15 0128  . sodium chloride 20 mL/hr at 11/10/15 1347  . sodium chloride 50 mL/hr at 11/10/15 1139   . [MAR Hold]  ampicillin-sulbactam (UNASYN) IV  3 g Intravenous Q12H  . [MAR Hold] docusate sodium  100 mg Oral BID  . [MAR Hold] FLUoxetine  20 mg Oral Daily  . [MAR Hold] insulin aspart  0-9 Units Subcutaneous Q6H  . ipratropium-albuterol  3 mL Nebulization Once  . ipratropium-albuterol      . metoprolol  25 mg Oral BID  . [MAR Hold] pantoprazole (PROTONIX) IV  40 mg Intravenous Q12H  . [MAR Hold] pravastatin  20 mg Oral q1800  . [MAR Hold] sodium chloride  3 mL Intravenous Q12H  . [  MAR Hold] tamsulosin  0.4 mg Oral QPC supper  . [MAR Hold] tiotropium  1 capsule Inhalation Daily   [MAR Hold] acetaminophen **OR** [MAR Hold] acetaminophen, [MAR Hold] albuterol, [MAR Hold] hydrALAZINE, [MAR Hold]  morphine injection, [MAR Hold] nitroGLYCERIN, [MAR Hold] ondansetron **OR** [MAR Hold] ondansetron (ZOFRAN) IV, [MAR Hold] oxyCODONE-acetaminophen, [MAR Hold] promethazine  Assessment/ Plan:  81 y.o. male male with congestive heart failure, hypertension, diabetes, coronary disease, CABG hyperlipidemia, peripheral neuropathy, COPD, who was admitted to [facility] on 11/05/2015 for evaluation of chest pain. Hospital course complicated by diagnosis of acute cholecystitis, status post placement of CT-guided cholecystostomy tube on December 9, acute renal failure  1. Acute renal failure. Baseline creatinine is normal at 1.05.  Exposed to contrast and also had acute cholecystitis. 2. BPH 3. Sepsis from acute cholecystitis 4. Atrial fibrillation with rapid ventricular response 5. Melena 6. Anemia unspecified.  Plan:   Creatinine slightly better however BUN a bit higher than before.  At this point in time we will maintain the patient on sodium chloride 100 cc per hour.  We will discontinue the other two orders for sodium chloride.  No acute indication for dialysis at present.  Continue to monitor renal function daily and avoid nephrotoxins as possible.  Patient also anemic and we would recommend continued monitoring of CBC.   Transfuse for hemoglobin of 7 or less.  We will continue to monitor progress along with you.  LOS: 5 Oziel Beitler 12/12/20163:06 PM

## 2015-11-10 NOTE — Progress Notes (Signed)
Initial Nutrition Assessment     INTERVENTION:  Meals and snacks: await diet progression following EGD Medical Nutrition Supplement Therapy: if unable to meet nutritional needs will add supplement   NUTRITION DIAGNOSIS:   Inadequate oral intake related to altered GI function as evidenced by NPO status.    GOAL:   Patient will meet greater than or equal to 90% of their needs    MONITOR:    (Energy intake, Digestive system)  REASON FOR ASSESSMENT:   NPO/Clear Liquid Diet    ASSESSMENT:      Pt admitted with abdominal distention, UGI bleeding, planning EGD this am, no BM in 4 days, afib, worsening renal function  Past Medical History  Diagnosis Date  . CHF (congestive heart failure) ([place])   . Hypertension   . Diabetes mellitus without complication ([place])   . Coronary artery disease   . Hyperlipidemia   . Deafness   . Peripheral neuropathy ([place])   . Back pain   . Umbilical hernia   . Depression   . Melena   . COPD (chronic obstructive pulmonary disease) ([place])   . Skin cancer     Current Nutrition: NPO at present but on regular diet on 12/10 since midnight.  Per family eating about 1/2 of solid food trays.  Food/Nutrition-Related History: Wife reports  good appetite prior to admission   Scheduled Medications:  . ampicillin-sulbactam (UNASYN) IV  3 g Intravenous Q12H  . bisacodyl  10 mg Rectal Once  . docusate sodium  100 mg Oral BID  . FLUoxetine  20 mg Oral Daily  . insulin aspart  0-9 Units Subcutaneous Q6H  . metoprolol  50 mg Oral BID  . pantoprazole (PROTONIX) IV  40 mg Intravenous Q12H  . pravastatin  20 mg Oral q1800  . sodium chloride  3 mL Intravenous Q12H  . tamsulosin  0.4 mg Oral QPC supper  . tiotropium  1 capsule Inhalation Daily    Continuous Medications:  . sodium chloride 100 mL/hr at 11/10/15 0128  . sodium chloride    . sodium chloride       Electrolyte/Renal Profile and Glucose Profile:   Recent Labs Lab 11/08/15 0515  11/08/15 1257 11/10/15 0536  NA 138 136 135  K 4.3 4.1 4.0  CL 101 101 104  CO2 26 25 24   BUN 53* 59* 69*  CREATININE 2.99* 3.31* 3.13*  CALCIUM 8.6* 8.6* 8.7*  GLUCOSE 187* 177* 140*   Protein Profile:  Recent Labs Lab 11/06/15 1235 11/08/15 0515 11/08/15 1257  ALBUMIN 3.4* 2.8* 2.7*    Gastrointestinal Profile: Last BM: 12/8   Nutrition-Focused Physical Exam Findings: Nutrition-Focused physical exam completed. Findings are WDL for fat depletion, muscle depletion, except for mild edema.     Weight Change: noted wt gain per wt encounters    Diet Order:  Diet NPO time specified  Skin:   reviewed    Height:   Ht Readings from Last 1 Encounters:  11/05/15 6\' 3"  (1.905 m)    Weight:   Wt Readings from Last 1 Encounters:  11/09/15 246 lb 0.5 oz (111.6 kg)    Ideal Body Weight:     BMI:  Body mass index is 30.75 kg/(m^2).  Estimated Nutritional Needs:   Kcal:  BEE 1875 kcals (IF 1.0-1.2-, AF 1.3) GI:463060 kcals/d.   Protein:  (1.0-1.3 g/kg) 111-144 g/d  Fluid:  (25-23ml/kg) 2775-3320ml/d  EDUCATION NEEDS:   No education needs identified at this time  [place].  Zenia Resides, [place], [place] (pager)

## 2015-11-10 NOTE — Anesthesia Preprocedure Evaluation (Signed)
Anesthesia Evaluation  Patient identified by MRN, date of birth, ID band Patient awake    Reviewed: Allergy & Precautions, H&P , NPO status , Patient's Chart, lab work & pertinent test results  History of Anesthesia Complications Negative for: history of anesthetic complications  Airway Mallampati: III  TM Distance: <3 FB Neck ROM: limited    Dental  (+) Poor Dentition, Missing   Pulmonary neg shortness of breath, COPD,  COPD inhaler and oxygen dependent, former smoker,    Pulmonary exam normal breath sounds clear to auscultation       Cardiovascular Exercise Tolerance: Poor hypertension, (-) angina+ CAD, + Cardiac Stents, +CHF and + DOE  Normal cardiovascular exam Rhythm:regular Rate:Normal     Neuro/Psych PSYCHIATRIC DISORDERS Depression  Neuromuscular disease    GI/Hepatic negative GI ROS, Neg liver ROS,   Endo/Other  diabetes, Type 2  Renal/GU negative Renal ROS  negative genitourinary   Musculoskeletal   Abdominal   Peds  Hematology negative hematology ROS (+)   Anesthesia Other Findings Past Medical History:   CHF (congestive heart failure) ([name])                         Hypertension                                                 Diabetes mellitus without complication ([name])                 Coronary artery disease                                      Hyperlipidemia                                               Deafness                                                     Peripheral neuropathy ([name])                                  Back pain                                                    Umbilical hernia                                             Depression  Melena                                                       COPD (chronic obstructive pulmonary disease) (*              Skin cancer                                                 Past Surgical  History:   CORONARY ARTERY BYPASS GRAFT                                  CARDIAC CATHETERIZATION                         Left 07/17/2015      Comment:Procedure: Right/Left Heart Cath and Coronary               Angiography;  Surgeon: [name], MD;                Location: [facility];  Service:               Cardiovascular;  Laterality: Left;   CARDIAC CATHETERIZATION                         N/A 07/17/2015      Comment:Procedure: Coronary Stent Intervention;                Surgeon: [name], MD;  Location: [facility];  Service: Cardiovascular;                Laterality: N/A;   CORONARY ANGIOPLASTY                                         BMI    Body Mass Index   30.75 kg/m 2      Reproductive/Obstetrics negative OB ROS                             Anesthesia Physical Anesthesia Plan  ASA: IV  Anesthesia Plan: General   Post-op Pain Management:    Induction:   Airway Management Planned:   Additional Equipment:   Intra-op Plan:   Post-operative Plan:   Informed Consent: I have reviewed the patients History and Physical, chart, labs and discussed the procedure including the risks, benefits and alternatives for the proposed anesthesia with the patient or authorized representative who has indicated his/her understanding and acceptance.   Dental Advisory Given  Plan Discussed with: Anesthesiologist, CRNA and Surgeon  Anesthesia Plan Comments:         Anesthesia Quick Evaluation

## 2015-11-11 LAB — GLUCOSE, CAPILLARY
Glucose-Capillary: 119 mg/dL — ABNORMAL HIGH (ref 65–99)
Glucose-Capillary: 139 mg/dL — ABNORMAL HIGH (ref 65–99)
Glucose-Capillary: 144 mg/dL — ABNORMAL HIGH (ref 65–99)
Glucose-Capillary: 132 mg/dL — ABNORMAL HIGH (ref 65–99)

## 2015-11-11 LAB — CBC
HCT: 24.9 % — ABNORMAL LOW (ref 40.0–52.0)
Hemoglobin: 7.9 g/dL — ABNORMAL LOW (ref 13.0–18.0)
MCH: 25.7 pg — AB (ref 26.0–34.0)
MCHC: 31.8 g/dL — AB (ref 32.0–36.0)
MCV: 81 fL (ref 80.0–100.0)
PLATELETS: 313 10*3/uL (ref 150–440)
RBC: 3.07 MIL/uL — AB (ref 4.40–5.90)
RDW: 16.4 % — AB (ref 11.5–14.5)
WBC: 10.2 10*3/uL (ref 3.8–10.6)

## 2015-11-11 LAB — BASIC METABOLIC PANEL
Anion gap: 5 (ref 5–15)
BUN: 56 mg/dL — ABNORMAL HIGH (ref 6–20)
CALCIUM: 9 mg/dL (ref 8.9–10.3)
CO2: 24 mmol/L (ref 22–32)
CREATININE: 2.05 mg/dL — AB (ref 0.61–1.24)
Chloride: 108 mmol/L (ref 101–111)
GFR, EST AFRICAN AMERICAN: 32 mL/min — AB (ref >60)
GFR, EST NON AFRICAN AMERICAN: 28 mL/min — AB (ref >60)
Glucose, Bld: 125 mg/dL — ABNORMAL HIGH (ref 65–99)
Potassium: 4.1 mmol/L (ref 3.5–5.1)
SODIUM: 137 mmol/L (ref 135–145)

## 2015-11-11 LAB — CULTURE, BLOOD (ROUTINE X 2)
CULTURE: NO GROWTH
Culture: NO GROWTH

## 2015-11-11 LAB — OCCULT BLOOD X 1 CARD TO LAB, STOOL: Fecal Occult Bld: POSITIVE — AB

## 2015-11-11 MED ORDER — SODIUM CHLORIDE 0.9 % IV SOLN
INTRAVENOUS | Status: DC
  Administered 2015-11-11 – 2015-11-12 (×2): via INTRAVENOUS

## 2015-11-11 MED ORDER — METOPROLOL TARTRATE 25 MG PO TABS
12.5000 mg | ORAL_TABLET | Freq: Two times a day (BID) | ORAL | Status: DC
  Administered 2015-11-11 – 2015-11-14 (×7): 12.5 mg via ORAL
  Filled 2015-11-11 (×7): qty 1

## 2015-11-11 MED ORDER — GLYBURIDE 2.5 MG PO TABS
2.5000 mg | ORAL_TABLET | Freq: Two times a day (BID) | ORAL | Status: DC
  Filled 2015-11-11 (×2): qty 1

## 2015-11-11 MED ORDER — SODIUM CHLORIDE 0.9 % IV SOLN
3.0000 g | Freq: Four times a day (QID) | INTRAVENOUS | Status: DC
  Administered 2015-11-11 – 2015-11-14 (×13): 3 g via INTRAVENOUS
  Filled 2015-11-11 (×18): qty 3

## 2015-11-11 NOTE — Progress Notes (Signed)
ANTIBIOTIC CONSULT NOTE- FOLLOW UP   Pharmacy Consult for ampicillin/sulbactam Indication: sepsis, suspected biliary source  No Known Allergies  Patient Measurements: Height: 6\' 3"  (190.5 cm) Weight: 250 lb 12.8 oz (113.762 kg) IBW/kg (Calculated) : 84.5  Vital Signs: Temp: 97.5 F (36.4 C) (12/13 0606) Temp Source: Oral (12/13 0606) BP: 150/62 mmHg (12/13 0606) Pulse Rate: 60 (12/13 0606)  Recent Labs  11/08/15 1257 11/09/15 0501 11/10/15 0536 11/11/15 0508 11/11/15 0509  WBC  --  12.4*  --  10.2  --   HGB  --  7.5*  --  7.9*  --   PLT  --  274  --  313  --   CREATININE 3.31*  --  3.13*  --  2.05*   Estimated Creatinine Clearance: 35.2 mL/min (by C-G formula based on Cr of 2.05).  Assessment: Pharmacy consulted to dose ampicillin/sulbactam for sepsis in this 81 year old male with acute renal failure. Patient acute cholecystitis, cholecystostomy tube placed 12/9. E. Coli in biliary fluid, covering for likely biliary source.  Currently day 6 of unasyn  Plan:  Renal function improving, est CrCl: 23ml/min. Current orders for unasyn 3gm IV Q12H, will change to Q6H based on renal function.  Pharmacy to follow per consult  Rexene Edison, PharmD Clinical Pharmacist  11/11/2015 9:44 AM

## 2015-11-11 NOTE — Progress Notes (Signed)
Pt up to [facility] for over an hour and unable to have a BM or urinate. Pt. complained of pain and fullness in bladder but unable to void. Bladder scan done and 765 ml detected in bladder. MD notified and order given for foley cath to be placed. After placement of foley 875mls output from bladder. Pt. noted relief.

## 2015-11-11 NOTE — Progress Notes (Addendum)
Change metoprolol dose to 12.5 BID discontinue previous dose of 25mg  per Dr. Vianne Bulls

## 2015-11-11 NOTE — Progress Notes (Signed)
AVSS. No complaints at this time, except minimal response from suppository. Reports prior to June 2016 he had daily BM's. More irregular since mid fall. No report of blood/ melanotic stools. Lungs: Clear. Cardio: RR. ABD: Distended, tympanitic, non-tender. Normal BS. Rectal: Formed, brown stool, sample sent for hemocult.   Labs: HGB up 0.4 to 7.9 gm. Creat down to 2.0.  Biliary drain: 155 cc. EGD yesterday: No source of bleeding. Plan: 1L tap water enema today.

## 2015-11-11 NOTE — Progress Notes (Signed)
[facility]  ROUNDING NOTE   Subjective:  Renal function has improved. Creatinine down to 2.05. Overall patient appears to be doing better. Urine output 1.5 L over the past 3 shifts.   Objective:  Vital signs in last 24 hours:  Temp:  [95.6 F (35.3 C)-98.5 F (36.9 C)] 97.5 F (36.4 C) (12/13 0606) Pulse Rate:  [46-65] 61 (12/13 1052) Resp:  [14-20] 16 (12/12 2122) BP: (107-161)/(43-73) 158/64 mmHg (12/13 1052) SpO2:  [91 %-98 %] 95 % (12/13 1052) Weight:  [113.762 kg (250 lb 12.8 oz)] 113.762 kg (250 lb 12.8 oz) (12/13 0606)  Weight change:  Filed Weights   11/08/15 0444 11/09/15 0443 11/11/15 0606  Weight: 108 kg (238 lb 1.6 oz) 111.6 kg (246 lb 0.5 oz) 113.762 kg (250 lb 12.8 oz)    Intake/Output: I/O last 3 completed shifts: In: 8828.3 [P.O.:240; I.V.:7788.3; Other:800] Out: 1655 [Urine:1500; Drains:155]   Intake/Output this shift:  Total I/O In: 501.7 [P.O.:240; I.V.:261.7] Out: 0   Physical Exam: General: NAD, resting in bed  Head: Normocephalic, atraumatic. Moist oral mucosal membranes  Eyes: Anicteric  Neck: Supple, trachea midline  Lungs:  Clear to auscultation normal effort  Heart: S1S2 no rubs  Abdomen:  Soft, nontender, cholecystostomy tube in place  Extremities: no peripheral edema.  Neurologic: Nonfocal, moving all four extremities  Skin: No lesions       Basic Metabolic Panel:  Recent Labs Lab 11/07/15 0701 11/08/15 0515 11/08/15 1257 11/10/15 0536 11/11/15 0509  NA 137 138 136 135 137  K 4.4 4.3 4.1 4.0 4.1  CL 103 101 101 104 108  CO2 25 26 25 24 24   GLUCOSE 148* 187* 177* 140* 125*  BUN 40* 53* 59* 69* 56*  CREATININE 2.34* 2.99* 3.31* 3.13* 2.05*  CALCIUM 9.1 8.6* 8.6* 8.7* 9.0    Liver Function Tests:  Recent Labs Lab 11/06/15 1235 11/08/15 0515 11/08/15 1257  AST 11* 54* 44*  ALT 10* 17 16*  ALKPHOS 51 53 52  BILITOT 1.2 0.5 0.6  PROT 7.1 6.1* 6.4*  ALBUMIN 3.4* 2.8* 2.7*    Recent Labs Lab  11/06/15 1235  LIPASE 17   No results for input(s): AMMONIA in the last 168 hours.  CBC:  Recent Labs Lab 11/06/15 0409 11/07/15 0701 11/08/15 0515 11/09/15 0501 11/11/15 0508  WBC 21.3* 26.3* 15.9* 12.4* 10.2  HGB 8.9* 8.7* 8.1* 7.5* 7.9*  HCT 28.3* 27.2* 25.3* 23.3* 24.9*  MCV 80.4 81.7 81.7 81.2 81.0  PLT 340 335 313 274 313    Cardiac Enzymes:  Recent Labs Lab 11/05/15 0929 11/05/15 1929 11/06/15 0409 11/06/15 1010 11/06/15 1547  TROPONINI 0.03 0.06* 0.08* 0.05* 0.06*    BNP: Invalid input(s): POCBNP  CBG:  Recent Labs Lab 11/10/15 0652 11/10/15 1142 11/10/15 1826 11/10/15 2124 11/11/15 0735  GLUCAP 132* 141* 138* 128* 119*    Microbiology: Results for orders placed or performed during the hospital encounter of 11/05/15  MRSA PCR Screening     Status: None   Collection Time: 11/06/15  3:15 AM  Result Value Ref Range Status   MRSA by PCR NEGATIVE NEGATIVE Final    Comment:        The GeneXpert MRSA Assay (FDA approved for NASAL specimens only), is one component of a comprehensive MRSA colonization surveillance program. It is not intended to diagnose MRSA infection nor to guide or monitor treatment for MRSA infections.   Culture, blood (routine x 2)     Status: None  Collection Time: 11/06/15 12:35 PM  Result Value Ref Range Status   Specimen Description BLOOD RIGHT ASSIST CONTROL  Final   Special Requests   Final    BOTTLES DRAWN AEROBIC AND ANAEROBIC  [place] ANAERO [place] AERO   Culture NO GROWTH 5 DAYS  Final   Report Status 11/11/2015 FINAL  Final  Culture, blood (routine x 2)     Status: None   Collection Time: 11/06/15 12:38 PM  Result Value Ref Range Status   Specimen Description BLOOD LEFT HAND  Final   Special Requests BOTTLES DRAWN AEROBIC AND ANAEROBIC  3 CC  Final   Culture NO GROWTH 5 DAYS  Final   Report Status 11/11/2015 FINAL  Final  Body fluid culture     Status: None   Collection Time: 11/07/15  3:40 PM  Result Value  Ref Range Status   Specimen Description GALL BLADDER  Final   Special Requests NONE  Final   Gram Stain   Final    RARE WBC SEEN MANY GRAM POSITIVE RODS MANY GRAM NEGATIVE RODS    Culture   Final    HEAVY GROWTH ESCHERICHIA COLI NO ANAEROBES ISOLATED    Report Status 11/10/2015 FINAL  Final   Organism ID, Bacteria ESCHERICHIA COLI  Final      Susceptibility   Escherichia coli - MIC*    AMPICILLIN <=2 SENSITIVE Sensitive     CEFTAZIDIME <=1 SENSITIVE Sensitive     CEFAZOLIN <=4 SENSITIVE Sensitive     CEFTRIAXONE <=1 SENSITIVE Sensitive     CIPROFLOXACIN <=0.25 SENSITIVE Sensitive     GENTAMICIN <=1 SENSITIVE Sensitive     IMIPENEM <=0.25 SENSITIVE Sensitive     TRIMETH/SULFA <=20 SENSITIVE Sensitive     PIP/TAZO Value in next row Sensitive      SENSITIVE<=4    * HEAVY GROWTH ESCHERICHIA COLI    Coagulation Studies: No results for input(s): LABPROT, INR in the last 72 hours.  Urinalysis: No results for input(s): COLORURINE, LABSPEC, PHURINE, GLUCOSEU, HGBUR, BILIRUBINUR, KETONESUR, PROTEINUR, UROBILINOGEN, NITRITE, LEUKOCYTESUR in the last 72 hours.  Invalid input(s): APPERANCEUR    Imaging: Dg Abd 1 View  11/09/2015  CLINICAL DATA:  Abdominal distention status post gallbladder surgery yesterday, no bowel movement for 2 days EXAM: ABDOMEN - 1 VIEW COMPARISON:  None. FINDINGS: There is mild to moderate gaseous distention of the stomach. There is air in small and large bowel with no abnormally dilated loops of bowel. There is no significant fecal retention. Pigtail catheter right upper quadrant noted. IMPRESSION: Nonobstructive bowel gas pattern. Mild to moderate gaseous distention of the stomach likely related to postoperative ileus. Electronically Signed   By: Skipper Cliche M.D.   On: 11/09/2015 19:14     Medications:     . ampicillin-sulbactam (UNASYN) IV  3 g Intravenous Q6H  . docusate sodium  100 mg Oral BID  . FLUoxetine  20 mg Oral Daily  . glyBURIDE  2.5 mg  Oral BID WC  . insulin aspart  0-5 Units Subcutaneous QHS  . insulin aspart  0-9 Units Subcutaneous TID WC  . metoprolol tartrate  12.5 mg Oral BID  . pantoprazole (PROTONIX) IV  40 mg Intravenous Q12H  . pravastatin  20 mg Oral q1800  . senna-docusate  1 tablet Oral QHS  . sodium chloride  3 mL Intravenous Q12H  . tamsulosin  0.4 mg Oral QPC supper  . tiotropium  1 capsule Inhalation Daily   acetaminophen **OR** acetaminophen, albuterol, hydrALAZINE, morphine injection, nitroGLYCERIN,  ondansetron **OR** ondansetron (ZOFRAN) IV, oxyCODONE-acetaminophen, promethazine  Assessment/ Plan:  81 y.o. male male with congestive heart failure, hypertension, diabetes, coronary disease, CABG hyperlipidemia, peripheral neuropathy, COPD, who was admitted to [facility] on 11/05/2015 for evaluation of chest pain. Hospital course complicated by diagnosis of acute cholecystitis, status post placement of CT-guided cholecystostomy tube on December 9, acute renal failure  1. Acute renal failure. Baseline creatinine is normal at 1.05.  Exposed to contrast and also had acute cholecystitis. 2. BPH 3. Sepsis from acute cholecystitis 4. Atrial fibrillation with rapid ventricular response 5. Melena 6. Anemia unspecified.  7.  Urinary retention.  Plan:   Renal function has improved significantly. Creatinine down to 2.05. Restart IVF hydration with 0.9 NS at 75cc/hr. patient apparently had some urinary retention last night as well. Therefore urology consultation ordered for this. Continue to monitor renal function during the course of his hospitalization. Avoid nephrotoxins including any further contrast administration.    LOS: 6 Hadley Detloff 12/13/201611:14 AM

## 2015-11-11 NOTE — Progress Notes (Addendum)
[place] at [facility]                                                                                                                                                                                            Patient Demographics   Justin Leonard, is a 81 y.o. male, DOB - [birth year], [id number]  Admit date - 11/05/2015   Admitting Physician Harrie Foreman, MD  Outpatient Primary MD for the patient is Olmedo, Guy Begin, MD   LOS - 6  Subjective: seen today.has constipation,no BM for 4 days. Digital exam done by Dr. Fleet Contras.. Stool sent for guiac. Urinary retention last night, foley  was inserted.  Review of Systems:   CONSTITUTIONAL: No documented fever.  Positive fatigue and weakness. No weight gain, no weight loss.  EYES: No blurry or double vision.  ENT: No tinnitus. No postnasal drip. No redness of the oropharynx.  RESPIRATORY: No cough, no wheeze, no hemoptysis. Positive  dyspnea.  CARDIOVASCULAR: No chest pain. No orthopnea. No palpitations. No syncope.  GASTROINTESTINAL: No nausea, no vomiting or diarrhea. Right upper quadrant abdominal pain.does have constipation.  GENITOURINARY: No dysuria or hematuria.  ENDOCRINE: No polyuria or nocturia. No heat or cold intolerance.  HEMATOLOGY: No anemia. No bruising. No bleeding.  INTEGUMENTARY: No rashes. No lesions.  MUSCULOSKELETAL: No arthritis. No swelling. No gout.  NEUROLOGIC: No numbness, tingling, or ataxia. No seizure-type activity.  PSYCHIATRIC: No anxiety. No insomnia. No ADD.    Vitals:   Filed Vitals:   11/10/15 1546 11/10/15 1820 11/10/15 2122 11/11/15 0606  BP: 161/67 149/66 142/57 150/62  Pulse: 65 64 64 60  Temp: 98.5 F (36.9 C)  97.9 F (36.6 C) 97.5 F (36.4 C)  TempSrc: Oral  Oral Oral  Resp: 14  16   Height:      Weight:    113.762 kg (250 lb 12.8 oz)  SpO2: 94%  97% 98%    Wt Readings from Last 3 Encounters:  11/11/15 113.762 kg (250 lb 12.8 oz)   09/30/15 110.678 kg (244 lb)  07/29/15 107.049 kg (236 lb)     Intake/Output Summary (Last 24 hours) at 11/11/15 1016 Last data filed at 11/11/15 0730  Gross per 24 hour  Intake   3256 ml  Output   1165 ml  Net   2091 ml    Physical Exam:   GENERAL: Pleasant-appearing in no apparent distress.  HEAD, EYES, EARS, NOSE AND THROAT: Atraumatic, normocephalic. Extraocular muscles are intact. Pupils equal and reactive to light. Sclerae anicteric. No conjunctival injection. No oro-pharyngeal erythema.  NECK: Supple. There is no jugular  venous distention. No bruits, no lymphadenopathy, no thyromegaly.  HEART: Irregularly irregular heart and rhythm,. No murmurs, no rubs, no clicks.  LUNGS: Clear to auscultation bilaterally. No rales or rhonchi. No wheezes.  ABDOMEN: Soft, flat, nontender, nondistended. Has good bowel sounds. No hepatosplenomegaly appreciated.  In Right upper quadrant ,cholecystostomy tube present draining serosanguineous fluid. EXTREMITIES: No evidence of any cyanosis, clubbing, or peripheral edema.  +2 pedal and radial pulses bilaterally.  NEUROLOGIC: The patient is alert, awake, and oriented x3 with no focal motor or sensory deficits appreciated bilaterally.  SKIN: Moist and warm with no rashes appreciated.  Psych: Not anxious, depressed LN: No inguinal LN enlargement    Antibiotics   Anti-infectives    Start     Dose/Rate Route Frequency Ordered Stop   11/11/15 1000  Ampicillin-Sulbactam (UNASYN) 3 g in sodium chloride 0.9 % 100 mL IVPB     3 g 100 mL/hr over 60 Minutes Intravenous Every 6 hours 11/11/15 0950     11/08/15 1923  Ampicillin-Sulbactam (UNASYN) 3 g in sodium chloride 0.9 % 100 mL IVPB  Status:  Discontinued     3 g 100 mL/hr over 60 Minutes Intravenous Every 12 hours 11/08/15 0954 11/11/15 0950   11/06/15 1300  Ampicillin-Sulbactam (UNASYN) 3 g in sodium chloride 0.9 % 100 mL IVPB  Status:  Discontinued     3 g 100 mL/hr over 60 Minutes Intravenous  Every 6 hours 11/06/15 1203 11/08/15 0954      Medications   Scheduled Meds: . ampicillin-sulbactam (UNASYN) IV  3 g Intravenous Q6H  . docusate sodium  100 mg Oral BID  . FLUoxetine  20 mg Oral Daily  . insulin aspart  0-5 Units Subcutaneous QHS  . insulin aspart  0-9 Units Subcutaneous TID WC  . metoprolol tartrate  12.5 mg Oral BID  . pantoprazole (PROTONIX) IV  40 mg Intravenous Q12H  . pravastatin  20 mg Oral q1800  . senna-docusate  1 tablet Oral QHS  . sodium chloride  3 mL Intravenous Q12H  . tamsulosin  0.4 mg Oral QPC supper  . tiotropium  1 capsule Inhalation Daily   Continuous Infusions: . sodium chloride 100 mL/hr at 11/11/15 0453   PRN Meds:.acetaminophen **OR** acetaminophen, albuterol, hydrALAZINE, morphine injection, nitroGLYCERIN, ondansetron **OR** ondansetron (ZOFRAN) IV, oxyCODONE-acetaminophen, promethazine   Data Review:   Micro Results Recent Results (from the past 240 hour(s))  MRSA PCR Screening     Status: None   Collection Time: 11/06/15  3:15 AM  Result Value Ref Range Status   MRSA by PCR NEGATIVE NEGATIVE Final    Comment:        The GeneXpert MRSA Assay (FDA approved for NASAL specimens only), is one component of a comprehensive MRSA colonization surveillance program. It is not intended to diagnose MRSA infection nor to guide or monitor treatment for MRSA infections.   Culture, blood (routine x 2)     Status: None   Collection Time: 11/06/15 12:35 PM  Result Value Ref Range Status   Specimen Description BLOOD RIGHT ASSIST CONTROL  Final   Special Requests   Final    BOTTLES DRAWN AEROBIC AND ANAEROBIC  [place] ANAERO [place] AERO   Culture NO GROWTH 5 DAYS  Final   Report Status 11/11/2015 FINAL  Final  Culture, blood (routine x 2)     Status: None   Collection Time: 11/06/15 12:38 PM  Result Value Ref Range Status   Specimen Description BLOOD LEFT HAND  Final   Special  Requests BOTTLES DRAWN AEROBIC AND ANAEROBIC  3 CC  Final    Culture NO GROWTH 5 DAYS  Final   Report Status 11/11/2015 FINAL  Final  Body fluid culture     Status: None   Collection Time: 11/07/15  3:40 PM  Result Value Ref Range Status   Specimen Description GALL BLADDER  Final   Special Requests NONE  Final   Gram Stain   Final    RARE WBC SEEN MANY GRAM POSITIVE RODS MANY GRAM NEGATIVE RODS    Culture   Final    HEAVY GROWTH ESCHERICHIA COLI NO ANAEROBES ISOLATED    Report Status 11/10/2015 FINAL  Final   Organism ID, Bacteria ESCHERICHIA COLI  Final      Susceptibility   Escherichia coli - MIC*    AMPICILLIN <=2 SENSITIVE Sensitive     CEFTAZIDIME <=1 SENSITIVE Sensitive     CEFAZOLIN <=4 SENSITIVE Sensitive     CEFTRIAXONE <=1 SENSITIVE Sensitive     CIPROFLOXACIN <=0.25 SENSITIVE Sensitive     GENTAMICIN <=1 SENSITIVE Sensitive     IMIPENEM <=0.25 SENSITIVE Sensitive     TRIMETH/SULFA <=20 SENSITIVE Sensitive     PIP/TAZO Value in next row Sensitive      SENSITIVE<=4    * HEAVY GROWTH ESCHERICHIA COLI    Radiology Reports Dg Chest 1 View  11/06/2015  CLINICAL DATA:  Shortness of breath and weakness. EXAM: CHEST 1 VIEW COMPARISON:  11/05/2015. FINDINGS: Trachea is midline. Heart is at the upper limits of normal in size to mildly enlarged. Thoracic aorta is calcified. There is basilar interstitial prominence and indistinctness, new. Small left pleural effusion also new. IMPRESSION: New basilar dependent interstitial prominence/indistinctness and small left pleural effusion, favoring congestive heart failure. Electronically Signed   By: Lorin Picket M.D.   On: 11/06/2015 13:22   Dg Chest 2 View  11/05/2015  CLINICAL DATA:  Acute onset of mid chest pain.  Initial encounter. EXAM: CHEST  2 VIEW COMPARISON:  Chest radiograph performed 07/15/2015 FINDINGS: The lungs are well-aerated. Vascular congestion is noted. Increased interstitial markings raise concern for mild pulmonary edema, though pneumonia could have a similar appearance.  There is no evidence of pleural effusion or pneumothorax. The heart is normal in size. The patient is status post median sternotomy, with evidence of prior CABG. No acute osseous abnormalities are seen. IMPRESSION: Vascular congestion noted. Increased interstitial markings raise concern for mild pulmonary edema, though pneumonia could have a similar appearance. Electronically Signed   By: Garald Balding M.D.   On: 11/05/2015 03:55   Dg Abd 1 View  11/09/2015  CLINICAL DATA:  Abdominal distention status post gallbladder surgery yesterday, no bowel movement for 2 days EXAM: ABDOMEN - 1 VIEW COMPARISON:  None. FINDINGS: There is mild to moderate gaseous distention of the stomach. There is air in small and large bowel with no abnormally dilated loops of bowel. There is no significant fecal retention. Pigtail catheter right upper quadrant noted. IMPRESSION: Nonobstructive bowel gas pattern. Mild to moderate gaseous distention of the stomach likely related to postoperative ileus. Electronically Signed   By: Skipper Cliche M.D.   On: 11/09/2015 19:14   Ct Abdomen Pelvis W Contrast  11/06/2015  CLINICAL DATA:  Chest pain at rest. Nauseated. Intermittent symptoms for 4 weeks. Nausea and diarrhea over Thanksgiving. Decreased hemoglobin. EXAM: CT ABDOMEN AND PELVIS WITH CONTRAST TECHNIQUE: Multidetector CT imaging of the abdomen and pelvis was performed using the standard protocol following bolus administration of intravenous contrast.  CONTRAST:  161mL OMNIPAQUE IOHEXOL 300 MG/ML  SOLN COMPARISON:  None FINDINGS: Lower chest: There is left lower lobe atelectasis. Coronary artery calcifications are present. Heart is mildly enlarged. No pericardial effusion. Upper abdomen: New no focal abnormality identified within the liver, spleen, pancreas, or right adrenal gland. Left adrenal adenoma measured/ 1.4 x 2.0 cm. There is symmetric enhancement of both kidneys. Small left lower pole cyst is identified. No hydronephrosis.  The gallbladder is distended and has a thickened wall. There is significant pericholecystic inflammatory change. Small amount of gas is identified in the nondependent gallbladder fundus, suspicious for gas-forming organisms. Gastrointestinal tract: The stomach and small bowel loops are normal in appearance. The appendix is normal in appearance. Numerous colonic diverticula are present. There is large stool burden, particularly within the rectosigmoid colon. Pelvis: Large prostate with prominent impression upon the bladder. Urinary bladder is normal in appearance. No free pelvic fluid. Retroperitoneum: Atherosclerotic calcification of the abdominal aorta and branches. No aneurysm. Abdominal wall: Small fat containing supraumbilical hernia Osseous structures: Significant lumbar spondylosis. No suspicious lytic or blastic lesions are identified. IMPRESSION: 1. Distended, thick-walled gallbladder with significant pericholecystic stranding. Gas within the gallbladder wall is suspicious for infection. Further evaluation with ultrasound is recommended. 2. Coronary artery disease. 3. Small left renal cyst. 4. Colonic diverticulosis.  Moderate stool burden. 5. Enlarged prostate. 6. Small fat containing supraumbilical hernia. Electronically Signed   By: Nolon Nations M.D.   On: 11/06/2015 14:05   Ct Image Guided Fluid Drain By Catheter  11/07/2015  CLINICAL DATA:  81 year old with acute cholecystitis. EXAM: CT-GUIDED CHOLECYSTOSTOMY TUBE PLACEMENT Physician: Stephan Minister. Anselm Pancoast, MD FLUOROSCOPY TIME:  None MEDICATIONS: 1 mg Versed, 25 mcg fentanyl. A radiology nurse monitored the patient for moderate sedation. ANESTHESIA/SEDATION: Moderate sedation time: 30 minutes PROCEDURE: The procedure was explained to the patient. The risks and benefits of the procedure were discussed and the patient's questions were addressed. Informed consent was obtained from the patient. The patient was placed supine on the CT scanner. Images through  the abdomen were obtained. The right upper abdomen was prepped and draped in sterile fashion. Maximal barrier sterile technique was utilized including mask, sterile gowns, sterile gloves, sterile drape, hand hygiene and skin antiseptic. The skin was anesthetized with 1% lidocaine. 18 gauge needle was directed into the gallbladder fundus with CT guidance. Stiff Amplatz wire was advanced into the gallbladder with CT guidance. The tract was dilated to accommodate a 10.2 [place] multipurpose drain. The drain was advanced into the gallbladder and 100 mL of cloudy brown fluid was aspirated. Catheter was sutured to skin and attached to gravity bag. FINDINGS: The gallbladder is distended with surrounding edema/ fluid and there is gas in the gallbladder fundus. Findings are consistent with acute cholecystitis. 100 mL of cloudy brown fluid was aspirated. Estimated blood loss: Minimal COMPLICATIONS: None IMPRESSION: Successful CT-guided cholecystostomy tube placement. Electronically Signed   By: Markus Daft M.D.   On: 11/07/2015 17:01   US Abdomen Limited Ruq  11/07/2015  CLINICAL DATA:  Right upper quadrant pain. EXAM: US ABDOMEN LIMITED - RIGHT UPPER QUADRANT COMPARISON:  CT 11/06/2015. FINDINGS: Gallbladder: Gallbladder is distended. Sludge is noted throughout the gallbladder. Gallbladder wall is thickened to 7 mm. Small amount of pericholecystic fluid noted. Positive Murphy sign. Findings are consistent with cholecystitis. Common bile duct: Diameter: 6 mm. Liver: No focal hepatic abnormality identified. Hepatic echotexture normal. Left hepatic lobe not well visualized due to overlying bowel gas. IMPRESSION: Distended gallbladder with sludge. Gallbladder wall thickening. Small  amount of pericholecystic fluid. Positive Murphy sign. Findings consistent with gallbladder sludge with associated cholecystitis. No biliary ductal distention . Electronically Signed   By: Marcello Moores  Register   On: 11/07/2015 10:04     CBC  Recent  Labs Lab 11/06/15 0409 11/07/15 0701 11/08/15 0515 11/09/15 0501 11/11/15 0508  WBC 21.3* 26.3* 15.9* 12.4* 10.2  HGB 8.9* 8.7* 8.1* 7.5* 7.9*  HCT 28.3* 27.2* 25.3* 23.3* 24.9*  PLT 340 335 313 274 313  MCV 80.4 81.7 81.7 81.2 81.0  MCH 25.3* 26.0 26.0 26.1 25.7*  MCHC 31.4* 31.8* 31.9* 32.1 31.8*  RDW 15.9* 16.4* 16.2* 16.6* 16.4*    Chemistries   Recent Labs Lab 11/06/15 1235 11/07/15 0701 11/08/15 0515 11/08/15 1257 11/10/15 0536 11/11/15 0509  NA  --  137 138 136 135 137  K  --  4.4 4.3 4.1 4.0 4.1  CL  --  103 101 101 104 108  CO2  --  25 26 25 24 24   GLUCOSE  --  148* 187* 177* 140* 125*  BUN  --  40* 53* 59* 69* 56*  CREATININE  --  2.34* 2.99* 3.31* 3.13* 2.05*  CALCIUM  --  9.1 8.6* 8.6* 8.7* 9.0  AST 11*  --  54* 44*  --   --   ALT 10*  --  17 16*  --   --   ALKPHOS 51  --  53 52  --   --   BILITOT 1.2  --  0.5 0.6  --   --    ------------------------------------------------------------------------------------------------------------------ estimated creatinine clearance is 35.2 mL/min (by C-G formula based on Cr of 2.05). ------------------------------------------------------------------------------------------------------------------ No results for input(s): HGBA1C in the last 72 hours. ------------------------------------------------------------------------------------------------------------------ No results for input(s): CHOL, HDL, LDLCALC, TRIG, CHOLHDL, LDLDIRECT in the last 72 hours. ------------------------------------------------------------------------------------------------------------------ No results for input(s): TSH, T4TOTAL, T3FREE, THYROIDAB in the last 72 hours.  Invalid input(s): FREET3 ------------------------------------------------------------------------------------------------------------------ No results for input(s): VITAMINB12, FOLATE, FERRITIN, TIBC, IRON, RETICCTPCT in the last 72 hours.  Coagulation profile  Recent  Labs Lab 11/07/15 1159  INR 1.39    No results for input(s): DDIMER in the last 72 hours.  Cardiac Enzymes  Recent Labs Lab 11/06/15 0409 11/06/15 1010 11/06/15 1547  TROPONINI 0.08* 0.05* 0.06*   ------------------------------------------------------------------------------------------------------------------ Invalid input(s): POCBNP    Assessment & Plan    This is an 81 year old Caucasian male with past medical history significant for coronary artery disease status post CABG and PCI now has a GI bleed and symptomatic anemia.  1. Hypotension: Due to sepsis likely gallbladder source. It is post ultrasound-guided cholecystotomy tube placed. Continue Unasyn. Improving. Tolerating the diet.  Severe sepsis with septic shock: With renal failure: On Unasyn, WBC improved. Cultures from gallbladder-fluid showed Escherichia coli. Continue Unasyn. Still with GB SOURCE .Better to cover anerobes, can D/c home with augmentin ,DC IV fluids. Wbc normalized.   2. Atrial fibrillation with rapid ventricular rate;resolved.off amiodarone drip.amiodarone not given now due to bradycardia.  Metoprolol dose is adjusted to prevent bradycardia episodes.  3.obscure  GI bleed hold Brilinta: EGD did not show any ulcers. Check the stool for guaiac. Hemoglobin is stable. His hemoglobin further stays stable without further evidence of melena , we need to restart the Brilinta I will talk to cardiology. I spoke with the patient and son. About this.   4. Nausea vomiting: resolved. Due to acute cholecystitis continue Unasyn , continue Zofran.  For acute cholecystitis patient had a cholecystostomy tube, continue this tube, patient will go  home with this. Outpatient cholecystectomy.  5. Chest pain: Likely anginal secondary to anemia. Troponin is negative.  Cards consult appreciated they feel that this is not an MI.  6. Acute on chronic systolic chf-Lasix on hold secondary to renal failure.   7. Essential  hypertension: Restarted metoprolol.   6. Diabetes mellitus type 2: Continue sliding scale insulin   7. COPD: Stable. Continue inhalers per home regimen  7. Hyper Lipidemia: Continue statin therapy  8. DVT prophylaxis: SCDs  9. GI prophylaxis: IV Protonix due to GI bleed  #10 acute blood loss anemia on chronic anemia status post transfusion, hemoglobin stable.  anemia is due to GI bleed.  #11, acute renal failure due to postobstructive uropathy with BPH: He is inserted, Flomax started. Urinary retention up to 900 mL last night. Consult urology.  Discussed with the  Son and wife. #12 constipation continue Dulcolax, Colace,Enema..   PT consult today.    Code Status Orders        Start     Ordered   11/05/15 [id number]  Full code   Continuous     11/05/15 0638           Consults gi, cards  DVT Prophylaxis   SCDs    Lab Results  Component Value Date   PLT 313 11/11/2015     Time Spent in minutes 35 minutes of critical care time spent  [facility] M.D on 11/11/2015 at 10:16 AM  Between 7am to 6pm - Pager - [phone]  After 6pm go to [email or web address] - password [email or web address] [facility]   Office  [phone] Son is very concerned about guiac positive stool.and also wants to see if we can tallk to cardiology to restart Brillinta, I spoke to Dr.Rein on call for Dr.OH,recomends speaking to Dr.OH tomorrow,I called Dr.OH but he is not on call,he said guaic positive stool is not a good indicator for GI bled.,not useful test. Spoke with Dr.Fath about pt and restarting Brillinta.he said he will se the pt tomorrow,

## 2015-11-11 NOTE — Progress Notes (Signed)
Spoke to Dr. Vianne Bulls okay to place PT order as well as urology consult.

## 2015-11-12 ENCOUNTER — Telehealth: Payer: Self-pay

## 2015-11-12 ENCOUNTER — Encounter: Payer: Self-pay | Admitting: Gastroenterology

## 2015-11-12 DIAGNOSIS — K922 Gastrointestinal hemorrhage, unspecified: Secondary | ICD-10-CM | POA: Insufficient documentation

## 2015-11-12 DIAGNOSIS — R338 Other retention of urine: Secondary | ICD-10-CM

## 2015-11-12 DIAGNOSIS — N401 Enlarged prostate with lower urinary tract symptoms: Secondary | ICD-10-CM

## 2015-11-12 DIAGNOSIS — N179 Acute kidney failure, unspecified: Secondary | ICD-10-CM

## 2015-11-12 LAB — BASIC METABOLIC PANEL
Anion gap: 7 (ref 5–15)
BUN: 33 mg/dL — AB (ref 6–20)
CHLORIDE: 107 mmol/L (ref 101–111)
CO2: 26 mmol/L (ref 22–32)
Calcium: 9.4 mg/dL (ref 8.9–10.3)
Creatinine, Ser: 1.25 mg/dL — ABNORMAL HIGH (ref 0.61–1.24)
GFR calc Af Amer: 58 mL/min — ABNORMAL LOW (ref >60)
GFR calc non Af Amer: 50 mL/min — ABNORMAL LOW (ref >60)
GLUCOSE: 139 mg/dL — AB (ref 65–99)
POTASSIUM: 4.5 mmol/L (ref 3.5–5.1)
Sodium: 140 mmol/L (ref 135–145)

## 2015-11-12 LAB — CBC
HCT: 25.8 % — ABNORMAL LOW (ref 40.0–52.0)
HEMOGLOBIN: 8 g/dL — AB (ref 13.0–18.0)
MCH: 25.3 pg — AB (ref 26.0–34.0)
MCHC: 30.9 g/dL — ABNORMAL LOW (ref 32.0–36.0)
MCV: 81.8 fL (ref 80.0–100.0)
PLATELETS: 333 10*3/uL (ref 150–440)
RBC: 3.16 MIL/uL — ABNORMAL LOW (ref 4.40–5.90)
RDW: 16.1 % — ABNORMAL HIGH (ref 11.5–14.5)
WBC: 9.6 10*3/uL (ref 3.8–10.6)

## 2015-11-12 LAB — GLUCOSE, CAPILLARY
Glucose-Capillary: 114 mg/dL — ABNORMAL HIGH (ref 65–99)
Glucose-Capillary: 127 mg/dL — ABNORMAL HIGH (ref 65–99)
Glucose-Capillary: 150 mg/dL — ABNORMAL HIGH (ref 65–99)
Glucose-Capillary: 145 mg/dL — ABNORMAL HIGH (ref 65–99)

## 2015-11-12 MED ORDER — SENNOSIDES-DOCUSATE SODIUM 8.6-50 MG PO TABS
2.0000 | ORAL_TABLET | Freq: Two times a day (BID) | ORAL | Status: DC
  Administered 2015-11-12 – 2015-11-14 (×4): 2 via ORAL
  Filled 2015-11-12 (×4): qty 2

## 2015-11-12 MED ORDER — BISACODYL 10 MG RE SUPP: 10.0000 mg | Freq: Once | RECTAL | Status: DC

## 2015-11-12 MED ORDER — IPRATROPIUM-ALBUTEROL 0.5-2.5 (3) MG/3ML IN SOLN
3.0000 mL | RESPIRATORY_TRACT | Status: DC
  Administered 2015-11-12 (×3): 3 mL via RESPIRATORY_TRACT
  Filled 2015-11-12 (×5): qty 3

## 2015-11-12 NOTE — Evaluation (Signed)
Physical Therapy Evaluation Patient Details Name: Justin Leonard MRN: [id number] DOB: 06-15-1927 Today's Date: 11/12/2015   History of Present Illness  presented to ER seconadry to chest pain, dark/tarry stools; admitted secondary to GIB, course complicated by sepsis related to acalculous cholecystitis (requiring transfer to CCU and placement of cholecystecomy tube 12/9)  Clinical Impression  Upon evaluation, patient alert and oriented, follows all commands and demonstrates fair insight/safety awareness.  Demonstrates strength and ROM grossly symmetrical and WFL; denies pain at this time.  Currently completing bed mobility with mod indep; sit/stand, basic transfers and gait (40') with RW, cga.  Decreased dynamic balance, decreased activity tolerance noted; however, patient motivated to participate/progress as tolerated.  Vitals stable and WFL throughout. Would benefit from skilled PT to address above deficits and promote optimal return to PLOF; Recommend transition to HHPT and 24 hour sup/assist upon discharge from acute hospitalization.     Follow Up Recommendations Home health PT;Supervision/Assistance - 24 hour    Equipment Recommendations  Rolling walker with 5" wheels    Recommendations for Other Services       Precautions / Restrictions Precautions Precautions: Fall Precaution Comments: R cholecystectomy tube Restrictions Weight Bearing Restrictions: No      Mobility  Bed Mobility Overal bed mobility: Modified Independent                Transfers Overall transfer level: Needs assistance Equipment used: Rolling walker (2 wheeled) Transfers: Sit to/from Stand Sit to Stand: Min assist            Ambulation/Gait Ambulation/Gait assistance: Min assist Ambulation Distance (Feet): 40 Feet Assistive device: Rolling walker (2 wheeled)       General Gait Details: reciprocal stepping pattern with decreased step height/length, decreased gait speed; mod reliance  on RW for external stabilization.  Maintains sats >93% on 2L with exertion, BORG 5/10  Stairs            Wheelchair Mobility    Modified Rankin (Stroke Patients Only)       Balance Overall balance assessment: Needs assistance Sitting-balance support: No upper extremity supported;Feet supported Sitting balance-Leahy Scale: Good     Standing balance support: Bilateral upper extremity supported Standing balance-Leahy Scale: Fair                               Pertinent Vitals/Pain Pain Assessment: No/denies pain    Home Living Family/patient expects to be discharged to:: Private residence Living Arrangements: Spouse/significant other (wife and son) Available Help at Discharge: Family Type of Home: House Home Access: Stairs to enter Entrance Stairs-Rails:  (banister to hold) Technical brewer of Steps: 3 Home Layout: One level        Prior Function Level of Independence: Independent with assistive device(s)         Comments: Intermittent use of SPC as needed "when I feel off balance"; denies fall history     Hand Dominance        Extremity/Trunk Assessment   Upper Extremity Assessment: Overall WFL for tasks assessed           Lower Extremity Assessment: Overall WFL for tasks assessed (grossly at least 4-/5)         Communication   Communication: HOH  Cognition Arousal/Alertness: Awake/alert Behavior During Therapy: WFL for tasks assessed/performed Overall Cognitive Status: Within Functional Limits for tasks assessed  General Comments      Exercises Other Exercises Other Exercises: Sit/stand x2, bed/chair transfer with RW, cga--min cuing for walker management for optimal safety Other Exercises: Briefly reviewed precautions for cholecystectomy tube management (maintaining awareness with mobility)      Assessment/Plan    PT Assessment Patient needs continued PT services  PT Diagnosis  Difficulty walking;Generalized weakness   PT Problem List Decreased strength;Decreased activity tolerance;Decreased balance;Decreased mobility;Decreased knowledge of use of DME;Decreased safety awareness;Decreased knowledge of precautions;Cardiopulmonary status limiting activity  PT Treatment Interventions DME instruction;Gait training;Stair training;Functional mobility training;Therapeutic activities;Therapeutic exercise;Balance training;Patient/family education   PT Goals (Current goals can be found in the Care Plan section) Acute Rehab PT Goals Patient Stated Goal: "to go home" PT Goal Formulation: With patient/family Time For Goal Achievement: 11/26/15 Potential to Achieve Goals: Good    Frequency Min 2X/week   Barriers to discharge        Co-evaluation               End of Session Equipment Utilized During Treatment: Gait belt Activity Tolerance: Patient tolerated treatment well Patient left: in chair;with call bell/phone within reach;with chair alarm set Nurse Communication: Mobility status         Time: [id number] PT Time Calculation (min) (ACUTE ONLY): 28 min   Charges:   PT Evaluation $Initial PT Evaluation Tier I: 1 Procedure PT Treatments $Therapeutic Activity: 8-22 mins   PT G Codes:        Jaidev Sanger H. Owens Shark, PT, DPT, NCS 11/12/2015, 2:38 PM [phone]

## 2015-11-12 NOTE — Consult Note (Signed)
  Asked to see pt again regarding possible colonoscopy in light of positive stool test for blood. Talked to patient and son at length. There is no signs of active bleeding. Hgb has remained stable for last few days. EGD was negative for bleeding source. Reviewed Dr. Bethanne Ginger notes. I would much prefer to wait before scheduling a colonoscopy as long as hgb remains stable. If pt has to go back on ASA soon, would go ahead with ASA 1st. Doing a colonoscopy now would be at a much higher risk than doing an EGD. If pt is to go home soon, then have pt f/u with GI in 1-2 weeks. If hgb falls, then would arrange an outpatient colonoscopy soon thereafter. Pt and son understood my recommendation. Thanks.

## 2015-11-12 NOTE — Telephone Encounter (Signed)
-----   Message from Nickie Retort, MD sent at 11/12/2015 12:59 PM EST ----- The patient developed retention in the hospital (900cc). Foley placed Will need appt for a trial of void/foley removal in 2 weeks or so. He is still in the hospital now

## 2015-11-12 NOTE — Progress Notes (Signed)
[place] at [facility]                                                                                                                                                                                            Patient Demographics   Paiden Shortridge, is a 81 y.o. male, DOB - 1927/08/24, [id number]  Admit date - 11/05/2015   Admitting Physician Harrie Foreman, MD  Outpatient Primary MD for the patient is Olmedo, Guy Begin, MD   LOS - 7  Subjective: seen today. Still has constipation and abdominal discomfort. At the slight wheezing in the lung so we discontinued the IV fluids and started on nebulizers. By mouth intake is good. Renal function normalized. Family had a lot of questions about restarting anticoagulation and guaiac-positive stools.  Discussed this with Dr. Ubaldo Glassing and GI (Dr.Rein;on call for Dr.OH)yesterday and DR.Ubaldo Glassing will see pt today.  Review of Systems:   CONSTITUTIONAL: No documented fever.  Positive fatigue and weakness. No weight gain, no weight loss.  EYES: No blurry or double vision.  ENT: No tinnitus. No postnasal drip. No redness of the oropharynx.  RESPIRATORY:  faint expiratory wheeze in both lung zones. CARDIOVASCULAR: No chest pain. No orthopnea. No palpitations. No syncope.  GASTROINTESTINAL: No nausea, no vomiting or diarrhea. Right upper quadrant abdominal pain.does have constipation. Complains of constipation.  GENITOURINARY: No dysuria or hematuria. Have urinary retention status post Foley. ENDOCRINE: No polyuria or nocturia. No heat or cold intolerance.  HEMATOLOGY: No anemia. No bruising. No bleeding.  INTEGUMENTARY: No rashes. No lesions.  MUSCULOSKELETAL: No arthritis. No swelling. No gout.  NEUROLOGIC: No numbness, tingling, or ataxia. No seizure-type activity.  PSYCHIATRIC: No anxiety. No insomnia. No ADD.    Vitals:   Filed Vitals:   11/11/15 2234 11/12/15 0500 11/12/15 0559 11/12/15 1135  BP: 150/62   157/69   Pulse: 63  62   Temp:   97.6 F (36.4 C)   TempSrc:   Oral   Resp:   19   Height:      Weight:  115.395 kg (254 lb 6.4 oz)    SpO2: 95%  99% 98%    Wt Readings from Last 3 Encounters:  11/12/15 115.395 kg (254 lb 6.4 oz)  09/30/15 110.678 kg (244 lb)  07/29/15 107.049 kg (236 lb)     Intake/Output Summary (Last 24 hours) at 11/12/15 1206 Last data filed at 11/12/15 0900  Gross per 24 hour  Intake 2712.25 ml  Output   1796 ml  Net 916.25 ml    Physical Exam:   GENERAL: Pleasant-appearing in no apparent distress.  HEAD, EYES, EARS, NOSE AND THROAT: Atraumatic, normocephalic. Extraocular muscles are intact. Pupils equal and reactive to light. Sclerae anicteric. No conjunctival injection. No oro-pharyngeal erythema.  NECK: Supple. There is no jugular venous distention. No bruits, no lymphadenopathy, no thyromegaly.  HEART: Irregularly irregular heart and rhythm,. No murmurs, no rubs, no clicks.  LUNGS: Very faint wheezes in both lung zones. . ABDOMEN: Soft, flat, nontender, nondistended. Has good bowel sounds. No hepatosplenomegaly appreciated.  In Right upper quadrant ,cholecystostomy tube present draining serosanguineous fluid. EXTREMITIES: No evidence of any cyanosis, clubbing, or peripheral edema.  +2 pedal and radial pulses bilaterally.  NEUROLOGIC: The patient is alert, awake, and oriented x3 with no focal motor or sensory deficits appreciated bilaterally.  SKIN: Moist and warm with no rashes appreciated.  Psych: Not anxious, depressed LN: No inguinal LN enlargement    Antibiotics   Anti-infectives    Start     Dose/Rate Route Frequency Ordered Stop   11/11/15 1000  Ampicillin-Sulbactam (UNASYN) 3 g in sodium chloride 0.9 % 100 mL IVPB     3 g 100 mL/hr over 60 Minutes Intravenous Every 6 hours 11/11/15 0950     11/08/15 1923  Ampicillin-Sulbactam (UNASYN) 3 g in sodium chloride 0.9 % 100 mL IVPB  Status:  Discontinued     3 g 100 mL/hr over 60 Minutes  Intravenous Every 12 hours 11/08/15 0954 11/11/15 0950   11/06/15 1300  Ampicillin-Sulbactam (UNASYN) 3 g in sodium chloride 0.9 % 100 mL IVPB  Status:  Discontinued     3 g 100 mL/hr over 60 Minutes Intravenous Every 6 hours 11/06/15 1203 11/08/15 0954      Medications   Scheduled Meds: . ampicillin-sulbactam (UNASYN) IV  3 g Intravenous Q6H  . bisacodyl  10 mg Rectal Once  . FLUoxetine  20 mg Oral Daily  . insulin aspart  0-5 Units Subcutaneous QHS  . insulin aspart  0-9 Units Subcutaneous TID WC  . ipratropium-albuterol  3 mL Nebulization Q4H  . metoprolol tartrate  12.5 mg Oral BID  . pantoprazole (PROTONIX) IV  40 mg Intravenous Q12H  . pravastatin  20 mg Oral q1800  . senna-docusate  2 tablet Oral BID  . sodium chloride  3 mL Intravenous Q12H  . tamsulosin  0.4 mg Oral QPC supper  . tiotropium  1 capsule Inhalation Daily   Continuous Infusions:   PRN Meds:.acetaminophen **OR** acetaminophen, albuterol, hydrALAZINE, morphine injection, nitroGLYCERIN, ondansetron **OR** ondansetron (ZOFRAN) IV, oxyCODONE-acetaminophen, promethazine   Data Review:   Micro Results Recent Results (from the past 240 hour(s))  MRSA PCR Screening     Status: None   Collection Time: 11/06/15  3:15 AM  Result Value Ref Range Status   MRSA by PCR NEGATIVE NEGATIVE Final    Comment:        The GeneXpert MRSA Assay (FDA approved for NASAL specimens only), is one component of a comprehensive MRSA colonization surveillance program. It is not intended to diagnose MRSA infection nor to guide or monitor treatment for MRSA infections.   Culture, blood (routine x 2)     Status: None   Collection Time: 11/06/15 12:35 PM  Result Value Ref Range Status   Specimen Description BLOOD RIGHT ASSIST CONTROL  Final   Special Requests   Final    BOTTLES DRAWN AEROBIC AND ANAEROBIC  [place] ANAERO [place] AERO   Culture NO GROWTH 5 DAYS  Final   Report Status 11/11/2015 FINAL  Final  Culture, blood (routine x  2)  Status: None   Collection Time: 11/06/15 12:38 PM  Result Value Ref Range Status   Specimen Description BLOOD LEFT HAND  Final   Special Requests BOTTLES DRAWN AEROBIC AND ANAEROBIC  3 CC  Final   Culture NO GROWTH 5 DAYS  Final   Report Status 11/11/2015 FINAL  Final  Body fluid culture     Status: None   Collection Time: 11/07/15  3:40 PM  Result Value Ref Range Status   Specimen Description GALL BLADDER  Final   Special Requests NONE  Final   Gram Stain   Final    RARE WBC SEEN MANY GRAM POSITIVE RODS MANY GRAM NEGATIVE RODS    Culture   Final    HEAVY GROWTH ESCHERICHIA COLI NO ANAEROBES ISOLATED    Report Status 11/10/2015 FINAL  Final   Organism ID, Bacteria ESCHERICHIA COLI  Final      Susceptibility   Escherichia coli - MIC*    AMPICILLIN <=2 SENSITIVE Sensitive     CEFTAZIDIME <=1 SENSITIVE Sensitive     CEFAZOLIN <=4 SENSITIVE Sensitive     CEFTRIAXONE <=1 SENSITIVE Sensitive     CIPROFLOXACIN <=0.25 SENSITIVE Sensitive     GENTAMICIN <=1 SENSITIVE Sensitive     IMIPENEM <=0.25 SENSITIVE Sensitive     TRIMETH/SULFA <=20 SENSITIVE Sensitive     PIP/TAZO Value in next row Sensitive      SENSITIVE<=4    * HEAVY GROWTH ESCHERICHIA COLI    Radiology Reports Dg Chest 1 View  11/06/2015  CLINICAL DATA:  Shortness of breath and weakness. EXAM: CHEST 1 VIEW COMPARISON:  11/05/2015. FINDINGS: Trachea is midline. Heart is at the upper limits of normal in size to mildly enlarged. Thoracic aorta is calcified. There is basilar interstitial prominence and indistinctness, new. Small left pleural effusion also new. IMPRESSION: New basilar dependent interstitial prominence/indistinctness and small left pleural effusion, favoring congestive heart failure. Electronically Signed   By: Lorin Picket M.D.   On: 11/06/2015 13:22   Dg Chest 2 View  11/05/2015  CLINICAL DATA:  Acute onset of mid chest pain.  Initial encounter. EXAM: CHEST  2 VIEW COMPARISON:  Chest radiograph  performed 07/15/2015 FINDINGS: The lungs are well-aerated. Vascular congestion is noted. Increased interstitial markings raise concern for mild pulmonary edema, though pneumonia could have a similar appearance. There is no evidence of pleural effusion or pneumothorax. The heart is normal in size. The patient is status post median sternotomy, with evidence of prior CABG. No acute osseous abnormalities are seen. IMPRESSION: Vascular congestion noted. Increased interstitial markings raise concern for mild pulmonary edema, though pneumonia could have a similar appearance. Electronically Signed   By: Garald Balding M.D.   On: 11/05/2015 03:55   Dg Abd 1 View  11/09/2015  CLINICAL DATA:  Abdominal distention status post gallbladder surgery yesterday, no bowel movement for 2 days EXAM: ABDOMEN - 1 VIEW COMPARISON:  None. FINDINGS: There is mild to moderate gaseous distention of the stomach. There is air in small and large bowel with no abnormally dilated loops of bowel. There is no significant fecal retention. Pigtail catheter right upper quadrant noted. IMPRESSION: Nonobstructive bowel gas pattern. Mild to moderate gaseous distention of the stomach likely related to postoperative ileus. Electronically Signed   By: Skipper Cliche M.D.   On: 11/09/2015 19:14   Ct Abdomen Pelvis W Contrast  11/06/2015  CLINICAL DATA:  Chest pain at rest. Nauseated. Intermittent symptoms for 4 weeks. Nausea and diarrhea over Thanksgiving. Decreased hemoglobin. EXAM: CT  ABDOMEN AND PELVIS WITH CONTRAST TECHNIQUE: Multidetector CT imaging of the abdomen and pelvis was performed using the standard protocol following bolus administration of intravenous contrast. CONTRAST:  110mL OMNIPAQUE IOHEXOL 300 MG/ML  SOLN COMPARISON:  None FINDINGS: Lower chest: There is left lower lobe atelectasis. Coronary artery calcifications are present. Heart is mildly enlarged. No pericardial effusion. Upper abdomen: New no focal abnormality identified  within the liver, spleen, pancreas, or right adrenal gland. Left adrenal adenoma measured/ 1.4 x 2.0 cm. There is symmetric enhancement of both kidneys. Small left lower pole cyst is identified. No hydronephrosis. The gallbladder is distended and has a thickened wall. There is significant pericholecystic inflammatory change. Small amount of gas is identified in the nondependent gallbladder fundus, suspicious for gas-forming organisms. Gastrointestinal tract: The stomach and small bowel loops are normal in appearance. The appendix is normal in appearance. Numerous colonic diverticula are present. There is large stool burden, particularly within the rectosigmoid colon. Pelvis: Large prostate with prominent impression upon the bladder. Urinary bladder is normal in appearance. No free pelvic fluid. Retroperitoneum: Atherosclerotic calcification of the abdominal aorta and branches. No aneurysm. Abdominal wall: Small fat containing supraumbilical hernia Osseous structures: Significant lumbar spondylosis. No suspicious lytic or blastic lesions are identified. IMPRESSION: 1. Distended, thick-walled gallbladder with significant pericholecystic stranding. Gas within the gallbladder wall is suspicious for infection. Further evaluation with ultrasound is recommended. 2. Coronary artery disease. 3. Small left renal cyst. 4. Colonic diverticulosis.  Moderate stool burden. 5. Enlarged prostate. 6. Small fat containing supraumbilical hernia. Electronically Signed   By: Nolon Nations M.D.   On: 11/06/2015 14:05   Ct Image Guided Fluid Drain By Catheter  11/07/2015  CLINICAL DATA:  81 year old with acute cholecystitis. EXAM: CT-GUIDED CHOLECYSTOSTOMY TUBE PLACEMENT Physician: Stephan Minister. Anselm Pancoast, MD FLUOROSCOPY TIME:  None MEDICATIONS: 1 mg Versed, 25 mcg fentanyl. A radiology nurse monitored the patient for moderate sedation. ANESTHESIA/SEDATION: Moderate sedation time: 30 minutes PROCEDURE: The procedure was explained to the  patient. The risks and benefits of the procedure were discussed and the patient's questions were addressed. Informed consent was obtained from the patient. The patient was placed supine on the CT scanner. Images through the abdomen were obtained. The right upper abdomen was prepped and draped in sterile fashion. Maximal barrier sterile technique was utilized including mask, sterile gowns, sterile gloves, sterile drape, hand hygiene and skin antiseptic. The skin was anesthetized with 1% lidocaine. 18 gauge needle was directed into the gallbladder fundus with CT guidance. Stiff Amplatz wire was advanced into the gallbladder with CT guidance. The tract was dilated to accommodate a 10.2 [place] multipurpose drain. The drain was advanced into the gallbladder and 100 mL of cloudy brown fluid was aspirated. Catheter was sutured to skin and attached to gravity bag. FINDINGS: The gallbladder is distended with surrounding edema/ fluid and there is gas in the gallbladder fundus. Findings are consistent with acute cholecystitis. 100 mL of cloudy brown fluid was aspirated. Estimated blood loss: Minimal COMPLICATIONS: None IMPRESSION: Successful CT-guided cholecystostomy tube placement. Electronically Signed   By: Markus Daft M.D.   On: 11/07/2015 17:01   US Abdomen Limited Ruq  11/07/2015  CLINICAL DATA:  Right upper quadrant pain. EXAM: US ABDOMEN LIMITED - RIGHT UPPER QUADRANT COMPARISON:  CT 11/06/2015. FINDINGS: Gallbladder: Gallbladder is distended. Sludge is noted throughout the gallbladder. Gallbladder wall is thickened to 7 mm. Small amount of pericholecystic fluid noted. Positive Murphy sign. Findings are consistent with cholecystitis. Common bile duct: Diameter: 6 mm. Liver: No focal  hepatic abnormality identified. Hepatic echotexture normal. Left hepatic lobe not well visualized due to overlying bowel gas. IMPRESSION: Distended gallbladder with sludge. Gallbladder wall thickening. Small amount of pericholecystic  fluid. Positive Murphy sign. Findings consistent with gallbladder sludge with associated cholecystitis. No biliary ductal distention . Electronically Signed   By: Marcello Moores  Register   On: 11/07/2015 10:04     CBC  Recent Labs Lab 11/06/15 0409 11/07/15 0701 11/08/15 0515 11/09/15 0501 11/11/15 0508  WBC 21.3* 26.3* 15.9* 12.4* 10.2  HGB 8.9* 8.7* 8.1* 7.5* 7.9*  HCT 28.3* 27.2* 25.3* 23.3* 24.9*  PLT 340 335 313 274 313  MCV 80.4 81.7 81.7 81.2 81.0  MCH 25.3* 26.0 26.0 26.1 25.7*  MCHC 31.4* 31.8* 31.9* 32.1 31.8*  RDW 15.9* 16.4* 16.2* 16.6* 16.4*    Chemistries   Recent Labs Lab 11/06/15 1235  11/08/15 0515 11/08/15 1257 11/10/15 0536 11/11/15 0509 11/12/15 0929  NA  --   < > 138 136 135 137 140  K  --   < > 4.3 4.1 4.0 4.1 4.5  CL  --   < > 101 101 104 108 107  CO2  --   < > 26 25 24 24 26   GLUCOSE  --   < > 187* 177* 140* 125* 139*  BUN  --   < > 53* 59* 69* 56* 33*  CREATININE  --   < > 2.99* 3.31* 3.13* 2.05* 1.25*  CALCIUM  --   < > 8.6* 8.6* 8.7* 9.0 9.4  AST 11*  --  54* 44*  --   --   --   ALT 10*  --  17 16*  --   --   --   ALKPHOS 51  --  53 52  --   --   --   BILITOT 1.2  --  0.5 0.6  --   --   --   < > = values in this interval not displayed. ------------------------------------------------------------------------------------------------------------------ estimated creatinine clearance is 58.1 mL/min (by C-G formula based on Cr of 1.25). ------------------------------------------------------------------------------------------------------------------ No results for input(s): HGBA1C in the last 72 hours. ------------------------------------------------------------------------------------------------------------------ No results for input(s): CHOL, HDL, LDLCALC, TRIG, CHOLHDL, LDLDIRECT in the last 72 hours. ------------------------------------------------------------------------------------------------------------------ No results for input(s): TSH,  T4TOTAL, T3FREE, THYROIDAB in the last 72 hours.  Invalid input(s): FREET3 ------------------------------------------------------------------------------------------------------------------ No results for input(s): VITAMINB12, FOLATE, FERRITIN, TIBC, IRON, RETICCTPCT in the last 72 hours.  Coagulation profile  Recent Labs Lab 11/07/15 1159  INR 1.39    No results for input(s): DDIMER in the last 72 hours.  Cardiac Enzymes  Recent Labs Lab 11/06/15 0409 11/06/15 1010 11/06/15 1547  TROPONINI 0.08* 0.05* 0.06*   ------------------------------------------------------------------------------------------------------------------ Invalid input(s): POCBNP    Assessment & Plan    This is an 81 year old Caucasian male with past medical history significant for coronary artery disease status post CABG and PCI now has a GI bleed and symptomatic anemia.  #1 severe sepsis secondary to Escherichia coli: Improved after a cholecystostomy tube placement. WBC normalized. Continue Unasyn for broader coverage. Surgeries following for timing of cholecystectomy as an outpatient.  2. Atrial fibrillation; rate controlled well. Continue present dose of metoprolol. CAD with recent stent placement in [place] is on hold due to GI bleed.d/w DR.Fath and he spoke to family for now recommends holding ASA,Brillinta until we find the source of GI bleed/     3.obscure  GI bleed hold Brilinta: EGD did not show any ulcers. guiac positive stools.  will discuss with  Dr. Candace Cruise today. Had melena on admission may be the reason for guaiac-positive stool. Hemoglobin is stable. Appreciate Dr. Candace Cruise to see the patient again and discuss with the family as well as they are really concerned. Possibly need colonoscopy to evaluate the source of bleeding till then he needs to be off [place].   4. Nausea vomiting: resolved. Due to acute cholecystitis continue Unasyn , continue Zofran.   5. Chest pain: Likely anginal  secondary to anemia. Troponin is negative.  Cards consult appreciated they feel that this is not an MI.  6. Acute on chronic systolic chf-we will resume the Lasix tomorrow. Hold IV fluids.   7. Essential hypertension: Restarted metoprolol.   6. Diabetes mellitus type 2: Continue sliding scale insulin, t glucose is well controlled. Diabeta metformin contraindicated in renal failure. So we are not giving that.   7. COPD: Due to wheezing today we started nebulizers. X-ray today.  7. Hyper Lipidemia: Continue statin therapy  8. DVT prophylaxis: SCDs  9. GI prophylaxis: IV Protonix due to GI bleed  #10 acute blood loss anemia on chronic anemia status post transfusion, hemoglobin stable.  anemia is due to GI bleed.  #11, acute renal failure due to postobstructive uropathy with BPH: s/p foley.. Discussed with the urologist yesterday recommended  discharging him with Foley, follow-up with outpatient urology Continue flomax. Renal function improved. Discontinue IV fluids..    Discussed with the  Son and wife.  #12 .constipation continue Dulcolax, Colace moret than 40% of the time spent in coordination of the care.     Code Status Orders        Start     Ordered   11/05/15 [id number]  Full code   Continuous     11/05/15 0638           Consults gi, cards  DVT Prophylaxis   SCDs    Lab Results  Component Value Date   PLT 313 11/11/2015     Time Spent in minutes 35 minutes of critical care time spent  [facility] M.D on 11/12/2015 at 12:06 PM  Between 7am to 6pm - Pager - [phone]  After 6pm go to [email or web address] - password [email or web address] [facility]   Office  [phone]

## 2015-11-12 NOTE — Progress Notes (Signed)
Spoke with patient and son who was present in the room. Patient lives with spouse who is 3 and his youngest son. He is alert and orient but hearing impaired. [facility]. Stated no difficulty with co pay on home medications. Patient has physical therapy evaluation scheduled for today. Anticipate discharge with H H services. RN PT and Aid. Will provide patient with list of Home Health private pay services as well. Patient and son would like to have [facility] [place] provider.

## 2015-11-12 NOTE — Progress Notes (Signed)
Justin Leonard  SUBJECTIVE: No chest pain. Still somewhat weak. Pt concerned about going back on brilinta    Filed Vitals:   11/11/15 2234 11/12/15 0500 11/12/15 0559 11/12/15 1135  BP: 150/62  157/69   Pulse: 63  62   Temp:   97.6 F (36.4 C)   TempSrc:   Oral   Resp:   19   Height:      Weight:  115.395 kg (254 lb 6.4 oz)    SpO2: 95%  99% 98%    Intake/Output Summary (Last 24 hours) at 11/12/15 1247 Last data filed at 11/12/15 1100  Gross per 24 hour  Intake 2268.25 ml  Output   2220 ml  Net  48.25 ml    LABS: Basic Metabolic Panel:  Recent Labs  11/11/15 0509 11/12/15 0929  NA 137 140  K 4.1 4.5  CL 108 107  CO2 24 26  GLUCOSE 125* 139*  BUN 56* 33*  CREATININE 2.05* 1.25*  CALCIUM 9.0 9.4   Liver Function Tests: No results for input(s): AST, ALT, ALKPHOS, BILITOT, PROT, ALBUMIN in the last 72 hours. No results for input(s): LIPASE, AMYLASE in the last 72 hours. CBC:  Recent Labs  11/11/15 0508 11/12/15 0929  WBC 10.2 9.6  HGB 7.9* 8.0*  HCT 24.9* 25.8*  MCV 81.0 81.8  PLT 313 333   Cardiac Enzymes: No results for input(s): CKTOTAL, CKMB, CKMBINDEX, TROPONINI in the last 72 hours. BNP: Invalid input(s): POCBNP D-Dimer: No results for input(s): DDIMER in the last 72 hours. Hemoglobin A1C: No results for input(s): HGBA1C in the last 72 hours. Fasting Lipid Panel: No results for input(s): CHOL, HDL, LDLCALC, TRIG, CHOLHDL, LDLDIRECT in the last 72 hours. Thyroid Function Tests: No results for input(s): TSH, T4TOTAL, T3FREE, THYROIDAB in the last 72 hours.  Invalid input(s): FREET3 Anemia Panel: No results for input(s): VITAMINB12, FOLATE, FERRITIN, TIBC, IRON, RETICCTPCT in the last 72 hours.   Physical Exam: Blood pressure 157/69, pulse 62, temperature 97.6 F (36.4 C), temperature source Oral, resp. rate 19, height 6\' 3"  (1.905 m), weight 115.395 kg (254 lb 6.4 oz), SpO2 98 %.    General appearance:  alert and cooperative Resp: clear to auscultation bilaterally Chest wall: no tenderness Cardio: regular rate and rhythm GI: normal findings: mild tenderness Extremities: extremities normal, atraumatic, no cyanosis or edema Neurologic: Grossly normal  TELEMETRY: Reviewed telemetry pt in sinus tachy:  ASSESSMENT AND PLAN:  Active Problems:   GI bleed-site unclear. No further bleeding. Off of brilinta and asa. Had des in 8/16 in rca distribution. Has been off for 7 days. Given risk and benefit of bleeding vs stent restenosis, would remain off of dual antiplatelt therapy for now. Consider restrating after colonscopy and/or source of gi bleed is determined and resolved. When restarting would use clopidigrel rather than ticagrelor. Timing will depend on further gi work up, pt course and evidence of further bleeding. Discussed with patient and multiple family members.    Acute cholecystitis-being followed by gen surgery   Bleeding gastrointestinal    Teodoro Spray., MD, [facility] 11/12/2015 12:47 PM

## 2015-11-12 NOTE — Progress Notes (Signed)
[facility]  ROUNDING NOTE   Subjective:  Renal function improving.   Creatinine currently down to 1.25.   Patient's son is at bedside.   Objective:  Vital signs in last 24 hours:  Temp:  [97.2 F (36.2 C)-97.9 F (36.6 C)] 97.2 F (36.2 C) (12/14 1351) Pulse Rate:  [62-67] 67 (12/14 1351) Resp:  [17-19] 18 (12/14 1351) BP: (150-159)/(61-69) 154/66 mmHg (12/14 1351) SpO2:  [93 %-99 %] 98 % (12/14 1351) Weight:  [115.395 kg (254 lb 6.4 oz)] 115.395 kg (254 lb 6.4 oz) (12/14 0500)  Weight change: 1.633 kg (3 lb 9.6 oz) Filed Weights   11/09/15 0443 11/11/15 0606 11/12/15 0500  Weight: 111.6 kg (246 lb 0.5 oz) 113.762 kg (250 lb 12.8 oz) 115.395 kg (254 lb 6.4 oz)    Intake/Output: I/O last 3 completed shifts: In: 5172.3 [P.O.:960; I.V.:3212.3; Other:800; IV Piggyback:200] Out: [id number] [Urine:3100; Drains:160; Stool:2]   Intake/Output this shift:  Total I/O In: 360 [P.O.:360] Out: 425 [Urine:425]  Physical Exam: General: NAD, resting in bed  Head: Normocephalic, atraumatic. Moist oral mucosal membranes  Eyes: Anicteric  Neck: Supple, trachea midline  Lungs:  Clear to auscultation normal effort  Heart: S1S2 no rubs  Abdomen:  Soft, nontender, cholecystostomy tube in place  Extremities: no peripheral edema.  Neurologic: Nonfocal, moving all four extremities  Skin: No lesions       Basic Metabolic Panel:  Recent Labs Lab 11/08/15 0515 11/08/15 1257 11/10/15 0536 11/11/15 0509 11/12/15 0929  NA 138 136 135 137 140  K 4.3 4.1 4.0 4.1 4.5  CL 101 101 104 108 107  CO2 26 25 24 24 26   GLUCOSE 187* 177* 140* 125* 139*  BUN 53* 59* 69* 56* 33*  CREATININE 2.99* 3.31* 3.13* 2.05* 1.25*  CALCIUM 8.6* 8.6* 8.7* 9.0 9.4    Liver Function Tests:  Recent Labs Lab 11/06/15 1235 11/08/15 0515 11/08/15 1257  AST 11* 54* 44*  ALT 10* 17 16*  ALKPHOS 51 53 52  BILITOT 1.2 0.5 0.6  PROT 7.1 6.1* 6.4*  ALBUMIN 3.4* 2.8* 2.7*    Recent Labs Lab  11/06/15 1235  LIPASE 17   No results for input(s): AMMONIA in the last 168 hours.  CBC:  Recent Labs Lab 11/07/15 0701 11/08/15 0515 11/09/15 0501 11/11/15 0508 11/12/15 0929  WBC 26.3* 15.9* 12.4* 10.2 9.6  HGB 8.7* 8.1* 7.5* 7.9* 8.0*  HCT 27.2* 25.3* 23.3* 24.9* 25.8*  MCV 81.7 81.7 81.2 81.0 81.8  PLT 335 313 274 313 333    Cardiac Enzymes:  Recent Labs Lab 11/05/15 1929 11/06/15 0409 11/06/15 1010 11/06/15 1547  TROPONINI 0.06* 0.08* 0.05* 0.06*    BNP: Invalid input(s): POCBNP  CBG:  Recent Labs Lab 11/11/15 1130 11/11/15 1633 11/11/15 2141 11/12/15 0728 11/12/15 1108  GLUCAP 139* 144* 132* 114* 127*    Microbiology: Results for orders placed or performed during the hospital encounter of 11/05/15  MRSA PCR Screening     Status: None   Collection Time: 11/06/15  3:15 AM  Result Value Ref Range Status   MRSA by PCR NEGATIVE NEGATIVE Final    Comment:        The GeneXpert MRSA Assay (FDA approved for NASAL specimens only), is one component of a comprehensive MRSA colonization surveillance program. It is not intended to diagnose MRSA infection nor to guide or monitor treatment for MRSA infections.   Culture, blood (routine x 2)     Status: None   Collection Time: 11/06/15  12:35 PM  Result Value Ref Range Status   Specimen Description BLOOD RIGHT ASSIST CONTROL  Final   Special Requests   Final    BOTTLES DRAWN AEROBIC AND ANAEROBIC  [place] ANAERO [place] AERO   Culture NO GROWTH 5 DAYS  Final   Report Status 11/11/2015 FINAL  Final  Culture, blood (routine x 2)     Status: None   Collection Time: 11/06/15 12:38 PM  Result Value Ref Range Status   Specimen Description BLOOD LEFT HAND  Final   Special Requests BOTTLES DRAWN AEROBIC AND ANAEROBIC  3 CC  Final   Culture NO GROWTH 5 DAYS  Final   Report Status 11/11/2015 FINAL  Final  Body fluid culture     Status: None   Collection Time: 11/07/15  3:40 PM  Result Value Ref Range Status    Specimen Description GALL BLADDER  Final   Special Requests NONE  Final   Gram Stain   Final    RARE WBC SEEN MANY GRAM POSITIVE RODS MANY GRAM NEGATIVE RODS    Culture   Final    HEAVY GROWTH ESCHERICHIA COLI NO ANAEROBES ISOLATED    Report Status 11/10/2015 FINAL  Final   Organism ID, Bacteria ESCHERICHIA COLI  Final      Susceptibility   Escherichia coli - MIC*    AMPICILLIN <=2 SENSITIVE Sensitive     CEFTAZIDIME <=1 SENSITIVE Sensitive     CEFAZOLIN <=4 SENSITIVE Sensitive     CEFTRIAXONE <=1 SENSITIVE Sensitive     CIPROFLOXACIN <=0.25 SENSITIVE Sensitive     GENTAMICIN <=1 SENSITIVE Sensitive     IMIPENEM <=0.25 SENSITIVE Sensitive     TRIMETH/SULFA <=20 SENSITIVE Sensitive     PIP/TAZO Value in next row Sensitive      SENSITIVE<=4    * HEAVY GROWTH ESCHERICHIA COLI    Coagulation Studies: No results for input(s): LABPROT, INR in the last 72 hours.  Urinalysis: No results for input(s): COLORURINE, LABSPEC, PHURINE, GLUCOSEU, HGBUR, BILIRUBINUR, KETONESUR, PROTEINUR, UROBILINOGEN, NITRITE, LEUKOCYTESUR in the last 72 hours.  Invalid input(s): APPERANCEUR    Imaging: No results found.   Medications:     . ampicillin-sulbactam (UNASYN) IV  3 g Intravenous Q6H  . bisacodyl  10 mg Rectal Once  . FLUoxetine  20 mg Oral Daily  . insulin aspart  0-5 Units Subcutaneous QHS  . insulin aspart  0-9 Units Subcutaneous TID WC  . ipratropium-albuterol  3 mL Nebulization Q4H  . metoprolol tartrate  12.5 mg Oral BID  . pantoprazole (PROTONIX) IV  40 mg Intravenous Q12H  . pravastatin  20 mg Oral q1800  . senna-docusate  2 tablet Oral BID  . sodium chloride  3 mL Intravenous Q12H  . tamsulosin  0.4 mg Oral QPC supper  . tiotropium  1 capsule Inhalation Daily   acetaminophen **OR** acetaminophen, albuterol, hydrALAZINE, morphine injection, nitroGLYCERIN, ondansetron **OR** ondansetron (ZOFRAN) IV, oxyCODONE-acetaminophen, promethazine  Assessment/ Plan:  81 y.o. male  male with congestive heart failure, hypertension, diabetes, coronary disease, CABG hyperlipidemia, peripheral neuropathy, COPD, who was admitted to [facility] on 11/05/2015 for evaluation of chest pain. Hospital course complicated by diagnosis of acute cholecystitis, status post placement of CT-guided cholecystostomy tube on December 9, acute renal failure  1. Acute renal failure. Baseline creatinine is normal at 1.05.  Exposed to contrast and also had acute cholecystitis. 2. BPH 3. Sepsis from acute cholecystitis 4. Atrial fibrillation with rapid ventricular response 5. Melena 6. Anemia unspecified.  7.  Urinary retention.  Plan:   Renal function continues to improve.  Creatinine currently down to 1.25.  It appears that he is recovering from contrast-induced nephropathy.  We encouraged the patient to maintain adequate fluid hydration status.  He verbalized understanding of this.  We recommend avoidance of any further nephrotoxins at this time.  No further renal input at this time therefore we will sign off.   LOS: 7 Justin Leonard 12/14/20163:17 PM

## 2015-11-12 NOTE — Progress Notes (Signed)
CC: Acute cholecystitis Subjective: Patient has improved considerably with his acute cholecystitis following cholecystostomy tube placement by IR. One son is present who had not met before. Patient denies abdominal pain and no nausea or vomiting. He did have guaiac positive stools apparently according to the son. His EGD showed no sign of bleeding. Denies fevers or chills.  Objective: Vital signs in last 24 hours: Temp:  [97.6 F (36.4 C)-98.2 F (36.8 C)] 97.6 F (36.4 C) (12/14 0559) Pulse Rate:  [61-67] 62 (12/14 0559) Resp:  [17-19] 19 (12/14 0559) BP: (150-164)/(61-69) 157/69 mmHg (12/14 0559) SpO2:  [93 %-99 %] 99 % (12/14 0559) Weight:  [254 lb 6.4 oz (115.395 kg)] 254 lb 6.4 oz (115.395 kg) (12/14 0500) Last BM Date:  (pt unsure)  Intake/Output from previous day: 12/13 0701 - 12/14 0700 In: 3093.9 [P.O.:960; I.V.:1933.9; IV Piggyback:200] Out: 2377 [Urine:2300; Drains:75; Stool:2] Intake/Output this shift:    Physical exam:  No scleral icterus Awake alert oriented Abdomen is soft lightly distended but nontender. Quadrant cholecystostomy tube drain is present. It is draining serous fluid now Calves are nontender No jaundice  Lab Results: CBC   Recent Labs  11/11/15 0508  WBC 10.2  HGB 7.9*  HCT 24.9*  PLT 313   BMET  Recent Labs  11/10/15 0536 11/11/15 0509  NA 135 137  K 4.0 4.1  CL 104 108  CO2 24 24  GLUCOSE 140* 125*  BUN 69* 56*  CREATININE 3.13* 2.05*  CALCIUM 8.7* 9.0   PT/INR No results for input(s): LABPROT, INR in the last 72 hours. ABG No results for input(s): PHART, HCO3 in the last 72 hours.  Invalid input(s): PCO2, PO2  Studies/Results: No results found.  Anti-infectives: Anti-infectives    Start     Dose/Rate Route Frequency Ordered Stop   11/11/15 1000  Ampicillin-Sulbactam (UNASYN) 3 g in sodium chloride 0.9 % 100 mL IVPB     3 g 100 mL/hr over 60 Minutes Intravenous Every 6 hours 11/11/15 0950     11/08/15 1923   Ampicillin-Sulbactam (UNASYN) 3 g in sodium chloride 0.9 % 100 mL IVPB  Status:  Discontinued     3 g 100 mL/hr over 60 Minutes Intravenous Every 12 hours 11/08/15 0954 11/11/15 0950   11/06/15 1300  Ampicillin-Sulbactam (UNASYN) 3 g in sodium chloride 0.9 % 100 mL IVPB  Status:  Discontinued     3 g 100 mL/hr over 60 Minutes Intravenous Every 6 hours 11/06/15 1203 11/08/15 0954      Assessment/Plan:  Labs are personally reviewed. His improved considerably from his acute cholecystitis with sepsis. Cholecystostomy tube is present and functional. Timing of cholecystectomy is the big question at this point concerning the patient's other medical problems. He has been off of his antiplatelet drug. Multiple questions most of which needed to be answered by internal medicine including when to restart antiplatelet drugs. I would be most in favor of his cholecystectomy at this point due to his recent stent placement and antiplatelet drug activity. Also his recent sepsis makes him at high risk as well.  Florene Glen, MD, FACS  11/12/2015

## 2015-11-12 NOTE — Consult Note (Signed)
@ENCDATE @ 12:50 PM   Justin Leonard 1927-02-28 [id number]  Referring provider: Dr. Governor Specking  Chief Complaint  Patient presents with  . Chest Pain    HPI:  The patient is an 82 year old gentleman who denies previous genitourinary history who was noted with sepsis and acute cholecystitis. In the hospital the patient developed urinary retention of 900 cc. Foley catheter was placed. He denies any problems with urination at home other than a weak stream. He states he has never seen a urologist before and has not been on medication for a large prostate. A CT scan does show a very large prostate protruding into his bladder. He also had some AKI which is resolving and attributed to his urinary retention.   PMH: Past Medical History  Diagnosis Date  . CHF (congestive heart failure) ([place])   . Hypertension   . Diabetes mellitus without complication ([place])   . Coronary artery disease   . Hyperlipidemia   . Deafness   . Peripheral neuropathy ([place])   . Back pain   . Umbilical hernia   . Depression   . Melena   . COPD (chronic obstructive pulmonary disease) ([place])   . Skin cancer     Surgical History: Past Surgical History  Procedure Laterality Date  . Coronary artery bypass graft    . Cardiac catheterization Left 07/17/2015    Procedure: Right/Left Heart Cath and Coronary Angiography;  Surgeon: [name], MD;  Location: [place] [facility];  Service: Cardiovascular;  Laterality: Left;  . Cardiac catheterization N/A 07/17/2015    Procedure: Coronary Stent Intervention;  Surgeon: [name], MD;  Location: [place] [facility];  Service: Cardiovascular;  Laterality: N/A;  . Coronary angioplasty    . Esophagogastroduodenoscopy N/A 11/10/2015    Procedure: ESOPHAGOGASTRODUODENOSCOPY (EGD);  Surgeon: [name], MD;  [facility];  Service: Endoscopy;  Laterality: N/A;    Home Medications:    Medication List    ASK your doctor about these medications      acetaminophen 325 MG tablet  Commonly known as:  TYLENOL  Take 650 mg by mouth every 6 (six) hours as needed.     aspirin EC 81 MG tablet  Take 81 mg by mouth daily.     FLUoxetine 20 MG capsule  Commonly known as:  PROZAC  Take 20 mg by mouth daily.     furosemide 20 MG tablet  Commonly known as:  LASIX  Take 20-40 mg by mouth daily as needed for fluid.     furosemide 40 MG tablet  Commonly known as:  LASIX  Take 1 tablet (40 mg total) by mouth 2 (two) times daily.     glyBURIDE 5 MG tablet  Commonly known as:  DIABETA  Take 5 mg by mouth 2 (two) times daily with a meal.     lisinopril 40 MG tablet  Commonly known as:  PRINIVIL,ZESTRIL  Take 40 mg by mouth daily.     lovastatin 20 MG tablet  Commonly known as:  MEVACOR  Take 20 mg by mouth at bedtime.     metFORMIN 500 MG tablet  Commonly known as:  GLUCOPHAGE  Take 500 mg by mouth 2 (two) times daily with a meal. 2 tablets in AM and 1 tablet in PM     metoprolol 50 MG tablet  Commonly known as:  LOPRESSOR  Take 50 mg by mouth 2 (two) times daily.     nitroGLYCERIN 0.4 MG SL tablet  Commonly known as:  NITROSTAT  Place 1 tablet under the tongue every 5 (five) minutes as needed.     pantoprazole 40 MG tablet  Commonly known as:  PROTONIX  Take 40 mg by mouth daily.     SPIRIVA RESPIMAT 2.5 MCG/ACT Aers  Generic drug:  Tiotropium Bromide Monohydrate  Inhale 2 puffs into the lungs daily.     ticagrelor 90 MG Tabs tablet  Commonly known as:  BRILINTA  Take 1 tablet (90 mg total) by mouth 2 (two) times daily.     VENTOLIN HFA 108 (90 BASE) MCG/ACT inhaler  Generic drug:  albuterol  Inhale 1-2 puffs into the lungs as needed.        Allergies: No Known Allergies  Family History: Family History  Problem Relation Age of Onset  . Anemia Neg Hx   . Arrhythmia Neg Hx   . Asthma Neg Hx   . Clotting disorder Neg Hx   . Fainting Neg Hx   . Heart attack Neg Hx   . Heart disease Neg Hx   . Heart failure  Neg Hx   . Hyperlipidemia Neg Hx   . Hypertension Father   . CVA Father     Social History:  reports that he quit smoking about 37 years ago. His smoking use included Cigarettes. He has a 90 pack-year smoking history. He has never used smokeless tobacco. He reports that he does not drink alcohol or use illicit drugs.  ROS: 12 point ROS negative except as per HPI/H&P                                        Physical Exam: BP 157/69 mmHg  Pulse 62  Temp(Src) 97.6 F (36.4 C) (Oral)  Resp 19  Ht 6\' 3"  (1.905 m)  Wt 254 lb 6.4 oz (115.395 kg)  BMI 31.80 kg/m2  SpO2 98%  Constitutional:  Alert and oriented, No acute distress. HEENT: [place] AT, moist mucus membranes.  Trachea midline, no masses. Cardiovascular: No clubbing, cyanosis, or edema. Respiratory: Normal respiratory effort, no increased work of breathing. GI: Abdomen is soft, nontender, nondistended, no abdominal masses GU: No CVA tenderness. . Normal phallus. Testicles and equal bilaterally. Foley in place. Clear yellow urine.. Deferred as patient isn't chair eating at this time.  Skin: No rashes, bruises or suspicious lesions. Lymph: No cervical or inguinal adenopathy. Neurologic: Grossly intact, no focal deficits, moving all 4 extremities. Psychiatric: Normal mood and affect.  Laboratory Data: Lab Results  Component Value Date   WBC 9.6 11/12/2015   HGB 8.0* 11/12/2015   HCT 25.8* 11/12/2015   MCV 81.8 11/12/2015   PLT 333 11/12/2015    Lab Results  Component Value Date   CREATININE 1.25* 11/12/2015    No results found for: PSA  No results found for: TESTOSTERONE  Lab Results  Component Value Date   HGBA1C 5.4 11/05/2015    Urinalysis    Component Value Date/Time   COLORURINE AMBER* 11/06/2015 1200   APPEARANCEUR CLEAR* 11/06/2015 1200   LABSPEC 1.019 11/06/2015 1200   PHURINE 5.0 11/06/2015 1200   GLUCOSEU NEGATIVE 11/06/2015 1200   HGBUR 1+* 11/06/2015 1200   BILIRUBINUR  NEGATIVE 11/06/2015 1200   KETONESUR NEGATIVE 11/06/2015 1200   PROTEINUR >500* 11/06/2015 1200   NITRITE NEGATIVE 11/06/2015 1200   LEUKOCYTESUR NEGATIVE 11/06/2015 1200     Assessment & Plan:  1. Large volume urinary retention -Continue Foley -Continue Flomax -The patient should be discharged home with Foley catheter and follow-up in a few weeks with urology for trial of void  2. BPH -As above  3. AKI -renal function improving  Nickie Retort, MD  [facility] [address] [place], [place] [address] [phone]

## 2015-11-12 NOTE — Progress Notes (Signed)
Dr. Vianne Bulls notified of expiratory wheezes. Breathing treatment and discontinue fluids ordered. Will continue to monitor.   Almedia Balls, RN

## 2015-11-12 NOTE — Care Management Important Message (Signed)
Important Message  Patient Details  Name: Justin Leonard MRN: [id number] Date of Birth: 1927/05/09   Medicare Important Message Given:  Yes    Juliann Pulse A Kaleyah Labreck 11/12/2015, 11:09 AM

## 2015-11-13 LAB — GLUCOSE, CAPILLARY
Glucose-Capillary: 135 mg/dL — ABNORMAL HIGH (ref 65–99)
Glucose-Capillary: 173 mg/dL — ABNORMAL HIGH (ref 65–99)
Glucose-Capillary: 169 mg/dL — ABNORMAL HIGH (ref 65–99)
Glucose-Capillary: 164 mg/dL — ABNORMAL HIGH (ref 65–99)

## 2015-11-13 MED ORDER — IPRATROPIUM-ALBUTEROL 0.5-2.5 (3) MG/3ML IN SOLN: 3.0000 mL | RESPIRATORY_TRACT | Status: DC | PRN

## 2015-11-13 NOTE — Progress Notes (Signed)
CC: Acute cholecystitis resolving Subjective: A she is up and sitting at the bedside tolerating a regular diet with no abdominal pain. He is seen with his son present. He describes no abdominal pain and no nausea or vomiting no fevers or chills.  Objective: Vital signs in last 24 hours: Temp:  [97.2 F (36.2 C)-98.4 F (36.9 C)] 97.6 F (36.4 C) (12/15 0534) Pulse Rate:  [67-68] 67 (12/15 0534) Resp:  [16-19] 19 (12/15 0534) BP: (154-167)/(59-66) 156/61 mmHg (12/15 0534) SpO2:  [95 %-98 %] 98 % (12/15 0534) Weight:  [258 lb (117.028 kg)] 258 lb (117.028 kg) (12/15 0500) Last BM Date: 11/12/15  Intake/Output from previous day: 12/14 0701 - 12/15 0700 In: 600 [P.O.:600] Out: 2070 [Urine:1950; Drains:120] Intake/Output this shift: Total I/O In: -  Out: 400 [Urine:400]  Physical exam:  Patient with cholecystostomy tube in place he appears quite comfortable vital signs are reviewed he is soft and nontender at his abdomen no icterus no jaundice and nontender calves.  Lab Results: CBC   Recent Labs  11/11/15 0508 11/12/15 0929  WBC 10.2 9.6  HGB 7.9* 8.0*  HCT 24.9* 25.8*  PLT 313 333   BMET  Recent Labs  11/11/15 0509 11/12/15 0929  NA 137 140  K 4.1 4.5  CL 108 107  CO2 24 26  GLUCOSE 125* 139*  BUN 56* 33*  CREATININE 2.05* 1.25*  CALCIUM 9.0 9.4   PT/INR No results for input(s): LABPROT, INR in the last 72 hours. ABG No results for input(s): PHART, HCO3 in the last 72 hours.  Invalid input(s): PCO2, PO2  Studies/Results: No results found.  Anti-infectives: Anti-infectives    Start     Dose/Rate Route Frequency Ordered Stop   11/11/15 1000  Ampicillin-Sulbactam (UNASYN) 3 g in sodium chloride 0.9 % 100 mL IVPB     3 g 100 mL/hr over 60 Minutes Intravenous Every 6 hours 11/11/15 0950     11/08/15 1923  Ampicillin-Sulbactam (UNASYN) 3 g in sodium chloride 0.9 % 100 mL IVPB  Status:  Discontinued     3 g 100 mL/hr over 60 Minutes Intravenous Every  12 hours 11/08/15 0954 11/11/15 0950   11/06/15 1300  Ampicillin-Sulbactam (UNASYN) 3 g in sodium chloride 0.9 % 100 mL IVPB  Status:  Discontinued     3 g 100 mL/hr over 60 Minutes Intravenous Every 6 hours 11/06/15 1203 11/08/15 0954      Assessment/Plan:  Acute cholecystitis resolving with cholecystostomy tube on antibiotics. At this point I would forego surgical intervention for now and see the patient in the office in 2 weeks and determine whether or not to proceed with surgery or just remove the cholecystostomy tube. He could go home at any time on oral antibiotics to follow-up in my office in 2 weeks  Florene Glen, MD, [facility]  11/13/2015

## 2015-11-13 NOTE — Care Management (Signed)
Discussed case with Dr Vianne Bulls. Attending would like to keep patient one more night due to dc of Brillenta and starting on aspirin. Patient will discharge tomorrow after labs reviewed with foley and T Tube. Home health arranged with [facility].

## 2015-11-13 NOTE — Care Management (Signed)
Spoke with paitent spouse who had concerns over patient going home. Stated that although her son lives with them that he will not be there all the time. She was also concerned about the cost of Home Health which I explained would be covered by insurance. I Explained that physical therapy had evaluated the patient and their recommendation was Home Health. Discussed patient condition and plan for follow ups. Learned from discussion with PT and CSW that niece will be staying with the patient for the next few weeks. Asked the spouse about this and she stated that this was correct.  PT will continue to work with the patient until time of discharge. After discussion patient spouse stated that she felt more comfortable about discharge. Home health has been setup for PT RN and Aid.

## 2015-11-13 NOTE — Progress Notes (Addendum)
Privateer at [facility]                                                                                                                                                                                            Patient Demographics   Justin Leonard, is a 81 y.o. male, DOB - 01-05-1927, [id number]  Admit date - 11/05/2015   Admitting Physician Harrie Foreman, MD  Outpatient Primary MD for the patient is Olmedo, Guy Begin, MD   LOS - 8  Subjective; overall better. Had a BM yesterday and yesterday.. No abdominal pain. No cough. No wheezing. Had normal BM. No melena. Hemoglobin is good.  Review of Systems:   CONSTITUTIONAL: No documented fever.  Positive fatigue and weakness. No weight gain, no weight loss.  EYES: No blurry or double vision.  ENT: No tinnitus. No postnasal drip. No redness of the oropharynx.  RESPIRATORY:  faint expiratory wheeze in both lung zones. CARDIOVASCULAR: No chest pain. No orthopnea. No palpitations. No syncope.  GASTROINTESTINAL: No nausea, no vomiting or diarrhea. Right upper quadrant abdominal pain.does have constipation. Complains of constipation.  GENITOURINARY: No dysuria or hematuria. Have urinary retention status post Foley. ENDOCRINE: No polyuria or nocturia. No heat or cold intolerance.  HEMATOLOGY: No anemia. No bruising. No bleeding.  INTEGUMENTARY: No rashes. No lesions.  MUSCULOSKELETAL: No arthritis. No swelling. No gout.  NEUROLOGIC: No numbness, tingling, or ataxia. No seizure-type activity.  PSYCHIATRIC: No anxiety. No insomnia. No ADD.    Vitals:   Filed Vitals:   11/12/15 2001 11/12/15 2120 11/13/15 0500 11/13/15 0534  BP:  167/59  156/61  Pulse:  68  67  Temp:  98.4 F (36.9 C)  97.6 F (36.4 C)  TempSrc:  Oral  Oral  Resp:  16  19  Height:      Weight:   117.028 kg (258 lb)   SpO2: 96% 96%  98%    Wt Readings from Last 3 Encounters:  11/13/15 117.028 kg (258 lb)  09/30/15 110.678  kg (244 lb)  07/29/15 107.049 kg (236 lb)     Intake/Output Summary (Last 24 hours) at 11/13/15 1318 Last data filed at 11/13/15 1242  Gross per 24 hour  Intake    480 ml  Output   2321 ml  Net  -1841 ml    Physical Exam:   GENERAL: Pleasant-appearing in no apparent distress.  HEAD, EYES, EARS, NOSE AND THROAT: Atraumatic, normocephalic. Extraocular muscles are intact. Pupils equal and reactive to light. Sclerae anicteric. No conjunctival injection. No oro-pharyngeal erythema.  NECK: Supple. There is no jugular venous distention. No  bruits, no lymphadenopathy, no thyromegaly.  HEART: Irregularly irregular heart and rhythm,. No murmurs, no rubs, no clicks.  LUNGS: Very faint wheezes in both lung zones. . ABDOMEN: Soft, flat, nontender, nondistended. Has good bowel sounds. No hepatosplenomegaly appreciated.  In Right upper quadrant ,cholecystostomy tube present draining serosanguineous fluid. EXTREMITIES: No evidence of any cyanosis, clubbing, or peripheral edema.  +2 pedal and radial pulses bilaterally.  NEUROLOGIC: The patient is alert, awake, and oriented x3 with no focal motor or sensory deficits appreciated bilaterally.  SKIN: Moist and warm with no rashes appreciated.  Psych: Not anxious, depressed LN: No inguinal LN enlargement    Antibiotics   Anti-infectives    Start     Dose/Rate Route Frequency Ordered Stop   11/11/15 1000  Ampicillin-Sulbactam (UNASYN) 3 g in sodium chloride 0.9 % 100 mL IVPB     3 g 100 mL/hr over 60 Minutes Intravenous Every 6 hours 11/11/15 0950     11/08/15 1923  Ampicillin-Sulbactam (UNASYN) 3 g in sodium chloride 0.9 % 100 mL IVPB  Status:  Discontinued     3 g 100 mL/hr over 60 Minutes Intravenous Every 12 hours 11/08/15 0954 11/11/15 0950   11/06/15 1300  Ampicillin-Sulbactam (UNASYN) 3 g in sodium chloride 0.9 % 100 mL IVPB  Status:  Discontinued     3 g 100 mL/hr over 60 Minutes Intravenous Every 6 hours 11/06/15 1203 11/08/15 0954       Medications   Scheduled Meds: . ampicillin-sulbactam (UNASYN) IV  3 g Intravenous Q6H  . bisacodyl  10 mg Rectal Once  . FLUoxetine  20 mg Oral Daily  . insulin aspart  0-5 Units Subcutaneous QHS  . insulin aspart  0-9 Units Subcutaneous TID WC  . metoprolol tartrate  12.5 mg Oral BID  . pantoprazole (PROTONIX) IV  40 mg Intravenous Q12H  . pravastatin  20 mg Oral q1800  . senna-docusate  2 tablet Oral BID  . sodium chloride  3 mL Intravenous Q12H  . tamsulosin  0.4 mg Oral QPC supper  . tiotropium  1 capsule Inhalation Daily   Continuous Infusions:   PRN Meds:.acetaminophen **OR** acetaminophen, albuterol, hydrALAZINE, ipratropium-albuterol, morphine injection, nitroGLYCERIN, ondansetron **OR** ondansetron (ZOFRAN) IV, oxyCODONE-acetaminophen, promethazine   Data Review:   Micro Results Recent Results (from the past 240 hour(s))  MRSA PCR Screening     Status: None   Collection Time: 11/06/15  3:15 AM  Result Value Ref Range Status   MRSA by PCR NEGATIVE NEGATIVE Final    Comment:        The GeneXpert MRSA Assay (FDA approved for NASAL specimens only), is one component of a comprehensive MRSA colonization surveillance program. It is not intended to diagnose MRSA infection nor to guide or monitor treatment for MRSA infections.   Culture, blood (routine x 2)     Status: None   Collection Time: 11/06/15 12:35 PM  Result Value Ref Range Status   Specimen Description BLOOD RIGHT ASSIST CONTROL  Final   Special Requests   Final    BOTTLES DRAWN AEROBIC AND ANAEROBIC  [place] ANAERO [place] AERO   Culture NO GROWTH 5 DAYS  Final   Report Status 11/11/2015 FINAL  Final  Culture, blood (routine x 2)     Status: None   Collection Time: 11/06/15 12:38 PM  Result Value Ref Range Status   Specimen Description BLOOD LEFT HAND  Final   Special Requests BOTTLES DRAWN AEROBIC AND ANAEROBIC  3 CC  Final   Culture  NO GROWTH 5 DAYS  Final   Report Status 11/11/2015 FINAL  Final   Body fluid culture     Status: None   Collection Time: 11/07/15  3:40 PM  Result Value Ref Range Status   Specimen Description GALL BLADDER  Final   Special Requests NONE  Final   Gram Stain   Final    RARE WBC SEEN MANY GRAM POSITIVE RODS MANY GRAM NEGATIVE RODS    Culture   Final    HEAVY GROWTH ESCHERICHIA COLI NO ANAEROBES ISOLATED    Report Status 11/10/2015 FINAL  Final   Organism ID, Bacteria ESCHERICHIA COLI  Final      Susceptibility   Escherichia coli - MIC*    AMPICILLIN <=2 SENSITIVE Sensitive     CEFTAZIDIME <=1 SENSITIVE Sensitive     CEFAZOLIN <=4 SENSITIVE Sensitive     CEFTRIAXONE <=1 SENSITIVE Sensitive     CIPROFLOXACIN <=0.25 SENSITIVE Sensitive     GENTAMICIN <=1 SENSITIVE Sensitive     IMIPENEM <=0.25 SENSITIVE Sensitive     TRIMETH/SULFA <=20 SENSITIVE Sensitive     PIP/TAZO Value in next row Sensitive      SENSITIVE<=4    * HEAVY GROWTH ESCHERICHIA COLI    Radiology Reports Dg Chest 1 View  11/06/2015  CLINICAL DATA:  Shortness of breath and weakness. EXAM: CHEST 1 VIEW COMPARISON:  11/05/2015. FINDINGS: Trachea is midline. Heart is at the upper limits of normal in size to mildly enlarged. Thoracic aorta is calcified. There is basilar interstitial prominence and indistinctness, new. Small left pleural effusion also new. IMPRESSION: New basilar dependent interstitial prominence/indistinctness and small left pleural effusion, favoring congestive heart failure. Electronically Signed   By: Lorin Picket M.D.   On: 11/06/2015 13:22   Dg Chest 2 View  11/05/2015  CLINICAL DATA:  Acute onset of mid chest pain.  Initial encounter. EXAM: CHEST  2 VIEW COMPARISON:  Chest radiograph performed 07/15/2015 FINDINGS: The lungs are well-aerated. Vascular congestion is noted. Increased interstitial markings raise concern for mild pulmonary edema, though pneumonia could have a similar appearance. There is no evidence of pleural effusion or pneumothorax. The heart is  normal in size. The patient is status post median sternotomy, with evidence of prior CABG. No acute osseous abnormalities are seen. IMPRESSION: Vascular congestion noted. Increased interstitial markings raise concern for mild pulmonary edema, though pneumonia could have a similar appearance. Electronically Signed   By: Garald Balding M.D.   On: 11/05/2015 03:55   Dg Abd 1 View  11/09/2015  CLINICAL DATA:  Abdominal distention status post gallbladder surgery yesterday, no bowel movement for 2 days EXAM: ABDOMEN - 1 VIEW COMPARISON:  None. FINDINGS: There is mild to moderate gaseous distention of the stomach. There is air in small and large bowel with no abnormally dilated loops of bowel. There is no significant fecal retention. Pigtail catheter right upper quadrant noted. IMPRESSION: Nonobstructive bowel gas pattern. Mild to moderate gaseous distention of the stomach likely related to postoperative ileus. Electronically Signed   By: Skipper Cliche M.D.   On: 11/09/2015 19:14   Ct Abdomen Pelvis W Contrast  11/06/2015  CLINICAL DATA:  Chest pain at rest. Nauseated. Intermittent symptoms for 4 weeks. Nausea and diarrhea over Thanksgiving. Decreased hemoglobin. EXAM: CT ABDOMEN AND PELVIS WITH CONTRAST TECHNIQUE: Multidetector CT imaging of the abdomen and pelvis was performed using the standard protocol following bolus administration of intravenous contrast. CONTRAST:  125mL OMNIPAQUE IOHEXOL 300 MG/ML  SOLN COMPARISON:  None FINDINGS: Lower  chest: There is left lower lobe atelectasis. Coronary artery calcifications are present. Heart is mildly enlarged. No pericardial effusion. Upper abdomen: New no focal abnormality identified within the liver, spleen, pancreas, or right adrenal gland. Left adrenal adenoma measured/ 1.4 x 2.0 cm. There is symmetric enhancement of both kidneys. Small left lower pole cyst is identified. No hydronephrosis. The gallbladder is distended and has a thickened wall. There is  significant pericholecystic inflammatory change. Small amount of gas is identified in the nondependent gallbladder fundus, suspicious for gas-forming organisms. Gastrointestinal tract: The stomach and small bowel loops are normal in appearance. The appendix is normal in appearance. Numerous colonic diverticula are present. There is large stool burden, particularly within the rectosigmoid colon. Pelvis: Large prostate with prominent impression upon the bladder. Urinary bladder is normal in appearance. No free pelvic fluid. Retroperitoneum: Atherosclerotic calcification of the abdominal aorta and branches. No aneurysm. Abdominal wall: Small fat containing supraumbilical hernia Osseous structures: Significant lumbar spondylosis. No suspicious lytic or blastic lesions are identified. IMPRESSION: 1. Distended, thick-walled gallbladder with significant pericholecystic stranding. Gas within the gallbladder wall is suspicious for infection. Further evaluation with ultrasound is recommended. 2. Coronary artery disease. 3. Small left renal cyst. 4. Colonic diverticulosis.  Moderate stool burden. 5. Enlarged prostate. 6. Small fat containing supraumbilical hernia. Electronically Signed   By: Nolon Nations M.D.   On: 11/06/2015 14:05   Ct Image Guided Fluid Drain By Catheter  11/07/2015  CLINICAL DATA:  81 year old with acute cholecystitis. EXAM: CT-GUIDED CHOLECYSTOSTOMY TUBE PLACEMENT Physician: Stephan Minister. Anselm Pancoast, MD FLUOROSCOPY TIME:  None MEDICATIONS: 1 mg Versed, 25 mcg fentanyl. A radiology nurse monitored the patient for moderate sedation. ANESTHESIA/SEDATION: Moderate sedation time: 30 minutes PROCEDURE: The procedure was explained to the patient. The risks and benefits of the procedure were discussed and the patient's questions were addressed. Informed consent was obtained from the patient. The patient was placed supine on the CT scanner. Images through the abdomen were obtained. The right upper abdomen was prepped  and draped in sterile fashion. Maximal barrier sterile technique was utilized including mask, sterile gowns, sterile gloves, sterile drape, hand hygiene and skin antiseptic. The skin was anesthetized with 1% lidocaine. 18 gauge needle was directed into the gallbladder fundus with CT guidance. Stiff Amplatz wire was advanced into the gallbladder with CT guidance. The tract was dilated to accommodate a 10.2 [place] multipurpose drain. The drain was advanced into the gallbladder and 100 mL of cloudy brown fluid was aspirated. Catheter was sutured to skin and attached to gravity bag. FINDINGS: The gallbladder is distended with surrounding edema/ fluid and there is gas in the gallbladder fundus. Findings are consistent with acute cholecystitis. 100 mL of cloudy brown fluid was aspirated. Estimated blood loss: Minimal COMPLICATIONS: None IMPRESSION: Successful CT-guided cholecystostomy tube placement. Electronically Signed   By: Markus Daft M.D.   On: 11/07/2015 17:01   US Abdomen Limited Ruq  11/07/2015  CLINICAL DATA:  Right upper quadrant pain. EXAM: US ABDOMEN LIMITED - RIGHT UPPER QUADRANT COMPARISON:  CT 11/06/2015. FINDINGS: Gallbladder: Gallbladder is distended. Sludge is noted throughout the gallbladder. Gallbladder wall is thickened to 7 mm. Small amount of pericholecystic fluid noted. Positive Murphy sign. Findings are consistent with cholecystitis. Common bile duct: Diameter: 6 mm. Liver: No focal hepatic abnormality identified. Hepatic echotexture normal. Left hepatic lobe not well visualized due to overlying bowel gas. IMPRESSION: Distended gallbladder with sludge. Gallbladder wall thickening. Small amount of pericholecystic fluid. Positive Murphy sign. Findings consistent with gallbladder sludge with associated  cholecystitis. No biliary ductal distention . Electronically Signed   By: Marcello Moores  Register   On: 11/07/2015 10:04     CBC  Recent Labs Lab 11/07/15 0701 11/08/15 0515 11/09/15 0501  11/11/15 0508 11/12/15 0929  WBC 26.3* 15.9* 12.4* 10.2 9.6  HGB 8.7* 8.1* 7.5* 7.9* 8.0*  HCT 27.2* 25.3* 23.3* 24.9* 25.8*  PLT 335 313 274 313 333  MCV 81.7 81.7 81.2 81.0 81.8  MCH 26.0 26.0 26.1 25.7* 25.3*  MCHC 31.8* 31.9* 32.1 31.8* 30.9*  RDW 16.4* 16.2* 16.6* 16.4* 16.1*    Chemistries   Recent Labs Lab 11/08/15 0515 11/08/15 1257 11/10/15 0536 11/11/15 0509 11/12/15 0929  NA 138 136 135 137 140  K 4.3 4.1 4.0 4.1 4.5  CL 101 101 104 108 107  CO2 26 25 24 24 26   GLUCOSE 187* 177* 140* 125* 139*  BUN 53* 59* 69* 56* 33*  CREATININE 2.99* 3.31* 3.13* 2.05* 1.25*  CALCIUM 8.6* 8.6* 8.7* 9.0 9.4  AST 54* 44*  --   --   --   ALT 17 16*  --   --   --   ALKPHOS 53 52  --   --   --   BILITOT 0.5 0.6  --   --   --    ------------------------------------------------------------------------------------------------------------------ estimated creatinine clearance is 58.5 mL/min (by C-G formula based on Cr of 1.25). ------------------------------------------------------------------------------------------------------------------ No results for input(s): HGBA1C in the last 72 hours. ------------------------------------------------------------------------------------------------------------------ No results for input(s): CHOL, HDL, LDLCALC, TRIG, CHOLHDL, LDLDIRECT in the last 72 hours. ------------------------------------------------------------------------------------------------------------------ No results for input(s): TSH, T4TOTAL, T3FREE, THYROIDAB in the last 72 hours.  Invalid input(s): FREET3 ------------------------------------------------------------------------------------------------------------------ No results for input(s): VITAMINB12, FOLATE, FERRITIN, TIBC, IRON, RETICCTPCT in the last 72 hours.  Coagulation profile  Recent Labs Lab 11/07/15 1159  INR 1.39    No results for input(s): DDIMER in the last 72 hours.  Cardiac Enzymes  Recent  Labs Lab 11/06/15 1547  TROPONINI 0.06*   ------------------------------------------------------------------------------------------------------------------ Invalid input(s): POCBNP    Assessment & Plan    This is an 81 year old Caucasian male with past medical history significant for coronary artery disease status post CABG and PCI now has a GI bleed and symptomatic anemia.  #1 severe sepsis secondary to Escherichia coli: Improved after a cholecystostomy tube placement. WBC normalized. Continue Unasyn for broader coverage. Surgeries following for timing of cholecystectomy as an outpatient.  2. Atrial fibrillation; rate controlled well. Continue present dose of metoprolol. CAD with recent stent placement in [place] is on hold due to GI bleed.d/w DR.Fath and he spoke to family for now recommends holding ASA,Brillinta until we find the source of GI bleed/     3.obscure  GI bleed hold Brilinta: EGD did not show any ulcers. guiac positive stools.  will discuss with Dr. Candace Cruise today. Had melena on admission may be the reason for guaiac-positive stool. Hemoglobin is stable. The workup with GI as an outpatient for colonoscopy.  4. Nausea vomiting: resolved. Due to acute cholecystitis continue Unasyn , continue Zofran.   5. Chest pain: Likely anginal secondary to anemia. Troponin is negative.  Cards consult appreciated they feel that this is not an MI.  6. Acute on chronic systolic chf-we will resume the Lasix   7. Essential hypertension: Restarted metoprolol.   6. Diabetes mellitus type 2: Continue sliding scale insulin, t glucose is well controlled. Diabeta metformin contraindicated in renal failure. So we are not giving that.   7. COPD: Continue nebulizers. 7. Hyper Lipidemia: Continue  statin therapy  8. DVT prophylaxis: SCDs  9. GI prophylaxis: PPIs. #10 acute blood loss anemia on chronic anemia status post transfusion, hemoglobin stable.  anemia is due to GI bleed.  #11,  acute renal failure due to postobstructive uropathy with BPH: s/p foley.. Discussed with the urologist yesterday recommended  discharging him with Foley, follow-up with outpatient urology Continue flomax. Renal function improved. Discontinue IV fluids..    Discussed with  Wife. Says she cannot take care of him at home , will talk to case manager again for a disposition plan  #12 .constipation;improved. continue Dulcolax, Colace moret than 40% of the time spent in coordination of the care.     Code Status Orders        Start     Ordered   11/05/15 [id number]  Full code   Continuous     11/05/15 0638           Consults gi, cards  DVT Prophylaxis   SCDs    Lab Results  Component Value Date   PLT 333 11/12/2015     Time Spent in minutes 35 minutes of critical care time spent  [facility] M.D on 11/13/2015 at 1:18 PM  Between 7am to 6pm - Pager - [phone]  After 6pm go to [email or web address] - password [email or web address] [facility] [place]   Office  [phone]

## 2015-11-13 NOTE — Progress Notes (Signed)
Physical Therapy Treatment Patient Details Name: Justin Leonard MRN: [id number] DOB: Jan 06, 1927 Today's Date: 11/13/2015    History of Present Illness presented to ER seconadry to chest pain, dark/tarry stools; admitted secondary to GIB, course complicated by sepsis related to acalculous cholecystitis (requiring transfer to CCU and placement of cholecystecomy tube 12/9)    PT Comments    Progressive increase in gait distance (up to 140') with RW, cga/min assist for safety.  Mild trendelenburg pattern due to R hip weakness; baseline for patient per report.  Fair/good ability to pace activity and initiate rest periods as needed. Remains eager, motivated for mobility and discharge home as appropriate.   Follow Up Recommendations  Home health PT;Supervision/Assistance - 24 hour     Equipment Recommendations  Rolling walker with 5" wheels    Recommendations for Other Services       Precautions / Restrictions Precautions Precautions: Fall Precaution Comments: R cholecystectomy tube Restrictions Weight Bearing Restrictions: No    Mobility  Bed Mobility Overal bed mobility: Modified Independent                Transfers Overall transfer level: Needs assistance Equipment used: Rolling walker (2 wheeled) Transfers: Sit to/from Stand Sit to Stand: Min guard;Min assist         General transfer comment: cuing for hand placement to maximize safety/indep  Ambulation/Gait Ambulation/Gait assistance: Min guard;Min assist Ambulation Distance (Feet): 80 Feet (140) Assistive device: Rolling walker (2 wheeled)       General Gait Details: reciprocal stepping pattern with decreased strength/stability R posterior/lateral hip (mild trendelenburg pattern); no buckling or LOB.  Fair ability to pace activity and initiate rest periods as needed.   Stairs            Wheelchair Mobility    Modified Rankin (Stroke Patients Only)       Balance Overall balance  assessment: Needs assistance Sitting-balance support: No upper extremity supported;Feet supported Sitting balance-Leahy Scale: Good     Standing balance support: Bilateral upper extremity supported Standing balance-Leahy Scale: Fair                      Cognition Arousal/Alertness: Awake/alert Behavior During Therapy: WFL for tasks assessed/performed Overall Cognitive Status: Within Functional Limits for tasks assessed                      Exercises Other Exercises Other Exercises: Seated LE therex, 1x15, AROM for muscular strength/endurance with functional activities.  Cuing for slower, controlled motions to maximize effectiveness of therex    General Comments        Pertinent Vitals/Pain Pain Assessment: No/denies pain    Home Living                      Prior Function            PT Goals (current goals can now be found in the care plan section) Acute Rehab PT Goals Patient Stated Goal: "to go home" PT Goal Formulation: With patient/family Time For Goal Achievement: 11/26/15 Potential to Achieve Goals: Good Progress towards PT goals: Progressing toward goals    Frequency  Min 2X/week    PT Plan Current plan remains appropriate    Co-evaluation             End of Session Equipment Utilized During Treatment: Gait belt Activity Tolerance: Patient tolerated treatment well Patient left: in chair;with call bell/phone within reach;with chair alarm set;with family/visitor  present     Time: [id number] PT Time Calculation (min) (ACUTE ONLY): 33 min  Charges:  $Gait Training: 8-22 mins $Therapeutic Exercise: 8-22 mins                    G Codes:       Eydan Chianese H. Owens Shark, PT, DPT, NCS 11/13/2015, 3:46 PM [phone]

## 2015-11-14 LAB — GLUCOSE, CAPILLARY
Glucose-Capillary: 162 mg/dL — ABNORMAL HIGH (ref 65–99)
Glucose-Capillary: 188 mg/dL — ABNORMAL HIGH (ref 65–99)

## 2015-11-14 MED ORDER — AMOXICILLIN-POT CLAVULANATE 875-125 MG PO TABS: 1.0000 | ORAL_TABLET | Freq: Two times a day (BID) | ORAL | Status: DC

## 2015-11-14 MED ORDER — FUROSEMIDE 40 MG PO TABS: 20.0000 mg | ORAL_TABLET | Freq: Every day | ORAL | Status: DC

## 2015-11-14 MED ORDER — SENNOSIDES-DOCUSATE SODIUM 8.6-50 MG PO TABS: 2.0000 | ORAL_TABLET | Freq: Two times a day (BID) | ORAL | Status: DC

## 2015-11-14 MED ORDER — METOPROLOL SUCCINATE ER 25 MG PO TB24: 25.0000 mg | ORAL_TABLET | Freq: Two times a day (BID) | ORAL | Status: DC

## 2015-11-14 MED ORDER — INSULIN ASPART 100 UNIT/ML ~~LOC~~ SOLN: 0.0000 [IU] | Freq: Three times a day (TID) | SUBCUTANEOUS | Status: DC

## 2015-11-14 MED ORDER — PANTOPRAZOLE SODIUM 40 MG PO TBEC: 40.0000 mg | DELAYED_RELEASE_TABLET | Freq: Two times a day (BID) | ORAL | Status: DC

## 2015-11-14 MED ORDER — OXYCODONE-ACETAMINOPHEN 5-325 MG PO TABS: 1.0000 | ORAL_TABLET | Freq: Four times a day (QID) | ORAL | Status: DC | PRN

## 2015-11-14 MED ORDER — BISACODYL 10 MG RE SUPP: 10.0000 mg | Freq: Once | RECTAL | Status: DC

## 2015-11-14 MED ORDER — TAMSULOSIN HCL 0.4 MG PO CAPS: 0.4000 mg | ORAL_CAPSULE | Freq: Every day | ORAL | Status: DC

## 2015-11-14 NOTE — Care Management Important Message (Signed)
Important Message  Patient Details  Name: Justin Leonard MRN: [id number] Date of Birth: [birth year]   Medicare Important Message Given:  Yes    [name] 11/14/2015, 11:25 AM

## 2015-11-14 NOTE — Progress Notes (Signed)
PHARMACIST - PHYSICIAN COMMUNICATION DR:   Vianne Bulls CONCERNING: IV to Oral Route Change Policy  RECOMMENDATION: This patient is receiving PANTOPRAZOLE by the intravenous route.  Based on criteria approved by the Pharmacy and Therapeutics Committee, the intravenous medication(s) is/are being converted to the equivalent oral dose form(s).   DESCRIPTION: These criteria include:  The patient is eating (either orally or via tube) and/or has been taking other orally administered medications for a least 24 hours  The patient has no evidence of active gastrointestinal bleeding or impaired GI absorption (gastrectomy, short bowel, patient on TNA or NPO).  If you have questions about this conversion, please contact the Pharmacy Department  []   [phone] )  [facility] [x]   [phone] )  [facility] []   [phone] )  [facility] []   [phone] )  [facility] []   [phone] )  [place] PharmD Clinical Pharmacist 11/14/2015  11:01 AM

## 2015-11-14 NOTE — Telephone Encounter (Signed)
Done ° ° °Justin Leonard °

## 2015-11-14 NOTE — Progress Notes (Signed)
CC: Cholecystitis Subjective: Patient continues to do well. Denies any abdominal pain. Per patient and family he is likely being discharged to skilled nursing facility.  Objective: Vital signs in last 24 hours: Temp:  [97.6 F (36.4 C)-98.2 F (36.8 C)] 98.2 F (36.8 C) (12/16 1243) Pulse Rate:  [65-67] 67 (12/16 1243) Resp:  [14-18] 18 (12/16 1243) BP: (164-181)/(65-70) 174/68 mmHg (12/16 1243) SpO2:  [91 %-97 %] 97 % (12/16 1243) Weight:  [114.851 kg (253 lb 3.2 oz)] 114.851 kg (253 lb 3.2 oz) (12/16 0557) Last BM Date: 11/13/15  Intake/Output from previous day: 12/15 0701 - 12/16 0700 In: 720 [P.O.:720] Out: 2047 [Urine:1975; Drains:70; Stool:2] Intake/Output this shift: Total I/O In: 360 [P.O.:360] Out: 500 [Urine:500]  Physical exam:  Abdomen is soft, nontender, nondistended with precarious. The drain in place.  Lab Results: CBC   Recent Labs  11/12/15 0929  WBC 9.6  HGB 8.0*  HCT 25.8*  PLT 333   BMET  Recent Labs  11/12/15 0929  NA 140  K 4.5  CL 107  CO2 26  GLUCOSE 139*  BUN 33*  CREATININE 1.25*  CALCIUM 9.4   PT/INR No results for input(s): LABPROT, INR in the last 72 hours. ABG No results for input(s): PHART, HCO3 in the last 72 hours.  Invalid input(s): PCO2, PO2  Studies/Results: No results found.  Anti-infectives: Anti-infectives    Start     Dose/Rate Route Frequency Ordered Stop   11/14/15 0000  amoxicillin-clavulanate (AUGMENTIN) 875-125 MG tablet     1 tablet Oral 2 times daily 11/14/15 1014     11/11/15 1000  Ampicillin-Sulbactam (UNASYN) 3 g in sodium chloride 0.9 % 100 mL IVPB     3 g 100 mL/hr over 60 Minutes Intravenous Every 6 hours 11/11/15 0950     11/08/15 1923  Ampicillin-Sulbactam (UNASYN) 3 g in sodium chloride 0.9 % 100 mL IVPB  Status:  Discontinued     3 g 100 mL/hr over 60 Minutes Intravenous Every 12 hours 11/08/15 0954 11/11/15 0950   11/06/15 1300  Ampicillin-Sulbactam (UNASYN) 3 g in sodium chloride 0.9  % 100 mL IVPB  Status:  Discontinued     3 g 100 mL/hr over 60 Minutes Intravenous Every 6 hours 11/06/15 1203 11/08/15 0954      Assessment/Plan:  Patient doing well with his cholecystostomy drain. He'll need follow-up in clinic in the coming weeks during which time investigation of the cholecystostomy drain will be ordered and obtained. Okay for discharge per the surgical services.Juanda Crumble T. Adonis Huguenin, MD, FACS  11/14/2015

## 2015-11-14 NOTE — Discharge Summary (Signed)
Justin Leonard, is a 81 y.o. male  DOB 1927-01-08  MRN [id number].  Admission date:  11/05/2015  Admitting Physician  Harrie Foreman, MD  Discharge Date:  11/14/2015   Primary MD  [name], MD  Recommendations for primary care physician for things to follow:  Follow-up with cardiology, urology, surgery, GI.    Admission Diagnosis  Chest pain, unspecified chest pain type [R07.9] Gastrointestinal hemorrhage, unspecified gastritis, unspecified gastrointestinal hemorrhage type [K92.2]   Discharge Diagnosis  Chest pain, unspecified chest pain type [R07.9] Gastrointestinal hemorrhage, unspecified gastritis, unspecified gastrointestinal hemorrhage type [K92.2]    Active Problems:   GI bleed   Acute cholecystitis   Bleeding gastrointestinal      Past Medical History  Diagnosis Date  . CHF (congestive heart failure) ([place])   . Hypertension   . Diabetes mellitus without complication ([place])   . Coronary artery disease   . Hyperlipidemia   . Deafness   . Peripheral neuropathy ([place])   . Back pain   . Umbilical hernia   . Depression   . Melena   . COPD (chronic obstructive pulmonary disease) ([place])   . Skin cancer     Past Surgical History  Procedure Laterality Date  . Coronary artery bypass graft    . Cardiac catheterization Left 07/17/2015    Procedure: Right/Left Heart Cath and Coronary Angiography;  Surgeon: [name], MD;  Location: [facility];  Service: Cardiovascular;  Laterality: Left;  . Cardiac catheterization N/A 07/17/2015    Procedure: Coronary Stent Intervention;  Surgeon: [name], MD;  Location: [facility];  Service: Cardiovascular;  Laterality: N/A;  . Coronary angioplasty    . Esophagogastroduodenoscopy N/A 11/10/2015    Procedure:  ESOPHAGOGASTRODUODENOSCOPY (EGD);  Surgeon: [name], MD;  Location: [facility] ENDOS[facility];  Service: Endoscopy;  Laterality: N/A;       History of present illness and  Hospital Course:     Kindly see H&P for history of present illness and admission details, please review complete Labs, Consult reports and Test reports for all details in brief  HPI  from the history and physical done on the day of admission 81 year old male patient came in because of chest pain. Also had nausea, diarrhea with dark colored stools. Admitted for acute on chronic anemia with GI bleed.  d. [facility] Course   1 GI bleed with melena initially thought to have upper GI bleed: Patient  Was on [place]. Both were stopped.hemoglobin 7.1 on admission with a hematocrit 22.8 .  In [place]  patient hemoglobin was 9.5. Patient received 2 units of packed RBC. Hemoglobin  improved to 8.9. Seen by gastroenterology Dr. Candace Cruise.. Patient was given IV Protonix. An anemia workup done showed iron level is 23. With iron binding capacity 412. Patient is to be off [place] For 5 days for EGD.  EGD on Monday ,DEc11th. He was unremarkable with no evidence of ulcer or gastritis. Did not have any further melena, further drop in hemoglobin. Hemoglobin 8.0 on December 14. Patient is not started on Brillinta. As pt  stable hemodynamically with  Out  further drop in hemoglobin, patient is a supposed to follow with GI as an outpatient for possible colonoscopy. For now he'll be on aspirin as per cardiology recommendation.  2 history of coronary artery disease with recent stent placement in August: Seen by cardiology Dr. Enos Fling,. She had slightly elevated troponins of 0.06. Cardiology did not think it's acute MI.Marland Kitchen  However because of anemia and GI bleed issues,Brillinta  not restarted. I spoke with Dr. Jacqualine Code recommended to continue aspirin alone for now. Will follow with GI Dr. as an outpatient. Patient will have CBC again in 2-3 days. If  hemoglobin is stable he will see GI as an outpatient and cardiology as an outpatient to resume aspirin,  3.ECOLI SEPSIS; due to acute cholecystitis. And developed right upper quadrant pain, nausea. Answer to ICU on eighth of 4 December because of hypertension and tachycardia and right upper quadrant pain. Abdominal CAT scan showed acute cholecystitis. Seen by Dr. Burt Knack. Patient had ultrasound-guided cholecystostomy tube placement. CT showed gas bubbles in the gallbladder. He had elevated white count. Cultures from cholecystostomy tube showed an Escherichia coli. Patient was given Unasyn till [place]. And discharge him to [facility] home with Augmentin for 5 more days. Does have cholecystostomy tube, followed by surgery every day. The plan is to continue cholecystostomy tube, and the drain. And follow-up with surgery in now 2-3 weeks for timing of cholecystectomy.  #6. Acute renal failure: Likely due to sepsis, BPH. Patient seen by nephrology, received IV hydration. Patient had contrast nephropathy, improved with IV hydration.  Started the Lasix very low dose. #7 urine retention with BPH: Patient CT abdomen showed prostate hypertrophy,, started on Flomax. Had  Urine retention up to 900 mL. So we inserted Foley catheter, obtain neurology consult. Patient will go to rehabilitation with Foley and follow up with the Dr. Gena Fray  in  2 weeks for voiding trial. #8 atrial fibrillation with RVR: Patient developed A. fib with RVR transiently during the episode of sepsis: Continue IV amiodarone drip. And he is off amiodarone drip and mandible are regular floor. Right now he is only on now metoprolol for rate control. On metoprolol 25 mg by mouth twice a day. #9 deconditioning physical therapy recommended home health but the wife is very old unable to care for him at home and family is really concerned about going home. And social worker arranged the nursing home and patient family chose to [facility] #10  diabetes mellitus type 2: Diabetic on metformin were stopped because of renal failure. Continue sliding scale with coverage only. #11 nausea vomiting due to cholecystitis resolved and patient is tolerating the diet. #12 COPD patient has no wheezing at this time continue his home nebulizers. Acute on chronic systolic heart failure patient Lasix was held because of renal failure and sepsis but the restarted very low dose at discharge.  Discharge Condition: stable   Follow UP  Follow-up Information    Follow up with [facility] In 2 weeks.   Specialty:  Urology   Why:  Foley removal   Contact information:   [address] [place] [phone]      Follow up with [facility]. Schedule an appointment as soon as possible for a visit in 2 weeks.   Specialty:  General Surgery   Contact information:   [address] [place] [phone]        Discharge Instructions  and  Discharge Medications   Follow up with Dr. Ubaldo Glassing in 2 weeks Follow-up with Dr. Candace Cruise 2-3 weeks. With urology in 2 weeks    Medication List    STOP taking these medications        glyBURIDE 5 MG tablet  Commonly known as:  DIABETA     lisinopril 40 MG tablet  Commonly known as:  PRINIVIL,ZESTRIL     metFORMIN 500 MG tablet  Commonly known as:  GLUCOPHAGE     metoprolol 50 MG tablet  Commonly known as:  LOPRESSOR     ticagrelor 90 MG Tabs tablet  Commonly known as:  BRILINTA      TAKE these medications        acetaminophen 325 MG tablet  Commonly known as:  TYLENOL  Take 650 mg by mouth every 6 (six) hours as needed.     amoxicillin-clavulanate 875-125 MG tablet  Commonly known as:  AUGMENTIN  Take 1 tablet by mouth 2 (two) times daily.     aspirin EC 81 MG tablet  Take 81 mg by mouth daily.     bisacodyl 10 MG suppository  Commonly known as:  DULCOLAX  Place 1  suppository (10 mg total) rectally once.     FLUoxetine 20 MG capsule  Commonly known as:  PROZAC  Take 20 mg by mouth daily.     furosemide 40 MG tablet  Commonly known as:  LASIX  Take 0.5 tablets (20 mg total) by mouth daily.     insulin aspart 100 UNIT/ML injection  Commonly known as:  novoLOG  Inject 0-9 Units into the skin 3 (three) times daily with meals.     lovastatin 20 MG tablet  Commonly known as:  MEVACOR  Take 20 mg by mouth at bedtime.     metoprolol succinate 25 MG 24 hr tablet  Commonly known as:  TOPROL XL  Take 1 tablet (25 mg total) by mouth 2 (two) times daily.     nitroGLYCERIN 0.4 MG SL tablet  Commonly known as:  NITROSTAT  Place 1 tablet under the tongue every 5 (five) minutes as needed.     oxyCODONE-acetaminophen 5-325 MG tablet  Commonly known as:  PERCOCET/ROXICET  Take 1-2 tablets by mouth every 6 (six) hours as needed for moderate pain.     pantoprazole 40 MG tablet  Commonly known as:  PROTONIX  Take 40 mg by mouth daily.     senna-docusate 8.6-50 MG tablet  Commonly known as:  Senokot-S  Take 2 tablets by mouth 2 (two) times daily.     SPIRIVA RESPIMAT 2.5 MCG/ACT Aers  Generic drug:  Tiotropium Bromide Monohydrate  Inhale 2 puffs into the lungs daily.     tamsulosin 0.4 MG Caps capsule  Commonly known as:  FLOMAX  Take 1 capsule (0.4 mg total) by mouth daily after supper.     VENTOLIN HFA 108 (90 BASE) MCG/ACT inhaler  Generic drug:  albuterol  Inhale 1-2 puffs into the lungs as needed.          Diet and Activity recommendation: See Discharge Instructions above   Consults obtained - GI, cardiology, surgery,urology,nephrology   Major procedures and Radiology Reports - PLEASE review detailed and final reports for all details, in brief -    Dg Chest 1 View  11/06/2015  CLINICAL DATA:  Shortness of breath and weakness. EXAM: CHEST 1 VIEW COMPARISON:  11/05/2015. FINDINGS: Trachea is midline. Heart is at the upper limits of  normal in size to mildly enlarged. Thoracic aorta is calcified. There is basilar interstitial prominence and indistinctness, new. Small left pleural effusion also new. IMPRESSION: New basilar dependent interstitial prominence/indistinctness and small left pleural effusion, favoring congestive heart failure. Electronically Signed   By: Lorin Picket M.D.   On: 11/06/2015 13:22   Dg Chest 2 View  11/05/2015  CLINICAL DATA:  Acute onset of mid  chest pain.  Initial encounter. EXAM: CHEST  2 VIEW COMPARISON:  Chest radiograph performed 07/15/2015 FINDINGS: The lungs are well-aerated. Vascular congestion is noted. Increased interstitial markings raise concern for mild pulmonary edema, though pneumonia could have a similar appearance. There is no evidence of pleural effusion or pneumothorax. The heart is normal in size. The patient is status post median sternotomy, with evidence of prior CABG. No acute osseous abnormalities are seen. IMPRESSION: Vascular congestion noted. Increased interstitial markings raise concern for mild pulmonary edema, though pneumonia could have a similar appearance. Electronically Signed   By: Garald Balding M.D.   On: 11/05/2015 03:55   Dg Abd 1 View  11/09/2015  CLINICAL DATA:  Abdominal distention status post gallbladder surgery yesterday, no bowel movement for 2 days EXAM: ABDOMEN - 1 VIEW COMPARISON:  None. FINDINGS: There is mild to moderate gaseous distention of the stomach. There is air in small and large bowel with no abnormally dilated loops of bowel. There is no significant fecal retention. Pigtail catheter right upper quadrant noted. IMPRESSION: Nonobstructive bowel gas pattern. Mild to moderate gaseous distention of the stomach likely related to postoperative ileus. Electronically Signed   By: Skipper Cliche M.D.   On: 11/09/2015 19:14   Ct Abdomen Pelvis W Contrast  11/06/2015  CLINICAL DATA:  Chest pain at rest. Nauseated. Intermittent symptoms for 4 weeks. Nausea and  diarrhea over Thanksgiving. Decreased hemoglobin. EXAM: CT ABDOMEN AND PELVIS WITH CONTRAST TECHNIQUE: Multidetector CT imaging of the abdomen and pelvis was performed using the standard protocol following bolus administration of intravenous contrast. CONTRAST:  171mL OMNIPAQUE IOHEXOL 300 MG/ML  SOLN COMPARISON:  None FINDINGS: Lower chest: There is left lower lobe atelectasis. Coronary artery calcifications are present. Heart is mildly enlarged. No pericardial effusion. Upper abdomen: New no focal abnormality identified within the liver, spleen, pancreas, or right adrenal gland. Left adrenal adenoma measured/ 1.4 x 2.0 cm. There is symmetric enhancement of both kidneys. Small left lower pole cyst is identified. No hydronephrosis. The gallbladder is distended and has a thickened wall. There is significant pericholecystic inflammatory change. Small amount of gas is identified in the nondependent gallbladder fundus, suspicious for gas-forming organisms. Gastrointestinal tract: The stomach and small bowel loops are normal in appearance. The appendix is normal in appearance. Numerous colonic diverticula are present. There is large stool burden, particularly within the rectosigmoid colon. Pelvis: Large prostate with prominent impression upon the bladder. Urinary bladder is normal in appearance. No free pelvic fluid. Retroperitoneum: Atherosclerotic calcification of the abdominal aorta and branches. No aneurysm. Abdominal wall: Small fat containing supraumbilical hernia Osseous structures: Significant lumbar spondylosis. No suspicious lytic or blastic lesions are identified. IMPRESSION: 1. Distended, thick-walled gallbladder with significant pericholecystic stranding. Gas within the gallbladder wall is suspicious for infection. Further evaluation with ultrasound is recommended. 2. Coronary artery disease. 3. Small left renal cyst. 4. Colonic diverticulosis.  Moderate stool burden. 5. Enlarged prostate. 6. Small fat  containing supraumbilical hernia. Electronically Signed   By: Nolon Nations M.D.   On: 11/06/2015 14:05   Ct Image Guided Fluid Drain By Catheter  11/07/2015  CLINICAL DATA:  81 year old with acute cholecystitis. EXAM: CT-GUIDED CHOLECYSTOSTOMY TUBE PLACEMENT Physician: Stephan Minister. Anselm Pancoast, MD FLUOROSCOPY TIME:  None MEDICATIONS: 1 mg Versed, 25 mcg fentanyl. A radiology nurse monitored the patient for moderate sedation. ANESTHESIA/SEDATION: Moderate sedation time: 30 minutes PROCEDURE: The procedure was explained to the patient. The risks and benefits of the procedure were discussed and the patient's questions were addressed. Informed consent  was obtained from the patient. The patient was placed supine on the CT scanner. Images through the abdomen were obtained. The right upper abdomen was prepped and draped in sterile fashion. Maximal barrier sterile technique was utilized including mask, sterile gowns, sterile gloves, sterile drape, hand hygiene and skin antiseptic. The skin was anesthetized with 1% lidocaine. 18 gauge needle was directed into the gallbladder fundus with CT guidance. Stiff Amplatz wire was advanced into the gallbladder with CT guidance. The tract was dilated to accommodate a 10.2 [place] multipurpose drain. The drain was advanced into the gallbladder and 100 mL of cloudy brown fluid was aspirated. Catheter was sutured to skin and attached to gravity bag. FINDINGS: The gallbladder is distended with surrounding edema/ fluid and there is gas in the gallbladder fundus. Findings are consistent with acute cholecystitis. 100 mL of cloudy brown fluid was aspirated. Estimated blood loss: Minimal COMPLICATIONS: None IMPRESSION: Successful CT-guided cholecystostomy tube placement. Electronically Signed   By: Markus Daft M.D.   On: 11/07/2015 17:01   US Abdomen Limited Ruq  11/07/2015  CLINICAL DATA:  Right upper quadrant pain. EXAM: US ABDOMEN LIMITED - RIGHT UPPER QUADRANT COMPARISON:  CT 11/06/2015.  FINDINGS: Gallbladder: Gallbladder is distended. Sludge is noted throughout the gallbladder. Gallbladder wall is thickened to 7 mm. Small amount of pericholecystic fluid noted. Positive Murphy sign. Findings are consistent with cholecystitis. Common bile duct: Diameter: 6 mm. Liver: No focal hepatic abnormality identified. Hepatic echotexture normal. Left hepatic lobe not well visualized due to overlying bowel gas. IMPRESSION: Distended gallbladder with sludge. Gallbladder wall thickening. Small amount of pericholecystic fluid. Positive Murphy sign. Findings consistent with gallbladder sludge with associated cholecystitis. No biliary ductal distention . Electronically Signed   By: Marcello Moores  Register   On: 11/07/2015 10:04    Micro Results    Recent Results (from the past 240 hour(s))  MRSA PCR Screening     Status: None   Collection Time: 11/06/15  3:15 AM  Result Value Ref Range Status   MRSA by PCR NEGATIVE NEGATIVE Final    Comment:        The GeneXpert MRSA Assay (FDA approved for NASAL specimens only), is one component of a comprehensive MRSA colonization surveillance program. It is not intended to diagnose MRSA infection nor to guide or monitor treatment for MRSA infections.   Culture, blood (routine x 2)     Status: None   Collection Time: 11/06/15 12:35 PM  Result Value Ref Range Status   Specimen Description BLOOD RIGHT ASSIST CONTROL  Final   Special Requests   Final    BOTTLES DRAWN AEROBIC AND ANAEROBIC  [place] ANAERO [place] AERO   Culture NO GROWTH 5 DAYS  Final   Report Status 11/11/2015 FINAL  Final  Culture, blood (routine x 2)     Status: None   Collection Time: 11/06/15 12:38 PM  Result Value Ref Range Status   Specimen Description BLOOD LEFT HAND  Final   Special Requests BOTTLES DRAWN AEROBIC AND ANAEROBIC  3 CC  Final   Culture NO GROWTH 5 DAYS  Final   Report Status 11/11/2015 FINAL  Final  Body fluid culture     Status: None   Collection Time: 11/07/15  3:40 PM   Result Value Ref Range Status   Specimen Description GALL BLADDER  Final   Special Requests NONE  Final   Gram Stain   Final    RARE WBC SEEN MANY GRAM POSITIVE RODS MANY GRAM NEGATIVE RODS  Culture   Final    HEAVY GROWTH ESCHERICHIA COLI NO ANAEROBES ISOLATED    Report Status 11/10/2015 FINAL  Final   Organism ID, Bacteria ESCHERICHIA COLI  Final      Susceptibility   Escherichia coli - MIC*    AMPICILLIN <=2 SENSITIVE Sensitive     CEFTAZIDIME <=1 SENSITIVE Sensitive     CEFAZOLIN <=4 SENSITIVE Sensitive     CEFTRIAXONE <=1 SENSITIVE Sensitive     CIPROFLOXACIN <=0.25 SENSITIVE Sensitive     GENTAMICIN <=1 SENSITIVE Sensitive     IMIPENEM <=0.25 SENSITIVE Sensitive     TRIMETH/SULFA <=20 SENSITIVE Sensitive     PIP/TAZO Value in next row Sensitive      SENSITIVE<=4    * HEAVY GROWTH ESCHERICHIA COLI       Leonard   Subjective:   Justin Leonard has no headache,no chest abdominal pain,no new weakness tingling or numbness, stabe to go rehabilitation Leonard.  Objective:   Blood pressure 174/68, pulse 67, temperature 98.2 F (36.8 C), temperature source Oral, resp. rate 18, height 6\' 3"  (1.905 m), weight 114.851 kg (253 lb 3.2 oz), SpO2 97 %.   Intake/Output Summary (Last 24 hours) at 11/14/15 1302 Last data filed at 11/14/15 1045  Gross per 24 hour  Intake    600 ml  Output   1871 ml  Net  -1271 ml    Exam Awake Alert, Oriented x 3, No new F.N deficits, Normal affect [place].AT,PERRAL Supple Neck,No JVD, No cervical lymphadenopathy appriciated.  Symmetrical Chest wall movement, Good air movement bilaterally, CTAB RRR,No Gallops,Rubs or new Murmurs, No Parasternal Heave +ve B.Sounds, Abd Soft, Non tender, No organomegaly appriciated, No rebound -guarding or rigidity. No Cyanosis, Clubbing or edema, No new Rash or bruise  Data Review   CBC w Diff: Lab Results  Component Value Date   WBC 9.6 11/12/2015   WBC 8.8 06/24/2014   HGB 8.0* 11/12/2015   HGB  11.8* 06/24/2014   HCT 25.8* 11/12/2015   HCT 35.6* 06/24/2014   PLT 333 11/12/2015   PLT 253 06/24/2014   LYMPHOPCT 11 07/15/2015   MONOPCT 8 07/15/2015   EOSPCT 2 07/15/2015   BASOPCT 1 07/15/2015    CMP: Lab Results  Component Value Date   NA 140 11/12/2015   NA 139 06/24/2014   K 4.5 11/12/2015   K 4.6 06/24/2014   CL 107 11/12/2015   CL 106 06/24/2014   CO2 26 11/12/2015   CO2 26 06/24/2014   BUN 33* 11/12/2015   BUN 22* 06/24/2014   CREATININE 1.25* 11/12/2015   CREATININE 1.45* 06/24/2014   PROT 6.4* 11/08/2015   PROT 7.1 06/24/2014   ALBUMIN 2.7* 11/08/2015   ALBUMIN 3.4 06/24/2014   BILITOT 0.6 11/08/2015   BILITOT 0.4 06/24/2014   ALKPHOS 52 11/08/2015   ALKPHOS 71 06/24/2014   AST 44* 11/08/2015   AST 23 06/24/2014   ALT 16* 11/08/2015   ALT 18 06/24/2014  .   Total Time in preparing paper work, data evaluation and todays exam - 37 minutes  Riyaan Heroux M.D on 11/14/2015 at 1:02 PM    Note: This dictation was prepared with Dragon dictation along with smaller phrase technology. Any transcriptional errors that result from this process are unintentional.

## 2015-11-14 NOTE — Progress Notes (Signed)
Called report to facility and EMS for transport.

## 2015-11-14 NOTE — NC FL2 (Signed)
[place] LEVEL OF CARE SCREENING TOOL     IDENTIFICATION  Patient Name: Justin Leonard Birthdate: 07/18/1927 Sex: male Admission Date (Current Location): 11/05/2015  [place] and [place] Number: Engineering geologist and Address:  [facility], [address] [place], [place] [address]      Provider Number: [id number]  Attending Physician Name and Address:  Epifanio Lesches, MD  Relative Name and Phone Number:       Current Level of Care: Hospital Recommended Level of Care: [place] Prior Approval Number:    Date Approved/Denied:   PASRR Number:    Discharge Plan: SNF    Current Diagnoses: Patient Active Problem List   Diagnosis Date Noted  . Bleeding gastrointestinal   . Acute cholecystitis   . GI bleed 11/05/2015  . Bradycardia 07/30/2015  . COPD exacerbation ([place]) 07/15/2015  . Acute respiratory failure with hypoxia ([place]) 07/15/2015  . Acute on chronic diastolic CHF (congestive heart failure) ([place]) 07/15/2015  . Elevated troponin 07/15/2015  . Chronic diastolic heart failure ([place]) 04/17/2015  . HTN (hypertension) 04/17/2015  . Diabetes ([place]) 04/17/2015  . COPD (chronic obstructive pulmonary disease) ([place]) 04/17/2015    Orientation RESPIRATION BLADDER Height & Weight    Self, Time, Situation, Place  O2 Indwelling catheter 6\' 3"  (190.5 cm) 253 lbs.  BEHAVIORAL SYMPTOMS/MOOD NEUROLOGICAL BOWEL NUTRITION STATUS      Continent Diet (2gram sodium, carb modified)  AMBULATORY STATUS COMMUNICATION OF NEEDS Skin   Limited Assist Verbally Normal                       Personal Care Assistance Level of Assistance  Bathing, Feeding Bathing Assistance: Limited assistance Feeding assistance: Limited assistance Dressing Assistance: Limited assistance     Functional Limitations Info  Sight, Hearing, Speech Sight Info: Impaired Hearing Info: Impaired Speech Info: Adequate    SPECIAL CARE FACTORS  FREQUENCY  Bowel and bladder program, PT (By licensed PT), OT (By licensed OT)     PT Frequency: 5x week OT Frequency: 3x week Bowel and Bladder Program Frequency: cholecystostomy tube, tube drained once a shift. No bag change necessary.           Contractures Contractures Info: Not present    Additional Factors Info  Code Status, Allergies Code Status Info: Full Code Allergies Info: NKA           Current Medications (11/14/2015):  This is the current hospital active medication list Current Facility-Administered Medications  Medication Dose Route Frequency Provider Last Rate Last Dose  . acetaminophen (TYLENOL) tablet 650 mg  650 mg Oral Q6H PRN Harrie Foreman, MD       Or  . acetaminophen (TYLENOL) suppository 650 mg  650 mg Rectal Q6H PRN Harrie Foreman, MD   650 mg at 11/06/15 2155  . albuterol (PROVENTIL) (2.5 MG/3ML) 0.083% nebulizer solution 3 mL  3 mL Inhalation PRN Harrie Foreman, MD      . Ampicillin-Sulbactam (UNASYN) 3 g in sodium chloride 0.9 % 100 mL IVPB  3 g Intravenous Q6H Epifanio Lesches, MD   3 g at 11/14/15 0901  . bisacodyl (DULCOLAX) suppository 10 mg  10 mg Rectal Once Epifanio Lesches, MD   10 mg at 11/12/15 1200  . FLUoxetine (PROZAC) capsule 20 mg  20 mg Oral Daily Harrie Foreman, MD   20 mg at 11/14/15 0900  . hydrALAZINE (APRESOLINE) injection 10 mg  10 mg Intravenous Q6H PRN  Dustin Flock, MD      . insulin aspart (novoLOG) injection 0-5 Units  0-5 Units Subcutaneous QHS Theodoro Grist, MD   0 Units at 11/10/15 2126  . insulin aspart (novoLOG) injection 0-9 Units  0-9 Units Subcutaneous TID WC Theodoro Grist, MD   2 Units at 11/14/15 0857  . ipratropium-albuterol (DUONEB) 0.5-2.5 (3) MG/3ML nebulizer solution 3 mL  3 mL Nebulization Q4H PRN Epifanio Lesches, MD      . metoprolol tartrate (LOPRESSOR) tablet 12.5 mg  12.5 mg Oral BID Epifanio Lesches, MD   12.5 mg at 11/14/15 0901  . morphine 2 MG/ML injection 1 mg  1 mg  Intravenous Q1H PRN Markus Daft, MD   1 mg at 11/07/15 1736  . nitroGLYCERIN (NITROSTAT) SL tablet 0.4 mg  0.4 mg Sublingual Q5 min PRN Harrie Foreman, MD      . ondansetron [facility]) tablet 4 mg  4 mg Oral Q6H PRN Harrie Foreman, MD       Or  . ondansetron [facility]) injection 4 mg  4 mg Intravenous Q6H PRN Harrie Foreman, MD   4 mg at 11/09/15 2047  . oxyCODONE-acetaminophen (PERCOCET/ROXICET) 5-325 MG per tablet 1-2 tablet  1-2 tablet Oral Q6H PRN Markus Daft, MD   1 tablet at 11/07/15 2028  . pantoprazole (PROTONIX) injection 40 mg  40 mg Intravenous Q12H Harrie Foreman, MD   40 mg at 11/14/15 0900  . pravastatin (PRAVACHOL) tablet 20 mg  20 mg Oral q1800 Harrie Foreman, MD   20 mg at 11/13/15 1714  . promethazine (PHENERGAN) injection 12.5 mg  12.5 mg Intravenous Q6H PRN Lance Coon, MD   12.5 mg at 11/05/15 2353  . senna-docusate (Senokot-S) tablet 2 tablet  2 tablet Oral BID Epifanio Lesches, MD   2 tablet at 11/14/15 0900  . sodium chloride 0.9 % injection 3 mL  3 mL Intravenous Q12H Harrie Foreman, MD   3 mL at 11/14/15 1000  . tamsulosin (FLOMAX) capsule 0.4 mg  0.4 mg Oral QPC supper Murlean Iba, MD   0.4 mg at 11/13/15 1713  . tiotropium (SPIRIVA) inhalation capsule 18 mcg  1 capsule Inhalation Daily Harrie Foreman, MD   18 mcg at 11/13/15 1000     Discharge Medications: Please see discharge summary for a list of discharge medications.  Relevant Imaging Results:  Relevant Lab Results:   Additional Information    Alonna Buckler, LCSW

## 2015-11-14 NOTE — Care Management (Signed)
Spoke with the son of the patient  Bradley and spouse again. The son stated to me that he feels family is not able to provide help with the patient at home. He stated that even though his niece and his brother would be staying with the patient his brothers work schedule and his nieces ability to care for his father would not be a good plan. The patient spouse would not be able to assist due to her small stature and physical limitations. He suggested that I call the oldest brother Doug and gave me his number.   I contacted Tara Mitchell CSW covering 2C and she agreed to look at possible placement today. Discussed case with Dr Konidena attending. Tara went to room to discuss with family.  

## 2015-11-14 NOTE — Progress Notes (Signed)
Nutrition Follow-up     INTERVENTION:  Meals and snacks: cater to pt preferences   NUTRITION DIAGNOSIS:   Inadequate oral intake related to altered GI function as evidenced by NPO status, improving as on solid foods   GOAL:   Patient will meet greater than or equal to 90% of their needs  Progressing towards meeting nutritional needs  MONITOR:    (Energy intake, Digestive system)  REASON FOR ASSESSMENT:   NPO/Clear Liquid Diet    ASSESSMENT:      Pt s/p cholecystectomy tube placed   Current Nutrition: tolerating solid foods, reports eating 50% or more of meals.  Per I and O sheet eating 100% of most meals   Gastrointestinal Profile: Last BM: 12/15   Scheduled Medications:  . ampicillin-sulbactam (UNASYN) IV  3 g Intravenous Q6H  . bisacodyl  10 mg Rectal Once  . FLUoxetine  20 mg Oral Daily  . insulin aspart  0-5 Units Subcutaneous QHS  . insulin aspart  0-9 Units Subcutaneous TID WC  . metoprolol tartrate  12.5 mg Oral BID  . pantoprazole (PROTONIX) IV  40 mg Intravenous Q12H  . pravastatin  20 mg Oral q1800  . senna-docusate  2 tablet Oral BID  . sodium chloride  3 mL Intravenous Q12H  . tamsulosin  0.4 mg Oral QPC supper  . tiotropium  1 capsule Inhalation Daily       Electrolyte/Renal Profile and Glucose Profile:   Recent Labs Lab 11/10/15 0536 11/11/15 0509 11/12/15 0929  NA 135 137 140  K 4.0 4.1 4.5  CL 104 108 107  CO2 24 24 26   BUN 69* 56* 33*  CREATININE 3.13* 2.05* 1.25*  CALCIUM 8.7* 9.0 9.4  GLUCOSE 140* 125* 139*   Protein Profile:  Recent Labs Lab 11/08/15 0515 11/08/15 1257  ALBUMIN 2.8* 2.7*        Weight Trend since Admission: Filed Weights   11/12/15 0500 11/13/15 0500 11/14/15 0557  Weight: 254 lb 6.4 oz (115.395 kg) 258 lb (117.028 kg) 253 lb 3.2 oz (114.851 kg)      Diet Order:  Diet Heart Room service appropriate?: Yes; Fluid consistency:: Thin  Skin:   reviewed  Height:   Ht Readings from Last  1 Encounters:  11/05/15 6\' 3"  (1.905 m)    Weight:   Wt Readings from Last 1 Encounters:  11/14/15 253 lb 3.2 oz (114.851 kg)    Ideal Body Weight:     BMI:  Body mass index is 31.65 kg/(m^2).  Estimated Nutritional Needs:   Kcal:  BEE 1875 kcals (IF 1.0-1.2-, AF 1.3) [id number] kcals/d.   Protein:  (1.0-1.3 g/kg) 111-144 g/d  Fluid:  (25-76ml/kg) 2775-3316ml/d  EDUCATION NEEDS:   No education needs identified at this time  [place] Care Level  Justin Leonard B. Zenia Resides, [place], [place] (pager)

## 2015-11-14 NOTE — Clinical Social Work Note (Signed)
Clinical Social Work Assessment  Patient Details  Name: Justin Leonard MRN: [id number] Date of Birth: 1926-12-24  Date of referral:  11/14/15               Reason for consult:  Facility Placement                Permission sought to share information with:  Facility Sport and exercise psychologist, Tourist information centre manager, Family Supports Permission granted to share information::  Yes, Verbal Permission Granted  Name::        Agency::  SNFs  Relationship::  Wife and sons  Contact Information:     Housing/Transportation Living arrangements for the past 2 months:  Single Family Home Source of Information:  Patient, Medical Team, Case Manager, Adult Children, Spouse Patient Interpreter Needed:  None Criminal Activity/Legal Involvement Pertinent to Current Situation/Hospitalization:  No - Comment as needed Significant Relationships:  Adult Children, Spouse Lives with:  Spouse Do you feel safe going back to the place where you live?  No Need for family participation in patient care:  Yes (Comment)  Care giving concerns: Pt lives with his elderly wife of 59 years. Pt requires more assistance than what she is able to give. Family needs Pt to have less skilled needs before coming back home.    Social Worker assessment / plan:  Clinical Social Worker was referred to Pt on day of discharge to assist with short term skilled placement. Pt's family had planned on assisting Pt at home at discharge but as his care progressed it was realized that he would need more skilled care at dc than expected. Pt is married to his wife of 11 years, they have 3 adult sons who live within an hour of them. 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 Pt's family is supportive. CSW discussed SNF with Pt and his family after they shared that Pt had "too much" for them to manage at home and that they are worried about him falling and getting worse due to his  deconditioning. CSW discussed options and benefits. Pt and family in agreement with STR today at dc. CSW updated MD with plan.   Employment status:  Retired Nurse, adult PT Recommendations:  [place] / Referral to community resources:  [place]  Patient/Family's Response to care:  Pt and family are concerned with Pt's needs at [employer]. They are supportive and considerate of Pt's needs.  Patient/Family's Understanding of and Emotional Response to Diagnosis, Current Treatment, and Prognosis:  Pt has follow up in 2 weeks when tubes come out, family is hopeful that Pt can rehab at [facility] while pending further tx decisions.    Emotional Assessment Appearance:  Appears stated age Attitude/Demeanor/Rapport:   (cooperative) Affect (typically observed):  Anxious, Apprehensive, Appropriate, Overwhelmed Orientation:  Oriented to Situation, Oriented to  Time, Oriented to Place, Oriented to Self Alcohol / Substance use:  Tobacco Use Psych involvement (Current and /or in the community):  No (Comment)  Discharge Needs  Concerns to be addressed:  Adjustment to Illness Readmission within the last 30 days:  No Current discharge risk:  Chronically ill, Physical Impairment Barriers to Discharge:  Barriers Resolved   Alonna Buckler, LCSW 11/14/2015, 1:09 PM

## 2015-11-14 NOTE — Clinical Social Work Placement (Signed)
   CLINICAL SOCIAL WORK PLACEMENT  NOTE  Date:  11/14/2015  Patient Details  Name: Justin Leonard MRN: [id number] Date of Birth: May 24, 1927  Clinical Social Work is seeking post-discharge placement for this patient at the [place] level of care (*CSW will initial, date and re-position this form in  chart as items are completed):  Yes   Patient/family provided with [place] Work Department's list of facilities offering this level of care within the geographic area requested by the patient (or if unable, by the patient's family).  Yes   Patient/family informed of their freedom to choose among providers that offer the needed level of care, that participate in Medicare, Medicaid or managed care program needed by the patient, have an available bed and are willing to accept the patient.  Yes   Patient/family informed of [place]'s ownership interest in [facility], as well as of the fact that they are under no obligation to receive care at these facilities.  PASRR submitted to EDS on 11/14/15     PASRR number received on 11/14/15     Existing PASRR number confirmed on       FL2 transmitted to all facilities in geographic area requested by pt/family on 11/14/15     FL2 transmitted to all facilities within larger geographic area on       Patient informed that his/her managed care company has contracts with or will negotiate with certain facilities, including the following:        Yes   Patient/family informed of bed offers received.  Patient chooses bed at [facility]     Physician recommends and patient chooses bed at      Patient to be transferred to [place] on 11/14/15.  Patient to be transferred to facility by family     Patient family notified on 11/14/15 of transfer.  Name of family member notified:  wife and son [name]     PHYSICIAN       Additional Comment:     _______________________________________________ Alonna Buckler, LCSW 11/14/2015, 12:40 PM

## 2015-11-21 ENCOUNTER — Other Ambulatory Visit: Payer: Self-pay

## 2015-11-25 ENCOUNTER — Encounter (INDEPENDENT_AMBULATORY_CARE_PROVIDER_SITE_OTHER): Payer: Self-pay

## 2015-11-25 ENCOUNTER — Ambulatory Visit (INDEPENDENT_AMBULATORY_CARE_PROVIDER_SITE_OTHER): Payer: Medicare Other | Admitting: Surgery

## 2015-11-25 ENCOUNTER — Encounter: Payer: Self-pay | Admitting: Surgery

## 2015-11-25 VITALS — BP 160/69 | HR 62 | Temp 98.0°F | Ht 75.0 in | Wt 240.0 lb

## 2015-11-25 DIAGNOSIS — K8000 Calculus of gallbladder with acute cholecystitis without obstruction: Secondary | ICD-10-CM

## 2015-11-25 NOTE — Patient Instructions (Signed)
Please call us in 2-3 weeks and let us know how things are going. We will be looking into the Cardiology and PCP's notes/recommendations and if everyone is in agreement, we will bring you back into the office to discuss taking your gallbladder out. Call [phone] and ask to speak with Luetta Nutting, my nurse.  I will send my note from today over to [facility] via fax as soon as this is complete.

## 2015-11-25 NOTE — Progress Notes (Signed)
Outpatient Surgical Follow Up  11/25/2015  Justin Leonard is an 81 y.o. male.   CC: acute cholecystitis  HPI:  Patient was met in the hospital where a diagnosis of acute cholecystitis was confirmed and   placement of a cholecystostomy tube  Was performed by interventional radiology.  He had a complicated course coming in with sepsis confusion and cardiac disease.  patient is at a rehabilitation facility and is currently accompanied by son and wife  Foley catheter in place and a cholecystostomy tube and is on supplemental oxygen transported in a wheelchair.    Past Medical History  Diagnosis Date  . CHF (congestive heart failure) ([place])   . Hypertension   . Diabetes mellitus without complication ([place])   . Coronary artery disease   . Hyperlipidemia   . Deafness   . Peripheral neuropathy ([place])   . Back pain   . Umbilical hernia   . Depression   . Melena   . COPD (chronic obstructive pulmonary disease) ([place])   . Skin cancer     Past Surgical History  Procedure Laterality Date  . Coronary artery bypass graft    . Cardiac catheterization Left 07/17/2015    Procedure: Right/Left Heart Cath and Coronary Angiography;  Surgeon: [name], MD;  Location: [place] CV LAB;  Service: Cardiovascular;  Laterality: Left;  . Cardiac catheterization N/A 07/17/2015    Procedure: Coronary Stent Intervention;  Surgeon: [name], MD;  Location: [place] CV LAB;  Service: Cardiovascular;  Laterality: N/A;  . Coronary angioplasty    . Esophagogastroduodenoscopy N/A 11/10/2015    Procedure: ESOPHAGOGASTRODUODENOSCOPY (EGD);  Surgeon: [name], MD;  Location: [facility] ENDOSCOPY;  Service: Endoscopy;  Laterality: N/A;    Family History  Problem Relation Age of Onset  . Anemia Neg Hx   . Arrhythmia Neg Hx   . Asthma Neg Hx   . Clotting disorder Neg Hx   . Fainting Neg Hx   . Heart attack Neg Hx   . Heart disease Neg Hx   . Heart failure Neg Hx   . Hyperlipidemia Neg Hx    . Hypertension Father   . CVA Father     Social History:  reports that he quit smoking about 37 years ago. His smoking use included Cigarettes. He has a 90 pack-year smoking history. He has never used smokeless tobacco. He reports that he does not drink alcohol or use illicit drugs.  Allergies: No Known Allergies  Medications reviewed.   Review of Systems:   Review of Systems  Constitutional: Negative for fever, chills and weight loss.  HENT: Negative.   Eyes: Negative.   Respiratory: Negative.   Cardiovascular: Negative.   Gastrointestinal: Negative.   Genitourinary: Negative.   Musculoskeletal: Negative.   Skin: Negative.   Neurological: Negative.   Endo/Heme/Allergies: Negative.   Psychiatric/Behavioral: Negative.      Physical Exam:  There were no vitals taken for this visit.  Physical Exam  Constitutional: He is oriented to person, place, and time and well-developed, well-nourished, and in no distress. No distress.  HENT:  Head: Normocephalic and atraumatic.  Eyes: Pupils are equal, round, and reactive to light. Right eye exhibits no discharge. Left eye exhibits no discharge. No scleral icterus.  Neck: Normal range of motion. Neck supple.  Cardiovascular: Normal rate and regular rhythm.   Pulmonary/Chest: Effort normal and breath sounds normal. No respiratory distress.  Abdominal: Soft. He exhibits no distension. There is no tenderness. There is no rebound  and no guarding.  Bilious drainage from right upper quadrant cholecystostomy tube  Genitourinary: Penis normal.  Foley catheter in place  Musculoskeletal: He exhibits edema.  Massive pitting edema of both lower extremities  Lymphadenopathy:    He has no cervical adenopathy.  Neurological: He is alert and oriented to person, place, and time.  Hard of hearing  Skin: Skin is warm and dry. He is not diaphoretic. No erythema.  Vitals reviewed.     No results found for this or any previous visit (from the  past 48 hour(s)). No results found.  Assessment/Plan:   status post cholecystostomy tube placement for acute cholecystitis in the face of sepsis and multiple medical problems. At this point the patient appears very frail he is on supplemental oxygen in a wheelchair with a functional cholecystostomy tube and a Foley catheter in place. He is not able to get around on his own and can barely stand. Would not recommend removing the cholecystostomy tube at this point nor would I recommend scheduling laparoscopic cholecystectomy for him at this point either his other medical conditions need to be optimized. Additionally he is less than 6 months out from stent placement and is no longer on an a and antiplatelet drug as it was stopped several weeks ago for GI bleed. He has appointments with a GI physician as well as primary care physician and cardiologist in the near future. Would recommend holding off on any decisions about the cholecystostomy tube until it is determined whether or not he can have a general anesthetic which I think the risks may outweigh the benefits and that's removal of the cholecystostomy tube alone may be sufficient in this elderly frail patient. Almost 40 minutes are spent in direct patient care with this patient and discussing counseling family members.   Florene Glen, MD, FACS

## 2015-11-26 ENCOUNTER — Encounter: Payer: Self-pay | Admitting: Medical Oncology

## 2015-11-26 ENCOUNTER — Emergency Department
Admission: EM | Admit: 2015-11-26 | Discharge: 2015-11-26 | Discharge status: Against Medical Advice | Payer: Medicare Other | Attending: Emergency Medicine | Admitting: Emergency Medicine

## 2015-11-26 DIAGNOSIS — E119 Type 2 diabetes mellitus without complications: Secondary | ICD-10-CM | POA: Diagnosis not present

## 2015-11-26 DIAGNOSIS — Z87891 Personal history of nicotine dependence: Secondary | ICD-10-CM | POA: Diagnosis not present

## 2015-11-26 DIAGNOSIS — I10 Essential (primary) hypertension: Secondary | ICD-10-CM | POA: Insufficient documentation

## 2015-11-26 DIAGNOSIS — K922 Gastrointestinal hemorrhage, unspecified: Secondary | ICD-10-CM | POA: Diagnosis present

## 2015-11-26 NOTE — ED Notes (Signed)
Pts son was under impression that pt was going to be outpatient and get blood transfusion and be d/c'd home. Patient and family wish to wait and not pay ER bill when they can call PCP in the am for same day. Pt advised that we would be happy to see them and possibly do transfusion but pt does not want to stay.

## 2015-11-26 NOTE — ED Notes (Signed)
Pt sent here by pcp and GI MD, pt was discharged from hospital recently for [place] bladder issues which a drain has been placed in for bc pt is not surgical candidate. Pt reports he went to his GI doc today and hemocult tested positive and then blood work was done this am and HGB came back at 7.0. Pt reports occasional sob.

## 2015-11-27 ENCOUNTER — Ambulatory Visit: Payer: Medicare Other | Admitting: Obstetrics and Gynecology

## 2015-11-27 ENCOUNTER — Other Ambulatory Visit: Payer: Self-pay

## 2015-11-27 ENCOUNTER — Emergency Department: Payer: Medicare Other

## 2015-11-27 ENCOUNTER — Emergency Department
Admission: EM | Admit: 2015-11-27 | Discharge: 2015-11-27 | Disposition: A | Discharge status: Home / Self Care | Payer: Medicare Other | Attending: Emergency Medicine | Admitting: Emergency Medicine

## 2015-11-27 DIAGNOSIS — E119 Type 2 diabetes mellitus without complications: Secondary | ICD-10-CM | POA: Insufficient documentation

## 2015-11-27 DIAGNOSIS — R0602 Shortness of breath: Secondary | ICD-10-CM | POA: Diagnosis present

## 2015-11-27 DIAGNOSIS — Z794 Long term (current) use of insulin: Secondary | ICD-10-CM | POA: Insufficient documentation

## 2015-11-27 DIAGNOSIS — D649 Anemia, unspecified: Secondary | ICD-10-CM | POA: Insufficient documentation

## 2015-11-27 DIAGNOSIS — K922 Gastrointestinal hemorrhage, unspecified: Secondary | ICD-10-CM | POA: Insufficient documentation

## 2015-11-27 DIAGNOSIS — Z7984 Long term (current) use of oral hypoglycemic drugs: Secondary | ICD-10-CM | POA: Insufficient documentation

## 2015-11-27 DIAGNOSIS — J441 Chronic obstructive pulmonary disease with (acute) exacerbation: Secondary | ICD-10-CM | POA: Diagnosis not present

## 2015-11-27 DIAGNOSIS — Z792 Long term (current) use of antibiotics: Secondary | ICD-10-CM | POA: Diagnosis not present

## 2015-11-27 DIAGNOSIS — R600 Localized edema: Secondary | ICD-10-CM | POA: Insufficient documentation

## 2015-11-27 DIAGNOSIS — Z87891 Personal history of nicotine dependence: Secondary | ICD-10-CM | POA: Diagnosis not present

## 2015-11-27 DIAGNOSIS — Z79899 Other long term (current) drug therapy: Secondary | ICD-10-CM | POA: Insufficient documentation

## 2015-11-27 DIAGNOSIS — Z7982 Long term (current) use of aspirin: Secondary | ICD-10-CM | POA: Insufficient documentation

## 2015-11-27 DIAGNOSIS — I10 Essential (primary) hypertension: Secondary | ICD-10-CM | POA: Diagnosis not present

## 2015-11-27 DIAGNOSIS — R195 Other fecal abnormalities: Secondary | ICD-10-CM

## 2015-11-27 DIAGNOSIS — R609 Edema, unspecified: Secondary | ICD-10-CM

## 2015-11-27 LAB — COMPREHENSIVE METABOLIC PANEL
ALBUMIN: 3.2 g/dL — AB (ref 3.5–5.0)
ALK PHOS: 83 U/L (ref 38–126)
ALT: 12 U/L — ABNORMAL LOW (ref 17–63)
ANION GAP: 4 — AB (ref 5–15)
AST: 13 U/L — ABNORMAL LOW (ref 15–41)
BILIRUBIN TOTAL: 0.5 mg/dL (ref 0.3–1.2)
BUN: 12 mg/dL (ref 6–20)
CALCIUM: 9.5 mg/dL (ref 8.9–10.3)
CO2: 31 mmol/L (ref 22–32)
Chloride: 102 mmol/L (ref 101–111)
Creatinine, Ser: 0.88 mg/dL (ref 0.61–1.24)
GFR calc Af Amer: >60 mL/min (ref >60)
GLUCOSE: 198 mg/dL — AB (ref 65–99)
POTASSIUM: 4.2 mmol/L (ref 3.5–5.1)
Sodium: 137 mmol/L (ref 135–145)
TOTAL PROTEIN: 6.5 g/dL (ref 6.5–8.1)

## 2015-11-27 LAB — CBC WITH DIFFERENTIAL/PLATELET
BASOS PCT: 1 %
Basophils Absolute: 0.1 10*3/uL (ref 0–0.1)
EOS ABS: 0.3 10*3/uL (ref 0–0.7)
Eosinophils Relative: 5 %
HCT: 24.3 % — ABNORMAL LOW (ref 40.0–52.0)
HEMOGLOBIN: 7.7 g/dL — AB (ref 13.0–18.0)
LYMPHS ABS: 0.7 10*3/uL — AB (ref 1.0–3.6)
Lymphocytes Relative: 11 %
MCH: 25.5 pg — ABNORMAL LOW (ref 26.0–34.0)
MCHC: 31.6 g/dL — AB (ref 32.0–36.0)
MCV: 80.8 fL (ref 80.0–100.0)
Monocytes Absolute: 0.4 10*3/uL (ref 0.2–1.0)
Monocytes Relative: 6 %
NEUTROS PCT: 77 %
Neutro Abs: 5 10*3/uL (ref 1.4–6.5)
Platelets: 242 10*3/uL (ref 150–440)
RBC: 3.01 MIL/uL — AB (ref 4.40–5.90)
RDW: 18.5 % — AB (ref 11.5–14.5)
WBC: 6.4 10*3/uL (ref 3.8–10.6)

## 2015-11-27 LAB — PROTIME-INR
INR: 1.13
PROTHROMBIN TIME: 14.7 s (ref 11.4–15.0)

## 2015-11-27 LAB — LIPASE, BLOOD: LIPASE: 44 U/L (ref 11–51)

## 2015-11-27 MED ORDER — FUROSEMIDE 10 MG/ML IJ SOLN
40.0000 mg | Freq: Once | INTRAMUSCULAR | Status: AC
  Administered 2015-11-27: 40 mg via INTRAVENOUS
  Filled 2015-11-27: qty 4

## 2015-11-27 MED ORDER — SODIUM CHLORIDE 0.9 % IV BOLUS (SEPSIS): 500.0000 mL | Freq: Once | INTRAVENOUS | Status: DC

## 2015-11-27 MED ORDER — FUROSEMIDE 40 MG PO TABS
40.0000 mg | ORAL_TABLET | Freq: Every day | ORAL | Status: DC
Start: 2015-11-27 — End: 2019-07-10

## 2015-11-27 NOTE — Discharge Instructions (Signed)
Anemia, Nonspecific Anemia is a condition in which the concentration of red blood cells or hemoglobin in the blood is below normal. Hemoglobin is a substance in red blood cells that carries oxygen to the tissues of the body. Anemia results in not enough oxygen reaching these tissues.  CAUSES  Common causes of anemia include:   Excessive bleeding. Bleeding may be internal or external. This includes excessive bleeding from periods (in women) or from the intestine.   Poor nutrition.   Chronic kidney, thyroid, and liver disease.  Bone marrow disorders that decrease red blood cell production.  Cancer and treatments for cancer.  HIV, AIDS, and their treatments.  Spleen problems that increase red blood cell destruction.  Blood disorders.  Excess destruction of red blood cells due to infection, medicines, and autoimmune disorders. SIGNS AND SYMPTOMS   Minor weakness.   Dizziness.   Headache.  Palpitations.   Shortness of breath, especially with exercise.   Paleness.  Cold sensitivity.  Indigestion.  Nausea.  Difficulty sleeping.  Difficulty concentrating. Symptoms may occur suddenly or they may develop slowly.  DIAGNOSIS  Additional blood tests are often needed. These help your health care provider determine the best treatment. Your health care provider will check your stool for blood and look for other causes of blood loss.  TREATMENT  Treatment varies depending on the cause of the anemia. Treatment can include:   Supplements of iron, vitamin 123456, or folic acid.   Hormone medicines.   A blood transfusion. This may be needed if blood loss is severe.   Hospitalization. This may be needed if there is significant continual blood loss.   Dietary changes.  Spleen removal. HOME CARE INSTRUCTIONS Keep all follow-up appointments. It often takes many weeks to correct anemia, and having your health care provider check on your condition and your response to  treatment is very important. SEEK IMMEDIATE MEDICAL CARE IF:   You develop extreme weakness, shortness of breath, or chest pain.   You become dizzy or have trouble concentrating.  You develop heavy vaginal bleeding.   You develop a rash.   You have bloody or black, tarry stools.   You faint.   You vomit up blood.   You vomit repeatedly.   You have abdominal pain.  You have a fever or persistent symptoms for more than 2-3 days.   You have a fever and your symptoms suddenly get worse.   You are dehydrated.  MAKE SURE YOU:  Understand these instructions.  Will watch your condition.  Will get help right away if you are not doing well or get worse.   This information is not intended to replace advice given to you by your health care provider. Make sure you discuss any questions you have with your health care provider.   Document Released: 12/23/2004 Document Revised: 07/18/2013 Document Reviewed: 05/11/2013 Elsevier Interactive Patient Education 2016 Elsevier Inc.  Gastrointestinal Bleeding Gastrointestinal (GI) bleeding means there is bleeding somewhere along the digestive tract, between the mouth and anus. CAUSES  There are many different problems that can cause GI bleeding. Possible causes include:  Esophagitis. This is inflammation, irritation, or swelling of the esophagus.  Hemorrhoids.These are veins that are full of blood (engorged) in the rectum. They cause pain, inflammation, and may bleed.  Anal fissures.These are areas of painful tearing which may bleed. They are often caused by passing hard stool.  Diverticulosis.These are pouches that form on the colon over time, with age, and may bleed significantly.  Diverticulitis.This  is inflammation in areas with diverticulosis. It can cause pain, fever, and bloody stools, although bleeding is rare.  Polyps and cancer. Colon cancer often starts out as precancerous polyps.  Gastritis and  ulcers.Bleeding from the upper gastrointestinal tract (near the stomach) may travel through the intestines and produce black, sometimes tarry, often bad smelling stools. In certain cases, if the bleeding is fast enough, the stools may not be black, but red. This condition may be life-threatening. SYMPTOMS   Vomiting bright red blood or material that looks like coffee grounds.  Bloody, black, or tarry stools. DIAGNOSIS  Your caregiver may diagnose your condition by taking your history and performing a physical exam. More tests may be needed, including:  X-rays and other imaging tests.  Esophagogastroduodenoscopy (EGD). This test uses a flexible, lighted tube to look at your esophagus, stomach, and small intestine.  Colonoscopy. This test uses a flexible, lighted tube to look at your colon. TREATMENT  Treatment depends on the cause of your bleeding.   For bleeding from the esophagus, stomach, small intestine, or colon, the caregiver doing your EGD or colonoscopy may be able to stop the bleeding as part of the procedure.  Inflammation or infection of the colon can be treated with medicines.  Many rectal problems can be treated with creams, suppositories, or warm baths.  Surgery is sometimes needed.  Blood transfusions are sometimes needed if you have lost a lot of blood. If bleeding is slow, you may be allowed to go home. If there is a lot of bleeding, you will need to stay in the hospital for observation. HOME CARE INSTRUCTIONS   Take any medicines exactly as prescribed.  Keep your stools soft by eating foods that are high in fiber. These foods include whole grains, legumes, fruits, and vegetables. Prunes (1 to 3 a day) work well for many people.  Drink enough fluids to keep your urine clear or pale yellow. SEEK IMMEDIATE MEDICAL CARE IF:   Your bleeding increases.  You feel lightheaded, weak, or you faint.  You have severe cramps in your back or abdomen.  You pass large  blood clots in your stool.  Your problems are getting worse. MAKE SURE YOU:   Understand these instructions.  Will watch your condition.  Will get help right away if you are not doing well or get worse.   This information is not intended to replace advice given to you by your health care provider. Make sure you discuss any questions you have with your health care provider.   Document Released: 11/12/2000 Document Revised: 11/01/2012 Document Reviewed: 05/05/2015 Elsevier Interactive Patient Education 2016 Elsevier Inc.  Peripheral Edema You have swelling in your legs (peripheral edema). This swelling is due to excess accumulation of salt and water in your body. Edema may be a sign of heart, kidney or liver disease, or a side effect of a medication. It may also be due to problems in the leg veins. Elevating your legs and using special support stockings may be very helpful, if the cause of the swelling is due to poor venous circulation. Avoid long periods of standing, whatever the cause. Treatment of edema depends on identifying the cause. Chips, pretzels, pickles and other salty foods should be avoided. Restricting salt in your diet is almost always needed. Water pills (diuretics) are often used to remove the excess salt and water from your body via urine. These medicines prevent the kidney from reabsorbing sodium. This increases urine flow. Diuretic treatment may also result in  lowering of potassium levels in your body. Potassium supplements may be needed if you have to use diuretics daily. Daily weights can help you keep track of your progress in clearing your edema. You should call your caregiver for follow up care as recommended. SEEK IMMEDIATE MEDICAL CARE IF:   You have increased swelling, pain, redness, or heat in your legs.  You develop shortness of breath, especially when lying down.  You develop chest or abdominal pain, weakness, or fainting.  You have a fever.   This  information is not intended to replace advice given to you by your health care provider. Make sure you discuss any questions you have with your health care provider.   Document Released: 12/23/2004 Document Revised: 02/07/2012 Document Reviewed: 05/28/2015 Elsevier Interactive Patient Education [employer].

## 2015-11-27 NOTE — ED Provider Notes (Signed)
[facility] Emergency Department Provider Note  ____________________________________________  Time seen: 9:10 AM  I have reviewed the triage vital signs and the nursing notes.   HISTORY  Chief Complaint GI Bleeding  HPI Justin Leonard is a 81 y.o. male brought into the ED by his family for a decrease in his hemoglobin level from 8-7 over the past week as well as increased peripheral edema despite Lasix use and some mild shortness of breath. The patient himself reports that he feels fine. Has been seen by primary care and GI recently and was told to come to the ER for a transfusion. He is being followed for known GI bleeding but has not been able to undergo a colonoscopy yet due to his COPD and having a percutaneous drain in his gallbladder from cholecystitis for which she was unable to undergo surgery.  No fever or vomiting or chest pain. No dizziness or syncope. Has been compliant with physical therapy at skilled nursing facility where he currently resides.     Past Medical History  Diagnosis Date  . CHF (congestive heart failure) ([place])   . Hypertension   . Diabetes mellitus without complication ([place])   . Coronary artery disease   . Hyperlipidemia   . Deafness   . Peripheral neuropathy ([place])   . Back pain   . Umbilical hernia   . Depression   . Melena   . COPD (chronic obstructive pulmonary disease) ([place])   . Skin cancer      Patient Active Problem List   Diagnosis Date Noted  . Bleeding gastrointestinal   . Acute cholecystitis   . GI bleed 11/05/2015  . Diabetic hypoglycemia ([place]) 10/13/2015  . Bradycardia 07/30/2015  . COPD exacerbation ([place]) 07/15/2015  . Acute respiratory failure with hypoxia ([place]) 07/15/2015  . Acute on chronic diastolic CHF (congestive heart failure) ([place]) 07/15/2015  . Elevated troponin 07/15/2015  . Chronic diastolic heart failure ([place]) 04/17/2015  . HTN (hypertension) 04/17/2015  . Diabetes ([place]) 04/17/2015  .  COPD (chronic obstructive pulmonary disease) ([place]) 04/17/2015  . Clinical depression 08/23/2012  . Melanoma in situ ([place]) 08/23/2012  . Exomphalos 08/23/2012  . Difficulty hearing 01/18/2012  . HLD (hyperlipidemia) 01/18/2012  . Peripheral nerve disease ([place]) 01/18/2012     Past Surgical History  Procedure Laterality Date  . Coronary artery bypass graft    . Cardiac catheterization Left 07/17/2015    Procedure: Right/Left Heart Cath and Coronary Angiography;  Surgeon: [name], MD;  Location: [facility];  Service: Cardiovascular;  Laterality: Left;  . Cardiac catheterization N/A 07/17/2015    Procedure: Coronary Stent Intervention;  Surgeon: [name], MD;  Location: [facility];  Service: Cardiovascular;  Laterality: N/A;  . Coronary angioplasty    . Esophagogastroduodenoscopy N/A 11/10/2015    Procedure: ESOPHAGOGASTRODUODENOSCOPY (EGD);  Surgeon: [name], MD;  Location: [facility] ENDOSCOPY;  Service: Endoscopy;  Laterality: N/A;     Current Outpatient Rx  Name  Route  Sig  Dispense  Refill  . acetaminophen (TYLENOL) 325 MG tablet   Oral   Take 650 mg by mouth every 6 (six) hours as needed.         Marland Kitchen amoxicillin-clavulanate (AUGMENTIN) 875-125 MG tablet   Oral   Take 1 tablet by mouth 2 (two) times daily.   16 tablet   0   . aspirin EC 81 MG tablet   Oral   Take 81 mg by mouth daily.         Marland Kitchen  bisacodyl (DULCOLAX) 10 MG suppository   Rectal   Place 1 suppository (10 mg total) rectally once.   12 suppository   0   . FLUoxetine (PROZAC) 20 MG capsule   Oral   Take 20 mg by mouth daily.         Marland Kitchen glyBURIDE (DIABETA) 5 MG tablet   Oral   Take 5 mg by mouth daily with breakfast.         . HYDROcodone-acetaminophen (NORCO/VICODIN) 5-325 MG tablet   Oral   Take 1 tablet by mouth every 6 (six) hours as needed for moderate pain.         Marland Kitchen insulin aspart (NOVOLOG) 100 UNIT/ML injection   Subcutaneous   Inject 0-9 Units into the  skin 3 (three) times daily with meals.   10 mL   11   . lisinopril (PRINIVIL,ZESTRIL) 20 MG tablet   Oral   Take 1 tablet by mouth daily.         Marland Kitchen lovastatin (MEVACOR) 20 MG tablet   Oral   Take 20 mg by mouth at bedtime.         . metFORMIN (GLUCOPHAGE) 500 MG tablet   Oral   Take 500 mg by mouth 3 (three) times daily.         . metoprolol succinate (TOPROL XL) 25 MG 24 hr tablet   Oral   Take 1 tablet (25 mg total) by mouth 2 (two) times daily.   60 tablet   0   . nitroGLYCERIN (NITROSTAT) 0.4 MG SL tablet   Sublingual   Place 1 tablet under the tongue every 5 (five) minutes as needed.         Marland Kitchen omeprazole (PRILOSEC) 20 MG capsule   Oral   Take 20 mg by mouth daily.         Marland Kitchen oxyCODONE-acetaminophen (PERCOCET/ROXICET) 5-325 MG tablet   Oral   Take 1-2 tablets by mouth every 6 (six) hours as needed for moderate pain.   30 tablet   0   . pantoprazole (PROTONIX) 40 MG tablet   Oral   Take 1 tablet by mouth daily.         Marland Kitchen senna-docusate (SENOKOT-S) 8.6-50 MG tablet   Oral   Take 2 tablets by mouth 2 (two) times daily.   30 tablet   0   . tamsulosin (FLOMAX) 0.4 MG CAPS capsule   Oral   Take 1 capsule (0.4 mg total) by mouth daily after supper.   30 capsule   0   . ticagrelor (BRILINTA) 90 MG TABS tablet   Oral   Take 1 tablet by mouth daily.         . Tiotropium Bromide Monohydrate (SPIRIVA RESPIMAT) 2.5 MCG/ACT AERS   Inhalation   Inhale 2 puffs into the lungs daily.         . traZODone (DESYREL) 50 MG tablet   Oral   Take 25 mg by mouth at bedtime as needed for sleep.         . VENTOLIN HFA 108 (90 BASE) MCG/ACT inhaler   Inhalation   Inhale 1-2 puffs into the lungs as needed.           Dispense as written.   . furosemide (LASIX) 40 MG tablet   Oral   Take 1 tablet (40 mg total) by mouth daily.   10 tablet   0      Allergies Review of patient's allergies indicates no known allergies.  Family History  Problem  Relation Age of Onset  . Anemia Neg Hx   . Arrhythmia Neg Hx   . Asthma Neg Hx   . Clotting disorder Neg Hx   . Fainting Neg Hx   . Heart attack Neg Hx   . Heart disease Neg Hx   . Heart failure Neg Hx   . Hyperlipidemia Neg Hx   . Hypertension Father   . CVA Father     Social History Social History  Substance Use Topics  . Smoking status: Former Smoker -- 3.00 packs/day for 30 years    Types: Cigarettes    Quit date: 04/16/1978  . Smokeless tobacco: Never Used     Comment: Quit smoking 1979  . Alcohol Use: No    Review of Systems  Constitutional:   No fever or chills. No weight changes Eyes:   No blurry vision or double vision.  ENT:   No sore throat. Cardiovascular:   No chest pain. Respiratory:   Mild shortness of breath, no cough Gastrointestinal:   Negative for abdominal pain, vomiting and diarrhea.  No BRBPR or melena. Genitourinary:   Negative for dysuria, urinary retention, bloody urine, or difficulty urinating. Musculoskeletal:   Negative for back pain. Worsening bilateral lower extremity edema  Skin:   Negative for rash. Neurological:   Negative for headaches, focal weakness or numbness. Psychiatric:  No anxiety or depression.   Endocrine:  No hot/cold intolerance, changes in energy, or sleep difficulty.  10-point ROS otherwise negative.  ____________________________________________   PHYSICAL EXAM:  VITAL SIGNS: ED Triage Vitals  Enc Vitals Group     BP 11/27/15 0906 142/67 mmHg     Pulse Rate 11/27/15 0906 62     Resp 11/27/15 0906 22     Temp 11/27/15 0906 97.7 F (36.5 C)     Temp Source 11/27/15 0906 Oral     SpO2 11/27/15 0906 99 %     Weight 11/27/15 0906 240 lb (108.863 kg)     Height 11/27/15 0906 6\' 2"  (1.88 m)     Head Cir --      Peak Flow --      Pain Score 11/27/15 0907 0     Pain Loc --      Pain Edu? --      Excl. in [place]? --     Vital signs reviewed, nursing assessments reviewed.   Constitutional:   Alert and oriented.  Well appearing and in no distress. Eyes:   No scleral icterus. No conjunctival pallor. PERRL. EOMI ENT   Head:   Normocephalic and atraumatic.   Nose:   No congestion/rhinnorhea. No septal hematoma   Mouth/Throat:   MMM, no pharyngeal erythema. No peritonsillar mass. No uvula shift.   Neck:   No stridor. No SubQ emphysema. No meningismus. Hematological/Lymphatic/Immunilogical:   No cervical lymphadenopathy. Cardiovascular:   RRR. Normal and symmetric distal pulses are present in all extremities. No murmurs, rubs, or gallops. Respiratory:   Normal respiratory effort without tachypnea nor retractions. Breath sounds are clear and equal bilaterally. No wheezes/rales/rhonchi. Gastrointestinal:   Soft and nontender. No distention. There is no CVA tenderness.  No rebound, rigidity, or guarding. Rectal exam reveals green stool, Hemoccult negative. Not melanotic, no gross blood. Genitourinary:   Foley catheter in position Musculoskeletal:   Nontender with normal range of motion in all extremities. No joint effusions.  No lower extremity tenderness. 2+ daily pitting edema bilateral lower extremities. Neurologic:   Normal speech and language.  CN 2-10 normal. Motor grossly intact. No pronator drift.  Normal gait. No gross focal neurologic deficits are appreciated.  Skin:    Skin is warm, dry and intact. No rash noted.  No petechiae, purpura, or bullae. Psychiatric:   Mood and affect are normal. Speech and behavior are normal. Patient exhibits appropriate insight and judgment.  ____________________________________________    LABS (pertinent positives/negatives) (all labs ordered are listed, but only abnormal results are displayed) Labs Reviewed  CBC WITH DIFFERENTIAL/PLATELET - Abnormal; Notable for the following:    RBC 3.01 (*)    Hemoglobin 7.7 (*)    HCT 24.3 (*)    MCH 25.5 (*)    MCHC 31.6 (*)    RDW 18.5 (*)    Lymphs Abs 0.7 (*)    All other components within normal limits   COMPREHENSIVE METABOLIC PANEL - Abnormal; Notable for the following:    Glucose, Bld 198 (*)    Albumin 3.2 (*)    AST 13 (*)    ALT 12 (*)    Anion gap 4 (*)    All other components within normal limits  PROTIME-INR  LIPASE, BLOOD  TYPE AND SCREEN  ABO/RH   ____________________________________________   EKG  Interpreted by me Sinus rhythm rate of 65, normal axis, right bundle branch block. Normal ST segments and T waves.  ____________________________________________    RADIOLOGY  Chest x-ray unremarkable except for possible mild edema, stable from previous  ____________________________________________   PROCEDURES   ____________________________________________   INITIAL IMPRESSION / ASSESSMENT AND PLAN / ED COURSE  Pertinent labs & imaging results that were available during my care of the patient were reviewed by me and considered in my medical decision making (see chart for details).  Patient presents with concern for acute worsened anemia as well as symptoms of heart failure. However, patient lies supine without difficulty, no JVD. X-ray on concerning. Hemoglobin is actually stable compared to previous, 7.7 today. This is within lab margin of error regarding the results that had last week.  At this time it does not look like the patient is having any acute worsening of his bleeding. He is not unstable and has unremarkable vital signs. Under a restrictive strategy, the patient would overall not benefit from a blood transfusion at this time. He is having good urine output in the Foley catheter here in the ED, and I gave him IV Lasix to continue diuresis and. Renal function is within normal limits at this time. No evidence of pneumonia or sepsis or other acute issues. Have him continue to follow closely with primary care surgery and GI for his many medical issues, but he does not appear to warrant hospitalization at this time. I will increase his Lasix dosing to 40 mg  daily to improve his diuresis.     ____________________________________________   FINAL CLINICAL IMPRESSION(S) / ED DIAGNOSES  Final diagnoses:  Occult GI bleeding  Chronic anemia  Peripheral edema      Carrie Mew, MD 11/27/15 1152

## 2015-11-27 NOTE — ED Notes (Signed)
Pt sent by PCP for blood transfusion, states hgb 7 yesterday.. States had to have blood transfusion 12/8, from GI bleed

## 2015-11-30 LAB — TYPE AND SCREEN
ABO/RH(D): O POS
Antibody Screen: POSITIVE
Unit division: 0

## 2015-12-01 ENCOUNTER — Other Ambulatory Visit
Admission: RE | Admit: 2015-12-01 | Discharge: 2015-12-01 | Disposition: A | Discharge status: Home / Self Care | Payer: Medicare Other | Source: Ambulatory Visit | Attending: Family Medicine | Admitting: Family Medicine

## 2015-12-01 DIAGNOSIS — D500 Iron deficiency anemia secondary to blood loss (chronic): Secondary | ICD-10-CM | POA: Insufficient documentation

## 2015-12-01 LAB — CBC WITH DIFFERENTIAL/PLATELET
Basophils Absolute: 0 10*3/uL (ref 0–0.1)
EOS ABS: 0.3 10*3/uL (ref 0–0.7)
Eosinophils Relative: 6 %
HEMATOCRIT: 26.5 % — AB (ref 40.0–52.0)
HEMOGLOBIN: 8 g/dL — AB (ref 13.0–18.0)
LYMPHS ABS: 0.9 10*3/uL — AB (ref 1.0–3.6)
Lymphocytes Relative: 16 %
MCH: 25.8 pg — AB (ref 26.0–34.0)
MCHC: 30.2 g/dL — ABNORMAL LOW (ref 32.0–36.0)
MCV: 85.4 fL (ref 80.0–100.0)
Monocytes Absolute: 0.4 10*3/uL (ref 0.2–1.0)
Monocytes Relative: 7 %
NEUTROS ABS: 3.9 10*3/uL (ref 1.4–6.5)
Platelets: 198 10*3/uL (ref 150–440)
RBC: 3.1 MIL/uL — AB (ref 4.40–5.90)
RDW: 19.6 % — ABNORMAL HIGH (ref 11.5–14.5)
WBC: 5.5 10*3/uL (ref 3.8–10.6)

## 2015-12-02 ENCOUNTER — Encounter: Payer: Self-pay | Admitting: Obstetrics and Gynecology

## 2015-12-02 ENCOUNTER — Ambulatory Visit (INDEPENDENT_AMBULATORY_CARE_PROVIDER_SITE_OTHER): Payer: Medicare Other | Admitting: Obstetrics and Gynecology

## 2015-12-02 VITALS — BP 148/63 | HR 59 | Resp 16

## 2015-12-02 DIAGNOSIS — N400 Enlarged prostate without lower urinary tract symptoms: Secondary | ICD-10-CM

## 2015-12-02 DIAGNOSIS — R339 Retention of urine, unspecified: Secondary | ICD-10-CM

## 2015-12-02 NOTE — Patient Instructions (Signed)
Clean Intermittent Catheterization (CIC)  If you have difficulty urinating after Foley catheter removal tomorrow please start performing CIC 4 times daily if you are unable to urinate on your own at all.  He will be provided catheter samples to use.  If you're able to urinate some but still feel like you're unable to empty her bladder I would like you to perform CIC at least once in the morning and once prior to going to bed at night. He can also perform CIC as needed if you feel that you're bladder is full.  I will have the return in 2 weeks for a recheck and for [place] to scan her bladder and make sure that you are emptying well.

## 2015-12-02 NOTE — Progress Notes (Signed)
12/02/2015 12:39 PM   Justin Leonard 1927/04/11 [id number]  Referring provider: [name], MD [address] [place], [place] [address]  No chief complaint on file.   HPI: Patient is 82 year old male with no previous urologic history presenting today for voiding trial after an acute episode of urinary retention after recent admission on 11/12/15 for sepsis and acute cholecystitis. A Foley catheter was placed during his admission with retention volume noted 900 cc. He denies any other previous urinary symptoms other than weak stream. He has never seen a urologist before and has not been on medications for an enlarged prostate. His CT scan did show a very large prostate protruding into his bladder. He also had some AKI which seemed to be resolving during admission and was attributed to his urinary retention. He was started on Flomax and presents today for catheter removal and voiding trial.  11/27/15 Cr 0.88  PMH: Past Medical History  Diagnosis Date  . CHF (congestive heart failure) ([place])   . Hypertension   . Diabetes mellitus without complication ([place])   . Coronary artery disease   . Hyperlipidemia   . Deafness   . Peripheral neuropathy ([place])   . Back pain   . Umbilical hernia   . Depression   . Melena   . COPD (chronic obstructive pulmonary disease) ([place])   . Skin cancer     Surgical History: Past Surgical History  Procedure Laterality Date  . Coronary artery bypass graft    . Cardiac catheterization Left 07/17/2015    Procedure: Right/Left Heart Cath and Coronary Angiography;  Surgeon: [name], MD;  Location: [facility];  Service: Cardiovascular;  Laterality: Left;  . Cardiac catheterization N/A 07/17/2015    Procedure: Coronary Stent Intervention;  Surgeon: [name], MD;  Location: [facility];  Service: Cardiovascular;  Laterality: N/A;  . Coronary angioplasty    . Esophagogastroduodenoscopy N/A 11/10/2015    Procedure:  ESOPHAGOGASTRODUODENOSCOPY (EGD);  Surgeon: [name], MD;  Location: [facility] ENDOSCOPY;  Service: Endoscopy;  Laterality: N/A;    Home Medications:    Medication List       This list is accurate as of: 12/02/15 12:39 PM.  Always use your most recent med list.               acetaminophen 325 MG tablet  Commonly known as:  TYLENOL  Take 650 mg by mouth every 6 (six) hours as needed.     amoxicillin-clavulanate 875-125 MG tablet  Commonly known as:  AUGMENTIN  Take 1 tablet by mouth 2 (two) times daily.     aspirin EC 81 MG tablet  Take 81 mg by mouth daily.     bisacodyl 10 MG suppository  Commonly known as:  DULCOLAX  Place 1 suppository (10 mg total) rectally once.     FLUoxetine 20 MG capsule  Commonly known as:  PROZAC  Take 20 mg by mouth daily.     furosemide 40 MG tablet  Commonly known as:  LASIX  Take 1 tablet (40 mg total) by mouth daily.     glyBURIDE 5 MG tablet  Commonly known as:  DIABETA  Take 5 mg by mouth daily with breakfast.     HYDROcodone-acetaminophen 5-325 MG tablet  Commonly known as:  NORCO/VICODIN  Take 1 tablet by mouth every 6 (six) hours as needed for moderate pain.     insulin aspart 100 UNIT/ML injection  Commonly known as:  novoLOG  Inject 0-9 Units into the skin 3 (three) times daily with meals.     lisinopril 20 MG tablet  Commonly known as:  PRINIVIL,ZESTRIL  Take 1 tablet by mouth daily.     lovastatin 20 MG tablet  Commonly known as:  MEVACOR  Take 20 mg by mouth at bedtime.     metFORMIN 500 MG tablet  Commonly known as:  GLUCOPHAGE  Take 500 mg by mouth 3 (three) times daily.     metoprolol succinate 25 MG 24 hr tablet  Commonly known as:  TOPROL XL  Take 1 tablet (25 mg total) by mouth 2 (two) times daily.     nitroGLYCERIN 0.4 MG SL tablet  Commonly known as:  NITROSTAT  Place 1 tablet under the tongue every 5 (five) minutes as needed.     omeprazole 20 MG capsule  Commonly known as:  PRILOSEC  Take 20 mg by  mouth daily.     oxyCODONE-acetaminophen 5-325 MG tablet  Commonly known as:  PERCOCET/ROXICET  Take 1-2 tablets by mouth every 6 (six) hours as needed for moderate pain.     pantoprazole 40 MG tablet  Commonly known as:  PROTONIX  Take 1 tablet by mouth daily.     senna-docusate 8.6-50 MG tablet  Commonly known as:  Senokot-S  Take 2 tablets by mouth 2 (two) times daily.     SPIRIVA RESPIMAT 2.5 MCG/ACT Aers  Generic drug:  Tiotropium Bromide Monohydrate  Inhale 2 puffs into the lungs daily.     tamsulosin 0.4 MG Caps capsule  Commonly known as:  FLOMAX  Take 1 capsule (0.4 mg total) by mouth daily after supper.     ticagrelor 90 MG Tabs tablet  Commonly known as:  BRILINTA  Take 1 tablet by mouth daily.     traZODone 50 MG tablet  Commonly known as:  DESYREL  Take 25 mg by mouth at bedtime as needed for sleep.     VENTOLIN HFA 108 (90 Base) MCG/ACT inhaler  Generic drug:  albuterol  Inhale 1-2 puffs into the lungs as needed.        Allergies: No Known Allergies  Family History: Family History  Problem Relation Age of Onset  . Anemia Neg Hx   . Arrhythmia Neg Hx   . Asthma Neg Hx   . Clotting disorder Neg Hx   . Fainting Neg Hx   . Heart attack Neg Hx   . Heart disease Neg Hx   . Heart failure Neg Hx   . Hyperlipidemia Neg Hx   . Hypertension Father   . CVA Father     Social History:  reports that he quit smoking about 37 years ago. His smoking use included Cigarettes. He has a 90 pack-year smoking history. He has never used smokeless tobacco. He reports that he does not drink alcohol or use illicit drugs.  ROS: UROLOGY Frequent Urination?: No Hard to postpone urination?: No Burning/pain with urination?: No Get up at night to urinate?: No Leakage of urine?: No Urine stream starts and stops?: No Trouble starting stream?: No Do you have to strain to urinate?: No Blood in urine?: No Urinary tract infection?: No Sexually transmitted disease?:  No Injury to kidneys or bladder?: No Painful intercourse?: No Weak stream?: No Erection problems?: No Penile pain?: No  Gastrointestinal Nausea?: No Vomiting?: No Indigestion/heartburn?: No Diarrhea?: No Constipation?: No  Constitutional Fever: No Night sweats?: No Weight loss?: No Fatigue?: No  Skin Skin rash/lesions?: No Itching?: No  Eyes Blurred vision?: No Double vision?: No  Ears/Nose/Throat Sore throat?: No Sinus problems?: No  Hematologic/Lymphatic Swollen glands?: No Easy bruising?: No  Cardiovascular Leg swelling?: Yes Chest pain?: No  Respiratory Cough?: No Shortness of breath?: No  Endocrine Excessive thirst?: No  Musculoskeletal Back pain?: No Joint pain?: No  Neurological Headaches?: No Dizziness?: No  Psychologic Depression?: No Anxiety?: No  Physical Exam: BP 148/63 mmHg  Pulse 59  Resp 16  SpO2 94%  Constitutional:  Alert and oriented, No acute distress. HEENT: Green AT, moist mucus membranes.  Trachea midline, no masses. Cardiovascular: No clubbing, cyanosis, or edema. Respiratory: Normal respiratory effort, no increased work of breathing. GI: Abdomen is soft, nontender, distended, midline hernia, cholecystostomy tube right upper abdomen GU: No CVA tenderness. DRE: deferred d/t patient's decreased mobility Skin: No rashes, bruises or suspicious lesions. Lymph: No cervical or inguinal adenopathy. Neurologic: Grossly intact, no focal deficits, moving all 4 extremities.BLLE edema Psychiatric: Normal mood and affect.  Laboratory Data:   Urinalysis    Component Value Date/Time   COLORURINE AMBER* 11/06/2015 1200   APPEARANCEUR CLEAR* 11/06/2015 1200   LABSPEC 1.019 11/06/2015 1200   PHURINE 5.0 11/06/2015 1200   GLUCOSEU NEGATIVE 11/06/2015 1200   HGBUR 1+* 11/06/2015 1200   BILIRUBINUR NEGATIVE 11/06/2015 1200   KETONESUR NEGATIVE 11/06/2015 1200   PROTEINUR >500* 11/06/2015 1200   NITRITE NEGATIVE 11/06/2015 1200    LEUKOCYTESUR NEGATIVE 11/06/2015 1200    Pertinent Imaging:   Assessment & Plan:   1. Urinary retention-  Retention s/p acute illness.  Foley catheter in place today. Patient was scheduled at lunchtime today. I advised him he would be in his best interest to postpone voiding trial until tomorrow morning. That way is if he is unsuccessful office will be open and he will not have to present to the emergency department for Foley catheter replacement. He is reluctant to learn CIC today. He will be  scheduled for an early morning appointment tomorrow for voiding trial.  2. BPH- Enlarged prostate seen on CT. We will continue Flomax.  May need to start Finasteride pending voiding trial results tomorrow.  3. AKI- Resolved. D/t acute urinary retention. Cr now WNL.   There are no diagnoses linked to this encounter.  Return in about 2 weeks (around 12/16/2015) for tomorrow nurse visit voiding trial and CIC instructions.  These notes generated with voice recognition software. I apologize for typographical errors.  Herbert Moors, [facility] [address] [place], [place] [address] [phone]

## 2015-12-03 ENCOUNTER — Ambulatory Visit: Payer: Medicare Other | Admitting: Family Medicine

## 2015-12-03 DIAGNOSIS — N400 Enlarged prostate without lower urinary tract symptoms: Secondary | ICD-10-CM

## 2015-12-03 NOTE — Progress Notes (Signed)
Catheter Removal  Patient is present today for a catheter removal.  80ml of water was drained from the balloon. A 14FR foley cath was removed from the bladder no complications were noted . Patient tolerated well.  Preformed by: Toniann Fail, LPN   Follow up/ Additional notes: Pt and family were advised to come back to clinic by 3pm if not able to urinate. Made pt and family aware if not able to urinate pt will learn CIC. Pt and family voiced understanding.

## 2015-12-08 ENCOUNTER — Telehealth: Payer: Self-pay | Admitting: Surgery

## 2015-12-08 NOTE — Telephone Encounter (Signed)
Noted. Will look for results after this date with Dr. Candace Cruise.

## 2015-12-08 NOTE — Telephone Encounter (Signed)
Patient was last seen by Dr Burt Knack in the office on 11/25/15 -  He was being seen for discussing placement of a cholecystostomy tube and laparoscopic cholecystectomy (see notes) Patients wife was just calling to let nurse and Dr Burt Knack know that patient has a colonoscopy scheduled with Dr Candace Cruise on January Friday 13th. Not necessary to call back unless you need to speak with them, she is just keeping Dr Burt Knack aware of what is going on with the patient.

## 2015-12-11 ENCOUNTER — Encounter: Payer: Self-pay | Admitting: *Deleted

## 2015-12-12 ENCOUNTER — Ambulatory Visit: Payer: Medicare Other | Admitting: Anesthesiology

## 2015-12-12 ENCOUNTER — Encounter: Payer: Self-pay | Admitting: *Deleted

## 2015-12-12 ENCOUNTER — Ambulatory Visit
Admission: RE | Admit: 2015-12-12 | Discharge: 2015-12-12 | Disposition: A | Discharge status: Home / Self Care | Payer: Medicare Other | Source: Ambulatory Visit | Attending: Gastroenterology | Admitting: Gastroenterology

## 2015-12-12 ENCOUNTER — Encounter
Admission: RE | Disposition: A | Discharge status: Home / Self Care | Payer: Self-pay | Source: Ambulatory Visit | Attending: Gastroenterology

## 2015-12-12 DIAGNOSIS — G629 Polyneuropathy, unspecified: Secondary | ICD-10-CM | POA: Diagnosis not present

## 2015-12-12 DIAGNOSIS — J441 Chronic obstructive pulmonary disease with (acute) exacerbation: Secondary | ICD-10-CM | POA: Insufficient documentation

## 2015-12-12 DIAGNOSIS — E785 Hyperlipidemia, unspecified: Secondary | ICD-10-CM | POA: Insufficient documentation

## 2015-12-12 DIAGNOSIS — Z85820 Personal history of malignant melanoma of skin: Secondary | ICD-10-CM | POA: Diagnosis not present

## 2015-12-12 DIAGNOSIS — D122 Benign neoplasm of ascending colon: Secondary | ICD-10-CM | POA: Insufficient documentation

## 2015-12-12 DIAGNOSIS — Z6831 Body mass index (BMI) 31.0-31.9, adult: Secondary | ICD-10-CM | POA: Insufficient documentation

## 2015-12-12 DIAGNOSIS — E1165 Type 2 diabetes mellitus with hyperglycemia: Secondary | ICD-10-CM | POA: Insufficient documentation

## 2015-12-12 DIAGNOSIS — D374 Neoplasm of uncertain behavior of colon: Secondary | ICD-10-CM | POA: Diagnosis not present

## 2015-12-12 DIAGNOSIS — Z7984 Long term (current) use of oral hypoglycemic drugs: Secondary | ICD-10-CM | POA: Diagnosis not present

## 2015-12-12 DIAGNOSIS — K5730 Diverticulosis of large intestine without perforation or abscess without bleeding: Secondary | ICD-10-CM | POA: Insufficient documentation

## 2015-12-12 DIAGNOSIS — K921 Melena: Secondary | ICD-10-CM | POA: Diagnosis present

## 2015-12-12 DIAGNOSIS — I2510 Atherosclerotic heart disease of native coronary artery without angina pectoris: Secondary | ICD-10-CM | POA: Diagnosis not present

## 2015-12-12 DIAGNOSIS — M549 Dorsalgia, unspecified: Secondary | ICD-10-CM | POA: Diagnosis not present

## 2015-12-12 DIAGNOSIS — D509 Iron deficiency anemia, unspecified: Secondary | ICD-10-CM | POA: Insufficient documentation

## 2015-12-12 DIAGNOSIS — Z79899 Other long term (current) drug therapy: Secondary | ICD-10-CM | POA: Insufficient documentation

## 2015-12-12 DIAGNOSIS — H9193 Unspecified hearing loss, bilateral: Secondary | ICD-10-CM | POA: Insufficient documentation

## 2015-12-12 DIAGNOSIS — E669 Obesity, unspecified: Secondary | ICD-10-CM | POA: Diagnosis not present

## 2015-12-12 DIAGNOSIS — I5033 Acute on chronic diastolic (congestive) heart failure: Secondary | ICD-10-CM | POA: Insufficient documentation

## 2015-12-12 DIAGNOSIS — I10 Essential (primary) hypertension: Secondary | ICD-10-CM | POA: Insufficient documentation

## 2015-12-12 DIAGNOSIS — F329 Major depressive disorder, single episode, unspecified: Secondary | ICD-10-CM | POA: Insufficient documentation

## 2015-12-12 DIAGNOSIS — H0520 Unspecified exophthalmos: Secondary | ICD-10-CM | POA: Diagnosis not present

## 2015-12-12 HISTORY — DX: Obesity, unspecified: E66.9

## 2015-12-12 HISTORY — PX: COLONOSCOPY WITH PROPOFOL: SHX5780

## 2015-12-12 SURGERY — COLONOSCOPY WITH PROPOFOL
Anesthesia: General

## 2015-12-12 MED ORDER — PROPOFOL 10 MG/ML IV BOLUS
INTRAVENOUS | Status: DC | PRN
  Administered 2015-12-12 (×3): 10 mg via INTRAVENOUS
  Administered 2015-12-12: 20 mg via INTRAVENOUS

## 2015-12-12 MED ORDER — SODIUM CHLORIDE 0.9 % IV SOLN
INTRAVENOUS | Status: DC
  Administered 2015-12-12: 12:00:00 via INTRAVENOUS

## 2015-12-12 MED ORDER — SODIUM CHLORIDE 0.9 % IV SOLN
INTRAVENOUS | Status: DC
  Administered 2015-12-12: 1000 mL via INTRAVENOUS

## 2015-12-12 MED ORDER — LIDOCAINE HCL (CARDIAC) 20 MG/ML IV SOLN
INTRAVENOUS | Status: DC | PRN
  Administered 2015-12-12: 60 mg via INTRAVENOUS

## 2015-12-12 NOTE — H&P (Signed)
  Date of Initial H&P: 12/02/2015  History reviewed, patient examined, no change in status, stable for surgery.

## 2015-12-12 NOTE — Op Note (Signed)
[facility] Gastroenterology Patient Name: Justin Leonard Procedure Date: 12/12/2015 12:04 PM MRN: [id number] Account #: [id number] Date of Birth: [birth year] Admit Type: Outpatient Age: 82 Room: [facility] ENDO ROOM 4 Gender: Male Note Status: Finalized Procedure:         Colonoscopy Indications:       Melena, Iron deficiency anemia Providers:         Lupita Dawn. Candace Cruise, MD Referring MD:      Forest Gleason Md, MD (Referring MD) Medicines:         Monitored Anesthesia Care Complications:     No immediate complications. Procedure:         Pre-Anesthesia Assessment:                    - Prior to the procedure, a History and Physical was                     performed, and patient medications, allergies and                     sensitivities were reviewed. The patient's tolerance of                     previous anesthesia was reviewed.                    - The risks and benefits of the procedure and the sedation                     options and risks were discussed with the patient. All                     questions were answered and informed consent was obtained.                    - After reviewing the risks and benefits, the patient was                     deemed in satisfactory condition to undergo the procedure.                    After obtaining informed consent, the colonoscope was                     passed under direct vision. Throughout the procedure, the                     patient's blood pressure, pulse, and oxygen saturations                     were monitored continuously. The Olympus CF-Q160AL                     colonoscope (S#. [id number]) was introduced through the anus                     and advanced to the the cecum, identified by appendiceal                     orifice and ileocecal valve. The colonoscopy was performed                     without difficulty. The patient tolerated the procedure  well. The quality of the bowel preparation was  fair. Findings:      Multiple small and large-mouthed diverticula were found in the sigmoid       colon and in the descending colon.      A small polyp was found in the ascending colon. The polyp was sessile.       The polyp was removed with a cold snare. Resection and retrieval were       complete.      Two sessile polyps were found in the sigmoid colon. The polyps were       small in size. These polyps were removed with a cold snare. Resection       and retrieval were complete.      The exam was otherwise without abnormality. Impression:        - Diverticulosis in the sigmoid colon and in the                     descending colon.                    - One small polyp in the ascending colon. Resected and                     retrieved.                    - Two small polyps in the sigmoid colon. Resected and                     retrieved.                    - The examination was otherwise normal. Recommendation:    - Discharge patient to home.                    - Await pathology results.                    - The findings and recommendations were discussed with the                     patient. Procedure Code(s): --- Professional ---                    [phone], Colonoscopy, flexible; with removal of tumor(s),                     polyp(s), or other lesion(s) by snare technique Diagnosis Code(s): --- Professional ---                    D12.2, Benign neoplasm of ascending colon                    D12.5, Benign neoplasm of sigmoid colon                    K92.1, Melena                    D50.9, Iron deficiency anemia, unspecified                    K57.30, Diverticulosis of large intestine without                     perforation or abscess without bleeding CPT copyright 2014 American Medical [employer]. All rights reserved. The  codes documented in this report are preliminary and upon coder review may  be revised to meet current compliance requirements. [name], MD 12/12/2015 12:33:02  PM This report has been signed electronically. Number of Addenda: 0 Note Initiated On: 12/12/2015 12:04 PM Scope Withdrawal Time: 0 hours 10 minutes 36 seconds  Total Procedure Duration: 0 hours 19 minutes 39 seconds       [facility]

## 2015-12-12 NOTE — Transfer of Care (Signed)
Immediate Anesthesia Transfer of Care Note  Patient: Justin Leonard  Procedure(s) Performed: Procedure(s): COLONOSCOPY WITH PROPOFOL (N/A)  Patient Location: Endoscopy Unit  Anesthesia Type:General  Level of Consciousness: awake, alert , oriented and patient cooperative  Airway & Oxygen Therapy: Patient Spontanous Breathing and Patient connected to nasal cannula oxygen  Post-op Assessment: Report given to RN, Post -op Vital signs reviewed and stable and Patient moving all extremities X 4  Post vital signs: Reviewed and stable  Last Vitals:  Filed Vitals:   12/12/15 1009  BP: 124/58  Pulse: 71  Temp: 36.5 C  Resp: 16    Complications: No apparent anesthesia complications

## 2015-12-12 NOTE — Anesthesia Preprocedure Evaluation (Signed)
Anesthesia Evaluation  Patient identified by MRN, date of birth, ID band Patient awake    Reviewed: Allergy & Precautions, H&P , NPO status , Patient's Chart, lab work & pertinent test results, reviewed documented beta blocker date and time   Airway Mallampati: III  TM Distance: >3 FB Neck ROM: full    Dental no notable dental hx. (+) Upper Dentures, Lower Dentures   Pulmonary neg pulmonary ROS, COPD, former smoker,  On home o2 prn.  Pulmonary status reportedly at baseline and comfortable today with sats in the low 90s.on O2   Pulmonary exam normal breath sounds clear to auscultation       Cardiovascular Exercise Tolerance: Good hypertension, + CAD and +CHF  negative cardio ROS   Rhythm:regular Rate:Normal     Neuro/Psych PSYCHIATRIC DISORDERS  Neuromuscular disease negative neurological ROS  negative psych ROS   GI/Hepatic negative GI ROS, Neg liver ROS,   Endo/Other  negative endocrine ROSdiabetes  Renal/GU negative Renal ROS  negative genitourinary   Musculoskeletal   Abdominal   Peds  Hematology negative hematology ROS (+)   Anesthesia Other Findings   Reproductive/Obstetrics negative OB ROS                             Anesthesia Physical Anesthesia Plan  ASA: IV  Anesthesia Plan: General   Post-op Pain Management:    Induction:   Airway Management Planned:   Additional Equipment:   Intra-op Plan:   Post-operative Plan:   Informed Consent: I have reviewed the patients History and Physical, chart, labs and discussed the procedure including the risks, benefits and alternatives for the proposed anesthesia with the patient or authorized representative who has indicated his/her understanding and acceptance.   Dental Advisory Given  Plan Discussed with: CRNA  Anesthesia Plan Comments:         Anesthesia Quick Evaluation

## 2015-12-14 NOTE — Anesthesia Postprocedure Evaluation (Signed)
Anesthesia Post Note  Patient: Justin Leonard  Procedure(s) Performed: Procedure(s) (LRB): COLONOSCOPY WITH PROPOFOL (N/A)  Patient location during evaluation: PACU Anesthesia Type: General Level of consciousness: awake and alert Pain management: pain level controlled Vital Signs Assessment: post-procedure vital signs reviewed and stable Respiratory status: spontaneous breathing, nonlabored ventilation, respiratory function stable and patient connected to nasal cannula oxygen Cardiovascular status: blood pressure returned to baseline and stable Postop Assessment: no signs of nausea or vomiting Anesthetic complications: no    Last Vitals:  Filed Vitals:   12/12/15 1255 12/12/15 1305  BP: 149/63 151/72  Pulse: 63 62  Temp:    Resp: 16 16    Last Pain:  Filed Vitals:   12/13/15 1513  PainSc: 0-No pain                 Molli Barrows

## 2015-12-15 LAB — SURGICAL PATHOLOGY

## 2015-12-16 ENCOUNTER — Ambulatory Visit (INDEPENDENT_AMBULATORY_CARE_PROVIDER_SITE_OTHER): Payer: Medicare Other | Admitting: Obstetrics and Gynecology

## 2015-12-16 ENCOUNTER — Telehealth: Payer: Self-pay | Admitting: Surgery

## 2015-12-16 ENCOUNTER — Encounter: Payer: Self-pay | Admitting: Obstetrics and Gynecology

## 2015-12-16 VITALS — BP 134/62 | HR 75 | Resp 16 | Wt 222.4 lb

## 2015-12-16 DIAGNOSIS — R339 Retention of urine, unspecified: Secondary | ICD-10-CM | POA: Diagnosis not present

## 2015-12-16 DIAGNOSIS — N400 Enlarged prostate without lower urinary tract symptoms: Secondary | ICD-10-CM

## 2015-12-16 LAB — URINALYSIS, COMPLETE
Bilirubin, UA: NEGATIVE
GLUCOSE, UA: NEGATIVE
KETONES UA: NEGATIVE
LEUKOCYTES UA: NEGATIVE
Nitrite, UA: NEGATIVE
RBC, UA: NEGATIVE
SPEC GRAV UA: 1.015 (ref 1.005–1.030)
Urobilinogen, Ur: 0.2 mg/dL (ref 0.2–1.0)
pH, UA: 5 (ref 5.0–7.5)

## 2015-12-16 LAB — MICROSCOPIC EXAMINATION
Epithelial Cells (non renal): NONE SEEN /hpf (ref 0–10)
RBC, UA: NONE SEEN /hpf (ref 0–?)

## 2015-12-16 LAB — BLADDER SCAN AMB NON-IMAGING: Scan Result: 77

## 2015-12-16 MED ORDER — TAMSULOSIN HCL 0.4 MG PO CAPS: 0.4000 mg | ORAL_CAPSULE | Freq: Every day | ORAL | Status: DC

## 2015-12-16 NOTE — Progress Notes (Signed)
11:52 AM   Justin Leonard 11/06/1927 [id number]  Referring provider: [name], MD [address] [place], [place] [address]  Chief Complaint  Patient presents with  . Benign Prostatic Hypertrophy    HPI: Patient is 82 year old male with no previous urologic history presenting today for voiding trial after an acute episode of urinary retention after recent admission on 11/12/15 for sepsis and acute cholecystitis. A Foley catheter was placed during his admission with retention volume noted 900 cc. He denies any other previous urinary symptoms other than weak stream. He has never seen a urologist before and has not been on medications for an enlarged prostate. His CT scan did show a very large prostate protruding into his bladder. He also had some AKI which seemed to be resolving during admission and was attributed to his urinary retention. He was started on Flomax and presents today for catheter removal and voiding trial.  11/27/15 Cr 0.88  Current Status:   PMH: Past Medical History  Diagnosis Date  . CHF (congestive heart failure) ([facility])   . Hypertension   . Diabetes mellitus without complication ([facility])   . Coronary artery disease   . Hyperlipidemia   . Deafness   . Peripheral neuropathy ([facility])   . Back pain   . Umbilical hernia   . Depression   . Melena   . COPD (chronic obstructive pulmonary disease) ([place])   . Obesity   . Peripheral neuropathy ([name])     bil.hands and feet  . Skin cancer     Surgical History: Past Surgical History  Procedure Laterality Date  . Coronary artery bypass graft    . Cardiac catheterization Left 07/17/2015    Procedure: Right/Left Heart Cath and Coronary Angiography;  Surgeon: [name], MD;  Location: [facility];  Service: Cardiovascular;  Laterality: Left;  . Cardiac catheterization N/A 07/17/2015    Procedure: Coronary Stent Intervention;  Surgeon: [name], MD;  Location: [facility];  Service:  Cardiovascular;  Laterality: N/A;  . Coronary angioplasty    . Esophagogastroduodenoscopy N/A 11/10/2015    Procedure: ESOPHAGOGASTRODUODENOSCOPY (EGD);  Surgeon: [name], MD;  Location: [facility];  Service: Endoscopy;  Laterality: N/A;  . Gall bladder drain      Home Medications:    Medication List       This list is accurate as of: 12/16/15 11:52 AM.  Always use your most recent med list.               acetaminophen 325 MG tablet  Commonly known as:  TYLENOL  Take 650 mg by mouth every 6 (six) hours as needed. Reported on 12/16/2015     aspirin EC 81 MG tablet  Take 81 mg by mouth daily.     bisacodyl 10 MG suppository  Commonly known as:  DULCOLAX  Place 1 suppository (10 mg total) rectally once.     FLUoxetine 20 MG capsule  Commonly known as:  PROZAC  Take 20 mg by mouth daily.     furosemide 40 MG tablet  Commonly known as:  LASIX  Take 1 tablet (40 mg total) by mouth daily.     HYDROcodone-acetaminophen 5-325 MG tablet  Commonly known as:  NORCO/VICODIN  Take 1 tablet by mouth every 6 (six) hours as needed for moderate pain. Reported on 12/16/2015     lovastatin 20 MG tablet  Commonly known as:  MEVACOR  Take 20 mg by mouth at bedtime.  metFORMIN 500 MG tablet  Commonly known as:  GLUCOPHAGE  Take 500 mg by mouth 3 (three) times daily.     metoprolol succinate 25 MG 24 hr tablet  Commonly known as:  TOPROL XL  Take 1 tablet (25 mg total) by mouth 2 (two) times daily.     nitroGLYCERIN 0.4 MG SL tablet  Commonly known as:  NITROSTAT  Place 1 tablet under the tongue every 5 (five) minutes as needed. Reported on 12/16/2015     omeprazole 20 MG capsule  Commonly known as:  PRILOSEC  Take 20 mg by mouth daily.     oxyCODONE-acetaminophen 5-325 MG tablet  Commonly known as:  PERCOCET/ROXICET  Take 1-2 tablets by mouth every 6 (six) hours as needed for moderate pain.     pantoprazole 40 MG tablet  Commonly known as:  PROTONIX  Take 1 tablet by  mouth daily.     senna-docusate 8.6-50 MG tablet  Commonly known as:  Senokot-S  Take 2 tablets by mouth 2 (two) times daily.     SPIRIVA RESPIMAT 2.5 MCG/ACT Aers  Generic drug:  Tiotropium Bromide Monohydrate  Inhale 2 puffs into the lungs daily.     tamsulosin 0.4 MG Caps capsule  Commonly known as:  FLOMAX  Take 1 capsule (0.4 mg total) by mouth daily after supper.     traZODone 50 MG tablet  Commonly known as:  DESYREL  Take 25 mg by mouth at bedtime as needed for sleep. Reported on 12/16/2015     VENTOLIN HFA 108 (90 Base) MCG/ACT inhaler  Generic drug:  albuterol  Inhale 1-2 puffs into the lungs as needed. Reported on 12/16/2015        Allergies: No Known Allergies  Family History: Family History  Problem Relation Age of Onset  . Anemia Neg Hx   . Arrhythmia Neg Hx   . Asthma Neg Hx   . Clotting disorder Neg Hx   . Fainting Neg Hx   . Heart attack Neg Hx   . Heart disease Neg Hx   . Heart failure Neg Hx   . Hyperlipidemia Neg Hx   . Hypertension Father   . CVA Father     Social History:  reports that he quit smoking about 37 years ago. His smoking use included Cigarettes. He has a 90 pack-year smoking history. He has never used smokeless tobacco. He reports that he does not drink alcohol or use illicit drugs.  ROS: UROLOGY Frequent Urination?: No Hard to postpone urination?: No Burning/pain with urination?: No Get up at night to urinate?: Yes Leakage of urine?: No Urine stream starts and stops?: No Trouble starting stream?: No Do you have to strain to urinate?: No Blood in urine?: No Urinary tract infection?: No Sexually transmitted disease?: No Injury to kidneys or bladder?: No Painful intercourse?: No Weak stream?: No Erection problems?: No Penile pain?: No  Gastrointestinal Nausea?: No Vomiting?: No Indigestion/heartburn?: No Diarrhea?: No Constipation?: Yes  Constitutional Fever: No Night sweats?: No Weight loss?: No Fatigue?:  No  Skin Skin rash/lesions?: No Itching?: No  Eyes Blurred vision?: No Double vision?: No  Ears/Nose/Throat Sore throat?: No Sinus problems?: No  Hematologic/Lymphatic Swollen glands?: No Easy bruising?: No  Cardiovascular Leg swelling?: Yes Chest pain?: No  Respiratory Cough?: No Shortness of breath?: No  Endocrine Excessive thirst?: No  Musculoskeletal Back pain?: No Joint pain?: No  Neurological Headaches?: No Dizziness?: No  Psychologic Depression?: No Anxiety?: No  Physical Exam: BP 134/62 mmHg  Pulse  75  Resp 16  Wt 222 lb 6.4 oz (100.88 kg)  Constitutional:  Alert and oriented, No acute distress. HEENT: [place] AT, moist mucus membranes.  Trachea midline, no masses. Cardiovascular: No clubbing, cyanosis, or edema. Respiratory: Normal respiratory effort, no increased work of breathing. DRE: deferred d/t patient's decreased mobility Skin: No rashes, bruises or suspicious lesions. Lymph: No cervical or inguinal adenopathy. Neurologic: Grossly intact, no focal deficits, moving all 4 extremities.BLLE edema Psychiatric: Normal mood and affect.  Laboratory Data:   Urinalysis Results for orders placed or performed in visit on 12/16/15  Microscopic Examination  Result Value Ref Range   WBC, UA 0-5 0 -  5 /hpf   RBC, UA None seen 0 -  2 /hpf   Epithelial Cells (non renal) None seen 0 - 10 /hpf   Mucus, UA Present (A) Not Estab.   Bacteria, UA Few (A) None seen/Few  Urinalysis, Complete  Result Value Ref Range   Specific Gravity, UA 1.015 1.005 - 1.030   pH, UA 5.0 5.0 - 7.5   Color, UA Yellow Yellow   Appearance Ur Clear Clear   Leukocytes, UA Negative Negative   Protein, UA 2+ (A) Negative/Trace   Glucose, UA Negative Negative   Ketones, UA Negative Negative   RBC, UA Negative Negative   Bilirubin, UA Negative Negative   Urobilinogen, Ur 0.2 0.2 - 1.0 mg/dL   Nitrite, UA Negative Negative   Microscopic Examination See below:   BLADDER SCAN  AMB NON-IMAGING  Result Value Ref Range   Scan Result 77 mL      Pertinent Imaging:   Assessment & Plan:   1. Urinary retention-  Patient reports that he has been voiding well. PVR minimal 77 ML's a day.   2. BPH- Enlarged prostate seen on CT. We will continue Flomax.  She reports that he is satisfied with his symptom management on Flomax. He is still experiencing some nocturia. Favor modifications recommended. Prescription sent to his pharmacy. He will follow up in 6 months for recheck or as needed.  3. AKI- Resolved. D/t acute urinary retention. Cr now WNL.    There are no diagnoses linked to this encounter.  Return in about 6 months (around 06/14/2016) for BPH; urinary retention.  These notes generated with voice recognition software. I apologize for typographical errors.  Herbert Moors, [facility] [address] [place], [place] [address] [phone]

## 2015-12-16 NOTE — Telephone Encounter (Signed)
Clearance forms have been sent over to Cardiologist (Dr. [name]), Pulmonologist (Dr. Raul Del), and PCP (Dr. Kym Groom). Awaiting return of clearances at this time. Patient just saw PCP on 12/09/15 and cardiology on 12/04/15. He has an upcoming appointment on 12/24/15.  Upon speaking with the patient's wife, Letta Median; She explains that up until Saturday the Cholecystostomy tube has been draining 3-4 oz. Daily, Saturday drained 1 oz, Sunday and Monday only 1 oz between both days, and now is not draining at all. Patient denies nausea, vomiting, fever, or pain. No drainage around the tube as well.   Explained that I would speak with the surgeon on call today and get back to patient's wife shortly afterwards.

## 2015-12-16 NOTE — Telephone Encounter (Signed)
Please call patients wife, Letta Median. She is calling to let the nurse know how the patient is doing. Patient had a cholecystostomy tube placed by Dr Burt Knack on 12/27 - at first there were 3-4 ounces of liquid draining, now there is no liquid draining. She would like to know that this is okay. She also wanted to let us know that the patient had a colonoscopy with Dr Candace Cruise last Friday (12/12/15). Please call at your convenience. Thank you.

## 2015-12-16 NOTE — Telephone Encounter (Signed)
Spoke with Dr. Azalee Course at this time. Explained situation with Cholecystostomy drain that is currently not draining, she does not feel that anything further needs to be done at this time as patient is asymptomatic currently. But if patient becomes symptomatic at all, he needs to call office immediately.  Informed patient's wife of this information. She will call back with further symptoms or problems.

## 2015-12-17 ENCOUNTER — Telehealth: Payer: Self-pay

## 2015-12-17 ENCOUNTER — Encounter: Payer: Self-pay | Admitting: Gastroenterology

## 2015-12-17 NOTE — Telephone Encounter (Signed)
Spoke with Dr. Burt Knack at this time. He would like to see patient in the office on 12/29/15. Appointment made at this time. Informed patient's wife that she should call prior to appointment if the patient develops abdominal pain, fever, or nausea/vomiting.  Patient's wife verbalizes understanding of this.

## 2015-12-24 ENCOUNTER — Telehealth: Payer: Self-pay

## 2015-12-24 NOTE — Telephone Encounter (Signed)
Call made to Dr. April Manson office at this time as I have not received Clearance. No answer on nurse line. Left message for return for update on this.  Clearance from PCP (Dr. Kym Groom) is in chart under media currently.  Awaiting appointment on 12/24/15 to get clearance from Dr. Raul Del.

## 2015-12-24 NOTE — Telephone Encounter (Signed)
Note from Dr. Humphrey Rolls from 1/5 office visit received but no indication whether surgery clearance is being given. Clearance letter refaxed to [place]. At this time. Call placed at same time to her and voicemail left regarding this.

## 2015-12-26 NOTE — Telephone Encounter (Signed)
Call made to Dr. Humphrey Rolls at this time, spoke with Olin Hauser who states that their was some confusion as they thought we needed clearance for a colonoscopy. They have scheduled the patient to come in for an appointment on 2/6 and will update clearance form at that visit.  Clearance form refaxed at this time to [place] [phone]

## 2015-12-29 ENCOUNTER — Encounter: Payer: Self-pay | Admitting: Surgery

## 2015-12-29 ENCOUNTER — Ambulatory Visit (INDEPENDENT_AMBULATORY_CARE_PROVIDER_SITE_OTHER): Payer: Medicare Other | Admitting: Surgery

## 2015-12-29 VITALS — BP 139/69 | HR 80 | Temp 97.6°F | Ht 75.0 in | Wt 222.6 lb

## 2015-12-29 DIAGNOSIS — K8000 Calculus of gallbladder with acute cholecystitis without obstruction: Secondary | ICD-10-CM | POA: Diagnosis not present

## 2015-12-29 NOTE — Progress Notes (Signed)
Outpatient Surgical Follow Up  12/29/2015  Justin Leonard is an 82 y.o. male.   CC: Cholecystostomy tube  HPI: This a patient with multiple medical problems who is critically ill in the ICU and a cholecystostomy tube was placed for acute cholecystitis. His sepsis has resolved and he is doing better.  Past Medical History  Diagnosis Date  . CHF (congestive heart failure) ([facility])   . Hypertension   . Diabetes mellitus without complication ([facility])   . Coronary artery disease   . Hyperlipidemia   . Deafness   . Peripheral neuropathy ([facility])   . Back pain   . Umbilical hernia   . Depression   . Melena   . COPD (chronic obstructive pulmonary disease) ([place])   . Obesity   . Peripheral neuropathy ([name])     bil.hands and feet  . Skin cancer     Past Surgical History  Procedure Laterality Date  . Coronary artery bypass graft    . Cardiac catheterization Left 07/17/2015    Procedure: Right/Left Heart Cath and Coronary Angiography;  Surgeon: [name], MD;  Location: [facility];  Service: Cardiovascular;  Laterality: Left;  . Cardiac catheterization N/A 07/17/2015    Procedure: Coronary Stent Intervention;  Surgeon: [name], MD;  Location: [facility];  Service: Cardiovascular;  Laterality: N/A;  . Coronary angioplasty    . Esophagogastroduodenoscopy N/A 11/10/2015    Procedure: ESOPHAGOGASTRODUODENOSCOPY (EGD);  Surgeon: [name], MD;  Location: [facility] ENDOSCOPY;  Service: Endoscopy;  Laterality: N/A;  . Gall bladder drain    . Colonoscopy with propofol N/A 12/12/2015    Procedure: COLONOSCOPY WITH PROPOFOL;  Surgeon: [name], MD;  Location: [facility] ENDOSCOPY;  Service: Gastroenterology;  Laterality: N/A;    Family History  Problem Relation Age of Onset  . Anemia Neg Hx   . Arrhythmia Neg Hx   . Asthma Neg Hx   . Clotting disorder Neg Hx   . Fainting Neg Hx   . Heart attack Neg Hx   . Heart disease Neg Hx   . Heart failure Neg Hx   . Hyperlipidemia  Neg Hx   . Hypertension Father   . CVA Father     Social History:  reports that he quit smoking about 37 years ago. His smoking use included Cigarettes. He has a 90 pack-year smoking history. He has never used smokeless tobacco. He reports that he does not drink alcohol or use illicit drugs.  Allergies: No Known Allergies  Medications reviewed.   Review of Systems:   Review of Systems  Constitutional: Positive for malaise/fatigue. Negative for fever and chills.  HENT: Negative.   Eyes: Negative.   Respiratory: Negative.   Cardiovascular: Negative.   Gastrointestinal: Negative.  Negative for nausea and vomiting.       No Drainage from tube  Genitourinary: Negative.   Musculoskeletal: Negative.   Skin: Negative.   Neurological: Negative.   Endo/Heme/Allergies: Negative.   Psychiatric/Behavioral: Negative.      Physical Exam:  There were no vitals taken for this visit.  Physical Exam  Constitutional: He is oriented to person, place, and time and well-developed, well-nourished, and in no distress. No distress.  Weak appearing  HENT:  Head: Normocephalic and atraumatic.  Eyes: Pupils are equal, round, and reactive to light. Right eye exhibits no discharge. Left eye exhibits no discharge. No scleral icterus.  Neck: Normal range of motion.  Cardiovascular: Normal rate, regular rhythm and normal heart sounds.  Pulmonary/Chest: Effort normal. No respiratory distress. He has wheezes.  Abdominal: Soft. He exhibits no distension. There is no tenderness. There is no rebound and no guarding.  Drain is essentially dry  Lymphadenopathy:    He has no cervical adenopathy.  Neurological: He is alert and oriented to person, place, and time.  Skin: Skin is warm and dry. No rash noted. He is not diaphoretic. No erythema.  Psychiatric: Mood and affect normal.      No results found for this or any previous visit (from the past 48 hour(s)). No results  found.  Assessment/Plan:  Cholecystostomy tube is removed. It appeared to be under the skin only. rec f/u 3 weeks, doubt surgery will be needed in this weak, chronically ill pt  Florene Glen, MD, FACS

## 2015-12-29 NOTE — Patient Instructions (Signed)
We will see you back in 3 weeks.

## 2015-12-31 ENCOUNTER — Ambulatory Visit: Payer: Medicare Other | Attending: Family | Admitting: Family

## 2015-12-31 ENCOUNTER — Encounter: Payer: Self-pay | Admitting: Family

## 2015-12-31 VITALS — BP 132/45 | HR 65 | Resp 20 | Ht 74.0 in | Wt 222.0 lb

## 2015-12-31 DIAGNOSIS — Z7982 Long term (current) use of aspirin: Secondary | ICD-10-CM | POA: Insufficient documentation

## 2015-12-31 DIAGNOSIS — G629 Polyneuropathy, unspecified: Secondary | ICD-10-CM | POA: Diagnosis not present

## 2015-12-31 DIAGNOSIS — F329 Major depressive disorder, single episode, unspecified: Secondary | ICD-10-CM | POA: Diagnosis not present

## 2015-12-31 DIAGNOSIS — J449 Chronic obstructive pulmonary disease, unspecified: Secondary | ICD-10-CM | POA: Diagnosis not present

## 2015-12-31 DIAGNOSIS — I110 Hypertensive heart disease with heart failure: Secondary | ICD-10-CM | POA: Diagnosis not present

## 2015-12-31 DIAGNOSIS — Z87891 Personal history of nicotine dependence: Secondary | ICD-10-CM | POA: Diagnosis not present

## 2015-12-31 DIAGNOSIS — Z85828 Personal history of other malignant neoplasm of skin: Secondary | ICD-10-CM | POA: Diagnosis not present

## 2015-12-31 DIAGNOSIS — I2510 Atherosclerotic heart disease of native coronary artery without angina pectoris: Secondary | ICD-10-CM | POA: Insufficient documentation

## 2015-12-31 DIAGNOSIS — Z7984 Long term (current) use of oral hypoglycemic drugs: Secondary | ICD-10-CM | POA: Insufficient documentation

## 2015-12-31 DIAGNOSIS — E785 Hyperlipidemia, unspecified: Secondary | ICD-10-CM | POA: Insufficient documentation

## 2015-12-31 DIAGNOSIS — I10 Essential (primary) hypertension: Secondary | ICD-10-CM

## 2015-12-31 DIAGNOSIS — Z9889 Other specified postprocedural states: Secondary | ICD-10-CM | POA: Diagnosis not present

## 2015-12-31 DIAGNOSIS — I5032 Chronic diastolic (congestive) heart failure: Secondary | ICD-10-CM | POA: Insufficient documentation

## 2015-12-31 DIAGNOSIS — E119 Type 2 diabetes mellitus without complications: Secondary | ICD-10-CM | POA: Insufficient documentation

## 2015-12-31 DIAGNOSIS — Z794 Long term (current) use of insulin: Secondary | ICD-10-CM | POA: Insufficient documentation

## 2015-12-31 DIAGNOSIS — J410 Simple chronic bronchitis: Secondary | ICD-10-CM

## 2015-12-31 DIAGNOSIS — E669 Obesity, unspecified: Secondary | ICD-10-CM | POA: Insufficient documentation

## 2015-12-31 DIAGNOSIS — Z79899 Other long term (current) drug therapy: Secondary | ICD-10-CM | POA: Insufficient documentation

## 2015-12-31 NOTE — Progress Notes (Signed)
Subjective:    Patient ID: Justin Leonard, male    DOB: 1927/07/09, 82 y.o.   MRN: [id number]  Congestive Heart Failure Presents for follow-up visit. The disease course has been stable. Associated symptoms include edema and fatigue (mild). Pertinent negatives include no abdominal pain, chest pain, chest pressure, orthopnea, palpitations or shortness of breath. The symptoms have been stable. Past treatments include salt and fluid restriction and beta blockers. The treatment provided moderate relief. Compliance with prior treatments has been good. His past medical history is significant for CAD, chronic lung disease, DM and HTN. Compliance with total regimen is 76-100%.  Hypertension This is a chronic problem. The current episode started more than 1 year ago. The problem is unchanged. The problem is controlled. Associated symptoms include peripheral edema. Pertinent negatives include no chest pain, headaches, neck pain, palpitations or shortness of breath. There are no associated agents to hypertension. Risk factors for coronary artery disease include diabetes mellitus, dyslipidemia, family history and male gender. Past treatments include beta blockers, diuretics and lifestyle changes. The current treatment provides moderate improvement. There are no compliance problems.  Hypertensive end-organ damage includes CAD/MI and heart failure.    Past Medical History  Diagnosis Date  . CHF (congestive heart failure) ([place])   . Hypertension   . Diabetes mellitus without complication ([place])   . Coronary artery disease   . Hyperlipidemia   . Deafness   . Peripheral neuropathy ([place])   . Back pain   . Umbilical hernia   . Depression   . Melena   . COPD (chronic obstructive pulmonary disease) ([place])   . Obesity   . Peripheral neuropathy ([name])     bil.hands and feet  . Skin cancer     Past Surgical History  Procedure Laterality Date  . Coronary artery bypass graft    . Cardiac catheterization Left  07/17/2015    Procedure: Right/Left Heart Cath and Coronary Angiography;  Surgeon: [name], MD;  Location: [facility];  Service: Cardiovascular;  Laterality: Left;  . Cardiac catheterization N/A 07/17/2015    Procedure: Coronary Stent Intervention;  Surgeon: [name], MD;  Location: [facility];  Service: Cardiovascular;  Laterality: N/A;  . Coronary angioplasty    . Esophagogastroduodenoscopy N/A 11/10/2015    Procedure: ESOPHAGOGASTRODUODENOSCOPY (EGD);  Surgeon: [name], MD;  Location: [facility] END[facility]COPY;  Service: Endoscopy;  Laterality: N/A;  . Gall bladder drain    . Colonoscopy with propofol N/A 12/12/2015    Procedure: COLONOSCOPY WITH PROPOFOL;  Surgeon: [name], MD;  Location: [facility] ENDOSCOPY;  Service: Gastroenterology;  Laterality: N/A;    Family History  Problem Relation Age of Onset  . Anemia Neg Hx   . Arrhythmia Neg Hx   . Asthma Neg Hx   . Clotting disorder Neg Hx   . Fainting Neg Hx   . Heart attack Neg Hx   . Heart disease Neg Hx   . Heart failure Neg Hx   . Hyperlipidemia Neg Hx   . Hypertension Father   . CVA Father     Social History  Substance Use Topics  . Smoking status: Former Smoker -- 3.00 packs/day for 30 years    Types: Cigarettes    Quit date: 04/16/1978  . Smokeless tobacco: Never Used     Comment: Quit smoking 1979  . Alcohol Use: No    No Known Allergies  Prior to Admission medications   Medication Sig Start Date End Date  Taking? Authorizing Provider  acetaminophen (TYLENOL) 325 MG tablet Take 650 mg by mouth every 6 (six) hours as needed. Reported on 12/16/2015   Yes Historical Provider, MD  aspirin EC 81 MG tablet Take 81 mg by mouth daily.   Yes Historical Provider, MD  ferrous sulfate 325 (65 FE) MG tablet Take 325 mg by mouth daily with breakfast.   Yes Historical Provider, MD  FLUoxetine (PROZAC) 20 MG capsule Take 20 mg by mouth daily.   Yes Historical Provider, MD  furosemide (LASIX) 40 MG tablet Take  1 tablet (40 mg total) by mouth daily. Patient taking differently: Take 20 mg by mouth daily.  11/27/15  Yes Carrie Mew, MD  insulin glargine (LANTUS) 100 UNIT/ML injection Inject 10 Units into the skin at bedtime.   Yes Historical Provider, MD  lovastatin (MEVACOR) 20 MG tablet Take 20 mg by mouth at bedtime.   Yes Historical Provider, MD  metFORMIN (GLUCOPHAGE) 500 MG tablet Take 500 mg by mouth 3 (three) times daily.   Yes Historical Provider, MD  metoprolol tartrate (LOPRESSOR) 25 MG tablet Take 25 mg by mouth daily.   Yes Historical Provider, MD  nitroGLYCERIN (NITROSTAT) 0.4 MG SL tablet Place 1 tablet under the tongue every 5 (five) minutes as needed. Reported on 12/16/2015 07/15/15 07/14/16 Yes Historical Provider, MD  pantoprazole (PROTONIX) 40 MG tablet Take 1 tablet by mouth daily. 08/26/15  Yes Historical Provider, MD  senna-docusate (SENOKOT-S) 8.6-50 MG tablet Take 2 tablets by mouth 2 (two) times daily. 11/14/15  Yes Epifanio Lesches, MD  tamsulosin (FLOMAX) 0.4 MG CAPS capsule Take 1 capsule (0.4 mg total) by mouth daily after supper. 12/16/15  Yes Roda Shutters, FNP  Tiotropium Bromide Monohydrate (SPIRIVA RESPIMAT) 2.5 MCG/ACT AERS Inhale 2 puffs into the lungs daily.   Yes Historical Provider, MD  VENTOLIN HFA 108 (90 BASE) MCG/ACT inhaler Inhale 1-2 puffs into the lungs as needed. Reported on 12/16/2015 06/24/15  Yes Historical Provider, MD     Review of Systems  Constitutional: Positive for fatigue (mild). Negative for appetite change.  HENT: Positive for postnasal drip. Negative for congestion and sore throat.   Eyes: Negative.   Respiratory: Negative for cough, chest tightness and shortness of breath.   Cardiovascular: Positive for leg swelling (minimal). Negative for chest pain and palpitations.  Gastrointestinal: Positive for constipation. Negative for abdominal pain and abdominal distention.       + flatus   Endocrine: Negative.   Genitourinary: Negative.    Musculoskeletal: Positive for neck stiffness. Negative for back pain and neck pain.  Skin: Negative.   Allergic/Immunologic: Negative.   Neurological: Positive for dizziness. Negative for light-headedness and headaches.  Hematological: Negative for adenopathy. Bruises/bleeds easily.  Psychiatric/Behavioral: Negative for sleep disturbance (sleeping on 1 pillow with oxygen at 2L) and dysphoric mood. The patient is not nervous/anxious.        Objective:   Physical Exam  Constitutional: He is oriented to person, place, and time. He appears well-developed and well-nourished.  HENT:  Head: Normocephalic and atraumatic.  Eyes: Conjunctivae are normal. Pupils are equal, round, and reactive to light.  Neck: Normal range of motion. Neck supple.  Cardiovascular: Normal rate and regular rhythm.   Pulmonary/Chest: Effort normal. He has no wheezes. He has no rales.  Abdominal: Soft. He exhibits no distension. There is no tenderness.  Musculoskeletal: He exhibits edema (trace amount of pedal edema in bilateral lower legs). He exhibits no tenderness.  Neurological: He is alert and oriented to person,  place, and time.  Skin: Skin is warm and dry.  Psychiatric: He has a normal mood and affect. His behavior is normal. Thought content normal.  Nursing note and vitals reviewed.   BP 132/45 mmHg  Pulse 65  Resp 20  Ht 6\' 2"  (1.88 m)  Wt 222 lb (100.699 kg)  BMI 28.49 kg/m2  SpO2 94%       Assessment & Plan:  1: Chronic heart failure with preserved ejection fraction- Patient presents with a mild amount of fatigue upon exertion. Denies any shortness of breath. Does notice a little bit of swelling in his lower legs at times. Continues to weigh himself and says that he's lost weight due to recent hospitalizations. By our scale, he's lost 22 pounds since he was last here November 2016. Reminded to call for an overnight weight gain of >2 pounds or a weekly weight gain of >5 pounds. He is not adding any  salt to his food and his wife cooks with [employer] seasoning. He has been riding his stationary bike for about 15 minutes daily and feels like he's getting stronger with that. Does wear his oxygen at 2L at bedtime.  2: HTN- Blood pressure looks good today. Continue medications at this time.  3: COPD- Currently denies any shortness of breath and continues to use inhalers. 4: Diabetes- Fasting glucose this morning was 161. Follows with PCP regarding this.  Return here in 3 months or sooner for any questions/problems before then.

## 2015-12-31 NOTE — Patient Instructions (Signed)
Continue weighing daily and call for an overnight weight gain of > 2 pounds or a weekly weight gain of >5 pounds. 

## 2016-01-23 ENCOUNTER — Other Ambulatory Visit: Payer: Self-pay

## 2016-01-23 ENCOUNTER — Encounter: Payer: Self-pay | Admitting: Surgery

## 2016-01-23 ENCOUNTER — Ambulatory Visit (INDEPENDENT_AMBULATORY_CARE_PROVIDER_SITE_OTHER): Payer: Medicare Other | Admitting: Surgery

## 2016-01-23 VITALS — BP 124/56 | HR 77 | Temp 98.0°F | Ht 75.0 in | Wt 226.4 lb

## 2016-01-23 DIAGNOSIS — K8000 Calculus of gallbladder with acute cholecystitis without obstruction: Secondary | ICD-10-CM | POA: Diagnosis not present

## 2016-01-23 NOTE — Progress Notes (Signed)
Outpatient Surgical Follow Up  01/23/2016  Justin Leonard is an 82 y.o. male.   CC: History of acute cholecystitis  HPI: This patient with a history of acute cholecystitis of a cholecystostomy tube placed in the hospital it has subsequently been removed to the last visit several weeks ago. He also has several comorbidities including severe CHF but is very functional at this point.  Patient has no abdominal pain whatsoever his tolerating his regular diet has no fevers or chills  Past Medical History  Diagnosis Date  . CHF (congestive heart failure) ([place])   . Hypertension   . Diabetes mellitus without complication ([place])   . Coronary artery disease   . Hyperlipidemia   . Deafness   . Peripheral neuropathy ([place])   . Back pain   . Umbilical hernia   . Depression   . Melena   . COPD (chronic obstructive pulmonary disease) ([place])   . Obesity   . Peripheral neuropathy ([name])     bil.hands and feet  . Skin cancer     Past Surgical History  Procedure Laterality Date  . Coronary artery bypass graft    . Cardiac catheterization Left 07/17/2015    Procedure: Right/Left Heart Cath and Coronary Angiography;  Surgeon: [name], MD;  Location: [facility];  Service: Cardiovascular;  Laterality: Left;  . Cardiac catheterization N/A 07/17/2015    Procedure: Coronary Stent Intervention;  Surgeon: [name], MD;  Location: [facility];  Service: Cardiovascular;  Laterality: N/A;  . Coronary angioplasty    . Esophagogastroduodenoscopy N/A 11/10/2015    Procedure: ESOPHAGOGASTRODUODENOSCOPY (EGD);  Surgeon: [name], MD;  Location: [facility] ENDOSCOPY;  Service: Endoscopy;  Laterality: N/A;  . Gall bladder drain    . Colonoscopy with propofol N/A 12/12/2015    Procedure: COLONOSCOPY WITH PROPOFOL;  Surgeon: [name], MD;  [facility];  Service: Gastroenterology;  Laterality: N/A;    Family History  Problem Relation Age of Onset  . Anemia Neg Hx   .  Arrhythmia Neg Hx   . Asthma Neg Hx   . Clotting disorder Neg Hx   . Fainting Neg Hx   . Heart attack Neg Hx   . Heart disease Neg Hx   . Heart failure Neg Hx   . Hyperlipidemia Neg Hx   . Hypertension Father   . CVA Father     Social History:  reports that he quit smoking about 37 years ago. His smoking use included Cigarettes. He has a 90 pack-year smoking history. He has never used smokeless tobacco. He reports that he does not drink alcohol or use illicit drugs.  Allergies: No Known Allergies  Medications reviewed.   Review of Systems:   Review of Systems  Constitutional: Negative for fever and chills.  HENT: Negative.   Eyes: Negative.   Respiratory: Negative.   Cardiovascular: Negative.   Gastrointestinal: Negative.   Genitourinary: Negative.   Musculoskeletal: Negative.   Skin: Negative.      Physical Exam:  There were no vitals taken for this visit.  Physical Exam  Constitutional: He is oriented to person, place, and time. No distress.  Elderly male walks with a cane  HENT:  Head: Normocephalic and atraumatic.  Eyes: Pupils are equal, round, and reactive to light. Right eye exhibits no discharge. Left eye exhibits no discharge. No scleral icterus.  Abdominal: Soft. He exhibits no distension. There is no tenderness. There is no rebound and no guarding.  Neurological: He is alert and oriented to person, place, and time.  Skin: Skin is warm and dry. He is not diaphoretic. No erythema.  Vitals reviewed.     No results found for this or any previous visit (from the past 48 hour(s)). No results found.  Assessment/Plan:  Patient is doing very well recommend follow up on an as-needed basis no suggestion for the need for surgery at this point.  Florene Glen, MD, FACS

## 2016-01-23 NOTE — Patient Instructions (Signed)
Please call if you have any questions or concerns.  °

## 2016-03-30 ENCOUNTER — Ambulatory Visit: Payer: Medicare Other | Attending: Family | Admitting: Family

## 2016-03-30 ENCOUNTER — Encounter: Payer: Self-pay | Admitting: Family

## 2016-03-30 VITALS — BP 164/62 | HR 60 | Resp 18 | Ht 74.0 in | Wt 223.0 lb

## 2016-03-30 DIAGNOSIS — G629 Polyneuropathy, unspecified: Secondary | ICD-10-CM | POA: Diagnosis not present

## 2016-03-30 DIAGNOSIS — Z823 Family history of stroke: Secondary | ICD-10-CM | POA: Insufficient documentation

## 2016-03-30 DIAGNOSIS — Z794 Long term (current) use of insulin: Secondary | ICD-10-CM | POA: Diagnosis not present

## 2016-03-30 DIAGNOSIS — E119 Type 2 diabetes mellitus without complications: Secondary | ICD-10-CM | POA: Diagnosis not present

## 2016-03-30 DIAGNOSIS — J410 Simple chronic bronchitis: Secondary | ICD-10-CM

## 2016-03-30 DIAGNOSIS — H9190 Unspecified hearing loss, unspecified ear: Secondary | ICD-10-CM | POA: Insufficient documentation

## 2016-03-30 DIAGNOSIS — Z9889 Other specified postprocedural states: Secondary | ICD-10-CM | POA: Diagnosis not present

## 2016-03-30 DIAGNOSIS — I2510 Atherosclerotic heart disease of native coronary artery without angina pectoris: Secondary | ICD-10-CM | POA: Diagnosis not present

## 2016-03-30 DIAGNOSIS — F329 Major depressive disorder, single episode, unspecified: Secondary | ICD-10-CM | POA: Insufficient documentation

## 2016-03-30 DIAGNOSIS — I110 Hypertensive heart disease with heart failure: Secondary | ICD-10-CM | POA: Insufficient documentation

## 2016-03-30 DIAGNOSIS — E669 Obesity, unspecified: Secondary | ICD-10-CM | POA: Insufficient documentation

## 2016-03-30 DIAGNOSIS — Z85828 Personal history of other malignant neoplasm of skin: Secondary | ICD-10-CM | POA: Diagnosis not present

## 2016-03-30 DIAGNOSIS — E785 Hyperlipidemia, unspecified: Secondary | ICD-10-CM | POA: Insufficient documentation

## 2016-03-30 DIAGNOSIS — J449 Chronic obstructive pulmonary disease, unspecified: Secondary | ICD-10-CM | POA: Insufficient documentation

## 2016-03-30 DIAGNOSIS — I5032 Chronic diastolic (congestive) heart failure: Secondary | ICD-10-CM | POA: Insufficient documentation

## 2016-03-30 DIAGNOSIS — Z7982 Long term (current) use of aspirin: Secondary | ICD-10-CM | POA: Insufficient documentation

## 2016-03-30 DIAGNOSIS — Z79899 Other long term (current) drug therapy: Secondary | ICD-10-CM | POA: Insufficient documentation

## 2016-03-30 DIAGNOSIS — Z87891 Personal history of nicotine dependence: Secondary | ICD-10-CM | POA: Diagnosis not present

## 2016-03-30 DIAGNOSIS — I10 Essential (primary) hypertension: Secondary | ICD-10-CM

## 2016-03-30 NOTE — Progress Notes (Signed)
Subjective:    Patient ID: Justin Leonard, male    DOB: 1927/09/02, 82 y.o.   MRN: [id number]  Congestive Heart Failure Presents for follow-up visit. The disease course has been stable. Associated symptoms include edema, fatigue and shortness of breath. Pertinent negatives include no abdominal pain, chest pain, chest pressure, orthopnea or palpitations. The symptoms have been stable. Past treatments include beta blockers and salt and fluid restriction. The treatment provided moderate relief. Compliance with prior treatments has been good. His past medical history is significant for CAD, chronic lung disease, DM and HTN. There is no history of CVA. He has one 1st degree relative with heart disease.  Hypertension This is a chronic problem. The current episode started more than 1 year ago. The problem has been waxing and waning since onset. Associated symptoms include peripheral edema and shortness of breath. Pertinent negatives include no chest pain, headaches, neck pain or palpitations. There are no associated agents to hypertension. Risk factors for coronary artery disease include diabetes mellitus, family history, dyslipidemia and male gender. Past treatments include beta blockers. The current treatment provides moderate improvement. Compliance problems include exercise.  Hypertensive end-organ damage includes CAD/MI and heart failure.   Past Medical History  Diagnosis Date  . CHF (congestive heart failure) ([place])   . Hypertension   . Diabetes mellitus without complication ([place])   . Coronary artery disease   . Hyperlipidemia   . Deafness   . Peripheral neuropathy ([place])   . Back pain   . Umbilical hernia   . Depression   . Melena   . COPD (chronic obstructive pulmonary disease) ([place])   . Obesity   . Peripheral neuropathy ([name])     bil.hands and feet  . Skin cancer     Past Surgical History  Procedure Laterality Date  . Coronary artery bypass graft    . Cardiac catheterization Left  07/17/2015    Procedure: Right/Left Heart Cath and Coronary Angiography;  Surgeon: [name], MD;  Location: [facility];  Service: Cardiovascular;  Laterality: Left;  . Cardiac catheterization N/A 07/17/2015    Procedure: Coronary Stent Intervention;  Surgeon: [name], MD;  Location: [facility];  Service: Cardiovascular;  Laterality: N/A;  . Coronary angioplasty    . Esophagogastroduodenoscopy N/A 11/10/2015    Procedure: ESOPHAGOGASTRODUODENOSCOPY (EGD);  Surgeon: [name], MD;  Location: [facility] ENDOSCOPY;  Service: Endoscopy;  Laterality: N/A;  . Gall bladder drain    . Colonoscopy with propofol N/A 12/12/2015    Procedure: COLONOSCOPY WITH PROPOFOL;  Surgeon: [name], MD;  Location: [facility] ENDOSCOPY;  Service: Gastroenterology;  Laterality: N/A;    Family History  Problem Relation Age of Onset  . Anemia Neg Hx   . Arrhythmia Neg Hx   . Asthma Neg Hx   . Clotting disorder Neg Hx   . Fainting Neg Hx   . Heart attack Neg Hx   . Heart disease Neg Hx   . Heart failure Neg Hx   . Hyperlipidemia Neg Hx   . Hypertension Father   . CVA Father     Social History  Substance Use Topics  . Smoking status: Former Smoker -- 3.00 packs/day for 30 years    Types: Cigarettes    Quit date: 04/16/1978  . Smokeless tobacco: Never Used     Comment: Quit smoking 1979  . Alcohol Use: No    No Known Allergies  Prior to Admission medications   Medication Sig  Start Date End Date Taking? Authorizing Provider  acetaminophen (TYLENOL) 325 MG tablet Take 650 mg by mouth every 6 (six) hours as needed. Reported on 12/16/2015   Yes Historical Provider, MD  aspirin EC 81 MG tablet Take 81 mg by mouth daily.   Yes Historical Provider, MD  ferrous sulfate 325 (65 FE) MG tablet Take 325 mg by mouth daily with breakfast.   Yes Historical Provider, MD  FLUoxetine (PROZAC) 20 MG capsule Take 20 mg by mouth daily.   Yes Historical Provider, MD  furosemide (LASIX) 40 MG tablet Take  1 tablet (40 mg total) by mouth daily. Patient taking differently: Take 20 mg by mouth daily.  11/27/15  Yes Carrie Mew, MD  insulin glargine (LANTUS) 100 UNIT/ML injection Inject 10 Units into the skin at bedtime.   Yes Historical Provider, MD  lovastatin (MEVACOR) 20 MG tablet Take 20 mg by mouth at bedtime.   Yes Historical Provider, MD  metFORMIN (GLUCOPHAGE) 500 MG tablet Take 500 mg by mouth 3 (three) times daily.   Yes Historical Provider, MD  metoprolol tartrate (LOPRESSOR) 25 MG tablet Take 25 mg by mouth daily.   Yes Historical Provider, MD  nitroGLYCERIN (NITROSTAT) 0.4 MG SL tablet Place 1 tablet under the tongue every 5 (five) minutes as needed. Reported on 12/16/2015 07/15/15 07/14/16 Yes Historical Provider, MD  pantoprazole (PROTONIX) 40 MG tablet Take 1 tablet by mouth daily. 08/26/15  Yes Historical Provider, MD  senna-docusate (SENOKOT-S) 8.6-50 MG tablet Take 2 tablets by mouth 2 (two) times daily. 11/14/15  Yes Epifanio Lesches, MD  tamsulosin (FLOMAX) 0.4 MG CAPS capsule Take 1 capsule (0.4 mg total) by mouth daily after supper. 12/16/15  Yes Roda Shutters, FNP  Tiotropium Bromide Monohydrate (SPIRIVA RESPIMAT) 2.5 MCG/ACT AERS Inhale 2 puffs into the lungs daily.   Yes Historical Provider, MD  VENTOLIN HFA 108 (90 BASE) MCG/ACT inhaler Inhale 1-2 puffs into the lungs as needed. Reported on 12/16/2015 06/24/15   Historical Provider, MD      Review of Systems  Constitutional: Positive for fatigue. Negative for appetite change.  HENT: Positive for hearing loss. Negative for congestion, postnasal drip and sore throat.   Eyes: Negative.   Respiratory: Positive for shortness of breath. Negative for cough, chest tightness and wheezing.   Cardiovascular: Positive for leg swelling. Negative for chest pain and palpitations.  Gastrointestinal: Negative for abdominal pain and abdominal distention.  Endocrine: Negative.   Genitourinary: Negative.   Musculoskeletal: Negative  for back pain and neck pain.  Skin: Negative.   Allergic/Immunologic: Negative.   Neurological: Positive for dizziness. Negative for light-headedness and headaches.  Hematological: Negative for adenopathy. Bruises/bleeds easily.  Psychiatric/Behavioral: Negative for sleep disturbance and dysphoric mood. The patient is not nervous/anxious.        Objective:   Physical Exam  Constitutional: He is oriented to person, place, and time. He appears well-developed and well-nourished.  HENT:  Head: Normocephalic and atraumatic.  Eyes: Conjunctivae are normal. Pupils are equal, round, and reactive to light.  Neck: Normal range of motion. Neck supple.  Cardiovascular: Regular rhythm.  Bradycardia present.   Pulmonary/Chest: Effort normal. He has no wheezes. He has no rales.  Abdominal: Soft. He exhibits no distension. There is no tenderness.  Musculoskeletal: He exhibits edema (1+ pitting edema in bilateral lower legs). He exhibits no tenderness.  Neurological: He is alert and oriented to person, place, and time.  Skin: Skin is warm and dry.  Psychiatric: He has a normal mood and  affect. His behavior is normal. Thought content normal.  Nursing note and vitals reviewed.   BP 164/62 mmHg  Pulse 60  Resp 18  Ht 6\' 2"  (1.88 m)  Wt 223 lb (101.152 kg)  BMI 28.62 kg/m2  SpO2 97%       Assessment & Plan:  1: Chronic heart failure with preserved ejection fraction- Patient presents with fatigue and shortness of breath upon exertion. He says that he was tired and winded when he got to the office because he had to park far out and then had to walk into the building. Once he sat down for a few minutes, his symptoms improved. He has some chronic swelling in his lower legs but he doesn't feel like they are any worse and they do get better overnight. He continues to weigh himself at home and says that his weight has been stable. By our scale, he's only gained 1 pound since he was last here. Reminded to  call for an overnight weight gain of >2 pounds or a weekly weight gain of >5 pounds. He is not adding any salt to his food and his wife doesn't cook with salt either.  2: HTN- Blood pressure slightly elevated upon arrival to the office but he admits that he's irritated with the parking at this building. Continue to monitor. 3: COPD- Appears to be stable at this time without any wheezing. Continues with his inhalers. 4: Diabetes- He says that his glucose has been running a little high and this morning it was 145. He didn't bring his metformin bottle but he says that he is still taking that. Is following closely with his PCP regarding his diabetes.  Medication bottles were reviewed with the patient and his wife.  Return here in 6 months or sooner for any questions/problems before then.

## 2016-03-30 NOTE — Patient Instructions (Signed)
Continue weighing daily and call for an overnight weight gain of > 2 pounds or a weekly weight gain of >5 pounds. 

## 2016-05-09 ENCOUNTER — Emergency Department: Payer: Medicare Other

## 2016-05-09 ENCOUNTER — Encounter: Payer: Self-pay | Admitting: Emergency Medicine

## 2016-05-09 ENCOUNTER — Inpatient Hospital Stay
Admission: EM | Admit: 2016-05-09 | Discharge: 2016-05-12 | DRG: 281 | Disposition: A | Discharge status: Home / Self Care | Payer: Medicare Other | Attending: Internal Medicine | Admitting: Internal Medicine

## 2016-05-09 DIAGNOSIS — K8012 Calculus of gallbladder with acute and chronic cholecystitis without obstruction: Secondary | ICD-10-CM | POA: Diagnosis present

## 2016-05-09 DIAGNOSIS — H9190 Unspecified hearing loss, unspecified ear: Secondary | ICD-10-CM | POA: Diagnosis present

## 2016-05-09 DIAGNOSIS — Z8249 Family history of ischemic heart disease and other diseases of the circulatory system: Secondary | ICD-10-CM | POA: Diagnosis not present

## 2016-05-09 DIAGNOSIS — R1012 Left upper quadrant pain: Secondary | ICD-10-CM

## 2016-05-09 DIAGNOSIS — Z955 Presence of coronary angioplasty implant and graft: Secondary | ICD-10-CM | POA: Diagnosis not present

## 2016-05-09 DIAGNOSIS — I2510 Atherosclerotic heart disease of native coronary artery without angina pectoris: Secondary | ICD-10-CM | POA: Diagnosis present

## 2016-05-09 DIAGNOSIS — Z85828 Personal history of other malignant neoplasm of skin: Secondary | ICD-10-CM

## 2016-05-09 DIAGNOSIS — Z7982 Long term (current) use of aspirin: Secondary | ICD-10-CM

## 2016-05-09 DIAGNOSIS — Z823 Family history of stroke: Secondary | ICD-10-CM

## 2016-05-09 DIAGNOSIS — R109 Unspecified abdominal pain: Secondary | ICD-10-CM

## 2016-05-09 DIAGNOSIS — I5042 Chronic combined systolic (congestive) and diastolic (congestive) heart failure: Secondary | ICD-10-CM | POA: Diagnosis present

## 2016-05-09 DIAGNOSIS — R1010 Upper abdominal pain, unspecified: Secondary | ICD-10-CM

## 2016-05-09 DIAGNOSIS — E785 Hyperlipidemia, unspecified: Secondary | ICD-10-CM | POA: Diagnosis present

## 2016-05-09 DIAGNOSIS — I272 Other secondary pulmonary hypertension: Secondary | ICD-10-CM | POA: Diagnosis present

## 2016-05-09 DIAGNOSIS — Z951 Presence of aortocoronary bypass graft: Secondary | ICD-10-CM | POA: Diagnosis not present

## 2016-05-09 DIAGNOSIS — I110 Hypertensive heart disease with heart failure: Secondary | ICD-10-CM | POA: Diagnosis present

## 2016-05-09 DIAGNOSIS — Z794 Long term (current) use of insulin: Secondary | ICD-10-CM

## 2016-05-09 DIAGNOSIS — R1011 Right upper quadrant pain: Secondary | ICD-10-CM

## 2016-05-09 DIAGNOSIS — I248 Other forms of acute ischemic heart disease: Principal | ICD-10-CM | POA: Diagnosis present

## 2016-05-09 DIAGNOSIS — Z79899 Other long term (current) drug therapy: Secondary | ICD-10-CM | POA: Diagnosis not present

## 2016-05-09 DIAGNOSIS — R0600 Dyspnea, unspecified: Secondary | ICD-10-CM | POA: Diagnosis present

## 2016-05-09 DIAGNOSIS — K8000 Calculus of gallbladder with acute cholecystitis without obstruction: Secondary | ICD-10-CM | POA: Diagnosis not present

## 2016-05-09 DIAGNOSIS — Z87891 Personal history of nicotine dependence: Secondary | ICD-10-CM

## 2016-05-09 DIAGNOSIS — E1142 Type 2 diabetes mellitus with diabetic polyneuropathy: Secondary | ICD-10-CM | POA: Diagnosis present

## 2016-05-09 DIAGNOSIS — I209 Angina pectoris, unspecified: Secondary | ICD-10-CM

## 2016-05-09 DIAGNOSIS — I214 Non-ST elevation (NSTEMI) myocardial infarction: Secondary | ICD-10-CM

## 2016-05-09 DIAGNOSIS — E119 Type 2 diabetes mellitus without complications: Secondary | ICD-10-CM | POA: Diagnosis present

## 2016-05-09 DIAGNOSIS — J449 Chronic obstructive pulmonary disease, unspecified: Secondary | ICD-10-CM | POA: Diagnosis present

## 2016-05-09 LAB — CBC WITH DIFFERENTIAL/PLATELET
Basophils Absolute: 0 10*3/uL (ref 0–0.1)
Basophils Relative: 0 %
EOS ABS: 0 10*3/uL (ref 0–0.7)
Eosinophils Relative: 0 %
HCT: 32.1 % — ABNORMAL LOW (ref 40.0–52.0)
HEMOGLOBIN: 10.6 g/dL — AB (ref 13.0–18.0)
LYMPHS ABS: 0.4 10*3/uL — AB (ref 1.0–3.6)
Lymphocytes Relative: 3 %
MCH: 29.4 pg (ref 26.0–34.0)
MCHC: 33.1 g/dL (ref 32.0–36.0)
MCV: 88.9 fL (ref 80.0–100.0)
Monocytes Absolute: 0.6 10*3/uL (ref 0.2–1.0)
Neutro Abs: 12.4 10*3/uL — ABNORMAL HIGH (ref 1.4–6.5)
Platelets: 144 10*3/uL — ABNORMAL LOW (ref 150–440)
RBC: 3.61 MIL/uL — ABNORMAL LOW (ref 4.40–5.90)
RDW: 13.8 % (ref 11.5–14.5)
WBC: 13.4 10*3/uL — ABNORMAL HIGH (ref 3.8–10.6)

## 2016-05-09 LAB — URINALYSIS COMPLETE WITH MICROSCOPIC (ARMC ONLY)
Bilirubin Urine: NEGATIVE
Glucose, UA: NEGATIVE mg/dL
HGB URINE DIPSTICK: NEGATIVE
KETONES UR: NEGATIVE mg/dL
LEUKOCYTES UA: NEGATIVE
NITRITE: NEGATIVE
PH: 5 (ref 5.0–8.0)
PROTEIN: 100 mg/dL — AB
SPECIFIC GRAVITY, URINE: 1.023 (ref 1.005–1.030)

## 2016-05-09 LAB — COMPREHENSIVE METABOLIC PANEL
ALT: 17 U/L (ref 17–63)
ANION GAP: 9 (ref 5–15)
AST: 29 U/L (ref 15–41)
Albumin: 3.7 g/dL (ref 3.5–5.0)
Alkaline Phosphatase: 56 U/L (ref 38–126)
BUN: 25 mg/dL — ABNORMAL HIGH (ref 6–20)
CHLORIDE: 103 mmol/L (ref 101–111)
CO2: 24 mmol/L (ref 22–32)
CREATININE: 1.37 mg/dL — AB (ref 0.61–1.24)
Calcium: 9.2 mg/dL (ref 8.9–10.3)
GFR, EST AFRICAN AMERICAN: 52 mL/min — AB (ref >60)
GFR, EST NON AFRICAN AMERICAN: 45 mL/min — AB (ref >60)
Glucose, Bld: 175 mg/dL — ABNORMAL HIGH (ref 65–99)
POTASSIUM: 3.7 mmol/L (ref 3.5–5.1)
SODIUM: 136 mmol/L (ref 135–145)
Total Bilirubin: 1.2 mg/dL (ref 0.3–1.2)
Total Protein: 6.1 g/dL — ABNORMAL LOW (ref 6.5–8.1)

## 2016-05-09 LAB — GLUCOSE, CAPILLARY
GLUCOSE-CAPILLARY: 128 mg/dL — AB (ref 65–99)
Glucose-Capillary: 136 mg/dL — ABNORMAL HIGH (ref 65–99)

## 2016-05-09 LAB — HEPARIN LEVEL (UNFRACTIONATED): Heparin Unfractionated: 0.3 IU/mL (ref 0.30–0.70)

## 2016-05-09 LAB — LACTIC ACID, PLASMA
Lactic Acid, Venous: 1.8 mmol/L (ref 0.5–2.0)
Lactic Acid, Venous: 1.3 mmol/L (ref 0.5–2.0)

## 2016-05-09 LAB — PROTIME-INR
INR: 1.16
Prothrombin Time: 15 seconds (ref 11.4–15.0)

## 2016-05-09 LAB — TROPONIN I
TROPONIN I: 1.99 ng/mL — AB (ref <0.031)
TROPONIN I: 2.86 ng/mL — AB (ref <0.031)
Troponin I: 2.57 ng/mL — ABNORMAL HIGH (ref <0.031)

## 2016-05-09 LAB — APTT: APTT: 31 s (ref 24–36)

## 2016-05-09 LAB — LIPASE, BLOOD: LIPASE: 19 U/L (ref 11–51)

## 2016-05-09 MED ORDER — FLUOXETINE HCL 20 MG PO CAPS
20.0000 mg | ORAL_CAPSULE | Freq: Every day | ORAL | Status: DC
  Administered 2016-05-09 – 2016-05-12 (×4): 20 mg via ORAL
  Filled 2016-05-09 (×4): qty 1

## 2016-05-09 MED ORDER — BISACODYL 10 MG RE SUPP: 10.0000 mg | Freq: Every day | RECTAL | Status: DC | PRN

## 2016-05-09 MED ORDER — FAMOTIDINE IN NACL 20-0.9 MG/50ML-% IV SOLN
INTRAVENOUS | Status: AC
  Administered 2016-05-09: 20 mg via INTRAVENOUS
  Filled 2016-05-09: qty 50

## 2016-05-09 MED ORDER — IPRATROPIUM-ALBUTEROL 0.5-2.5 (3) MG/3ML IN SOLN
3.0000 mL | Freq: Four times a day (QID) | RESPIRATORY_TRACT | Status: DC
  Administered 2016-05-09 – 2016-05-10 (×4): 3 mL via RESPIRATORY_TRACT
  Filled 2016-05-09 (×5): qty 3

## 2016-05-09 MED ORDER — ASPIRIN 81 MG PO CHEW
324.0000 mg | CHEWABLE_TABLET | Freq: Once | ORAL | Status: AC
  Administered 2016-05-09: 324 mg via ORAL
  Filled 2016-05-09: qty 4

## 2016-05-09 MED ORDER — PANTOPRAZOLE SODIUM 40 MG PO TBEC
40.0000 mg | DELAYED_RELEASE_TABLET | Freq: Every day | ORAL | Status: DC
  Administered 2016-05-09 – 2016-05-12 (×4): 40 mg via ORAL
  Filled 2016-05-09 (×4): qty 1

## 2016-05-09 MED ORDER — INSULIN ASPART 100 UNIT/ML ~~LOC~~ SOLN
0.0000 [IU] | Freq: Every day | SUBCUTANEOUS | Status: DC
Start: 2016-05-09 — End: 2016-05-12

## 2016-05-09 MED ORDER — INSULIN GLARGINE 100 UNIT/ML ~~LOC~~ SOLN
12.0000 [IU] | Freq: Every day | SUBCUTANEOUS | Status: DC
  Administered 2016-05-09 – 2016-05-11 (×3): 12 [IU] via SUBCUTANEOUS
  Filled 2016-05-09 (×4): qty 0.12

## 2016-05-09 MED ORDER — DOCUSATE SODIUM 100 MG PO CAPS
100.0000 mg | ORAL_CAPSULE | Freq: Two times a day (BID) | ORAL | Status: DC
Start: 2016-05-09 — End: 2016-05-12
  Administered 2016-05-09 – 2016-05-12 (×5): 100 mg via ORAL
  Filled 2016-05-09 (×6): qty 1

## 2016-05-09 MED ORDER — ONDANSETRON HCL 4 MG/2ML IJ SOLN
4.0000 mg | Freq: Four times a day (QID) | INTRAMUSCULAR | Status: DC | PRN
  Administered 2016-05-09: 4 mg via INTRAVENOUS
  Filled 2016-05-09: qty 2

## 2016-05-09 MED ORDER — ONDANSETRON HCL 4 MG PO TABS: 4.0000 mg | ORAL_TABLET | Freq: Four times a day (QID) | ORAL | Status: DC | PRN

## 2016-05-09 MED ORDER — HEPARIN BOLUS VIA INFUSION
4000.0000 [IU] | Freq: Once | INTRAVENOUS | Status: AC
  Administered 2016-05-09: 4000 [IU] via INTRAVENOUS
  Filled 2016-05-09: qty 4000

## 2016-05-09 MED ORDER — FAMOTIDINE IN NACL 20-0.9 MG/50ML-% IV SOLN
20.0000 mg | Freq: Two times a day (BID) | INTRAVENOUS | Status: DC
  Administered 2016-05-09 – 2016-05-12 (×7): 20 mg via INTRAVENOUS
  Filled 2016-05-09 (×8): qty 50

## 2016-05-09 MED ORDER — FUROSEMIDE 40 MG PO TABS
40.0000 mg | ORAL_TABLET | Freq: Every day | ORAL | Status: DC
  Administered 2016-05-09 – 2016-05-12 (×3): 40 mg via ORAL
  Filled 2016-05-09 (×3): qty 1

## 2016-05-09 MED ORDER — ASPIRIN EC 81 MG PO TBEC
81.0000 mg | DELAYED_RELEASE_TABLET | Freq: Every day | ORAL | Status: DC
  Administered 2016-05-10 – 2016-05-12 (×3): 81 mg via ORAL
  Filled 2016-05-09 (×3): qty 1

## 2016-05-09 MED ORDER — ALBUTEROL SULFATE (2.5 MG/3ML) 0.083% IN NEBU: 2.5000 mg | INHALATION_SOLUTION | RESPIRATORY_TRACT | Status: DC | PRN

## 2016-05-09 MED ORDER — HEPARIN (PORCINE) IN NACL 100-0.45 UNIT/ML-% IJ SOLN
1300.0000 [IU]/h | INTRAMUSCULAR | Status: DC
  Administered 2016-05-09 – 2016-05-10 (×2): 1300 [IU]/h via INTRAVENOUS
  Filled 2016-05-09 (×4): qty 250

## 2016-05-09 MED ORDER — ACETAMINOPHEN 650 MG RE SUPP: 650.0000 mg | Freq: Four times a day (QID) | RECTAL | Status: DC | PRN

## 2016-05-09 MED ORDER — SODIUM CHLORIDE 0.9% FLUSH
3.0000 mL | Freq: Two times a day (BID) | INTRAVENOUS | Status: DC
  Administered 2016-05-09 – 2016-05-11 (×4): 3 mL via INTRAVENOUS

## 2016-05-09 MED ORDER — NITROGLYCERIN 0.4 MG SL SUBL: 0.4000 mg | SUBLINGUAL_TABLET | SUBLINGUAL | Status: DC | PRN

## 2016-05-09 MED ORDER — INSULIN ASPART 100 UNIT/ML ~~LOC~~ SOLN
0.0000 [IU] | Freq: Three times a day (TID) | SUBCUTANEOUS | Status: DC
  Administered 2016-05-09: 1 [IU] via SUBCUTANEOUS
  Administered 2016-05-10: 2 [IU] via SUBCUTANEOUS
  Administered 2016-05-11 – 2016-05-12 (×4): 1 [IU] via SUBCUTANEOUS
  Filled 2016-05-09 (×3): qty 1
  Filled 2016-05-09: qty 2
  Filled 2016-05-09 (×2): qty 1

## 2016-05-09 MED ORDER — FERROUS SULFATE 325 (65 FE) MG PO TABS
325.0000 mg | ORAL_TABLET | Freq: Every day | ORAL | Status: DC
  Administered 2016-05-10 – 2016-05-12 (×3): 325 mg via ORAL
  Filled 2016-05-09 (×3): qty 1

## 2016-05-09 MED ORDER — SENNOSIDES-DOCUSATE SODIUM 8.6-50 MG PO TABS
2.0000 | ORAL_TABLET | Freq: Two times a day (BID) | ORAL | Status: DC
  Administered 2016-05-09 – 2016-05-12 (×5): 2 via ORAL
  Filled 2016-05-09 (×5): qty 2

## 2016-05-09 MED ORDER — TAMSULOSIN HCL 0.4 MG PO CAPS
0.4000 mg | ORAL_CAPSULE | Freq: Every day | ORAL | Status: DC
  Administered 2016-05-09 – 2016-05-11 (×3): 0.4 mg via ORAL
  Filled 2016-05-09 (×3): qty 1

## 2016-05-09 MED ORDER — ACETAMINOPHEN 325 MG PO TABS: 650.0000 mg | ORAL_TABLET | Freq: Four times a day (QID) | ORAL | Status: DC | PRN

## 2016-05-09 MED ORDER — MORPHINE SULFATE (PF) 2 MG/ML IV SOLN: 2.0000 mg | INTRAVENOUS | Status: DC | PRN

## 2016-05-09 MED ORDER — METOPROLOL SUCCINATE ER 25 MG PO TB24
25.0000 mg | ORAL_TABLET | Freq: Every day | ORAL | Status: DC
  Administered 2016-05-09 – 2016-05-12 (×3): 25 mg via ORAL
  Filled 2016-05-09 (×3): qty 1

## 2016-05-09 MED ORDER — SODIUM CHLORIDE 0.9 % IV BOLUS (SEPSIS)
1000.0000 mL | Freq: Once | INTRAVENOUS | Status: AC
  Administered 2016-05-09: 1000 mL via INTRAVENOUS

## 2016-05-09 MED ORDER — LEVOFLOXACIN IN D5W 750 MG/150ML IV SOLN
750.0000 mg | Freq: Once | INTRAVENOUS | Status: AC
  Administered 2016-05-09: 750 mg via INTRAVENOUS
  Filled 2016-05-09: qty 150

## 2016-05-09 MED ORDER — PRAVASTATIN SODIUM 20 MG PO TABS
20.0000 mg | ORAL_TABLET | Freq: Every day | ORAL | Status: DC
  Administered 2016-05-09 – 2016-05-11 (×3): 20 mg via ORAL
  Filled 2016-05-09 (×3): qty 1

## 2016-05-09 MED ORDER — TIOTROPIUM BROMIDE MONOHYDRATE 18 MCG IN CAPS
18.0000 ug | ORAL_CAPSULE | Freq: Every day | RESPIRATORY_TRACT | Status: DC
  Administered 2016-05-09 – 2016-05-12 (×4): 18 ug via RESPIRATORY_TRACT
  Filled 2016-05-09: qty 5

## 2016-05-09 NOTE — Consult Note (Signed)
Justin Leonard is a 82 y.o. male  admitted with abdominal and chest pain.  HPI: He has a complicated medical history. He has long-standing history of severe coronary artery disease with previous stent placement. He is also had a coronary artery bypass procedure in the past. He is a long-term cigarette smoker and has had multiple problems with congestive heart failure. He was admitted in the spring of 2017 with acute cholecystitis. Because of his multiple medical problems he underwent a percutaneous drainage at that time and his symptoms resolved. He was seen in the office several weeks later where the cholecystostomy tube was removed and at that time surgery was not recommended due to his general medical conditions. He did well for the next several months.  About a week ago he had fairly significant chest pain while waiting on family and a parked car. He took some nitroglycerin and feels like the medication did assist in relieving his discomfort. He is also had episodes of indigestion and mild nausea over the last several weeks. The last 24 hours he's had significant increase in upper abdominal pain. He is also had several shaking chills and profound fevers.  He presented the emergency room for further evaluation. He was noted to have an elevation in his troponin at 2.8. Justin blood cell count at 13,000 with 93% neutrophils. Ultrasound revealed some thickening of his gallbladder wall and some persistent stones. The patient was admitted with a non-ST segment elevation myocardial infarction. The surgical service was consulted.  In talking with his family he has had several shaking chills over the last 24 hours followed by marked temperature within 30 minutes. He's also developed some significant sweats. He's been vomiting profusely. He denies any significant abdominal pain. Past Medical History  Diagnosis Date  . CHF (congestive heart failure) ([place])   . Hypertension   . Diabetes mellitus without  complication ([place])   . Coronary artery disease   . Hyperlipidemia   . Deafness   . Peripheral neuropathy ([place])   . Back pain   . Umbilical hernia   . Depression   . Melena   . COPD (chronic obstructive pulmonary disease) ([place])   . Obesity   . Peripheral neuropathy ([name])     bil.hands and feet  . Skin cancer    Past Surgical History  Procedure Laterality Date  . Coronary artery bypass graft    . Cardiac catheterization Left 07/17/2015    Procedure: Right/Left Heart Cath and Coronary Angiography;  Surgeon: [name], MD;  Location: [place] [facility];  Service: Cardiovascular;  Laterality: Left;  . Cardiac catheterization N/A 07/17/2015    Procedure: Coronary Stent Intervention;  Surgeon: [name], MD;  Location: [place] [facility];  Service: Cardiovascular;  Laterality: N/A;  . Coronary angioplasty    . Esophagogastroduodenoscopy N/A 11/10/2015    Procedure: ESOPHAGOGASTRODUODENOSCOPY (EGD);  Surgeon: [name], MD;  Location: [facility] ENDOSCOPY;  Service: Endoscopy;  Laterality: N/A;  . Gall bladder drain    . Colonoscopy with propofol N/A 12/12/2015    Procedure: COLONOSCOPY WITH PROPOFOL;  Surgeon: [name], MD;  Location: [facility] ENDOSCOPY;  Service: Gastroenterology;  Laterality: N/A;   Social History   Social History  . Marital Status: Married    Spouse Name: N/A  . Number of Children: N/A  . Years of Education: N/A   Social History Main Topics  . Smoking status: Former Smoker -- 3.00 packs/day for 30 years    Types: Cigarettes  Quit date: 04/16/1978  . Smokeless tobacco: Never Used     Comment: Quit smoking 1979  . Alcohol Use: No  . Drug Use: No  . Sexual Activity: Not Currently   Other Topics Concern  . None   Social History Narrative    Review of Systems: Review of Systems  Constitutional: Positive for fever, chills, malaise/fatigue and diaphoresis. Negative for weight loss.  HENT: Negative.   Eyes: Negative.   Respiratory: Positive for  shortness of breath. Negative for cough and wheezing.   Cardiovascular: Positive for chest pain. Negative for palpitations and orthopnea.  Gastrointestinal: Positive for heartburn, nausea, vomiting, abdominal pain and constipation. Negative for diarrhea.  Genitourinary: Negative.   Musculoskeletal: Negative.   Skin: Negative.   Neurological: Negative.   Psychiatric/Behavioral: Negative.     PHYSICAL EXAM: BP 140/60 mmHg  Pulse 101  Temp(Src) 98.2 F (36.8 C) (Oral)  Resp 18  Ht 6\' 2"  (1.88 m)  Wt 101.606 kg (224 lb)  BMI 28.75 kg/m2  SpO2 94%  Physical Exam  Constitutional: He is oriented to person, place, and time. He appears well-developed.  HENT:  Head: Normocephalic and atraumatic.  Eyes: EOM are normal. Pupils are equal, round, and reactive to light.  Neck: Normal range of motion. Neck supple.  Cardiovascular: Regular rhythm and normal heart sounds.   Pulmonary/Chest: Effort normal and breath sounds normal.  Abdominal: Soft. Bowel sounds are normal. He exhibits no distension. There is tenderness. There is no rebound and no guarding.  Musculoskeletal: Normal range of motion. He exhibits no edema or tenderness.  Neurological: He is alert and oriented to person, place, and time.  Skin: Skin is warm. He is diaphoretic.  Psychiatric: His behavior is normal. Thought content normal.   Abdomen is nondistended with some mild midepigastric tenderness. He does not have any point tenderness. His cholecystostomy scar is well-healed. He does have active bowel sounds. He does not have any rebound or guarding.  Impression/Plan: This gentleman is admitted with a possible myocardial infarction based on his EKG and troponin levels. However, with his significant chill and fevers I'm concerned that he may have bacteremia from his gallbladder once again. I would recommend antibiotic therapy as we workup is hard to make a decision with regard to whether he does have an acute cardiac problem  [place] troponin elevation is due to his infection.  CT scan may be of some benefit to better evaluate his gallbladder. We may have to discuss the possibility of requiring gallbladder surgery and his heart appears to be satisfactorily stable. I'll talk with the family his wife and sons at length about the issues and they are in agreement with this plan. We will continue to follow him while he is hospitalized.   Dia Crawford III, MD  05/09/2016, 5:41 PM

## 2016-05-09 NOTE — ED Provider Notes (Signed)
[facility] Emergency Department Provider Note  ____________________________________________  Time seen: 10:45 AM  I have reviewed the triage vital signs and the nursing notes.   HISTORY  Chief Complaint Nausea and Emesis    HPI Justin Leonard is a 82 y.o. male who complains of shortness of breath, worse with exertion. No chest pain. No fevers chills or sweats, but has had some nausea and vomiting starting yesterday. Has a history of cholecystitis that was managed with percutaneous drain due to a concurrent heart attack. He has not had a cholecystectomy at. No dizziness or syncope.     Past Medical History  Diagnosis Date  . CHF (congestive heart failure) ([place])   . Hypertension   . Diabetes mellitus without complication ([place])   . Coronary artery disease   . Hyperlipidemia   . Deafness   . Peripheral neuropathy ([place])   . Back pain   . Umbilical hernia   . Depression   . Melena   . COPD (chronic obstructive pulmonary disease) ([place])   . Obesity   . Peripheral neuropathy ([name])     bil.hands and feet  . Skin cancer      Patient Active Problem List   Diagnosis Date Noted  . Bleeding gastrointestinal   . Diabetic hypoglycemia ([place]) 10/13/2015  . Bradycardia 07/30/2015  . Elevated troponin 07/15/2015  . Chronic diastolic heart failure ([place]) 04/17/2015  . HTN (hypertension) 04/17/2015  . Diabetes ([place]) 04/17/2015  . COPD (chronic obstructive pulmonary disease) ([place]) 04/17/2015  . Clinical depression 08/23/2012  . Melanoma in situ ([place]) 08/23/2012  . Exomphalos 08/23/2012  . Major depressive disorder with single episode ([place]) 08/23/2012  . Difficulty hearing 01/18/2012  . HLD (hyperlipidemia) 01/18/2012  . Peripheral nerve disease ([place]) 01/18/2012     Past Surgical History  Procedure Laterality Date  . Coronary artery bypass graft    . Cardiac catheterization Left 07/17/2015    Procedure: Right/Left Heart Cath and Coronary Angiography;   Surgeon: [name], MD;  Location: [place] [facility];  Service: Cardiovascular;  Laterality: Left;  . Cardiac catheterization N/A 07/17/2015    Procedure: Coronary Stent Intervention;  Surgeon: [name], MD;  Location: [place] [facility];  Service: Cardiovascular;  Laterality: N/A;  . Coronary angioplasty    . Esophagogastroduodenoscopy N/A 11/10/2015    Procedure: ESOPHAGOGASTRODUODENOSCOPY (EGD);  Surgeon: [name], MD;  Location: [facility] ENDOSCOPY;  Service: Endoscopy;  Laterality: N/A;  . Gall bladder drain    . Colonoscopy with propofol N/A 12/12/2015    Procedure: COLONOSCOPY WITH PROPOFOL;  Surgeon: [name], MD;  Location: [facility] ENDOSCOPY;  Service: Gastroenterology;  Laterality: N/A;     Current Outpatient Rx  Name  Route  Sig  Dispense  Refill  . acetaminophen (TYLENOL) 325 MG tablet   Oral   Take 650 mg by mouth every 6 (six) hours as needed. Reported on 12/16/2015         . aspirin EC 81 MG tablet   Oral   Take 81 mg by mouth daily.         . ferrous sulfate 325 (65 FE) MG tablet   Oral   Take 325 mg by mouth daily with breakfast.         . FLUoxetine (PROZAC) 20 MG capsule   Oral   Take 20 mg by mouth daily.         . furosemide (LASIX) 40 MG tablet   Oral   Take  1 tablet (40 mg total) by mouth daily. Patient taking differently: Take 20 mg by mouth daily.    10 tablet   0   . insulin glargine (LANTUS) 100 UNIT/ML injection   Subcutaneous   Inject 10 Units into the skin at bedtime.         . lovastatin (MEVACOR) 20 MG tablet   Oral   Take 20 mg by mouth at bedtime.         . metFORMIN (GLUCOPHAGE) 500 MG tablet   Oral   Take 500 mg by mouth 3 (three) times daily.         . metoprolol tartrate (LOPRESSOR) 25 MG tablet   Oral   Take 25 mg by mouth daily.         . nitroGLYCERIN (NITROSTAT) 0.4 MG SL tablet   Sublingual   Place 1 tablet under the tongue every 5 (five) minutes as needed. Reported on 12/16/2015         .  pantoprazole (PROTONIX) 40 MG tablet   Oral   Take 1 tablet by mouth daily.         Marland Kitchen senna-docusate (SENOKOT-S) 8.6-50 MG tablet   Oral   Take 2 tablets by mouth 2 (two) times daily.   30 tablet   0   . tamsulosin (FLOMAX) 0.4 MG CAPS capsule   Oral   Take 1 capsule (0.4 mg total) by mouth daily after supper.   30 capsule   12   . Tiotropium Bromide Monohydrate (SPIRIVA RESPIMAT) 2.5 MCG/ACT AERS   Inhalation   Inhale 2 puffs into the lungs daily.         . VENTOLIN HFA 108 (90 BASE) MCG/ACT inhaler   Inhalation   Inhale 1-2 puffs into the lungs as needed. Reported on 12/16/2015           Dispense as written.      Allergies Review of patient's allergies indicates no known allergies.   Family History  Problem Relation Age of Onset  . Anemia Neg Hx   . Arrhythmia Neg Hx   . Asthma Neg Hx   . Clotting disorder Neg Hx   . Fainting Neg Hx   . Heart attack Neg Hx   . Heart disease Neg Hx   . Heart failure Neg Hx   . Hyperlipidemia Neg Hx   . Hypertension Father   . CVA Father     Social History Social History  Substance Use Topics  . Smoking status: Former Smoker -- 3.00 packs/day for 30 years    Types: Cigarettes    Quit date: 04/16/1978  . Smokeless tobacco: Never Used     Comment: Quit smoking 1979  . Alcohol Use: No    Review of Systems  Constitutional:   No fever or chills.  Eyes:   No vision changes.  ENT:   No sore throat. No rhinorrhea. Cardiovascular:   No chest pain. Respiratory:   Positive shortness of breath and nonproductive cough Gastrointestinal:   Negative for abdominal pain, vomiting and diarrhea.  Genitourinary:   Negative for dysuria or difficulty urinating. Musculoskeletal:   Negative for focal pain or swelling Neurological:   Negative for headaches 10-point ROS otherwise negative.  ____________________________________________   PHYSICAL EXAM:  VITAL SIGNS: ED Triage Vitals  Enc Vitals Group     BP 05/09/16 1048  102/54 mmHg     Pulse Rate 05/09/16 1048 79     Resp 05/09/16 1048 19  Temp 05/09/16 1048 98.2 F (36.8 C)     Temp Source 05/09/16 1048 Oral     SpO2 05/09/16 1048 94 %     Weight 05/09/16 1053 215 lb (97.523 kg)     Height 05/09/16 1053 6\' 2"  (1.88 m)     Head Cir --      Peak Flow --      Pain Score --      Pain Loc --      Pain Edu? --      Excl. in [place]? --    Room air oxygen saturation 90% Vital signs reviewed, nursing assessments reviewed.   Constitutional:   Alert and oriented. Ill-appearing. Eyes:   No scleral icterus. No conjunctival pallor. PERRL. EOMI.  No nystagmus. ENT   Head:   Normocephalic and atraumatic.   Nose:   No congestion/rhinnorhea. No septal hematoma   Mouth/Throat:   Dry mucous membranes, no pharyngeal erythema. No peritonsillar mass.    Neck:   No stridor. No SubQ emphysema. No meningismus. Hematological/Lymphatic/Immunilogical:   No cervical lymphadenopathy. Cardiovascular:   RRR. Symmetric bilateral radial and DP pulses.  No murmurs.  Respiratory:   Normal respiratory effort without tachypnea nor retractions. Breath sounds are clear and equal bilaterally. No wheezes/rales/rhonchi. Gastrointestinal:   Soft with right upper quadrant tenderness. Non distended. There is no CVA tenderness.  No rebound, rigidity, or guarding. Genitourinary:   deferred Musculoskeletal:   Nontender with normal range of motion in all extremities. No joint effusions.  No lower extremity tenderness.  No edema. Neurologic:   Normal speech and language.  CN 2-10 normal. Motor grossly intact. No gross focal neurologic deficits are appreciated.  Skin:    Skin is warm, dry and intact. No rash noted.  No petechiae, purpura, or bullae.  ____________________________________________    LABS (pertinent positives/negatives) (all labs ordered are listed, but only abnormal results are displayed) Labs Reviewed  COMPREHENSIVE METABOLIC PANEL - Abnormal; Notable for the  following:    Glucose, Bld 175 (*)    BUN 25 (*)    Creatinine, Ser 1.37 (*)    Total Protein 6.1 (*)    GFR calc non Af Amer 45 (*)    GFR calc Af Amer 52 (*)    All other components within normal limits  TROPONIN I - Abnormal; Notable for the following:    Troponin I 1.99 (*)    All other components within normal limits  CBC WITH DIFFERENTIAL/PLATELET - Abnormal; Notable for the following:    WBC 13.4 (*)    RBC 3.61 (*)    Hemoglobin 10.6 (*)    HCT 32.1 (*)    Platelets 144 (*)    Neutro Abs 12.4 (*)    Lymphs Abs 0.4 (*)    All other components within normal limits  CULTURE, EXPECTORATED SPUTUM-ASSESSMENT  URINE CULTURE  CULTURE, BLOOD (SINGLE)  LACTIC ACID, PLASMA  LIPASE, BLOOD  APTT  PROTIME-INR  LACTIC ACID, PLASMA  URINALYSIS COMPLETEWITH MICROSCOPIC ([facility] ONLY)   ____________________________________________   EKG  Interpreted by me Normal sinus rhythm rate of 78, right axis, normal intervals. Right bundle branch block. Normal ST segments and T waves. Inferior Q waves.  ____________________________________________    RADIOLOGY  Chest x-ray unremarkable Ultrasound right upper quadrant shows cholelithiasis and an irregularly thickened gallbladder wall. Possibly chronic cholecystitis but not consistent with acute cholecystitis.  ____________________________________________   PROCEDURES CRITICAL CARE Performed by: Joni Fears, Herald Vallin   Total critical care time: 35 minutes  Critical care  time was exclusive of separately billable procedures and treating other patients.  Critical care was necessary to treat or prevent imminent or life-threatening deterioration.  Critical care was time spent personally by me on the following activities: development of treatment plan with patient and/or surrogate as well as nursing, discussions with consultants, evaluation of patient's response to treatment, examination of patient, obtaining history from patient or  surrogate, ordering and performing treatments and interventions, ordering and review of laboratory studies, ordering and review of radiographic studies, pulse oximetry and re-evaluation of patient's condition.   ____________________________________________   INITIAL IMPRESSION / ASSESSMENT AND PLAN / ED COURSE  Pertinent labs & imaging results that were available during my care of the patient were reviewed by me and considered in my medical decision making (see chart for details).  Patient presents with dyspnea worse with exertion. Has multiple comorbidities and cardiac risk factors. Initially concern for pneumonia and started on Levaquin with a saline bolus. However, chest x-ray does not show an obvious infiltrate, and subsequent lab workup revealed a troponin of 1.99. I discussed the case with Dr. Ubaldo Glassing who previously evaluated the patient with a plan for admission and anticoagulation with aspirin and heparin, to which he agreed. I discussed the case with the hospitalist for further management.  EMS had reported a single blood pressure measurement with a systolic in the [id number], but he has not had any further measurements below 90, none in the emergency department. according to the [place] code sepsis protocol, he does not meet the criteria for severe sepsis or septic shock.  At this point I do not suspect sepsis and will manage the patient primarily for his N STEMI.     ____________________________________________   FINAL CLINICAL IMPRESSION(S) / ED DIAGNOSES  Final diagnoses:  NSTEMI (non-ST elevated myocardial infarction) ([place])  Dyspnea       Portions of this note were generated with dragon dictation software. Dictation errors may occur despite best attempts at proofreading.   Carrie Mew, MD 05/09/16 1255

## 2016-05-09 NOTE — H&P (Signed)
History and Physical    Justin Leonard [id number] DOB: January 10, 1927 DOA: 05/09/2016  Referring physician: Dr. Joni Fears PCP: [name], MD  Specialists: Dr. Chancy Milroy  Chief Complaint: CP with SOB and nausea  HPI: Justin Leonard is a 82 y.o. male has a past medical history significant for ASCVD s/p CABG with subsequent stent placements now with recurrent episodes of lower chest and upper abdominal pain with worsening SOB and nausea. No vomiting or diarrhea. Had recent gallbladder issues. Presented to ER where EKG showed no acute chanbges but troponin elevated c/w NSTEMI. He is now admitted. No fever.  Review of Systems: The patient denies anorexia, fever, weight loss,, vision loss, decreased hearing, hoarseness, syncope, , peripheral edema, balance deficits, hemoptysis,, melena, hematochezia, severe indigestion/heartburn, hematuria, incontinence, genital sores, muscle weakness, suspicious skin lesions, transient blindness, difficulty walking, depression, unusual weight change, abnormal bleeding, enlarged lymph nodes, angioedema, and breast masses.   Past Medical History  Diagnosis Date  . CHF (congestive heart failure) ([place])   . Hypertension   . Diabetes mellitus without complication ([place])   . Coronary artery disease   . Hyperlipidemia   . Deafness   . Peripheral neuropathy ([place])   . Back pain   . Umbilical hernia   . Depression   . Melena   . COPD (chronic obstructive pulmonary disease) ([place])   . Obesity   . Peripheral neuropathy ([name])     bil.hands and feet  . Skin cancer    Past Surgical History  Procedure Laterality Date  . Coronary artery bypass graft    . Cardiac catheterization Left 07/17/2015    Procedure: Right/Left Heart Cath and Coronary Angiography;  Surgeon: [name], MD;  Location: [facility];  Service: Cardiovascular;  Laterality: Left;  . Cardiac catheterization N/A 07/17/2015    Procedure: Coronary Stent Intervention;  Surgeon:  [name], MD;  Location: [facility];  Service: Cardiovascular;  Laterality: N/A;  . Coronary angioplasty    . Esophagogastroduodenoscopy N/A 11/10/2015    Procedure: ESOPHAGOGASTRODUODENOSCOPY (EGD);  Surgeon: [name], MD;  Location: [facility];  Service: Endoscopy;  Laterality: N/A;  . Gall bladder drain    . Colonoscopy with propofol N/A 12/12/2015    Procedure: COLONOSCOPY WITH PROPOFOL;  Surgeon: [name], MD;  Location: [facility] ENDOSCOPY;  Service: Gastroenterology;  Laterality: N/A;   Social History:  reports that he quit smoking about 38 years ago. His smoking use included Cigarettes. He has a 90 pack-year smoking history. He has never used smokeless tobacco. He reports that he does not drink alcohol or use illicit drugs.  No Known Allergies  Family History  Problem Relation Age of Onset  . Anemia Neg Hx   . Arrhythmia Neg Hx   . Asthma Neg Hx   . Clotting disorder Neg Hx   . Fainting Neg Hx   . Heart attack Neg Hx   . Heart disease Neg Hx   . Heart failure Neg Hx   . Hyperlipidemia Neg Hx   . Hypertension Father   . CVA Father     Prior to Admission medications   Medication Sig Start Date End Date Taking? Authorizing Provider  acetaminophen (TYLENOL) 325 MG tablet Take 650 mg by mouth every 6 (six) hours as needed. Reported on 12/16/2015   Yes Historical Provider, MD  aspirin EC 81 MG tablet Take 81 mg by mouth daily.   Yes Historical Provider, MD  ferrous sulfate 325 (65 FE)  MG tablet Take 325 mg by mouth daily with breakfast.   Yes Historical Provider, MD  FLUoxetine (PROZAC) 20 MG capsule Take 20 mg by mouth daily.   Yes Historical Provider, MD  furosemide (LASIX) 20 MG tablet Take 20 mg by mouth daily.   Yes Historical Provider, MD  insulin glargine (LANTUS) 100 UNIT/ML injection Inject 12 Units into the skin at bedtime.    Yes Historical Provider, MD  lovastatin (MEVACOR) 20 MG tablet Take 20 mg by mouth at bedtime.   Yes Historical Provider, MD   metFORMIN (GLUCOPHAGE) 500 MG tablet Take 500 mg by mouth 3 (three) times daily.   Yes Historical Provider, MD  metoprolol succinate (TOPROL-XL) 25 MG 24 hr tablet Take 25 mg by mouth daily.   Yes Historical Provider, MD  nitroGLYCERIN (NITROSTAT) 0.4 MG SL tablet Place 1 tablet under the tongue every 5 (five) minutes as needed. Reported on 12/16/2015 07/15/15 07/14/16 Yes Historical Provider, MD  pantoprazole (PROTONIX) 40 MG tablet Take 40 mg by mouth daily.  08/26/15  Yes Historical Provider, MD  Polyethylene Glycol 3350 (MIRALAX PO) Take 17 g by mouth daily.   Yes Historical Provider, MD  tamsulosin (FLOMAX) 0.4 MG CAPS capsule Take 1 capsule (0.4 mg total) by mouth daily after supper. 12/16/15  Yes Roda Shutters, FNP  Tiotropium Bromide Monohydrate (SPIRIVA RESPIMAT) 2.5 MCG/ACT AERS Inhale 2 puffs into the lungs See admin instructions. Inhale 2 puffs 1 to 2 times a day.   Yes Historical Provider, MD  VENTOLIN HFA 108 (90 BASE) MCG/ACT inhaler Inhale 1-2 puffs into the lungs as needed. Reported on 12/16/2015 06/24/15  Yes Historical Provider, MD  furosemide (LASIX) 40 MG tablet Take 1 tablet (40 mg total) by mouth daily. Patient taking differently: Take 20 mg by mouth daily.  11/27/15   Carrie Mew, MD  senna-docusate (SENOKOT-S) 8.6-50 MG tablet Take 2 tablets by mouth 2 (two) times daily. 11/14/15   Epifanio Lesches, MD   Physical Exam: Filed Vitals:   05/09/16 1048 05/09/16 1053 05/09/16 1102  BP: 102/54    Pulse: 79    Temp: 98.2 F (36.8 C)    TempSrc: Oral    Resp: 19    Height:  6\' 2"  (1.88 m)   Weight:  97.523 kg (215 lb)   SpO2: 94%  97%     General:  Chronically ill appearing in NAD, WDWN, [place]/AT  Eyes: PERRL, EOMI, no scleral icterus, conjunctiva clear  ENT: moist oropharynx without lesions, TM's benign, dentition poor  Neck: supple, no lymphadenopathy. No bruits or thyromegaly  Cardiovascular: regular rate without MRG; 2+ peripheral pulses, no JVD, 1+  peripheral edema  Respiratory: Basilar rales without wheezing, rhonchi. No dullness. Respiratory effort normal  Abdomen: soft, non tender to palpation, positive bowel sounds, no guarding, no rebound  Skin: no rashes or lesions  Musculoskeletal: normal bulk and tone, no joint swelling  Psychiatric: normal mood and affect, A&OX3  Neurologic: CN 2-12 grossly intact, Motor strength 5/5 in all 4 groups with symmetric DTR's and non-focal sensory exam  Labs on Admission:  Basic Metabolic Panel:  Recent Labs Lab 05/09/16 1103  NA 136  K 3.7  CL 103  CO2 24  GLUCOSE 175*  BUN 25*  CREATININE 1.37*  CALCIUM 9.2   Liver Function Tests:  Recent Labs Lab 05/09/16 1103  AST 29  ALT 17  ALKPHOS 56  BILITOT 1.2  PROT 6.1*  ALBUMIN 3.7    Recent Labs Lab 05/09/16 1103  LIPASE  19   No results for input(s): AMMONIA in the last 168 hours. CBC:  Recent Labs Lab 05/09/16 1103  WBC 13.4*  NEUTROABS 12.4*  HGB 10.6*  HCT 32.1*  MCV 88.9  PLT 144*   Cardiac Enzymes:  Recent Labs Lab 05/09/16 1103  TROPONINI 1.99*    BNP (last 3 results)  Recent Labs  07/15/15 1203  BNP 282.0*    ProBNP (last 3 results) No results for input(s): PROBNP in the last 8760 hours.  CBG: No results for input(s): GLUCAP in the last 168 hours.  Radiological Exams on Admission: Dg Chest 2 View  05/09/2016  CLINICAL DATA:  Nausea and vomiting. EXAM: CHEST  2 VIEW COMPARISON:  November 27, 2015 FINDINGS: Mild cardiomegaly is stable. The hila and mediastinum are unchanged. No pneumothorax. No pulmonary nodules, masses, or focal infiltrates. Probable tiny effusions are identified on the lateral view. IMPRESSION: Tiny pleural effusions.  No other acute abnormalities. Electronically Signed   By: Dorise Bullion III M.D   On: 05/09/2016 11:21   US Abdomen Limited Ruq  05/09/2016  CLINICAL DATA:  Right upper quadrant abdominal tenderness for the past 2 days. Bilious vomiting. EXAM: US  ABDOMEN LIMITED - RIGHT UPPER QUADRANT COMPARISON:  11/07/2015 and abdomen CT dated 11/06/2015. FINDINGS: Gallbladder: None uniform wall thickening with areas that are more focally thickened without a discrete mass visualized. Two small, non mobile gallstones. The largest measures 5 mm in maximum diameter. The patient was not focally tender over the gallbladder. No pericholecystic fluid. Common bile duct: Diameter: 6.0 mm Liver: No focal lesion identified. Within normal limits in parenchymal echogenicity. Other:  Minimal free peritoneal fluid adjacent to the liver. IMPRESSION: 1. Cholelithiasis. 2. Nonuniform gallbladder wall thickening. This is most likely due to chronic cholecystitis. 3. Minimal free peritoneal fluid. Electronically Signed   By: Claudie Revering M.D.   On: 05/09/2016 12:10    EKG: Independently reviewed.  Assessment/Plan Principal Problem:   NSTEMI (non-ST elevated myocardial infarction) ([place]) Active Problems:   COPD (chronic obstructive pulmonary disease) ([name])   Abdominal pain, bilateral upper quadrant   S/P CABG (coronary artery bypass graft)   Will admit to floor with Heparin drip. Optimize cardiac meds. Order echo and consult Cardiology. Follow enzymes. [place] of abdomen ordered. Repeat labs in AM. Wean O2 as tolerated. Pulmonary toilet ordered  Diet: low sodium Fluids: NS@kvo  DVT Prophylaxis: IV heparin  Code Status: FULL  Family Communication: yes  Disposition Plan: home  Time spent: 55 min

## 2016-05-09 NOTE — H&P (Signed)
Justin Leonard is a 82 y.o. male  [id number]  Primary Cardiologist: [name] Reason for Consultation: Chest pain and elevated troponin  HPI: This is a 82 year old white male whose been having shaking sensation without any fever since yesterday and lasts night according to the wife. But also had some shortness of breath and chest pain Thursday presented to the hospital after being a bit confused and hypotensive.   Review of Systems: No orthopnea PND or leg swelling but did have chest pain   Past Medical History  Diagnosis Date  . CHF (congestive heart failure) ([place])   . Hypertension   . Diabetes mellitus without complication ([name])   . Coronary artery disease   . Hyperlipidemia   . Deafness   . Peripheral neuropathy ([name])   . Back pain   . Umbilical hernia   . Depression   . Melena   . COPD (chronic obstructive pulmonary disease) ([place])   . Obesity   . Peripheral neuropathy ([name])     bil.hands and feet  . Skin cancer     Medications Prior to Admission  Medication Sig Dispense Refill  . acetaminophen (TYLENOL) 325 MG tablet Take 650 mg by mouth every 6 (six) hours as needed. Reported on 12/16/2015    . aspirin EC 81 MG tablet Take 81 mg by mouth daily.    . ferrous sulfate 325 (65 FE) MG tablet Take 325 mg by mouth daily with breakfast.    . FLUoxetine (PROZAC) 20 MG capsule Take 20 mg by mouth daily.    . furosemide (LASIX) 20 MG tablet Take 20 mg by mouth daily.    . insulin glargine (LANTUS) 100 UNIT/ML injection Inject 12 Units into the skin at bedtime.     . lovastatin (MEVACOR) 20 MG tablet Take 20 mg by mouth at bedtime.    . metFORMIN (GLUCOPHAGE) 500 MG tablet Take 500 mg by mouth 3 (three) times daily.    . metoprolol succinate (TOPROL-XL) 25 MG 24 hr tablet Take 25 mg by mouth daily.    . nitroGLYCERIN (NITROSTAT) 0.4 MG SL tablet Place 1 tablet under the tongue every 5 (five) minutes as needed. Reported on 12/16/2015    . pantoprazole (PROTONIX) 40 MG  tablet Take 40 mg by mouth daily.     . Polyethylene Glycol 3350 (MIRALAX PO) Take 17 g by mouth daily.    . tamsulosin (FLOMAX) 0.4 MG CAPS capsule Take 1 capsule (0.4 mg total) by mouth daily after supper. 30 capsule 12  . Tiotropium Bromide Monohydrate (SPIRIVA RESPIMAT) 2.5 MCG/ACT AERS Inhale 2 puffs into the lungs See admin instructions. Inhale 2 puffs 1 to 2 times a day.    . VENTOLIN HFA 108 (90 BASE) MCG/ACT inhaler Inhale 1-2 puffs into the lungs as needed. Reported on 12/16/2015    . furosemide (LASIX) 40 MG tablet Take 1 tablet (40 mg total) by mouth daily. (Patient taking differently: Take 20 mg by mouth daily. ) 10 tablet 0  . senna-docusate (SENOKOT-S) 8.6-50 MG tablet Take 2 tablets by mouth 2 (two) times daily. 30 tablet 0     . aspirin EC  81 mg Oral Daily  . docusate sodium  100 mg Oral BID  . famotidine (PEPCID) IV  20 mg Intravenous Q12H  . [START ON 05/10/2016] ferrous sulfate  325 mg Oral Q breakfast  . FLUoxetine  20 mg Oral Daily  . furosemide  40 mg Oral Daily  . insulin aspart  0-5 Units  Subcutaneous QHS  . insulin aspart  0-9 Units Subcutaneous TID WC  . insulin glargine  12 Units Subcutaneous QHS  . ipratropium-albuterol  3 mL Nebulization QID  . metoprolol succinate  25 mg Oral Daily  . pantoprazole  40 mg Oral Daily  . pravastatin  20 mg Oral q1800  . senna-docusate  2 tablet Oral BID  . sodium chloride flush  3 mL Intravenous Q12H  . tamsulosin  0.4 mg Oral QPC supper  . tiotropium  18 mcg Inhalation Daily    Infusions: . heparin 1,300 Units/hr (05/09/16 1824)    No Known Allergies  Social History   Social History  . Marital Status: Married    Spouse Name: N/A  . Number of Children: N/A  . Years of Education: N/A   Occupational History  . Not on file.   Social History Main Topics  . Smoking status: Former Smoker -- 3.00 packs/day for 30 years    Types: Cigarettes    Quit date: 04/16/1978  . Smokeless tobacco: Never Used     Comment:  Quit smoking 1979  . Alcohol Use: No  . Drug Use: No  . Sexual Activity: Not Currently   Other Topics Concern  . Not on file   Social History Narrative    Family History  Problem Relation Age of Onset  . Anemia Neg Hx   . Arrhythmia Neg Hx   . Asthma Neg Hx   . Clotting disorder Neg Hx   . Fainting Neg Hx   . Heart attack Neg Hx   . Heart disease Neg Hx   . Heart failure Neg Hx   . Hyperlipidemia Neg Hx   . Hypertension Father   . CVA Father     PHYSICAL EXAM: Filed Vitals:   05/09/16 1611 05/09/16 1635  BP: 134/62 140/60  Pulse: 81 101  Temp: 98 F (36.7 C) 98.2 F (36.8 C)  Resp: 18      Intake/Output Summary (Last 24 hours) at 05/09/16 1956 Last data filed at 05/09/16 1824  Gross per 24 hour  Intake  75.83 ml  Output    100 ml  Net -24.17 ml    General:  Well appearing. No respiratory difficulty HEENT: normal Neck: supple. no JVD. Carotids 2+ bilat; no bruits. No lymphadenopathy or thryomegaly appreciated. Cor: PMI nondisplaced. Regular rate & rhythm. No rubs, gallops or murmurs. Lungs: clear Abdomen: soft, nontender, nondistended. No hepatosplenomegaly. No bruits or masses. Good bowel sounds. Extremities: no cyanosis, clubbing, rash, edema Neuro: alert & oriented x 3, cranial nerves grossly intact. moves all 4 extremities w/o difficulty. Affect pleasant.  [id number] rhythm with inferior wall myocardial infarction and right bundle branch block with nonspecific ST-T changes  Results for orders placed or performed during the hospital encounter of 05/09/16 (from the past 24 hour(s))  Lactic acid, plasma     Status: None   Collection Time: 05/09/16 11:03 AM  Result Value Ref Range   Lactic Acid, Venous 1.8 0.5 - 2.0 mmol/L  Comprehensive metabolic panel     Status: Abnormal   Collection Time: 05/09/16 11:03 AM  Result Value Ref Range   Sodium 136 135 - 145 mmol/L   Potassium 3.7 3.5 - 5.1 mmol/L   Chloride 103 101 - 111 mmol/L   CO2 24 22 - 32 mmol/L    Glucose, Bld 175 (H) 65 - 99 mg/dL   BUN 25 (H) 6 - 20 mg/dL   Creatinine, Ser 1.37 (H) 0.61 - 1.24 mg/dL  Calcium 9.2 8.9 - 10.3 mg/dL   Total Protein 6.1 (L) 6.5 - 8.1 g/dL   Albumin 3.7 3.5 - 5.0 g/dL   AST 29 15 - 41 U/L   ALT 17 17 - 63 U/L   Alkaline Phosphatase 56 38 - 126 U/L   Total Bilirubin 1.2 0.3 - 1.2 mg/dL   GFR calc non Af Amer 45 (L) >60 mL/min   GFR calc Af Amer 52 (L) >60 mL/min   Anion gap 9 5 - 15  Lipase, blood     Status: None   Collection Time: 05/09/16 11:03 AM  Result Value Ref Range   Lipase 19 11 - 51 U/L  Troponin I     Status: Abnormal   Collection Time: 05/09/16 11:03 AM  Result Value Ref Range   Troponin I 1.99 (H) <0.031 ng/mL  CBC WITH DIFFERENTIAL     Status: Abnormal   Collection Time: 05/09/16 11:03 AM  Result Value Ref Range   WBC 13.4 (H) 3.8 - 10.6 K/uL   RBC 3.61 (L) 4.40 - 5.90 MIL/uL   Hemoglobin 10.6 (L) 13.0 - 18.0 g/dL   HCT 32.1 (L) 40.0 - 52.0 %   MCV 88.9 80.0 - 100.0 fL   MCH 29.4 26.0 - 34.0 pg   MCHC 33.1 32.0 - 36.0 g/dL   RDW 13.8 11.5 - 14.5 %   Platelets 144 (L) 150 - 440 K/uL   Neutrophils Relative % 93% %   Neutro Abs 12.4 (H) 1.4 - 6.5 K/uL   Lymphocytes Relative 3% %   Lymphs Abs 0.4 (L) 1.0 - 3.6 K/uL   Monocytes Relative 4% %   Monocytes Absolute 0.6 0.2 - 1.0 K/uL   Eosinophils Relative 0% %   Eosinophils Absolute 0.0 0 - 0.7 K/uL   Basophils Relative 0% %   Basophils Absolute 0.0 0 - 0.1 K/uL  APTT     Status: None   Collection Time: 05/09/16 11:03 AM  Result Value Ref Range   aPTT 31 24 - 36 seconds  Protime-INR     Status: None   Collection Time: 05/09/16 11:03 AM  Result Value Ref Range   Prothrombin Time 15.0 11.4 - 15.0 seconds   INR 1.16   Troponin I     Status: Abnormal   Collection Time: 05/09/16  4:36 PM  Result Value Ref Range   Troponin I 2.86 (H) <0.031 ng/mL  Glucose, capillary     Status: Abnormal   Collection Time: 05/09/16  4:59 PM  Result Value Ref Range   Glucose-Capillary  136 (H) 65 - 99 mg/dL  Urinalysis complete, with microscopic ([facility] only)     Status: Abnormal   Collection Time: 05/09/16  6:45 PM  Result Value Ref Range   Color, Urine AMBER (A) YELLOW   APPearance CLEAR (A) CLEAR   Glucose, UA NEGATIVE NEGATIVE mg/dL   Bilirubin Urine NEGATIVE NEGATIVE   Ketones, ur NEGATIVE NEGATIVE mg/dL   Specific Gravity, Urine 1.023 1.005 - 1.030   Hgb urine dipstick NEGATIVE NEGATIVE   pH 5.0 5.0 - 8.0   Protein, ur 100 (A) NEGATIVE mg/dL   Nitrite NEGATIVE NEGATIVE   Leukocytes, UA NEGATIVE NEGATIVE   RBC / HPF 0-5 0 - 5 RBC/hpf   WBC, UA 0-5 0 - 5 WBC/hpf   Bacteria, UA RARE (A) NONE SEEN   Squamous Epithelial / LPF 0-5 (A) NONE SEEN   Mucous PRESENT    Hyaline Casts, UA PRESENT   Heparin level (unfractionated)  Status: None   Collection Time: 05/09/16  7:09 PM  Result Value Ref Range   Heparin Unfractionated 0.30 0.30 - 0.70 IU/mL   Dg Chest 2 View  05/09/2016  CLINICAL DATA:  Nausea and vomiting. EXAM: CHEST  2 VIEW COMPARISON:  November 27, 2015 FINDINGS: Mild cardiomegaly is stable. The hila and mediastinum are unchanged. No pneumothorax. No pulmonary nodules, masses, or focal infiltrates. Probable tiny effusions are identified on the lateral view. IMPRESSION: Tiny pleural effusions.  No other acute abnormalities. Electronically Signed   By: Dorise Bullion III M.D   On: 05/09/2016 11:21   US Abdomen Limited Ruq  05/09/2016  CLINICAL DATA:  Right upper quadrant abdominal tenderness for the past 2 days. Bilious vomiting. EXAM: US ABDOMEN LIMITED - RIGHT UPPER QUADRANT COMPARISON:  11/07/2015 and abdomen CT dated 11/06/2015. FINDINGS: Gallbladder: None uniform wall thickening with areas that are more focally thickened without a discrete mass visualized. Two small, non mobile gallstones. The largest measures 5 mm in maximum diameter. The patient was not focally tender over the gallbladder. No pericholecystic fluid. Common bile duct: Diameter: 6.0 mm  Liver: No focal lesion identified. Within normal limits in parenchymal echogenicity. Other:  Minimal free peritoneal fluid adjacent to the liver. IMPRESSION: 1. Cholelithiasis. 2. Nonuniform gallbladder wall thickening. This is most likely due to chronic cholecystitis. 3. Minimal free peritoneal fluid. Electronically Signed   By: Claudie Revering M.D.   On: 05/09/2016 12:10     ASSESSMENT AND PLAN: Elevated troponin most likely due to ruling in for non-STEMI and possible gallbladder disease. Patient is being evaluated by Dr. Pat Patrick for possible evaluation of gallbladder disease however in the meantime I think patient is having non-STEMI. We will go ahead and schedule cardiac catheterization tomorrow morning and have spoken to the family about it.  Hashem Goynes A over-the-top

## 2016-05-09 NOTE — ED Notes (Signed)
Pt to ED via EMS c/o nausea and vomiting starting yesterday. Pt reports vomiting green bile, c/o cough productive of green sputum. C/o mild shortness of breath with exertion. No c/o fevers at home, no blood noted in sputum or vomit. EMS reports BP ranging from 96/49-85/45. CBG 233.

## 2016-05-09 NOTE — ED Notes (Signed)
Family reports increased confusion today (e.g. took out blood sugar supplies and forgot what to do with it). Wears O2@2  at night.

## 2016-05-09 NOTE — Progress Notes (Signed)
ANTICOAGULATION CONSULT NOTE - Initial Consult  Pharmacy Consult for Heparin Indication: chest pain/ACS  No Known Allergies  Patient Measurements: Height: 6\' 2"  (188 cm) Weight: 215 lb (97.523 kg) IBW/kg (Calculated) : 82.2 Heparin Dosing Weight: 97.5 kg   Vital Signs: Temp: 98.2 F (36.8 C) (06/11 1048) Temp Source: Oral (06/11 1048) BP: 102/54 mmHg (06/11 1048) Pulse Rate: 79 (06/11 1048)  Labs:  Recent Labs  05/09/16 1103  HGB 10.6*  HCT 32.1*  PLT 144*  APTT 31  LABPROT 15.0  INR 1.16  CREATININE 1.37*  TROPONINI 1.99*    Estimated Creatinine Clearance: 45 mL/min (by C-G formula based on Cr of 1.37).   Medical History: Past Medical History  Diagnosis Date  . CHF (congestive heart failure) ([place])   . Hypertension   . Diabetes mellitus without complication ([place])   . Coronary artery disease   . Hyperlipidemia   . Deafness   . Peripheral neuropathy ([place])   . Back pain   . Umbilical hernia   . Depression   . Melena   . COPD (chronic obstructive pulmonary disease) ([place])   . Obesity   . Peripheral neuropathy ([name])     bil.hands and feet  . Skin cancer     Medications:   (Not in a hospital admission)  Assessment: Pharmacy consulted to dose heparin in this 82 year old male admitted with ACS/NSTEMI.  No prior anticoag noted. CrCl = 45 ml/min  Goal of Therapy:  Heparin level 0.3-0.7 units/ml Monitor platelets by anticoagulation protocol: Yes   Plan:  Give 4000 units bolus x 1 Start heparin infusion at 1300 units/hr  Will draw 1st HL 8 hrs after start of drip. Will check CBC daily.   Anglea Gordner D 05/09/2016,11:52 AM

## 2016-05-09 NOTE — Progress Notes (Addendum)
ANTICOAGULATION CONSULT NOTE - Initial Consult  Pharmacy Consult for Heparin Indication: chest pain/ACS  No Known Allergies  Patient Measurements: Height: 6\' 2"  (188 cm) Weight: 224 lb (101.606 kg) IBW/kg (Calculated) : 82.2 Heparin Dosing Weight: 97.5 kg   Vital Signs: Temp: 98.2 F (36.8 C) (06/11 2018) Temp Source: Oral (06/11 2018) BP: 98/41 mmHg (06/11 2018) Pulse Rate: 80 (06/11 2018)  Labs:  Recent Labs  05/09/16 1103 05/09/16 1636 05/09/16 1909  HGB 10.6*  --   --   HCT 32.1*  --   --   PLT 144*  --   --   APTT 31  --   --   LABPROT 15.0  --   --   INR 1.16  --   --   HEPARINUNFRC  --   --  0.30  CREATININE 1.37*  --   --   TROPONINI 1.99* 2.86*  --     Estimated Creatinine Clearance: 49.3 mL/min (by C-G formula based on Cr of 1.37).   Medical History: Past Medical History  Diagnosis Date  . CHF (congestive heart failure) ([place])   . Hypertension   . Diabetes mellitus without complication ([place])   . Coronary artery disease   . Hyperlipidemia   . Deafness   . Peripheral neuropathy ([place])   . Back pain   . Umbilical hernia   . Depression   . Melena   . COPD (chronic obstructive pulmonary disease) ([place])   . Obesity   . Peripheral neuropathy ([name])     bil.hands and feet  . Skin cancer     Medications:  Prescriptions prior to admission  Medication Sig Dispense Refill Last Dose  . acetaminophen (TYLENOL) 325 MG tablet Take 650 mg by mouth every 6 (six) hours as needed. Reported on 12/16/2015   Past Week at Unknown time  . aspirin EC 81 MG tablet Take 81 mg by mouth daily.   05/08/2016 at Unknown time  . ferrous sulfate 325 (65 FE) MG tablet Take 325 mg by mouth daily with breakfast.   05/08/2016 at Unknown time  . FLUoxetine (PROZAC) 20 MG capsule Take 20 mg by mouth daily.   05/08/2016 at Unknown time  . furosemide (LASIX) 20 MG tablet Take 20 mg by mouth daily.   05/08/2016 at Unknown time  . insulin glargine (LANTUS) 100 UNIT/ML injection Inject 12  Units into the skin at bedtime.    05/08/2016 at Unknown time  . lovastatin (MEVACOR) 20 MG tablet Take 20 mg by mouth at bedtime.   05/08/2016 at Unknown time  . metFORMIN (GLUCOPHAGE) 500 MG tablet Take 500 mg by mouth 3 (three) times daily.   05/08/2016 at Unknown time  . metoprolol succinate (TOPROL-XL) 25 MG 24 hr tablet Take 25 mg by mouth daily.   05/08/2016 at [place]  . nitroGLYCERIN (NITROSTAT) 0.4 MG SL tablet Place 1 tablet under the tongue every 5 (five) minutes as needed. Reported on 12/16/2015   05/08/2016 at Unknown time  . pantoprazole (PROTONIX) 40 MG tablet Take 40 mg by mouth daily.    05/08/2016 at Unknown time  . Polyethylene Glycol 3350 (MIRALAX PO) Take 17 g by mouth daily.   05/08/2016 at Unknown time  . tamsulosin (FLOMAX) 0.4 MG CAPS capsule Take 1 capsule (0.4 mg total) by mouth daily after supper. 30 capsule 12 05/08/2016 at Unknown time  . Tiotropium Bromide Monohydrate (SPIRIVA RESPIMAT) 2.5 MCG/ACT AERS Inhale 2 puffs into the lungs See admin instructions. Inhale 2 puffs 1 to  2 times a day.   05/08/2016 at Unknown time  . VENTOLIN HFA 108 (90 BASE) MCG/ACT inhaler Inhale 1-2 puffs into the lungs as needed. Reported on 12/16/2015   Past Week at Unknown time  . furosemide (LASIX) 40 MG tablet Take 1 tablet (40 mg total) by mouth daily. (Patient taking differently: Take 20 mg by mouth daily. ) 10 tablet 0 Taking  . senna-docusate (SENOKOT-S) 8.6-50 MG tablet Take 2 tablets by mouth 2 (two) times daily. 30 tablet 0 Taking    Assessment: Pharmacy consulted to dose heparin in this 82 year old male admitted with ACS/NSTEMI.  No prior anticoag noted. CrCl = 45 ml/min  Goal of Therapy:  Heparin level 0.3-0.7 units/ml Monitor platelets by anticoagulation protocol: Yes   Plan:  Heparin level therapeutic x 2. Continue current rate. Pharmacy will continue to monitor daily. NAC  Robbins,Jason D 05/09/2016,9:13 PM

## 2016-05-09 NOTE — ED Notes (Signed)
Pt transported by RN to floor.

## 2016-05-10 ENCOUNTER — Encounter: Payer: Self-pay | Admitting: Cardiovascular Disease

## 2016-05-10 ENCOUNTER — Encounter
Admission: EM | Disposition: A | Discharge status: Home / Self Care | Payer: Self-pay | Source: Home / Self Care | Attending: Internal Medicine

## 2016-05-10 ENCOUNTER — Inpatient Hospital Stay
Admit: 2016-05-10 | Discharge: 2016-05-10 | Disposition: A | Discharge status: Home / Self Care | Payer: Medicare Other | Attending: Cardiovascular Disease | Admitting: Cardiovascular Disease

## 2016-05-10 DIAGNOSIS — K8000 Calculus of gallbladder with acute cholecystitis without obstruction: Secondary | ICD-10-CM

## 2016-05-10 HISTORY — PX: CARDIAC CATHETERIZATION: SHX172

## 2016-05-10 LAB — COMPREHENSIVE METABOLIC PANEL
ALBUMIN: 3.2 g/dL — AB (ref 3.5–5.0)
ALT: 19 U/L (ref 17–63)
ANION GAP: 7 (ref 5–15)
AST: 39 U/L (ref 15–41)
Alkaline Phosphatase: 50 U/L (ref 38–126)
BUN: 30 mg/dL — AB (ref 6–20)
CHLORIDE: 105 mmol/L (ref 101–111)
CO2: 23 mmol/L (ref 22–32)
Calcium: 8.6 mg/dL — ABNORMAL LOW (ref 8.9–10.3)
Creatinine, Ser: 1.62 mg/dL — ABNORMAL HIGH (ref 0.61–1.24)
GFR calc Af Amer: 43 mL/min — ABNORMAL LOW (ref >60)
GFR calc non Af Amer: 37 mL/min — ABNORMAL LOW (ref >60)
Glucose, Bld: 129 mg/dL — ABNORMAL HIGH (ref 65–99)
POTASSIUM: 4.4 mmol/L (ref 3.5–5.1)
SODIUM: 135 mmol/L (ref 135–145)
TOTAL PROTEIN: 5.6 g/dL — AB (ref 6.5–8.1)
Total Bilirubin: 1 mg/dL (ref 0.3–1.2)

## 2016-05-10 LAB — GLUCOSE, CAPILLARY
GLUCOSE-CAPILLARY: 95 mg/dL (ref 65–99)
Glucose-Capillary: 169 mg/dL — ABNORMAL HIGH (ref 65–99)
Glucose-Capillary: 153 mg/dL — ABNORMAL HIGH (ref 65–99)

## 2016-05-10 LAB — CBC
HEMATOCRIT: 28 % — AB (ref 40.0–52.0)
HEMOGLOBIN: 9.6 g/dL — AB (ref 13.0–18.0)
MCH: 31.4 pg (ref 26.0–34.0)
MCHC: 34.5 g/dL (ref 32.0–36.0)
MCV: 91 fL (ref 80.0–100.0)
Platelets: 108 10*3/uL — ABNORMAL LOW (ref 150–440)
RBC: 3.07 MIL/uL — ABNORMAL LOW (ref 4.40–5.90)
RDW: 14.2 % (ref 11.5–14.5)
WBC: 10.8 10*3/uL — ABNORMAL HIGH (ref 3.8–10.6)

## 2016-05-10 LAB — ECHOCARDIOGRAM COMPLETE
HEIGHTINCHES: 74 in
Weight: 3544 oz

## 2016-05-10 LAB — TROPONIN I: Troponin I: 1.99 ng/mL — ABNORMAL HIGH (ref <0.031)

## 2016-05-10 LAB — HEPARIN LEVEL (UNFRACTIONATED): Heparin Unfractionated: 0.46 IU/mL (ref 0.30–0.70)

## 2016-05-10 SURGERY — LEFT HEART CATH AND CORONARY ANGIOGRAPHY
Anesthesia: Moderate Sedation | Laterality: Right

## 2016-05-10 MED ORDER — MIDAZOLAM HCL 2 MG/2ML IJ SOLN
INTRAMUSCULAR | Status: AC
  Filled 2016-05-10: qty 2

## 2016-05-10 MED ORDER — SODIUM CHLORIDE 0.9% FLUSH: 3.0000 mL | INTRAVENOUS | Status: DC | PRN

## 2016-05-10 MED ORDER — SODIUM CHLORIDE 0.9% FLUSH
3.0000 mL | Freq: Two times a day (BID) | INTRAVENOUS | Status: DC
  Administered 2016-05-10 – 2016-05-12 (×4): 3 mL via INTRAVENOUS

## 2016-05-10 MED ORDER — ACETAMINOPHEN 325 MG PO TABS: 650.0000 mg | ORAL_TABLET | ORAL | Status: DC | PRN

## 2016-05-10 MED ORDER — SODIUM CHLORIDE 0.9 % IV SOLN: 250.0000 mL | INTRAVENOUS | Status: DC | PRN

## 2016-05-10 MED ORDER — HEPARIN (PORCINE) IN NACL 2-0.9 UNIT/ML-% IJ SOLN
INTRAMUSCULAR | Status: AC
  Filled 2016-05-10: qty 500

## 2016-05-10 MED ORDER — SODIUM CHLORIDE 0.9 % WEIGHT BASED INFUSION: 1.0000 mL/kg/h | INTRAVENOUS | Status: DC

## 2016-05-10 MED ORDER — SODIUM CHLORIDE 0.9% FLUSH: 3.0000 mL | Freq: Two times a day (BID) | INTRAVENOUS | Status: DC

## 2016-05-10 MED ORDER — ONDANSETRON HCL 4 MG/2ML IJ SOLN: 4.0000 mg | Freq: Four times a day (QID) | INTRAMUSCULAR | Status: DC | PRN

## 2016-05-10 MED ORDER — PIPERACILLIN-TAZOBACTAM 3.375 G IVPB
3.3750 g | Freq: Three times a day (TID) | INTRAVENOUS | Status: DC
  Administered 2016-05-10 – 2016-05-12 (×6): 3.375 g via INTRAVENOUS
  Filled 2016-05-10 (×8): qty 50

## 2016-05-10 MED ORDER — IPRATROPIUM-ALBUTEROL 0.5-2.5 (3) MG/3ML IN SOLN: 3.0000 mL | RESPIRATORY_TRACT | Status: DC | PRN

## 2016-05-10 MED ORDER — FENTANYL CITRATE (PF) 100 MCG/2ML IJ SOLN
INTRAMUSCULAR | Status: AC
  Filled 2016-05-10: qty 2

## 2016-05-10 MED ORDER — IOPAMIDOL (ISOVUE-300) INJECTION 61%
INTRAVENOUS | Status: DC | PRN
  Administered 2016-05-10: 100 mL via INTRA_ARTERIAL

## 2016-05-10 MED ORDER — ASPIRIN 81 MG PO CHEW
81.0000 mg | CHEWABLE_TABLET | ORAL | Status: AC
  Administered 2016-05-10: 81 mg via ORAL
  Filled 2016-05-10: qty 1

## 2016-05-10 MED ORDER — FENTANYL CITRATE (PF) 100 MCG/2ML IJ SOLN
INTRAMUSCULAR | Status: DC | PRN
  Administered 2016-05-10: 25 ug via INTRAVENOUS

## 2016-05-10 MED ORDER — ASPIRIN 81 MG PO CHEW: 81.0000 mg | CHEWABLE_TABLET | ORAL | Status: DC

## 2016-05-10 MED ORDER — MIDAZOLAM HCL 2 MG/2ML IJ SOLN
INTRAMUSCULAR | Status: DC | PRN
Start: 2016-05-10 — End: 2016-05-10
  Administered 2016-05-10: 1 mg via INTRAVENOUS
  Administered 2016-05-10: 0.5 mg via INTRAVENOUS

## 2016-05-10 MED ORDER — SODIUM CHLORIDE 0.9 % WEIGHT BASED INFUSION: 3.0000 mL/kg/h | INTRAVENOUS | Status: DC

## 2016-05-10 MED ORDER — SODIUM CHLORIDE 0.9 % WEIGHT BASED INFUSION: 1.0000 mL/kg/h | INTRAVENOUS | Status: AC

## 2016-05-10 SURGICAL SUPPLY — 11 items
CATH ANGIO 5F JB2 100CM (CATHETERS) ×3 IMPLANT
CATH INFINITI 5 FR IM (CATHETERS) ×3 IMPLANT
CATH INFINITI 5FR ANG PIGTAIL (CATHETERS) ×3 IMPLANT
CATH INFINITI 5FR JL4 (CATHETERS) ×3 IMPLANT
CATH INFINITI JR4 5F (CATHETERS) ×3 IMPLANT
KIT MANI 3VAL PERCEP (MISCELLANEOUS) ×3 IMPLANT
NEEDLE PERC 18GX7CM (NEEDLE) ×3 IMPLANT
PACK CARDIAC CATH (CUSTOM PROCEDURE TRAY) ×3 IMPLANT
SHEATH PINNACLE 5F 10CM (SHEATH) ×6 IMPLANT
WIRE EMERALD 3MM-J .035X150CM (WIRE) ×3 IMPLANT
WIRE EMERALD 3MM-J .035X260CM (WIRE) ×3 IMPLANT

## 2016-05-10 NOTE — Progress Notes (Addendum)
Acute on chronic cholecystitis She was significant coronary artery disease and elevation of troponins c/w non stemi Cardiac Did not reveal any major new obstructive lesions amenable for intervention He feels better  PE NAD, debilitated elderly male Abd: soft, mild TTP RLQ, no peritonitis or Murphy sign  A/P recent non stemi with recurrent cholecystitis. Discussed with patient in detail do think that he carries significant perioperative risk and I do not feel comfortable doing a cholecystectomy at least in the next 3-6 months after his myocardial injury has stabilyse. More Importantly he is responding to antibiotic therapy. No need For emergent surgical intervention A lengthy discussion with the patient and the family about his perioperative morbidity and mortality related to all his comorbidities. I think that the safest course of action is to do IV antibiotics and if this does not respond to perform a cholecystostomy tube. I will leave a cholecystectomy as a last resort. I do anticipate that doing a cholecystectomy will carry significant risk specially because this is going to be a long case with significant inflammatory response with a higher chance of conversion to an open cholecystectomy. We will continue to follow. Extensive counseling provided to the family, d/w primary team At least 35 minutes spent in this encounter w the majority of time spent counseling the pt and family

## 2016-05-10 NOTE — Progress Notes (Signed)
[facility] [place] NAME: Bowyn Nevil    MR#:  [id number]  DATE OF BIRTH:  September 22, 1927  SUBJECTIVE:   Patient seen after cardiac catheterization. Patient had no blockages in bypass grafts.  REVIEW OF SYSTEMS:    Review of Systems  Constitutional: Negative for fever, chills and malaise/fatigue.  HENT: Negative for ear discharge, ear pain, hearing loss, nosebleeds and sore throat.   Eyes: Negative for blurred vision and pain.  Respiratory: Negative for cough, hemoptysis, shortness of breath and wheezing.   Cardiovascular: Negative for chest pain, palpitations and leg swelling.  Gastrointestinal: Positive for nausea and abdominal pain. Negative for vomiting, diarrhea and blood in stool.  Genitourinary: Negative for dysuria.  Musculoskeletal: Negative for back pain.  Neurological: Positive for weakness. Negative for dizziness, tremors, speech change, focal weakness, seizures and headaches.  Endo/Heme/Allergies: Does not bruise/bleed easily.  Psychiatric/Behavioral: Negative for depression, suicidal ideas and hallucinations.    Tolerating Diet: yes      DRUG ALLERGIES:  No Known Allergies  VITALS:  Blood pressure 103/44, pulse 55, temperature 97.8 F (36.6 C), temperature source Oral, resp. rate 16, height 6\' 2"  (1.88 m), weight 100.472 kg (221 lb 8 oz), SpO2 93 %.  PHYSICAL EXAMINATION:   Physical Exam  Constitutional: He is oriented to person, place, and time and well-developed, well-nourished, and in no distress. No distress.  HENT:  Head: Normocephalic.  Eyes: No scleral icterus.  Neck: Normal range of motion. Neck supple. No JVD present. No tracheal deviation present.  Cardiovascular: Normal rate and regular rhythm.  Exam reveals no gallop and no friction rub.   Murmur heard. Pulmonary/Chest: Effort normal and breath sounds normal. No respiratory distress. He has no wheezes. He has no rales. He exhibits no tenderness.   Abdominal: Soft. Bowel sounds are normal. He exhibits no distension and no mass. There is no tenderness. There is no rebound and no guarding.  Musculoskeletal: Normal range of motion. He exhibits no edema.  Neurological: He is alert and oriented to person, place, and time.  Skin: Skin is warm. No rash noted. No erythema.  Psychiatric: Affect and judgment normal.      LABORATORY PANEL:   CBC  Recent Labs Lab 05/10/16 0403  WBC 10.8*  HGB 9.6*  HCT 28.0*  PLT 108*   ------------------------------------------------------------------------------------------------------------------  Chemistries   Recent Labs Lab 05/10/16 0403  NA 135  K 4.4  CL 105  CO2 23  GLUCOSE 129*  BUN 30*  CREATININE 1.62*  CALCIUM 8.6*  AST 39  ALT 19  ALKPHOS 50  BILITOT 1.0   ------------------------------------------------------------------------------------------------------------------  Cardiac Enzymes  Recent Labs Lab 05/09/16 1636 05/09/16 2204 05/10/16 0403  TROPONINI 2.86* 2.57* 1.99*   ------------------------------------------------------------------------------------------------------------------  RADIOLOGY:  Dg Chest 2 View  05/09/2016  CLINICAL DATA:  Nausea and vomiting. EXAM: CHEST  2 VIEW COMPARISON:  November 27, 2015 FINDINGS: Mild cardiomegaly is stable. The hila and mediastinum are unchanged. No pneumothorax. No pulmonary nodules, masses, or focal infiltrates. Probable tiny effusions are identified on the lateral view. IMPRESSION: Tiny pleural effusions.  No other acute abnormalities. Electronically Signed   By: Dorise Bullion III M.D   On: 05/09/2016 11:21   US Abdomen Limited Ruq  05/09/2016  CLINICAL DATA:  Right upper quadrant abdominal tenderness for the past 2 days. Bilious vomiting. EXAM: US ABDOMEN LIMITED - RIGHT UPPER QUADRANT COMPARISON:  11/07/2015 and abdomen CT dated 11/06/2015. FINDINGS: Gallbladder: None uniform wall thickening with areas that are  more focally thickened without a discrete mass visualized. Two small, non mobile gallstones. The largest measures 5 mm in maximum diameter. The patient was not focally tender over the gallbladder. No pericholecystic fluid. Common bile duct: Diameter: 6.0 mm Liver: No focal lesion identified. Within normal limits in parenchymal echogenicity. Other:  Minimal free peritoneal fluid adjacent to the liver. IMPRESSION: 1. Cholelithiasis. 2. Nonuniform gallbladder wall thickening. This is most likely due to chronic cholecystitis. 3. Minimal free peritoneal fluid. Electronically Signed   By: Claudie Revering M.D.   On: 05/09/2016 12:10     ASSESSMENT AND PLAN:   82 year old male with a past medical history significant for ASCVD status post CABG and stents who presented with elevated troponin with chest pain and abdominal pain.  1. Elevation in troponin: Patient underwent cardiac catheterization which shows patent grafts. He had moderate to severe stenosis in OM.Marland Kitchen Discussed with Dr.Khan. He does not believe diagnosis to be non-STEMI. The patient troponin due to demand ischemia from cholecystitis. Obtain echocardiogram for ejection fraction.  2. Acute on chronic cholecystitis: Continue Zosyn. I spoke with Dr Arther Abbott. Patient at very high risk for any surgical procedures. At this time surgery is not recommended. This is also discussed with the family.  3. Diabetes: Continue sliding scale insulin, Lantus and ADA diet.   4. ASCVD: Continue aspirin, metoprolol and pravastatin.  5. COPD: Patient does not appear to be in exacerbation. Continue oxygen and   Management plans discussed with the patient and he is in agreement. D/w family and Dr Arther Abbott   CODE STATUS: FULL  TOTAL TIME TAKING CARE OF THIS PATIENT: 34 minutes.     POSSIBLE D/C 3-5 days, DEPENDING ON CLINICAL CONDITION.   Vint Pola M.D on 05/10/2016 at 11:53 AM  Between 7am to 6pm - Pager - [phone] After 6pm go to [email or web address] -  password [email or web address] [facility]  Office  [phone]  CC: Primary care physician; [name], MD  Note: This dictation was prepared with Dragon dictation along with smaller phrase technology. Any transcriptional errors that result from this process are unintentional.

## 2016-05-10 NOTE — Care Management (Signed)
RNCM consult for home health needs. Met with patient, wife and son at bedside. Patient lives at home with his wife of 16 years and their youngest son. Their other 2 sons stop by every day and all are active in caring/supporting for each other. Patient uses a cane but also has a walker if needed. He has home O2 through [facility] that he uses at night.  PCP is Dr. Kym Groom. Discussed home health services with family. Wife states in the past year patient has had home health 2 time and been to [facility] x 1. They have built a ramp outside their home. Wife feels patient has benefited from services and he does not need additional services at this time. She is appreciative of the information. She denies issues problems with patient accessing medical care, copays or transportation.  No home health needs but RNCM provided contact information if needs changed.

## 2016-05-10 NOTE — Clinical Documentation Improvement (Signed)
Cardiology  Can the diagnosis of CHF be further specified by type and acuity? Thank you    Acuity - Acute, Chronic, Acute on Chronic   Type - Systolic, Diastolic, Systolic and Diastolic  Other  Clinically Undetermined   Document any associated diagnoses/conditions   Supporting Information: NSTEMI, CHF, HTN  Treatment:  Continue home dose  Of Lasix   Please exercise your independent, professional judgment when responding. A specific answer is not anticipated or expected.   Thank You,  [place] [phone]

## 2016-05-10 NOTE — Progress Notes (Signed)
SUBJECTIVE: Patient denies any chest pain or shortness of breath   Filed Vitals:   05/10/16 0442 05/10/16 0723 05/10/16 0816 05/10/16 0903  BP: 104/43 103/50  94/46  Pulse: 62 59  56  Temp: 97.9 F (36.6 C) 97.8 F (36.6 C)    TempSrc:  Oral    Resp: 16 18  19   Height:      Weight: 221 lb 8 oz (100.472 kg)     SpO2: 93% 96% 96% 92%    Intake/Output Summary (Last 24 hours) at 05/10/16 0912 Last data filed at 05/10/16 0503  Gross per 24 hour  Intake  75.83 ml  Output    450 ml  Net -374.17 ml    LABS: Basic Metabolic Panel:  Recent Labs  05/09/16 1103 05/10/16 0403  NA 136 135  K 3.7 4.4  CL 103 105  CO2 24 23  GLUCOSE 175* 129*  BUN 25* 30*  CREATININE 1.37* 1.62*  CALCIUM 9.2 8.6*   Liver Function Tests:  Recent Labs  05/09/16 1103 05/10/16 0403  AST 29 39  ALT 17 19  ALKPHOS 56 50  BILITOT 1.2 1.0  PROT 6.1* 5.6*  ALBUMIN 3.7 3.2*    Recent Labs  05/09/16 1103  LIPASE 19   CBC:  Recent Labs  05/09/16 1103 05/10/16 0403  WBC 13.4* 10.8*  NEUTROABS 12.4*  --   HGB 10.6* 9.6*  HCT 32.1* 28.0*  MCV 88.9 91.0  PLT 144* 108*   Cardiac Enzymes:  Recent Labs  05/09/16 1636 05/09/16 2204 05/10/16 0403  TROPONINI 2.86* 2.57* 1.99*   BNP: Invalid input(s): POCBNP D-Dimer: No results for input(s): DDIMER in the last 72 hours. Hemoglobin A1C: No results for input(s): HGBA1C in the last 72 hours. Fasting Lipid Panel: No results for input(s): CHOL, HDL, LDLCALC, TRIG, CHOLHDL, LDLDIRECT in the last 72 hours. Thyroid Function Tests: No results for input(s): TSH, T4TOTAL, T3FREE, THYROIDAB in the last 72 hours.  Invalid input(s): FREET3 Anemia Panel: No results for input(s): VITAMINB12, FOLATE, FERRITIN, TIBC, IRON, RETICCTPCT in the last 72 hours.   PHYSICAL EXAM General: Well developed, well nourished, in no acute distress HEENT:  Normocephalic and atramatic Neck:  No JVD.  Lungs: Clear bilaterally to auscultation and  percussion. Heart: HRRR . Normal S1 and S2 without gallops or murmurs.  Abdomen: Bowel sounds are positive, abdomen soft and non-tender  Msk:  Back normal, normal gait. Normal strength and tone for age. Extremities: No clubbing, cyanosis or edema.   Neuro: Alert and oriented X 3. Psych:  Good affect, responds appropriately  TELEMETRY:Sinus rhythm  ASSESSMENT AND PLAN: Elevated troponin but no chest pain and no acute EKG changes. Artery catheterization revealed all grafts were open but OM graft had no disease but distal to the anastomosis site there was moderate to severe stenosis in the OM itself. Advise aggressive medical therapy by adding Plavix after it's decided that patient is going circumflex for surgery or not. Patient is low risk for surgery if needed.  Principal Problem:   NSTEMI (non-ST elevated myocardial infarction) ([place]) Active Problems:   COPD (chronic obstructive pulmonary disease) ([name])   Abdominal pain, bilateral upper quadrant   S/P CABG (coronary artery bypass graft)    [name], MD, [facility] 05/10/2016 9:12 AM     [place]

## 2016-05-10 NOTE — Progress Notes (Signed)
Pt up to the bathroom with standard walker.  No complaints of discomfort.  Wife in the room with the pt.  Tolerated the food well. Voiding clear yellow urine

## 2016-05-10 NOTE — Care Management Important Message (Signed)
Important Message  Patient Details  Name: Justin Leonard MRN: [id number] Date of Birth: [birth year]   Medicare Important Message Given:  Yes    Jolly Mango, RN 05/10/2016, 10:27 AM

## 2016-05-10 NOTE — Progress Notes (Signed)
Returned from the cardiac cath, no complaints of pain or discomfort.  Pt site is clean and dry.

## 2016-05-10 NOTE — Progress Notes (Signed)
Site clean and dry..  No verbal complaints of pain or discomfort.Pt alert and oriented x4, no complaints of pain or discomfort.  Bed in low position, call bell within reach.  Bed alarms on and functioning.  Assessment done and charted.  Will continue to monitor and do hourly rounding throughout the shift

## 2016-05-11 LAB — GLUCOSE, CAPILLARY
GLUCOSE-CAPILLARY: 150 mg/dL — AB (ref 65–99)
GLUCOSE-CAPILLARY: 150 mg/dL — AB (ref 65–99)
Glucose-Capillary: 126 mg/dL — ABNORMAL HIGH (ref 65–99)
Glucose-Capillary: 159 mg/dL — ABNORMAL HIGH (ref 65–99)

## 2016-05-11 LAB — CBC
HEMATOCRIT: 26.5 % — AB (ref 40.0–52.0)
Hemoglobin: 8.9 g/dL — ABNORMAL LOW (ref 13.0–18.0)
MCH: 30.2 pg (ref 26.0–34.0)
MCHC: 33.6 g/dL (ref 32.0–36.0)
MCV: 89.9 fL (ref 80.0–100.0)
PLATELETS: 104 10*3/uL — AB (ref 150–440)
RBC: 2.95 MIL/uL — ABNORMAL LOW (ref 4.40–5.90)
RDW: 14 % (ref 11.5–14.5)
WBC: 6.8 10*3/uL (ref 3.8–10.6)

## 2016-05-11 LAB — URINE CULTURE

## 2016-05-11 LAB — BASIC METABOLIC PANEL
ANION GAP: 5 (ref 5–15)
BUN: 27 mg/dL — ABNORMAL HIGH (ref 6–20)
CALCIUM: 9 mg/dL (ref 8.9–10.3)
CO2: 25 mmol/L (ref 22–32)
CREATININE: 1.24 mg/dL (ref 0.61–1.24)
Chloride: 109 mmol/L (ref 101–111)
GFR, EST AFRICAN AMERICAN: 59 mL/min — AB (ref >60)
GFR, EST NON AFRICAN AMERICAN: 51 mL/min — AB (ref >60)
Glucose, Bld: 149 mg/dL — ABNORMAL HIGH (ref 65–99)
Potassium: 4.2 mmol/L (ref 3.5–5.1)
SODIUM: 139 mmol/L (ref 135–145)

## 2016-05-11 NOTE — Progress Notes (Signed)
SUBJECTIVE: Patient denies any chest pain or shortness of breath and is more alert   Filed Vitals:   05/10/16 1112 05/10/16 1639 05/10/16 1925 05/11/16 0557  BP: 103/44 145/56 122/50 141/48  Pulse: 55 69 70 72  Temp: 97.8 F (36.6 C)  98.4 F (36.9 C) 98.2 F (36.8 C)  TempSrc: Oral  Oral Oral  Resp: 16  18 18   Height:      Weight:      SpO2: 93% 93% 96% 96%    Intake/Output Summary (Last 24 hours) at 05/11/16 0856 Last data filed at 05/11/16 0546  Gross per 24 hour  Intake    240 ml  Output   1950 ml  Net  -1710 ml    LABS: Basic Metabolic Panel:  Recent Labs  05/10/16 0403 05/11/16 0631  NA 135 139  K 4.4 4.2  CL 105 109  CO2 23 25  GLUCOSE 129* 149*  BUN 30* 27*  CREATININE 1.62* 1.24  CALCIUM 8.6* 9.0   Liver Function Tests:  Recent Labs  05/09/16 1103 05/10/16 0403  AST 29 39  ALT 17 19  ALKPHOS 56 50  BILITOT 1.2 1.0  PROT 6.1* 5.6*  ALBUMIN 3.7 3.2*    Recent Labs  05/09/16 1103  LIPASE 19   CBC:  Recent Labs  05/09/16 1103 05/10/16 0403 05/11/16 0631  WBC 13.4* 10.8* 6.8  NEUTROABS 12.4*  --   --   HGB 10.6* 9.6* 8.9*  HCT 32.1* 28.0* 26.5*  MCV 88.9 91.0 89.9  PLT 144* 108* 104*   Cardiac Enzymes:  Recent Labs  05/09/16 1636 05/09/16 2204 05/10/16 0403  TROPONINI 2.86* 2.57* 1.99*   BNP: Invalid input(s): POCBNP D-Dimer: No results for input(s): DDIMER in the last 72 hours. Hemoglobin A1C: No results for input(s): HGBA1C in the last 72 hours. Fasting Lipid Panel: No results for input(s): CHOL, HDL, LDLCALC, TRIG, CHOLHDL, LDLDIRECT in the last 72 hours. Thyroid Function Tests: No results for input(s): TSH, T4TOTAL, T3FREE, THYROIDAB in the last 72 hours.  Invalid input(s): FREET3 Anemia Panel: No results for input(s): VITAMINB12, FOLATE, FERRITIN, TIBC, IRON, RETICCTPCT in the last 72 hours.   PHYSICAL EXAM General: Well developed, well nourished, in no acute distress HEENT:  Normocephalic and  atramatic Neck:  No JVD.  Lungs: Clear bilaterally to auscultation and percussion. Heart: HRRR . Normal S1 and S2 without gallops or murmurs.  Abdomen: Bowel sounds are positive, abdomen soft and non-tender  Msk:  Back normal, normal gait. Normal strength and tone for age. Extremities: No clubbing, cyanosis or edema.   Neuro: Alert and oriented X 3. Psych:  Good affect, responds appropriately  TELEMETRY: Sinus rhythm  ASSESSMENT AND PLAN: Patient had elevated dysfunction on echocardiogram and has all his grafts patent on cardiac catheterization but has some distal small vessel disease. If surgery is not going to be done advise adding Plavix tomorrow. But patient is probably low risk for surgery if Dr. Pat Patrick wants to do surgery.  Principal Problem:   NSTEMI (non-ST elevated myocardial infarction) ([place]) Active Problems:   COPD (chronic obstructive pulmonary disease) ([name])   Abdominal pain, bilateral upper quadrant   S/P CABG (coronary artery bypass graft)   Calculus of gallbladder with acute cholecystitis without obstruction    [name] A, MD, [facility] 05/11/2016 8:56 AM

## 2016-05-11 NOTE — Progress Notes (Signed)
CC: cholecystitis Subjective: Feeling better, no abd paion, tolerating PO, no N/V Echo 40 % EF pulm htn  Objective: Vital signs in last 24 hours: Temp:  [98.2 F (36.8 C)-98.4 F (36.9 C)] 98.2 F (36.8 C) (06/13 0557) Pulse Rate:  [67-72] 67 (06/13 0935) Resp:  [18] 18 (06/13 0557) BP: (122-145)/(48-56) 133/52 mmHg (06/13 0935) SpO2:  [93 %-96 %] 96 % (06/13 0557) Last BM Date: 05/10/16  Intake/Output from previous day: 06/12 0701 - 06/13 0700 In: 240 [P.O.:240] Out: 1950 [Urine:1950] Intake/Output this shift: Total I/O In: 600 [P.O.:600] Out: -   Physical exam: NAD, obese debilitated male Abd: soft, NT, no murphy Ext: well perfused  Lab Results: CBC   Recent Labs  05/10/16 0403 05/11/16 0631  WBC 10.8* 6.8  HGB 9.6* 8.9*  HCT 28.0* 26.5*  PLT 108* 104*   BMET  Recent Labs  05/10/16 0403 05/11/16 0631  NA 135 139  K 4.4 4.2  CL 105 109  CO2 23 25  GLUCOSE 129* 149*  BUN 30* 27*  CREATININE 1.62* 1.24  CALCIUM 8.6* 9.0   PT/INR  Recent Labs  05/09/16 1103  LABPROT 15.0  INR 1.16   ABG No results for input(s): PHART, HCO3 in the last 72 hours.  Invalid input(s): PCO2, PO2  Studies/Results: No results found.  Anti-infectives: Anti-infectives    Start     Dose/Rate Route Frequency Ordered Stop   05/10/16 0800  piperacillin-tazobactam (ZOSYN) IVPB 3.375 g     3.375 g 12.5 mL/hr over 240 Minutes Intravenous Every 8 hours 05/10/16 0753     05/09/16 1100  levofloxacin (LEVAQUIN) IVPB 750 mg     750 mg 100 mL/hr over 90 Minutes Intravenous  Once 05/09/16 1056 05/09/16 1408      Assessment/Plan: Acute on chronic cholecystitis responding to medical rx May switch to oral cipro and flagyl and will need 2 week outpt rx No surgical indication at this time May f/u as outpt and we will revisit the feasibility of chole once he is optimized from medical perspective D/w pt and family in detail  Caroleen Hamman, MD, [facility]  05/11/2016

## 2016-05-11 NOTE — Progress Notes (Signed)
Patient has rested quietly today with no complaints. One assist with walker to bathroom - steady on his feet. Sat up for meals. Family at bedside most of the day. Will continue to monitor.

## 2016-05-11 NOTE — Progress Notes (Signed)
[place] [facility] [place] NAME: Justin Leonard    MR#:  [id number]  DATE OF BIRTH:  05-31-1927  SUBJECTIVE:   Doing well this morning. No abdominal pain or fevers overnight  REVIEW OF SYSTEMS:    Review of Systems  Constitutional: Negative for fever, chills and malaise/fatigue.  HENT: Negative for ear discharge, ear pain, hearing loss, nosebleeds and sore throat.   Eyes: Negative for blurred vision and pain.  Respiratory: Negative for cough, hemoptysis, shortness of breath and wheezing.   Cardiovascular: Negative for chest pain, palpitations and leg swelling.  Gastrointestinal: Negative for nausea, vomiting, abdominal pain, diarrhea and blood in stool.  Genitourinary: Negative for dysuria.  Musculoskeletal: Negative for back pain.  Neurological: Negative for dizziness, tremors, speech change, focal weakness, seizures, weakness and headaches.  Endo/Heme/Allergies: Does not bruise/bleed easily.  Psychiatric/Behavioral: Negative for depression, suicidal ideas and hallucinations.    Tolerating Diet: yes      DRUG ALLERGIES:  No Known Allergies  VITALS:  Blood pressure 133/52, pulse 67, temperature 98.2 F (36.8 C), temperature source Oral, resp. rate 18, height 6\' 2"  (1.88 m), weight 100.472 kg (221 lb 8 oz), SpO2 96 %.  PHYSICAL EXAMINATION:   Physical Exam  Constitutional: He is oriented to person, place, and time and well-developed, well-nourished, and in no distress. No distress.  HENT:  Head: Normocephalic.  Eyes: No scleral icterus.  Neck: Normal range of motion. Neck supple. No JVD present. No tracheal deviation present.  Cardiovascular: Normal rate and regular rhythm.  Exam reveals no gallop and no friction rub.   Murmur heard. Pulmonary/Chest: Effort normal and breath sounds normal. No respiratory distress. He has no wheezes. He has no rales. He exhibits no tenderness.  Abdominal: Soft. Bowel sounds are normal. He exhibits no  distension and no mass. There is no tenderness. There is no rebound and no guarding.  Musculoskeletal: Normal range of motion. He exhibits no edema.  Neurological: He is alert and oriented to person, place, and time.  Skin: Skin is warm. No rash noted. No erythema.  Psychiatric: Affect and judgment normal.      LABORATORY PANEL:   CBC  Recent Labs Lab 05/11/16 0631  WBC 6.8  HGB 8.9*  HCT 26.5*  PLT 104*   ------------------------------------------------------------------------------------------------------------------  Chemistries   Recent Labs Lab 05/10/16 0403 05/11/16 0631  NA 135 139  K 4.4 4.2  CL 105 109  CO2 23 25  GLUCOSE 129* 149*  BUN 30* 27*  CREATININE 1.62* 1.24  CALCIUM 8.6* 9.0  AST 39  --   ALT 19  --   ALKPHOS 50  --   BILITOT 1.0  --    ------------------------------------------------------------------------------------------------------------------  Cardiac Enzymes  Recent Labs Lab 05/09/16 1636 05/09/16 2204 05/10/16 0403  TROPONINI 2.86* 2.57* 1.99*   ------------------------------------------------------------------------------------------------------------------  RADIOLOGY:  US Abdomen Limited Ruq  05/09/2016  CLINICAL DATA:  Right upper quadrant abdominal tenderness for the past 2 days. Bilious vomiting. EXAM: US ABDOMEN LIMITED - RIGHT UPPER QUADRANT COMPARISON:  11/07/2015 and abdomen CT dated 11/06/2015. FINDINGS: Gallbladder: None uniform wall thickening with areas that are more focally thickened without a discrete mass visualized. Two small, non mobile gallstones. The largest measures 5 mm in maximum diameter. The patient was not focally tender over the gallbladder. No pericholecystic fluid. Common bile duct: Diameter: 6.0 mm Liver: No focal lesion identified. Within normal limits in parenchymal echogenicity. Other:  Minimal free peritoneal fluid adjacent to the liver. IMPRESSION: 1. Cholelithiasis.  2. Nonuniform gallbladder wall  thickening. This is most likely due to chronic cholecystitis. 3. Minimal free peritoneal fluid. Electronically Signed   By: Claudie Revering M.D.   On: 05/09/2016 12:10     ASSESSMENT AND PLAN:   82 year old male with a past medical history significant for ASCVD status post CABG and stents who presented with elevated troponin with chest pain and abdominal pain.  1. Elevation in troponin: Patient underwent cardiac catheterization which shows patent grafts. He had moderate to severe stenosis in OM.Marland Kitchen Discussed with Dr.Khan. He does not believe diagnosis to be non-STEMI. The patient troponin due to demand ischemia from cholecystitis. Echo shows ejection fraction 40% with moderate pulmonary hypertension. Patient will need close outpatient follow-up at discharge..  2. Acute on chronic cholecystitis: Continue Zosyn. I spoke with Dr Arther Abbott. Patient at very high risk for any surgical procedures. At this time surgery is not recommended. Continue IV antibiotics through today and consider switching to oral in the next 24-36 hours.  3. Diabetes: Continue sliding scale insulin, Lantus and ADA diet.   4. ASCVD: Continue aspirin, metoprolol and pravastatin.  5. COPD: Patient does not appear to be in exacerbation. Continue oxygen    Management plans discussed with the patient and he is in agreement. D/w family Patient ambulating does not need physical therapy consultation.  CODE STATUS: FULL  TOTAL TIME TAKING CARE OF THIS PATIENT: 25 minutes.     POSSIBLE D/C 1-2 days, DEPENDING ON CLINICAL CONDITION.   Kensie Susman M.D on 05/11/2016 at 11:28 AM  Between 7am to 6pm - Pager - [phone] After 6pm go to [email or web address] - password [email or web address] [facility]  Office  [phone]  CC: Primary care physician; [name], MD  Note: This dictation was prepared with Dragon dictation along with smaller phrase technology. Any transcriptional errors that result from this process  are unintentional.

## 2016-05-12 LAB — GLUCOSE, CAPILLARY
GLUCOSE-CAPILLARY: 111 mg/dL — AB (ref 65–99)
Glucose-Capillary: 144 mg/dL — ABNORMAL HIGH (ref 65–99)

## 2016-05-12 LAB — COMPREHENSIVE METABOLIC PANEL
ALK PHOS: 59 U/L (ref 38–126)
ALT: 20 U/L (ref 17–63)
AST: 21 U/L (ref 15–41)
Albumin: 3.1 g/dL — ABNORMAL LOW (ref 3.5–5.0)
Anion gap: 5 (ref 5–15)
BUN: 18 mg/dL (ref 6–20)
CALCIUM: 9.4 mg/dL (ref 8.9–10.3)
CO2: 27 mmol/L (ref 22–32)
CREATININE: 1.17 mg/dL (ref 0.61–1.24)
Chloride: 109 mmol/L (ref 101–111)
GFR, EST NON AFRICAN AMERICAN: 55 mL/min — AB (ref >60)
Glucose, Bld: 127 mg/dL — ABNORMAL HIGH (ref 65–99)
Potassium: 4.1 mmol/L (ref 3.5–5.1)
Sodium: 141 mmol/L (ref 135–145)
Total Bilirubin: 0.6 mg/dL (ref 0.3–1.2)
Total Protein: 5.8 g/dL — ABNORMAL LOW (ref 6.5–8.1)

## 2016-05-12 LAB — CBC
HCT: 27.8 % — ABNORMAL LOW (ref 40.0–52.0)
Hemoglobin: 9.5 g/dL — ABNORMAL LOW (ref 13.0–18.0)
MCH: 30.3 pg (ref 26.0–34.0)
MCHC: 34.1 g/dL (ref 32.0–36.0)
MCV: 88.9 fL (ref 80.0–100.0)
PLATELETS: 122 10*3/uL — AB (ref 150–440)
RBC: 3.13 MIL/uL — AB (ref 4.40–5.90)
RDW: 13.9 % (ref 11.5–14.5)
WBC: 6.5 10*3/uL (ref 3.8–10.6)

## 2016-05-12 MED ORDER — CLOPIDOGREL BISULFATE 75 MG PO TABS
75.0000 mg | ORAL_TABLET | Freq: Every day | ORAL | Status: DC
  Administered 2016-05-12: 75 mg via ORAL
  Filled 2016-05-12: qty 1

## 2016-05-12 MED ORDER — METRONIDAZOLE 250 MG PO TABS: 250.0000 mg | ORAL_TABLET | Freq: Three times a day (TID) | ORAL | Status: DC

## 2016-05-12 MED ORDER — CLOPIDOGREL BISULFATE 75 MG PO TABS: 75.0000 mg | ORAL_TABLET | Freq: Every day | ORAL | Status: DC

## 2016-05-12 MED ORDER — CIPROFLOXACIN HCL 500 MG PO TABS: 500.0000 mg | ORAL_TABLET | Freq: Two times a day (BID) | ORAL | Status: DC

## 2016-05-12 NOTE — Progress Notes (Signed)
Pt. Discharged to home via wc. Discharge instructions and medication regimen reviewed at bedside with patient, spouse and son. All verbalize understanding of instructions and medication regimen. Prescriptions included with d/c papers. Patient assessment unchanged from this morning. TELE and IV discontinued per policy.

## 2016-05-12 NOTE — Discharge Summary (Signed)
[place] at [place] NAME: Justin Leonard    MR#:  [id number]  DATE OF BIRTH:  May 15, 1927  DATE OF ADMISSION:  05/09/2016 ADMITTING PHYSICIAN: Idelle Crouch, MD  DATE OF DISCHARGE: 05/12/2016  PRIMARY CARE PHYSICIAN: [name], MD    ADMISSION DIAGNOSIS:  Dyspnea [R06.00] NSTEMI (non-ST elevated myocardial infarction) ([place]) [I21.4] Abdominal pain [R10.9]  DISCHARGE DIAGNOSIS:  Principal Problem:   NSTEMI (non-ST elevated myocardial infarction) ([place]) Active Problems:   COPD (chronic obstructive pulmonary disease) ([name])   Abdominal pain, bilateral upper quadrant   S/P CABG (coronary artery bypass graft)   Calculus of gallbladder with acute cholecystitis without obstruction   SECONDARY DIAGNOSIS:   Past Medical History  Diagnosis Date  . CHF (congestive heart failure) ([place])   . Hypertension   . Diabetes mellitus without complication ([place])   . Coronary artery disease   . Hyperlipidemia   . Deafness   . Peripheral neuropathy ([place])   . Back pain   . Umbilical hernia   . Depression   . Melena   . COPD (chronic obstructive pulmonary disease) ([place])   . Obesity   . Peripheral neuropathy ([name])     bil.hands and feet  . Skin cancer     HOSPITAL COURSE:   1. Elevation in troponin: Patient underwent cardiac catheterization which shows patent grafts. He had moderate to severe stenosis in OM.Marland Kitchen Have Chronic systolic and diastolic CHF per Echo. Discussed with Dr.Khan. He does not believe diagnosis to be non-STEMI. The patient troponin due to demand ischemia from cholecystitis. Echo shows ejection fraction 40% with moderate pulmonary hypertension. Patient will need close outpatient follow-up at discharge..  2. Acute on chronic cholecystitis: Continue Zosyn.  At this time surgery is not recommended. Surgery suggested to give oral abx for 2 weeks and then follow up in office in 2 weeks.  3. Diabetes: Continue  sliding scale insulin, Lantus and ADA diet.  4. ASCVD: Continue aspirin, metoprolol and pravastatin.  5. COPD: Patient does not appear to be in exacerbation. Continue oxygen   DISCHARGE CONDITIONS:   Stable.  CONSULTS OBTAINED:  Treatment Team:  Dia Crawford III, MD [name], MD  DRUG ALLERGIES:  No Known Allergies  DISCHARGE MEDICATIONS:   Current Discharge Medication List    START taking these medications   Details  ciprofloxacin (CIPRO) 500 MG tablet Take 1 tablet (500 mg total) by mouth 2 (two) times daily. Qty: 28 tablet, Refills: 0    clopidogrel (PLAVIX) 75 MG tablet Take 1 tablet (75 mg total) by mouth daily. Qty: 30 tablet, Refills: 0    metroNIDAZOLE (FLAGYL) 250 MG tablet Take 1 tablet (250 mg total) by mouth 3 (three) times daily. Qty: 42 tablet, Refills: 0      CONTINUE these medications which have NOT CHANGED   Details  acetaminophen (TYLENOL) 325 MG tablet Take 650 mg by mouth every 6 (six) hours as needed. Reported on 12/16/2015    aspirin EC 81 MG tablet Take 81 mg by mouth daily.    ferrous sulfate 325 (65 FE) MG tablet Take 325 mg by mouth daily with breakfast.    FLUoxetine (PROZAC) 20 MG capsule Take 20 mg by mouth daily.    insulin glargine (LANTUS) 100 UNIT/ML injection Inject 12 Units into the skin at bedtime.     lovastatin (MEVACOR) 20 MG tablet Take 20 mg by mouth at bedtime.    metFORMIN (GLUCOPHAGE) 500 MG tablet Take 500  mg by mouth 3 (three) times daily.    metoprolol succinate (TOPROL-XL) 25 MG 24 hr tablet Take 25 mg by mouth daily.    nitroGLYCERIN (NITROSTAT) 0.4 MG SL tablet Place 1 tablet under the tongue every 5 (five) minutes as needed. Reported on 12/16/2015    pantoprazole (PROTONIX) 40 MG tablet Take 40 mg by mouth daily.     Polyethylene Glycol 3350 (MIRALAX PO) Take 17 g by mouth daily.    tamsulosin (FLOMAX) 0.4 MG CAPS capsule Take 1 capsule (0.4 mg total) by mouth daily after supper. Qty: 30 capsule, Refills:  12    Tiotropium Bromide Monohydrate (SPIRIVA RESPIMAT) 2.5 MCG/ACT AERS Inhale 2 puffs into the lungs See admin instructions. Inhale 2 puffs 1 to 2 times a day.    VENTOLIN HFA 108 (90 BASE) MCG/ACT inhaler Inhale 1-2 puffs into the lungs as needed. Reported on 12/16/2015    furosemide (LASIX) 40 MG tablet Take 1 tablet (40 mg total) by mouth daily. Qty: 10 tablet, Refills: 0    senna-docusate (SENOKOT-S) 8.6-50 MG tablet Take 2 tablets by mouth 2 (two) times daily. Qty: 30 tablet, Refills: 0         DISCHARGE INSTRUCTIONS:    Follow with surgery clinic in 2 weeks.  If you experience worsening of your admission symptoms, develop shortness of breath, life threatening emergency, suicidal or homicidal thoughts you must seek medical attention immediately by calling 911 or calling your MD immediately  if symptoms less severe.  You Must read complete instructions/literature along with all the possible adverse reactions/side effects for all the Medicines you take and that have been prescribed to you. Take any new Medicines after you have completely understood and accept all the possible adverse reactions/side effects.   Please note  You were cared for by a hospitalist during your hospital stay. If you have any questions about your discharge medications or the care you received while you were in the hospital after you are discharged, you can call the unit and asked to speak with the hospitalist on call if the hospitalist that took care of you is not available. Once you are discharged, your primary care physician will handle any further medical issues. Please note that NO REFILLS for any discharge medications will be authorized once you are discharged, as it is imperative that you return to your primary care physician (or establish a relationship with a primary care physician if you do not have one) for your aftercare needs so that they can reassess your need for medications and monitor your lab  values.    Today   CHIEF COMPLAINT:   Chief Complaint  Patient presents with  . Nausea  . Emesis    HISTORY OF PRESENT ILLNESS:  Justin Leonard  is a 82 y.o. male with a known history of ASCVD s/p CABG with subsequent stent placements now with recurrent episodes of lower chest and upper abdominal pain with worsening SOB and nausea. No vomiting or diarrhea. Had recent gallbladder issues. Presented to ER where EKG showed no acute chanbges but troponin elevated c/w NSTEMI. He is now admitted. No fever.   VITAL SIGNS:  Blood pressure 146/57, pulse 63, temperature 97.7 F (36.5 C), temperature source Oral, resp. rate 18, height 6\' 2"  (1.88 m), weight 98.158 kg (216 lb 6.4 oz), SpO2 96 %.  I/O:   Intake/Output Summary (Last 24 hours) at 05/12/16 1156 Last data filed at 05/12/16 0943  Gross per 24 hour  Intake   1040 ml  Output   1650 ml  Net   -610 ml    PHYSICAL EXAMINATION:   Constitutional: He is oriented to person, place, and time and well-developed, well-nourished, and in no distress. No distress.  HENT:  Head: Normocephalic.  Eyes: No scleral icterus.  Neck: Normal range of motion. Neck supple. No JVD present. No tracheal deviation present.  Cardiovascular: Normal rate and regular rhythm. Exam reveals no gallop and no friction rub.  Murmur heard. Pulmonary/Chest: Effort normal and breath sounds normal. No respiratory distress. He has no wheezes. He has no rales. He exhibits no tenderness.  Abdominal: Soft. Bowel sounds are normal. He exhibits no distension and no mass. There is no tenderness. There is no rebound and no guarding.  Musculoskeletal: Normal range of motion. He exhibits no edema.  Neurological: He is alert and oriented to person, place, and time.  Skin: Skin is warm. No rash noted. No erythema.  Psychiatric: Affect and judgment normal.   DATA REVIEW:   CBC  Recent Labs Lab 05/12/16 0432  WBC 6.5  HGB 9.5*  HCT 27.8*  PLT 122*    Chemistries    Recent Labs Lab 05/12/16 0432  NA 141  K 4.1  CL 109  CO2 27  GLUCOSE 127*  BUN 18  CREATININE 1.17  CALCIUM 9.4  AST 21  ALT 20  ALKPHOS 59  BILITOT 0.6    Cardiac Enzymes  Recent Labs Lab 05/10/16 0403  TROPONINI 1.99*    Microbiology Results  Results for orders placed or performed during the hospital encounter of 05/09/16  Blood culture (routine single)     Status: None (Preliminary result)   Collection Time: 05/09/16 11:03 AM  Result Value Ref Range Status   Specimen Description BLOOD RIGHT ARM  Final   Special Requests BOTTLES DRAWN AEROBIC AND ANAEROBIC  2CC  Final   Culture NO GROWTH 3 DAYS  Final   Report Status PENDING  Incomplete  Urine culture     Status: Abnormal   Collection Time: 05/09/16  6:45 PM  Result Value Ref Range Status   Specimen Description URINE, RANDOM  Final   Special Requests NONE  Final   Culture MULTIPLE SPECIES PRESENT, SUGGEST RECOLLECTION (A)  Final   Report Status 05/11/2016 FINAL  Final    RADIOLOGY:  No results found.  EKG:   Orders placed or performed during the hospital encounter of 05/09/16  . EKG 12-Lead  . EKG 12-Lead      Management plans discussed with the patient, family and they are in agreement.  CODE STATUS: full.    Code Status Orders        Start     Ordered   05/09/16 1618  Full code   Continuous     05/09/16 1617    Code Status History    Date Active Date Inactive Code Status Order ID Comments User Context   11/05/2015  6:38 AM 11/14/2015  7:39 PM Full Code [id number]  Harrie Foreman, MD Inpatient   07/17/2015  4:14 PM 07/18/2015  4:39 PM Full Code [id number]  [name], MD Inpatient   07/17/2015  3:41 PM 07/17/2015  4:14 PM Full Code [id number]  [name], MD Inpatient   07/15/2015  5:04 PM 07/17/2015  3:41 PM Full Code [id number]  Demetrios Loll, MD Inpatient    Advance Directive Documentation        Most Recent Value   Type of Advance Directive  Healthcare Power of [place]  Pre-existing out of facility DNR order (yellow form or pink MOST form)     "MOST" Form in Place?        TOTAL TIME TAKING CARE OF THIS PATIENT: 35 minutes.    Vaughan Basta M.D on 05/12/2016 at 11:56 AM  Between 7am to 6pm - Pager - [phone]  After 6pm go to [email or web address] - password [email or web address] [facility]  Office  [phone]  CC: Primary care physician; [name], MD   Note: This dictation was prepared with Dragon dictation along with smaller phrase technology. Any transcriptional errors that result from this process are unintentional.

## 2016-05-12 NOTE — Progress Notes (Signed)
SUBJECTIVE: Patient is awake and alert, resting in bed. He denies chest pain or shortness of breath. His son is at the bedside and involved in his care.   Filed Vitals:   05/11/16 0935 05/11/16 1928 05/12/16 0535 05/12/16 0805  BP: 133/52 146/55 155/56 143/64  Pulse: 67 70 63 53  Temp:  98.2 F (36.8 C) 97.8 F (36.6 C)   TempSrc:  Oral Oral   Resp:  18 18   Height:      Weight:   216 lb 6.4 oz (98.158 kg)   SpO2:  93% 95%     Intake/Output Summary (Last 24 hours) at 05/12/16 0851 Last data filed at 05/12/16 0554  Gross per 24 hour  Intake   1090 ml  Output   1650 ml  Net   -560 ml    LABS: Basic Metabolic Panel:  Recent Labs  05/11/16 0631 05/12/16 0432  NA 139 141  K 4.2 4.1  CL 109 109  CO2 25 27  GLUCOSE 149* 127*  BUN 27* 18  CREATININE 1.24 1.17  CALCIUM 9.0 9.4   Liver Function Tests:  Recent Labs  05/10/16 0403 05/12/16 0432  AST 39 21  ALT 19 20  ALKPHOS 50 59  BILITOT 1.0 0.6  PROT 5.6* 5.8*  ALBUMIN 3.2* 3.1*    Recent Labs  05/09/16 1103  LIPASE 19   CBC:  Recent Labs  05/09/16 1103  05/11/16 0631 05/12/16 0432  WBC 13.4*  < > 6.8 6.5  NEUTROABS 12.4*  --   --   --   HGB 10.6*  < > 8.9* 9.5*  HCT 32.1*  < > 26.5* 27.8*  MCV 88.9  < > 89.9 88.9  PLT 144*  < > 104* 122*  < > = values in this interval not displayed. Cardiac Enzymes:  Recent Labs  05/09/16 1636 05/09/16 2204 05/10/16 0403  TROPONINI 2.86* 2.57* 1.99*   BNP: Invalid input(s): POCBNP D-Dimer: No results for input(s): DDIMER in the last 72 hours. Hemoglobin A1C: No results for input(s): HGBA1C in the last 72 hours. Fasting Lipid Panel: No results for input(s): CHOL, HDL, LDLCALC, TRIG, CHOLHDL, LDLDIRECT in the last 72 hours. Thyroid Function Tests: No results for input(s): TSH, T4TOTAL, T3FREE, THYROIDAB in the last 72 hours.  Invalid input(s): FREET3 Anemia Panel: No results for input(s): VITAMINB12, FOLATE, FERRITIN, TIBC, IRON, RETICCTPCT in  the last 72 hours.   PHYSICAL EXAM General: Well developed, well nourished, in no acute distress HEENT:  Normocephalic and atramatic Neck:  No JVD.  Lungs: Clear bilaterally to auscultation and percussion. Heart: HRRR . Normal S1 and S2 without gallops or murmurs.  Abdomen: Bowel sounds are positive, abdomen soft and non-tender  Msk:  Back normal, normal gait. Normal strength and tone for age. Extremities: No clubbing, cyanosis or edema.   Neuro: Alert and oriented X 3. Psych:  Good affect, responds appropriately  TELEMETRY: Sinus bradycardia/sinus rhythm with rate 59-60's  ASSESSMENT AND PLAN: Patient has LV dysfunction with EF 40%, diffuse hypokinesis and right ventricular systolic pressure elevated indicating moderate pulmonary hypertension on echocardiogram and has all his grafts patent on cardiac catheterization but has some distal small vessel disease. The patient has gallbladder disease and has been decided that he will receive antibiotic therapy and defer surgery at this time. Plavix has been initiated.  The patient should follow-up in the office upon discharge I have given him an appointment for Tuesday at 10:00 at [facility] to see Dr. Humphrey Rolls.   Marland Kitchen  Principal Problem:   NSTEMI (non-ST elevated myocardial infarction) ([place]) Active Problems:   COPD (chronic obstructive pulmonary disease) ([name])   Abdominal pain, bilateral upper quadrant   S/P CABG (coronary artery bypass graft)   Calculus of gallbladder with acute cholecystitis without obstruction    Daune Perch, NP 05/12/2016 8:51 AM

## 2016-05-14 LAB — CULTURE, BLOOD (SINGLE): Culture: NO GROWTH

## 2016-05-17 ENCOUNTER — Other Ambulatory Visit: Payer: Self-pay

## 2016-05-20 ENCOUNTER — Ambulatory Visit (INDEPENDENT_AMBULATORY_CARE_PROVIDER_SITE_OTHER): Payer: Medicare Other | Admitting: Surgery

## 2016-05-20 ENCOUNTER — Encounter: Payer: Self-pay | Admitting: Surgery

## 2016-05-20 VITALS — BP 126/53 | HR 66 | Temp 98.3°F | Ht 75.0 in | Wt 220.0 lb

## 2016-05-20 DIAGNOSIS — K8020 Calculus of gallbladder without cholecystitis without obstruction: Secondary | ICD-10-CM

## 2016-05-20 NOTE — Patient Instructions (Signed)
We will call you with your appointment for September.  Please schedule an appointment with your Pulmonologist.  Please don't forget your appointment with your Cardiologist.

## 2016-05-21 NOTE — Progress Notes (Signed)
Outpatient Surgical Follow Up  05/21/2016  Justin Leonard is an 82 y.o. male.   Chief Complaint  Patient presents with  . Follow-up    Chronic Cholecystitis    HPI: 82 year old well-known patient to practice with restrictive chronic cholecystitis with recurrent episodes most recent was a couple weeks ago where he required admission to hospital and was treated with antibiotics. At some point in time he did have a cholecystostomy tube at that time he was sake and was not a surgical candidate. During the last hospitalization he was evaluated by cardiology and a cardiac catheter was performed initially his troponins went up but cardiology determined that there was no evidence of myocardial infarction but rather troponin leak from the mild ischemia. He is cardiac catheter showed no evidence of reversible or stent to both lesions. Medical management was recommended. He also has a history of diabetes, CHF, COPD and wears oxygen only at night. He walks with a walker and today he came walking on his own without any dyspnea. From his abdominal perspective he is doing well and has responded appropriately with antibiotics. He is tolerating regular diet and denies any right upper quadrant pain and no evidence of obstructive jaundice   Past Medical History  Diagnosis Date  . CHF (congestive heart failure) ([place])   . Hypertension   . Diabetes mellitus without complication ([place])   . Coronary artery disease   . Hyperlipidemia   . Deafness   . Peripheral neuropathy ([place])   . Back pain   . Umbilical hernia   . Depression   . Melena   . COPD (chronic obstructive pulmonary disease) ([place])   . Obesity   . Peripheral neuropathy ([name])     bil.hands and feet  . Skin cancer     Past Surgical History  Procedure Laterality Date  . Coronary artery bypass graft    . Cardiac catheterization Left 07/17/2015    Procedure: Right/Left Heart Cath and Coronary Angiography;  Surgeon: [name], MD;  Location:  [place] [facility];  Service: Cardiovascular;  Laterality: Left;  . Cardiac catheterization N/A 07/17/2015    Procedure: Coronary Stent Intervention;  Surgeon: [name], MD;  Location: [place] [facility];  Service: Cardiovascular;  Laterality: N/A;  . Coronary angioplasty    . Esophagogastroduodenoscopy N/A 11/10/2015    Procedure: ESOPHAGOGASTRODUODENOSCOPY (EGD);  Surgeon: [name], MD;  Location: [facility]OS[facility];  Service: Endoscopy;  Laterality: N/A;  . Gall bladder drain    . Colonoscopy with propofol N/A 12/12/2015    Procedure: COLONOSCOPY WITH PROPOFOL;  Surgeon: [name], MD;  Location: [facility] ENDOSCOPY;  Service: Gastroenterology;  Laterality: N/A;  . Cardiac catheterization Right 05/10/2016    Procedure: Left Heart Cath and Coronary Angiography;  Surgeon: [name], MD;  Location: [place] [facility];  Service: Cardiovascular;  Laterality: Right;    Family History  Problem Relation Age of Onset  . Anemia Neg Hx   . Arrhythmia Neg Hx   . Asthma Neg Hx   . Clotting disorder Neg Hx   . Fainting Neg Hx   . Heart attack Neg Hx   . Heart disease Neg Hx   . Heart failure Neg Hx   . Hyperlipidemia Neg Hx   . Hypertension Father   . CVA Father     Social History:  reports that he quit smoking about 38 years ago. His smoking use included Cigarettes. He has a 90 pack-year smoking history. He has  never used smokeless tobacco. He reports that he does not drink alcohol or use illicit drugs.  Allergies: No Known Allergies  Medications reviewed.    ROS  10 pt ROS was negative  BP 126/53 mmHg  Pulse 66  Temp(Src) 98.3 F (36.8 C) (Oral)  Ht 6\' 3"  (1.905 m)  Wt 99.791 kg (220 lb)  BMI 27.50 kg/m2  Physical Exam Elderly male in NAD. I actually walking in my office for a distance of 100 yards he walked with a cane and did not develop any shortness of breath or chest pain at the end of the walk with a measure his sats and it was 90% on room air Chest: S1,s2, no  murmurs, Lungs CTA Abd: soft, NT, no murphy, no peritonitis Ext: some mild pedal edema, ext well perfused   No results found for this or any previous visit (from the past 48 hour(s)). No results found.  Assessment/Plan:  1. Calculus of gallbladder with chronic cholecystitis without obstruction This [age]-year-old with recurrent episodes of cholecystitis and with multiple medical issues including coronary artery disease status post CABG and stent, no recent need for revascularization and seen by cardiology who cleared him from their cardiac standpoint. She does have diabetes, and COPD that I think that need to be optimized before considering any elective intervention. Patient is very frustrated because he wishes to have his gallbladder out so we can prevent future recurrent attacks. I did explain to him that the first thing we needed to do is to optimize him from a medical perspective before considering any surgical revision. The times that we have seeing him he has not been optimized and hives he has had other medical issues that have excluded him from surgical intervention. The first Order of business is to optimize him from medical perspective and that includes pulmonary eval from COPD. Apparently from the cardiac perspective he is cleared. Had a lengthy discussion with the patient and his wife and explained to them that surgical intervention carries its own risk especially given all his comorbidities and his risk factors and the fact that he is 82 years old. I want to see him back in 2 months reevaluate him and a half pulmonary optimization before even considering any surgical intervention. If we can avoid an operation him I think that it'll be on his best interest. Again difficult situation because recurrent episode of cholecystitis can definitely impact both his quality of life and can be intentionally be fatal given all his comorbidities. Not really a good solution in this case. I spent about 40  minutes in this encounter including counseling, reviewing records and images studies.     Caroleen Hamman, MD FACS General Surgeon  05/21/2016,8:52 AM

## 2016-05-27 ENCOUNTER — Emergency Department
Admission: EM | Admit: 2016-05-27 | Discharge: 2016-05-27 | Disposition: A | Discharge status: Home / Self Care | Payer: Medicare Other | Attending: Emergency Medicine | Admitting: Emergency Medicine

## 2016-05-27 ENCOUNTER — Emergency Department: Payer: Medicare Other

## 2016-05-27 ENCOUNTER — Other Ambulatory Visit: Payer: Self-pay

## 2016-05-27 ENCOUNTER — Encounter: Payer: Self-pay | Admitting: Emergency Medicine

## 2016-05-27 DIAGNOSIS — E119 Type 2 diabetes mellitus without complications: Secondary | ICD-10-CM | POA: Insufficient documentation

## 2016-05-27 DIAGNOSIS — R0602 Shortness of breath: Secondary | ICD-10-CM | POA: Diagnosis not present

## 2016-05-27 DIAGNOSIS — Z87891 Personal history of nicotine dependence: Secondary | ICD-10-CM | POA: Insufficient documentation

## 2016-05-27 DIAGNOSIS — Z7902 Long term (current) use of antithrombotics/antiplatelets: Secondary | ICD-10-CM | POA: Insufficient documentation

## 2016-05-27 DIAGNOSIS — I5032 Chronic diastolic (congestive) heart failure: Secondary | ICD-10-CM | POA: Insufficient documentation

## 2016-05-27 DIAGNOSIS — I110 Hypertensive heart disease with heart failure: Secondary | ICD-10-CM | POA: Insufficient documentation

## 2016-05-27 DIAGNOSIS — E785 Hyperlipidemia, unspecified: Secondary | ICD-10-CM | POA: Diagnosis not present

## 2016-05-27 DIAGNOSIS — J449 Chronic obstructive pulmonary disease, unspecified: Secondary | ICD-10-CM | POA: Insufficient documentation

## 2016-05-27 DIAGNOSIS — F329 Major depressive disorder, single episode, unspecified: Secondary | ICD-10-CM | POA: Diagnosis not present

## 2016-05-27 DIAGNOSIS — R079 Chest pain, unspecified: Secondary | ICD-10-CM | POA: Diagnosis present

## 2016-05-27 DIAGNOSIS — R0789 Other chest pain: Secondary | ICD-10-CM | POA: Diagnosis not present

## 2016-05-27 DIAGNOSIS — Z7982 Long term (current) use of aspirin: Secondary | ICD-10-CM | POA: Diagnosis not present

## 2016-05-27 DIAGNOSIS — Z7984 Long term (current) use of oral hypoglycemic drugs: Secondary | ICD-10-CM | POA: Diagnosis not present

## 2016-05-27 DIAGNOSIS — Z85828 Personal history of other malignant neoplasm of skin: Secondary | ICD-10-CM | POA: Insufficient documentation

## 2016-05-27 DIAGNOSIS — Z79899 Other long term (current) drug therapy: Secondary | ICD-10-CM | POA: Diagnosis not present

## 2016-05-27 DIAGNOSIS — I252 Old myocardial infarction: Secondary | ICD-10-CM | POA: Insufficient documentation

## 2016-05-27 DIAGNOSIS — I2510 Atherosclerotic heart disease of native coronary artery without angina pectoris: Secondary | ICD-10-CM | POA: Insufficient documentation

## 2016-05-27 DIAGNOSIS — Z951 Presence of aortocoronary bypass graft: Secondary | ICD-10-CM | POA: Insufficient documentation

## 2016-05-27 LAB — BASIC METABOLIC PANEL
Anion gap: 9 (ref 5–15)
BUN: 22 mg/dL — AB (ref 6–20)
CHLORIDE: 104 mmol/L (ref 101–111)
CO2: 26 mmol/L (ref 22–32)
Calcium: 10.3 mg/dL (ref 8.9–10.3)
Creatinine, Ser: 1.26 mg/dL — ABNORMAL HIGH (ref 0.61–1.24)
GFR calc non Af Amer: 50 mL/min — ABNORMAL LOW (ref >60)
GFR, EST AFRICAN AMERICAN: 58 mL/min — AB (ref >60)
Glucose, Bld: 120 mg/dL — ABNORMAL HIGH (ref 65–99)
POTASSIUM: 4.9 mmol/L (ref 3.5–5.1)
SODIUM: 139 mmol/L (ref 135–145)

## 2016-05-27 LAB — CBC
HEMATOCRIT: 33.4 % — AB (ref 40.0–52.0)
Hemoglobin: 11.4 g/dL — ABNORMAL LOW (ref 13.0–18.0)
MCH: 29.9 pg (ref 26.0–34.0)
MCHC: 34.2 g/dL (ref 32.0–36.0)
MCV: 87.6 fL (ref 80.0–100.0)
Platelets: 289 10*3/uL (ref 150–440)
RBC: 3.81 MIL/uL — AB (ref 4.40–5.90)
RDW: 14 % (ref 11.5–14.5)
WBC: 6.9 10*3/uL (ref 3.8–10.6)

## 2016-05-27 LAB — PROTIME-INR
INR: 1.07
Prothrombin Time: 14.1 seconds (ref 11.4–15.0)

## 2016-05-27 LAB — TROPONIN I: Troponin I: <0.03 ng/mL (ref <0.03)

## 2016-05-27 MED ORDER — ISOSORBIDE MONONITRATE ER 30 MG PO TB24: 30.0000 mg | ORAL_TABLET | Freq: Every day | ORAL | Status: DC

## 2016-05-27 MED ORDER — ISOSORBIDE MONONITRATE ER 30 MG PO TB24
30.0000 mg | ORAL_TABLET | Freq: Every day | ORAL | Status: DC
  Administered 2016-05-27: 30 mg via ORAL
  Filled 2016-05-27: qty 1

## 2016-05-27 NOTE — ED Provider Notes (Signed)
[facility] Emergency Department Provider Note        Time seen: ----------------------------------------- 1:51 PM on 05/27/2016 -----------------------------------------    I have reviewed the triage vital signs and the nursing notes.   HISTORY  Chief Complaint Chest Pain    HPI Justin Leonard is a 82 y.o. male who presents ER with left-sided chest pain since 12:30.Patient states he was waiting for his wife in the parking lot and began having chest pain. He took 3 nitroglycerin sublingually and this is starting to ease off his pain. Currently he is not having any chest pain. He presents sweaty and anxious with heavy breathing. Recently admitted the hospital for dyspnea, recently had a cardiac catheterization that revealed a blockage distal to his stent.   Past Medical History  Diagnosis Date  . CHF (congestive heart failure) ([place])   . Hypertension   . Diabetes mellitus without complication ([place])   . Coronary artery disease   . Hyperlipidemia   . Deafness   . Peripheral neuropathy ([place])   . Back pain   . Umbilical hernia   . Depression   . Melena   . COPD (chronic obstructive pulmonary disease) ([place])   . Obesity   . Peripheral neuropathy ([name])     bil.hands and feet  . Skin cancer     Patient Active Problem List   Diagnosis Date Noted  . Calculus of gallbladder with acute cholecystitis without obstruction   . NSTEMI (non-ST elevated myocardial infarction) ([place]) 05/09/2016  . Abdominal pain, bilateral upper quadrant 05/09/2016  . S/P CABG (coronary artery bypass graft) 05/09/2016  . Bleeding gastrointestinal   . Diabetic hypoglycemia ([place]) 10/13/2015  . Bradycardia 07/30/2015  . Elevated troponin 07/15/2015  . Chronic diastolic heart failure ([place]) 04/17/2015  . HTN (hypertension) 04/17/2015  . Diabetes ([place]) 04/17/2015  . COPD (chronic obstructive pulmonary disease) ([place]) 04/17/2015  . Clinical depression 08/23/2012  . Melanoma in  situ ([place]) 08/23/2012  . Exomphalos 08/23/2012  . Major depressive disorder with single episode ([place]) 08/23/2012  . Difficulty hearing 01/18/2012  . HLD (hyperlipidemia) 01/18/2012  . Peripheral nerve disease ([place]) 01/18/2012  . Adiposity 01/18/2012    Past Surgical History  Procedure Laterality Date  . Coronary artery bypass graft    . Cardiac catheterization Left 07/17/2015    Procedure: Right/Left Heart Cath and Coronary Angiography;  Surgeon: [name], MD;  Location: [place] [facility];  Service: Cardiovascular;  Laterality: Left;  . Cardiac catheterization N/A 07/17/2015    Procedure: Coronary Stent Intervention;  Surgeon: [name], MD;  Location: [place] [facility];  Service: Cardiovascular;  Laterality: N/A;  . Coronary angioplasty    . Esophagogastroduodenoscopy N/A 11/10/2015    Procedure: ESOPHAGOGASTRODUODENOSCOPY (EGD);  Surgeon: [name], MD;  Location: [facility] ENDOSCOPY;  Service: Endoscopy;  Laterality: N/A;  . Gall bladder drain    . Colonoscopy with propofol N/A 12/12/2015    Procedure: COLONOSCOPY WITH PROPOFOL;  Surgeon: [name], MD;  Location: [facility] ENDOSCOPY;  Service: Gastroenterology;  Laterality: N/A;  . Cardiac catheterization Right 05/10/2016    Procedure: Left Heart Cath and Coronary Angiography;  Surgeon: [name], MD;  Location: [place] [facility];  Service: Cardiovascular;  Laterality: Right;    Allergies Review of patient's allergies indicates no known allergies.  Social History Social History  Substance Use Topics  . Smoking status: Former Smoker -- 3.00 packs/day for 30 years    Types: Cigarettes    Quit  date: 04/16/1978  . Smokeless tobacco: Never Used     Comment: Quit smoking 1979  . Alcohol Use: No    Review of Systems Constitutional: Negative for fever. Cardiovascular: Positive for chest pain Respiratory: Positive shortness of breath Gastrointestinal: Negative for abdominal pain, vomiting and  diarrhea. Genitourinary: Negative for dysuria. Musculoskeletal: Negative for back pain. Skin: Positive for sweats Neurological: Negative for headaches, focal weakness or numbness.  10-point ROS otherwise negative.  ____________________________________________   PHYSICAL EXAM:  VITAL SIGNS: ED Triage Vitals  Enc Vitals Group     BP 05/27/16 1317 146/72 mmHg     Pulse Rate 05/27/16 1317 74     Resp 05/27/16 1317 22     Temp 05/27/16 1317 98 F (36.7 C)     Temp Source 05/27/16 1317 Oral     SpO2 05/27/16 1317 99 %     Weight 05/27/16 1317 220 lb (99.791 kg)     Height 05/27/16 1317 5\' 10"  (1.778 m)     Head Cir --      Peak Flow --      Pain Score 05/27/16 1317 3     Pain Loc --      Pain Edu? --      Excl. in [place]? --    Constitutional: Alert and oriented. Anxious, mild distress Eyes: Conjunctivae are normal. PERRL. Normal extraocular movements. ENT   Head: Normocephalic and atraumatic.   Nose: No congestion/rhinnorhea.   Mouth/Throat: Mucous membranes are moist.   Neck: No stridor. Cardiovascular: Normal rate, regular rhythm. No murmurs, rubs, or gallops. Respiratory: Normal respiratory effort without tachypnea nor retractions. Breath sounds are clear and equal bilaterally. No wheezes/rales/rhonchi. Gastrointestinal: Soft and nontender. Normal bowel sounds Musculoskeletal: Nontender with normal range of motion in all extremities. No lower extremity tenderness nor edema. Neurologic:  Normal speech and language. No gross focal neurologic deficits are appreciated.  Skin:  Skin is warm, dry and intact. No rash noted. Psychiatric: Mood and affect are normal. Speech and behavior are normal.  ____________________________________________  EKG: Interpreted by me. Likely sinus rhythm with small P waves, PACs, right bundle branch block, normal axis.  ____________________________________________  ED COURSE:  Pertinent labs & imaging results that were available  during my care of the patient were reviewed by me and considered in my medical decision making (see chart for details). Patient was chest pain relieved by nitroglycerin. I will check cardiac labs and discuss with cardiology. ____________________________________________    LABS (pertinent positives/negatives)  Labs Reviewed  BASIC METABOLIC PANEL - Abnormal; Notable for the following:    Glucose, Bld 120 (*)    BUN 22 (*)    Creatinine, Ser 1.26 (*)    GFR calc non Af Amer 50 (*)    GFR calc Af Amer 58 (*)    All other components within normal limits  CBC - Abnormal; Notable for the following:    RBC 3.81 (*)    Hemoglobin 11.4 (*)    HCT 33.4 (*)    All other components within normal limits  TROPONIN I  PROTIME-INR    RADIOLOGY Chest x-ray IMPRESSION: No active cardiopulmonary disease.  Mildly enlarged cardiac silhouette.  ____________________________________________  FINAL ASSESSMENT AND PLAN  Chest pain  Plan: Patient with labs and imaging as dictated above. Patient presents with chest pain relieved with nitroglycerin, I have discussed with his cardiologist and reviewed his recent heart catheterization. Patient was started on isosorbide 30 mg, he will be medically managed. He currently is feeling better,  will be seen by cardiology at 11:30 in the morning.   Earleen Newport, MD   Note: This dictation was prepared with Dragon dictation. Any transcriptional errors that result from this process are unintentional   Earleen Newport, MD 05/27/16 1446

## 2016-05-27 NOTE — ED Notes (Signed)
Pt presents with left sided cp since about 1230. States he was waiting for his wife in the parking lot and he began to have pain. States that he took 3 nitroglycerin SL and still has pain. Pt sweating and anxious, with heavy breathing. Prior cardiac cath and cabg.

## 2016-05-27 NOTE — Discharge Instructions (Signed)
Angina Pectoris  Angina pectoris, often called angina, is extreme discomfort in the chest, neck, or arm. This is caused by a lack of blood in the middle and thickest layer of the heart wall (myocardium). There are four types of angina:  · Stable angina. Stable angina usually occurs in episodes of predictable frequency and duration. It is usually brought on by physical activity, stress, or excitement. Stable angina usually lasts a few minutes and can often be relieved by a medicine that you place under your tongue. This medicine is called sublingual nitroglycerin.  · Unstable angina. Unstable angina can occur even when you are doing little or no physical activity. It can even occur while you are sleeping or when you are at rest. It can suddenly increase in severity or frequency. It may not be relieved by sublingual nitroglycerin, and it can last up to 30 minutes.  · Microvascular angina. This type of angina is caused by a disorder of tiny blood vessels called arterioles. Microvascular angina is more common in women. The pain may be more severe and last longer than other types of angina pectoris.  · Prinzmetal or variant angina. This type of angina pectoris is rare and usually occurs when you are doing little or no physical activity. It especially occurs in the early morning hours.  CAUSES  Atherosclerosis is the cause of angina. This is the buildup of fat and cholesterol (plaque) on the inside of the arteries. Over time, the plaque may narrow or block the artery, and this will lessen blood flow to the heart. Plaque can also become weak and break off within a coronary artery to form a clot and cause a sudden blockage.  RISK FACTORS  Risk factors common to both men and women include:  · High cholesterol levels.  · High blood pressure (hypertension).  · Tobacco use.  · Diabetes.  · Family history of angina.  · Obesity.  · Lack of exercise.  · A diet high in saturated fats.  Women are at greater risk for angina if they  are:  · Over age 55.  · Postmenopausal.  SYMPTOMS  Many people do not experience any symptoms during the early stages of angina. As the condition progresses, symptoms common to both men and women may include:  · Chest pain.    The pain can be described as a crushing or squeezing in the chest, or a tightness, pressure, fullness, or heaviness in the chest.    The pain can last more than a few minutes, or it can stop and recur.  · Pain in the arms, neck, jaw, or back.  · Unexplained heartburn or indigestion.  · Shortness of breath.  · Nausea.  · Sudden cold sweats.  · Sudden light-headedness.  Many women have chest discomfort and some of the other symptoms. However, women often have different (atypical) symptoms, such as:   · Fatigue.  · Unexplained feelings of nervousness or anxiety.  · Unexplained weakness.  · Dizziness or fainting.  Sometimes, women may have angina without any symptoms.  DIAGNOSIS   Tests to diagnose angina may include:  · ECG (electrocardiogram).  · Exercise stress test. This looks for signs of blockage when the heart is being exercised.  · Pharmacologic stress test. This test looks for signs of blockage when the heart is being stressed with a medicine.  · Blood tests.  · Coronary angiogram. This is a procedure to look at the coronary arteries to see if there is any blockage.    TREATMENT   The treatment of angina may include the following:  · Healthy behavioral changes to reduce or control risk factors.  · Medicine.  · Coronary stenting. A stent helps to keep an artery open.  · Coronary angioplasty. This procedure widens a narrowed or blocked artery.  · Coronary artery bypass surgery. This will allow your blood to pass the blockage (bypass) to reach your heart.  HOME CARE INSTRUCTIONS   · Take medicines only as directed by your health care provider.  · Do not take the following medicines unless your health care provider approves:    Nonsteroidal anti-inflammatory drugs (NSAIDs), such as ibuprofen,  naproxen, or celecoxib.    Vitamin supplements that contain vitamin A, vitamin E, or both.    Hormone replacement therapy that contains estrogen with or without progestin.  · Manage other health conditions such as hypertension and diabetes as directed by your health care provider.  · Follow a heart-healthy diet. A dietitian can help to educate you about healthy food options and changes.  · Use healthy cooking methods such as roasting, grilling, broiling, baking, poaching, steaming, or stir-frying. Talk to a dietitian to learn more about healthy cooking methods.  · Follow an exercise program approved by your health care provider.  · Maintain a healthy weight. Lose weight as approved by your health care provider.  · Plan rest periods when fatigued.  · Learn to manage stress.  · Do not use any tobacco products, including cigarettes, chewing tobacco, or electronic cigarettes. If you need help quitting, ask your health care provider.  · If you drink alcohol, and your health care provider approves, limit your alcohol intake to no more than 1 drink per day. One drink equals 12 ounces of beer, 5 ounces of wine, or 1½ ounces of hard liquor.  · Stop illegal drug use.  · Keep all follow-up visits as directed by your health care provider. This is important.  SEEK IMMEDIATE MEDICAL CARE IF:   · You have pain in your chest, neck, arm, jaw, stomach, or back that lasts more than a few minutes, is recurring, or is unrelieved by taking sublingual nitroglycerin.  · You have profuse sweating without cause.  · You have unexplained:    Heartburn or indigestion.    Shortness of breath or difficulty breathing.    Nausea or vomiting.    Fatigue.    Feelings of nervousness or anxiety.    Weakness.    Diarrhea.  · You have sudden light-headedness or dizziness.  · You faint.  These symptoms may represent a serious problem that is an emergency. Do not wait to see if the symptoms will go away. Get medical help right away. Call your local  emergency services (911 in the [place] Do not drive yourself to the hospital.     This information is not intended to replace advice given to you by your health care provider. Make sure you discuss any questions you have with your health care provider.     Document Released: 11/15/2005 Document Revised: 12/06/2014 Document Reviewed: 03/19/2014  [employer]vier Interactive Patient Education ©2016 [employer]

## 2016-06-16 ENCOUNTER — Ambulatory Visit: Payer: Medicare Other | Admitting: Urology

## 2016-06-21 ENCOUNTER — Encounter: Payer: Self-pay | Admitting: Urology

## 2016-06-21 ENCOUNTER — Ambulatory Visit (INDEPENDENT_AMBULATORY_CARE_PROVIDER_SITE_OTHER): Payer: Medicare Other | Admitting: Urology

## 2016-06-21 VITALS — BP 133/56 | HR 65 | Ht 75.0 in | Wt 217.0 lb

## 2016-06-21 DIAGNOSIS — N401 Enlarged prostate with lower urinary tract symptoms: Secondary | ICD-10-CM | POA: Diagnosis not present

## 2016-06-21 DIAGNOSIS — Z87448 Personal history of other diseases of urinary system: Secondary | ICD-10-CM

## 2016-06-21 DIAGNOSIS — Z87898 Personal history of other specified conditions: Secondary | ICD-10-CM

## 2016-06-21 DIAGNOSIS — N138 Other obstructive and reflux uropathy: Secondary | ICD-10-CM

## 2016-06-21 NOTE — Progress Notes (Signed)
3:45 PM   JALENE DIBLASI [birth year] [id number]  Referring provider: [name], MD [address] [place], [place] [address]  Chief Complaint  Patient presents with  . Benign Prostatic Hypertrophy    2month    HPI: Patient is 82 year old Caucasian male with no previous urologic history presenting today for voiding trial after an acute episode of urinary retention after recent admission on 11/12/15 for sepsis and acute cholecystitis. A Foley catheter was placed during his admission with retention volume noted 900 cc. He denies any other previous urinary symptoms other than weak stream. He has never seen a urologist before and has not been on medications for an enlarged prostate. His CT scan did show a very large prostate protruding into his bladder. He also had some AKI which seemed to be resolving during admission and was attributed to his urinary retention. He was started on Flomax and presents today for catheter removal and voiding trial.  11/27/15 Cr 0.88  Current Status: His IPSS score today is 12, which is moderate lower urinary tract symptomatology. He is mixed with his quality life due to his urinary symptoms.  His major complaint today frequency and nocturia x 2-3.  He has had these symptoms for several years.  He denies any dysuria, hematuria or suprapubic pain.  He currently taking tamsulosin 0.4 mg daily.  He also denies any recent fevers, chills, nausea or vomiting.  He does not have a family history of PCa.      IPSS    Row Name 06/21/16 1500         International Prostate Symptom Score   How often have you had the sensation of not emptying your bladder? Not at All     How often have you had to urinate less than every two hours? About half the time     How often have you found you stopped and started again several times when you urinated? Less than half the time     How often have you found it difficult to postpone urination? Less than half the time     How  often have you had a weak urinary stream? About half the time     How often have you had to strain to start urination? Not at All     How many times did you typically get up at night to urinate? 2 Times     Total IPSS Score 12       Quality of Life due to urinary symptoms   If you were to spend the rest of your life with your urinary condition just the way it is now how would you feel about that? Mixed        Score:  1-7 Mild 8-19 Moderate 20-35 Severe   PMH: Past Medical History:  Diagnosis Date  . Back pain   . CHF (congestive heart failure) ([place])   . COPD (chronic obstructive pulmonary disease) ([place])   . Coronary artery disease   . Deafness   . Depression   . Diabetes mellitus without complication ([place])   . Hyperlipidemia   . Hypertension   . Melena   . Obesity   . Peripheral neuropathy ([place])   . Peripheral neuropathy ([name])    bil.hands and feet  . Skin cancer   . Umbilical hernia     Surgical History: Past Surgical History:  Procedure Laterality Date  . CARDIAC CATHETERIZATION Left 07/17/2015   Procedure: Right/Left Heart Cath and Coronary Angiography;  Surgeon:  [name], MD;  Location: [place] [facility];  Service: Cardiovascular;  Laterality: Left;  . CARDIAC CATHETERIZATION N/A 07/17/2015   Procedure: Coronary Stent Intervention;  Surgeon: [name], MD;  Location: [facility];  Service: Cardiovascular;  Laterality: N/A;  . CARDIAC CATHETERIZATION Right 05/10/2016   Procedure: Left Heart Cath and Coronary Angiography;  Surgeon: [name], MD;  Location: [facility];  Service: Cardiovascular;  Laterality: Right;  . COLONOSCOPY WITH PROPOFOL N/A 12/12/2015   Procedure: COLONOSCOPY WITH PROPOFOL;  Surgeon: [name], MD;  Location: [facility] ENDOSCOPY;  Service: Gastroenterology;  Laterality: N/A;  . CORONARY ANGIOPLASTY    . CORONARY ARTERY BYPASS GRAFT    . ESOPHAGOGASTRODUODENOSCOPY N/A 11/10/2015   Procedure:  ESOPHAGOGASTRODUODENOSCOPY (EGD);  Surgeon: [name], MD;  Location: [facility] ENDOSCOPY;  Service: Endoscopy;  Laterality: N/A;  . gall bladder drain      Home Medications:    Medication List       Accurate as of 06/21/16  3:45 PM. Always use your most recent med list.          clopidogrel 75 MG tablet Commonly known as:  PLAVIX Take 1 tablet (75 mg total) by mouth daily.   ENTRESTO 24-26 MG Generic drug:  sacubitril-valsartan Take 1 tablet by mouth 2 (two) times daily.   FLUoxetine 20 MG capsule Commonly known as:  PROZAC Take 20 mg by mouth daily.   furosemide 40 MG tablet Commonly known as:  LASIX Take 1 tablet (40 mg total) by mouth daily.   insulin glargine 100 UNIT/ML injection Commonly known as:  LANTUS Inject 12 Units into the skin at bedtime.   isosorbide mononitrate 30 MG 24 hr tablet Commonly known as:  IMDUR Take 1 tablet (30 mg total) by mouth daily.   lovastatin 20 MG tablet Commonly known as:  MEVACOR Take 20 mg by mouth at bedtime.   metFORMIN 500 MG tablet Commonly known as:  GLUCOPHAGE Take 500 mg by mouth 3 (three) times daily.   metoprolol succinate 25 MG 24 hr tablet Commonly known as:  TOPROL-XL Take 25 mg by mouth daily.   MIRALAX PO Take 17 g by mouth daily.   nitroGLYCERIN 0.4 MG SL tablet Commonly known as:  NITROSTAT Place 1 tablet under the tongue every 5 (five) minutes as needed. Reported on 12/16/2015   pantoprazole 40 MG tablet Commonly known as:  PROTONIX Take 40 mg by mouth daily.   ranolazine 500 MG 12 hr tablet Commonly known as:  RANEXA Take 500 mg by mouth 2 (two) times daily.   SPIRIVA RESPIMAT 2.5 MCG/ACT Aers Generic drug:  Tiotropium Bromide Monohydrate Inhale 2 puffs into the lungs See admin instructions. Inhale 2 puffs 1 to 2 times a day.   tamsulosin 0.4 MG Caps capsule Commonly known as:  FLOMAX Take 1 capsule (0.4 mg total) by mouth daily after supper.   VENTOLIN HFA 108 (90 Base) MCG/ACT  inhaler Generic drug:  albuterol Inhale 1-2 puffs into the lungs as needed. Reported on 12/16/2015       Allergies: No Known Allergies  Family History: Family History  Problem Relation Age of Onset  . Hypertension Father   . CVA Father   . Anemia Neg Hx   . Arrhythmia Neg Hx   . Asthma Neg Hx   . Clotting disorder Neg Hx   . Fainting Neg Hx   . Heart attack Neg Hx   . Heart disease Neg Hx   .  Heart failure Neg Hx   . Hyperlipidemia Neg Hx     Social History:  reports that he quit smoking about 38 years ago. His smoking use included Cigarettes. He has a 90.00 pack-year smoking history. He has never used smokeless tobacco. He reports that he does not drink alcohol or use drugs.  ROS: UROLOGY Frequent Urination?: Yes Hard to postpone urination?: No Burning/pain with urination?: No Get up at night to urinate?: Yes Leakage of urine?: Yes Urine stream starts and stops?: No Trouble starting stream?: No Do you have to strain to urinate?: No Blood in urine?: No Urinary tract infection?: No Sexually transmitted disease?: No Injury to kidneys or bladder?: No Painful intercourse?: No Weak stream?: No Erection problems?: No Penile pain?: No  Gastrointestinal Nausea?: No Vomiting?: No Indigestion/heartburn?: No Diarrhea?: No Constipation?: No  Constitutional Fever: No Night sweats?: No Weight loss?: Yes Fatigue?: Yes  Skin Skin rash/lesions?: Yes Itching?: Yes  Eyes Blurred vision?: Yes Double vision?: No  Ears/Nose/Throat Sore throat?: No Sinus problems?: No  Hematologic/Lymphatic Swollen glands?: No Easy bruising?: Yes  Cardiovascular Leg swelling?: Yes Chest pain?: No  Respiratory Cough?: No Shortness of breath?: No  Endocrine Excessive thirst?: No  Musculoskeletal Back pain?: Yes Joint pain?: No  Neurological Headaches?: No Dizziness?: Yes  Psychologic Depression?: Yes Anxiety?: No  Physical Exam: BP (!) 133/56   Pulse 65   Ht  6\' 3"  (1.905 m)   Wt 217 lb (98.4 kg)   BMI 27.12 kg/m   Constitutional: Well nourished. Alert and oriented, No acute distress. HEENT: [place] AT, moist mucus membranes. Trachea midline, no masses. Cardiovascular: No clubbing, cyanosis, or edema. Respiratory: Normal respiratory effort, no increased work of breathing. GI: Abdomen is soft, non tender, non distended, no abdominal masses. Liver and spleen not palpable.  No hernias appreciated.  Stool sample for occult testing is not indicated.   GU: No CVA tenderness.  No bladder fullness or masses.  Patient with uncircumcised phallus. Foreskin easily retracted  Urethral meatus is patent.  No penile discharge. No penile lesions or rashes. Scrotum without lesions, cysts, rashes and/or edema.  Testicles are located scrotally bilaterally. No masses are appreciated in the testicles. Left and right epididymis are normal. Rectal: Patient with  normal sphincter tone. Anus and perineum without scarring or rashes. No rectal masses are appreciated. Prostate is approximately 55 grams, no nodules are appreciated. Seminal vesicles are normal. Skin: No rashes, bruises or suspicious lesions. Lymph: No cervical or inguinal adenopathy. Neurologic: Grossly intact, no focal deficits, moving all 4 extremities. Psychiatric: Normal mood and affect.  Laboratory Data:  Results for orders placed or performed during the hospital encounter of [id number]  Basic metabolic panel  Result Value Ref Range   Sodium 139 135 - 145 mmol/L   Potassium 4.9 3.5 - 5.1 mmol/L   Chloride 104 101 - 111 mmol/L   CO2 26 22 - 32 mmol/L   Glucose, Bld 120 (H) 65 - 99 mg/dL   BUN 22 (H) 6 - 20 mg/dL   Creatinine, Ser 1.26 (H) 0.61 - 1.24 mg/dL   Calcium 10.3 8.9 - 10.3 mg/dL   GFR calc non Af Amer 50 (L) >60 mL/min   GFR calc Af Amer 58 (L) >60 mL/min   Anion gap 9 5 - 15  CBC  Result Value Ref Range   WBC 6.9 3.8 - 10.6 K/uL   RBC 3.81 (L) 4.40 - 5.90 MIL/uL   Hemoglobin 11.4 (L) 13.0 -  18.0 g/dL   HCT  33.4 (L) 40.0 - 52.0 %   MCV 87.6 80.0 - 100.0 fL   MCH 29.9 26.0 - 34.0 pg   MCHC 34.2 32.0 - 36.0 g/dL   RDW 14.0 11.5 - 14.5 %   Platelets 289 150 - 440 K/uL  Troponin I  Result Value Ref Range   Troponin I <0.03 <0.03 ng/mL  Protime-INR (order if Patient is taking Coumadin / Warfarin)  Result Value Ref Range   Prothrombin Time 14.1 11.4 - 15.0 seconds   INR 1.07      Assessment & Plan:   1. Urinary retention-  Patient reports that he has been voiding well.    2. BPH with LUTS  - IPSS score is 12/3  - Continue tamsulosin 0.4 mg daily  - RTC in 12 months for IPSS and exam   Return in about 1 year (around 06/21/2017) for IPSS and exam.  These notes generated with voice recognition software. I apologize for typographical errors.  Zara Council, [facility] [address] [place], [place] [address] [phone]

## 2016-07-20 ENCOUNTER — Telehealth: Payer: Self-pay

## 2016-07-20 NOTE — Telephone Encounter (Signed)
Called patient and left a voicemail to return my call back.

## 2016-07-22 NOTE — Telephone Encounter (Signed)
Patient has not returned my call.   Dr. Dahlia Byes saw the patient two months ago and had stated that he wanted to follow up with the patient to see if he would be a surgical candidate by then. I had sent myself a message to contact patient. I've seen in patient's chart that he has not seen the cardiologist and pulmonologist as of yet. However, I spoke with Dr. Dahlia Byes and I asked if I obtained cardiac and pulmonologist clearance he would perform his surgery. Dr. Dahlia Byes stated that it was correct. Therefore, I went ahead and sent the forms to his physicians and now awaiting for their response.

## 2016-07-22 NOTE — Telephone Encounter (Signed)
Wayna Chalet, CMA  Wayna Chalet, [place] faxed cardiac clearance to his pulmonologist and cardiologist. Awaiting on response so I could schedule a H&P appointment with Dr. Dahlia Byes so we could schedule his surgery.   Previous Messages    ----- Message -----  From: [name], MD  Sent: 07/21/2016  3:21 PM  To: Wayna Chalet, CMA  Subject: RE: F.Y.I.                    yes  ----- Message -----  From: Wayna Chalet, CMA  Sent: 07/21/2016 12:00 PM  To: [name], MD  Subject: RE: F.Y.I.                    If I get clearance from cardiology and pulmonary, can I schedule a follow up appointment with you to schedule surgery?   ----- Message -----  From: [name], MD  Sent: 07/21/2016 10:20 AM  To: Wayna Chalet, CMA  Subject: RE: F.Y.I.                    Thx, u r right, he needs to see pulm before I consider surgery ( I think cardiology already wrote a note and they are ok with his having sx)  ----- Message -----  From: Wayna Chalet, CMA  Sent: 07/20/2016  3:15 PM  To: [name], MD  Subject: F.Y.I.                      Hello Dr. Dahlia Byes.  I wanted to let you know that you had seen this patient two months ago (05/20/2016). You wanted me to contact patient to schedule a follow up with you two months from 05/20/2016 to see if he could have surgery done. However, you wanted him to be seen by his cardiologist and pulmonologist. Well, looking into his chart, patient has not seen neither. I then called him and had to leave him a message to call me back. Still awaiting for his call.  I do know that you want him to see his cardiologist and pulmonologist. Waiting for him to return my call so I could either schedule those appts. Or he declines. I'll keep you informed.

## 2016-07-23 ENCOUNTER — Telehealth: Payer: Self-pay

## 2016-07-23 NOTE — Telephone Encounter (Signed)
Pulmonary Clearance obtained at this time from [place].

## 2016-07-23 NOTE — Telephone Encounter (Signed)
Patients wife has called and stated that patient had an appointment with Dr Chancy Milroy today (cardiology). Patient was taken off a blood pressure medication and will follow back up with Dr Chancy Milroy on 08/06/16. Dr Chancy Milroy will discuss clearance at that time.

## 2016-07-26 ENCOUNTER — Telehealth: Payer: Self-pay

## 2016-07-26 NOTE — Telephone Encounter (Signed)
Called Mr. Justin Leonard and had to leave a voicemail letting him know that we need to go ahead and schedule a follow up appointment with Dr. Dahlia Byes so we could discuss surgery.

## 2016-07-26 NOTE — Telephone Encounter (Signed)
Cardiac Clearance obtained at this time from [place].

## 2016-07-28 ENCOUNTER — Other Ambulatory Visit: Payer: Self-pay

## 2016-07-30 ENCOUNTER — Encounter: Payer: Self-pay | Admitting: Surgery

## 2016-07-30 ENCOUNTER — Ambulatory Visit (INDEPENDENT_AMBULATORY_CARE_PROVIDER_SITE_OTHER): Payer: Medicare Other | Admitting: Surgery

## 2016-07-30 VITALS — BP 114/44 | HR 70 | Temp 97.9°F | Wt 217.0 lb

## 2016-07-30 DIAGNOSIS — K8020 Calculus of gallbladder without cholecystitis without obstruction: Secondary | ICD-10-CM

## 2016-07-30 NOTE — Progress Notes (Signed)
Outpatient Surgical Follow Up  07/30/2016  Justin Leonard is an 82 y.o. male.   Chief Complaint  Patient presents with  . Follow-up    follow up with Dr Dahlia Byes to discuss surgery - chronic cholecystitis    HPI:   82 year old well-known patient to practice with  chronic cholecystitis with recurrent episodes most recent wasIn June when he required admission to hospital and was treated with antibiotics. At some point in time he did have a cholecystostomy tube at that time he was sake and was not a surgical candidate. During the last hospitalization he was evaluated by cardiology and a cardiac catheter was performed initially his troponins went up but cardiology determined that there was no evidence of myocardial infarction but rather troponin leak from the mild ischemia. He is cardiac catheter showed no evidence of reversible or stent to both lesions. Medical management was recommended. He also has a history of diabetes, CHF, COPD and wears oxygen only at night. He walks with a walker and today he came walking on his own without any dyspnea. From his abdominal perspective he is doing well and has responded appropriately with antibiotics. He is tolerating regular diet and denies any right upper quadrant pain and no evidence of obstructive jaundice He recently saw his cardiology and some adjustment of his diuretic and blood pressure medication was done. An since then apparently he has had lateral blood pressure and some difficulty with coordination and ambulation.. Again from abdominal perspective he denies any symptomatology. He was seen by pulmonary and no other recommendations were given.   Past Medical History:  Diagnosis Date  . Back pain   . CHF (congestive heart failure) ([place])   . COPD (chronic obstructive pulmonary disease) ([place])   . Coronary artery disease   . Deafness   . Depression   . Diabetes mellitus without complication ([place])   . Hyperlipidemia   . Hypertension   . Melena   .  Obesity   . Peripheral neuropathy ([place])   . Peripheral neuropathy ([name])    bil.hands and feet  . Skin cancer   . Umbilical hernia     Past Surgical History:  Procedure Laterality Date  . CARDIAC CATHETERIZATION Left 07/17/2015   Procedure: Right/Left Heart Cath and Coronary Angiography;  Surgeon: [name], MD;  Location: [place] [facility];  Service: Cardiovascular;  Laterality: Left;  . CARDIAC CATHETERIZATION N/A 07/17/2015   Procedure: Coronary Stent Intervention;  Surgeon: [name], MD;  Location: [facility];  Service: Cardiovascular;  Laterality: N/A;  . CARDIAC CATHETERIZATION Right 05/10/2016   Procedure: Left Heart Cath and Coronary Angiography;  Surgeon: [name], MD;  Location: [facility];  Service: Cardiovascular;  Laterality: Right;  . COLONOSCOPY WITH PROPOFOL N/A 12/12/2015   Procedure: COLONOSCOPY WITH PROPOFOL;  Surgeon: [name], MD;  Location: [facility] ENDOSCOPY;  Service: Gastroenterology;  Laterality: N/A;  . CORONARY ANGIOPLASTY    . CORONARY ARTERY BYPASS GRAFT    . ESOPHAGOGASTRODUODENOSCOPY N/A 11/10/2015   Procedure: ESOPHAGOGASTRODUODENOSCOPY (EGD);  Surgeon: [name], MD;  Location: [facility] ENDOSCOPY;  Service: Endoscopy;  Laterality: N/A;  . gall bladder drain      Family History  Problem Relation Age of Onset  . Hypertension Father   . CVA Father   . Anemia Neg Hx   . Arrhythmia Neg Hx   . Asthma Neg Hx   . Clotting disorder Neg Hx   . Fainting Neg Hx   .  Heart attack Neg Hx   . Heart disease Neg Hx   . Heart failure Neg Hx   . Hyperlipidemia Neg Hx     Social History:  reports that he quit smoking about 38 years ago. His smoking use included Cigarettes. He has a 90.00 pack-year smoking history. He has never used smokeless tobacco. He reports that he does not drink alcohol or use drugs.  Allergies: No Known Allergies  Medications reviewed.    ROS  For review of system performed and is otherwise negative  other than what is stated in the history of present illness  BP (!) 114/44   Pulse 70   Temp 97.9 F (36.6 C) (Oral)   Wt 98.4 kg (217 lb)   BMI 27.12 kg/m   Physical Exam Elderly and debilitated male in no acute distress. He is walking with a cane Neck: supple No JVD Chest: CTA, no wheezes CV: NSR, s,1,s2, no murmurs Abd: soft, NT, epigastric hernia, no murphy or peritonitis Ext: mild pedal edema, well perfused Neuro: awake alert, no motor or sen deficits    No results found for this or any previous visit (from the past 48 hour(s)). No results found.  Assessment/Plan: Previous history of recurrent episode of cholecystitis. He responded well to medical therapy. At this point he is deteriorating from his medical condition and his blood pressure is labile. We'll recommend optimization of medical therapy and to make sure his blood pressures are optimal. I do not recommend any surgical intervnetion at this time. I will like to see him in about 3 months and then at that time make a better determination about his functional status. Again no need for surgical indication at this time. Extensive counseling provided to the patient and to the family  Clayburn Pert, MD [facility] Surgeon  07/30/2016,4:37 PM

## 2016-09-17 ENCOUNTER — Telehealth: Payer: Self-pay

## 2016-09-17 NOTE — Telephone Encounter (Signed)
Cardiac Clearance obtained from [facility] on 07/22/16 and will be scanned under media.

## 2016-09-21 ENCOUNTER — Emergency Department: Payer: Medicare Other

## 2016-09-21 ENCOUNTER — Encounter: Payer: Self-pay | Admitting: Emergency Medicine

## 2016-09-21 ENCOUNTER — Observation Stay
Admission: EM | Admit: 2016-09-21 | Discharge: 2016-09-22 | Disposition: A | Discharge status: Home / Self Care | Payer: Medicare Other | Attending: Internal Medicine | Admitting: Internal Medicine

## 2016-09-21 DIAGNOSIS — J449 Chronic obstructive pulmonary disease, unspecified: Secondary | ICD-10-CM | POA: Diagnosis not present

## 2016-09-21 DIAGNOSIS — Z79899 Other long term (current) drug therapy: Secondary | ICD-10-CM | POA: Diagnosis not present

## 2016-09-21 DIAGNOSIS — I2510 Atherosclerotic heart disease of native coronary artery without angina pectoris: Secondary | ICD-10-CM | POA: Diagnosis not present

## 2016-09-21 DIAGNOSIS — H9190 Unspecified hearing loss, unspecified ear: Secondary | ICD-10-CM | POA: Insufficient documentation

## 2016-09-21 DIAGNOSIS — R079 Chest pain, unspecified: Secondary | ICD-10-CM | POA: Diagnosis not present

## 2016-09-21 DIAGNOSIS — E1142 Type 2 diabetes mellitus with diabetic polyneuropathy: Secondary | ICD-10-CM | POA: Insufficient documentation

## 2016-09-21 DIAGNOSIS — Z85828 Personal history of other malignant neoplasm of skin: Secondary | ICD-10-CM | POA: Diagnosis not present

## 2016-09-21 DIAGNOSIS — F329 Major depressive disorder, single episode, unspecified: Secondary | ICD-10-CM | POA: Insufficient documentation

## 2016-09-21 DIAGNOSIS — Z794 Long term (current) use of insulin: Secondary | ICD-10-CM | POA: Insufficient documentation

## 2016-09-21 DIAGNOSIS — E669 Obesity, unspecified: Secondary | ICD-10-CM | POA: Insufficient documentation

## 2016-09-21 DIAGNOSIS — Z955 Presence of coronary angioplasty implant and graft: Secondary | ICD-10-CM | POA: Diagnosis not present

## 2016-09-21 DIAGNOSIS — I700 Atherosclerosis of aorta: Secondary | ICD-10-CM | POA: Insufficient documentation

## 2016-09-21 DIAGNOSIS — Z951 Presence of aortocoronary bypass graft: Secondary | ICD-10-CM | POA: Insufficient documentation

## 2016-09-21 DIAGNOSIS — Z7902 Long term (current) use of antithrombotics/antiplatelets: Secondary | ICD-10-CM | POA: Insufficient documentation

## 2016-09-21 DIAGNOSIS — Z7982 Long term (current) use of aspirin: Secondary | ICD-10-CM | POA: Diagnosis not present

## 2016-09-21 DIAGNOSIS — I509 Heart failure, unspecified: Secondary | ICD-10-CM | POA: Diagnosis not present

## 2016-09-21 DIAGNOSIS — Z87891 Personal history of nicotine dependence: Secondary | ICD-10-CM | POA: Insufficient documentation

## 2016-09-21 DIAGNOSIS — Z6826 Body mass index (BMI) 26.0-26.9, adult: Secondary | ICD-10-CM | POA: Diagnosis not present

## 2016-09-21 DIAGNOSIS — Z9889 Other specified postprocedural states: Secondary | ICD-10-CM | POA: Diagnosis not present

## 2016-09-21 DIAGNOSIS — Z8249 Family history of ischemic heart disease and other diseases of the circulatory system: Secondary | ICD-10-CM | POA: Insufficient documentation

## 2016-09-21 DIAGNOSIS — K8000 Calculus of gallbladder with acute cholecystitis without obstruction: Secondary | ICD-10-CM | POA: Diagnosis not present

## 2016-09-21 DIAGNOSIS — I252 Old myocardial infarction: Secondary | ICD-10-CM | POA: Insufficient documentation

## 2016-09-21 DIAGNOSIS — Z823 Family history of stroke: Secondary | ICD-10-CM | POA: Diagnosis not present

## 2016-09-21 DIAGNOSIS — I110 Hypertensive heart disease with heart failure: Secondary | ICD-10-CM | POA: Diagnosis not present

## 2016-09-21 DIAGNOSIS — E785 Hyperlipidemia, unspecified: Secondary | ICD-10-CM | POA: Diagnosis not present

## 2016-09-21 LAB — CBC
HCT: 32 % — ABNORMAL LOW (ref 40.0–52.0)
Hemoglobin: 11.2 g/dL — ABNORMAL LOW (ref 13.0–18.0)
MCH: 32.1 pg (ref 26.0–34.0)
MCHC: 35 g/dL (ref 32.0–36.0)
MCV: 91.7 fL (ref 80.0–100.0)
PLATELETS: 189 10*3/uL (ref 150–440)
RBC: 3.49 MIL/uL — AB (ref 4.40–5.90)
RDW: 13.2 % (ref 11.5–14.5)
WBC: 6.6 10*3/uL (ref 3.8–10.6)

## 2016-09-21 LAB — BASIC METABOLIC PANEL
Anion gap: 6 (ref 5–15)
BUN: 24 mg/dL — ABNORMAL HIGH (ref 6–20)
CHLORIDE: 105 mmol/L (ref 101–111)
CO2: 27 mmol/L (ref 22–32)
CREATININE: 1.3 mg/dL — AB (ref 0.61–1.24)
Calcium: 9.9 mg/dL (ref 8.9–10.3)
GFR calc non Af Amer: 48 mL/min — ABNORMAL LOW (ref >60)
GFR, EST AFRICAN AMERICAN: 55 mL/min — AB (ref >60)
Glucose, Bld: 104 mg/dL — ABNORMAL HIGH (ref 65–99)
POTASSIUM: 4.3 mmol/L (ref 3.5–5.1)
SODIUM: 138 mmol/L (ref 135–145)

## 2016-09-21 LAB — HEPATIC FUNCTION PANEL
ALBUMIN: 3.8 g/dL (ref 3.5–5.0)
ALK PHOS: 85 U/L (ref 38–126)
ALT: 22 U/L (ref 17–63)
AST: 44 U/L — AB (ref 15–41)
BILIRUBIN TOTAL: 1.1 mg/dL (ref 0.3–1.2)
Bilirubin, Direct: 0.3 mg/dL (ref 0.1–0.5)
Indirect Bilirubin: 0.8 mg/dL (ref 0.3–0.9)
Total Protein: 6.6 g/dL (ref 6.5–8.1)

## 2016-09-21 LAB — GLUCOSE, CAPILLARY: Glucose-Capillary: 164 mg/dL — ABNORMAL HIGH (ref 65–99)

## 2016-09-21 LAB — TROPONIN I: Troponin I: <0.03 ng/mL (ref <0.03)

## 2016-09-21 MED ORDER — DIPHENHYDRAMINE HCL 25 MG PO CAPS
25.0000 mg | ORAL_CAPSULE | Freq: Every evening | ORAL | Status: DC | PRN
  Administered 2016-09-21: 25 mg via ORAL
  Filled 2016-09-21: qty 1

## 2016-09-21 MED ORDER — FLUOXETINE HCL 20 MG PO CAPS
20.0000 mg | ORAL_CAPSULE | Freq: Every day | ORAL | Status: DC
  Administered 2016-09-22: 20 mg via ORAL
  Filled 2016-09-21: qty 1

## 2016-09-21 MED ORDER — INSULIN ASPART 100 UNIT/ML ~~LOC~~ SOLN: 0.0000 [IU] | Freq: Every day | SUBCUTANEOUS | Status: DC

## 2016-09-21 MED ORDER — FUROSEMIDE 40 MG PO TABS
40.0000 mg | ORAL_TABLET | Freq: Every day | ORAL | Status: DC
  Administered 2016-09-22: 40 mg via ORAL
  Filled 2016-09-21: qty 1

## 2016-09-21 MED ORDER — TAMSULOSIN HCL 0.4 MG PO CAPS
0.4000 mg | ORAL_CAPSULE | Freq: Every day | ORAL | Status: DC
  Administered 2016-09-21: 0.4 mg via ORAL
  Filled 2016-09-21: qty 1

## 2016-09-21 MED ORDER — ALBUTEROL SULFATE (2.5 MG/3ML) 0.083% IN NEBU: 3.0000 mL | INHALATION_SOLUTION | RESPIRATORY_TRACT | Status: DC | PRN

## 2016-09-21 MED ORDER — INSULIN GLARGINE 100 UNIT/ML ~~LOC~~ SOLN
12.0000 [IU] | Freq: Every day | SUBCUTANEOUS | Status: DC
  Administered 2016-09-21: 12 [IU] via SUBCUTANEOUS
  Filled 2016-09-21 (×2): qty 0.12

## 2016-09-21 MED ORDER — RANOLAZINE ER 500 MG PO TB12
500.0000 mg | ORAL_TABLET | Freq: Two times a day (BID) | ORAL | Status: DC
  Administered 2016-09-21 – 2016-09-22 (×2): 500 mg via ORAL
  Filled 2016-09-21 (×2): qty 1

## 2016-09-21 MED ORDER — METOPROLOL SUCCINATE ER 50 MG PO TB24
25.0000 mg | ORAL_TABLET | Freq: Every day | ORAL | Status: DC
  Administered 2016-09-22: 25 mg via ORAL
  Filled 2016-09-21: qty 1

## 2016-09-21 MED ORDER — POLYETHYLENE GLYCOL 3350 17 G PO PACK
17.0000 g | PACK | Freq: Every day | ORAL | Status: DC
  Administered 2016-09-22: 17 g via ORAL
  Filled 2016-09-21: qty 1

## 2016-09-21 MED ORDER — CLOPIDOGREL BISULFATE 75 MG PO TABS
75.0000 mg | ORAL_TABLET | Freq: Every day | ORAL | Status: DC
  Administered 2016-09-22: 75 mg via ORAL
  Filled 2016-09-21: qty 1

## 2016-09-21 MED ORDER — ENOXAPARIN SODIUM 40 MG/0.4ML ~~LOC~~ SOLN
40.0000 mg | SUBCUTANEOUS | Status: DC
  Administered 2016-09-21: 40 mg via SUBCUTANEOUS
  Filled 2016-09-21: qty 0.4

## 2016-09-21 MED ORDER — NITROGLYCERIN 0.4 MG SL SUBL: 0.4000 mg | SUBLINGUAL_TABLET | SUBLINGUAL | Status: DC | PRN

## 2016-09-21 MED ORDER — ONDANSETRON HCL 4 MG PO TABS: 4.0000 mg | ORAL_TABLET | Freq: Four times a day (QID) | ORAL | Status: DC | PRN

## 2016-09-21 MED ORDER — HEPARIN (PORCINE) IN NACL 100-0.45 UNIT/ML-% IJ SOLN
1100.0000 [IU]/h | INTRAMUSCULAR | Status: DC
  Administered 2016-09-21: 1100 [IU]/h via INTRAVENOUS
  Filled 2016-09-21: qty 250

## 2016-09-21 MED ORDER — PANTOPRAZOLE SODIUM 40 MG PO TBEC
40.0000 mg | DELAYED_RELEASE_TABLET | Freq: Every day | ORAL | Status: DC
  Administered 2016-09-22: 40 mg via ORAL
  Filled 2016-09-21: qty 1

## 2016-09-21 MED ORDER — ONDANSETRON HCL 4 MG/2ML IJ SOLN: 4.0000 mg | Freq: Four times a day (QID) | INTRAMUSCULAR | Status: DC | PRN

## 2016-09-21 MED ORDER — INSULIN ASPART 100 UNIT/ML ~~LOC~~ SOLN: 0.0000 [IU] | Freq: Three times a day (TID) | SUBCUTANEOUS | Status: DC

## 2016-09-21 MED ORDER — ASPIRIN 81 MG PO CHEW
CHEWABLE_TABLET | ORAL | Status: AC
  Administered 2016-09-21: 81 mg
  Filled 2016-09-21: qty 1

## 2016-09-21 MED ORDER — GI COCKTAIL ~~LOC~~
ORAL | Status: AC
  Administered 2016-09-21: 13:00:00
  Filled 2016-09-21: qty 30

## 2016-09-21 MED ORDER — ASPIRIN EC 81 MG PO TBEC
81.0000 mg | DELAYED_RELEASE_TABLET | Freq: Every day | ORAL | Status: DC
  Administered 2016-09-22: 81 mg via ORAL
  Filled 2016-09-21: qty 1

## 2016-09-21 MED ORDER — TIOTROPIUM BROMIDE MONOHYDRATE 18 MCG IN CAPS: 1.0000 | ORAL_CAPSULE | RESPIRATORY_TRACT | Status: DC

## 2016-09-21 MED ORDER — PRAVASTATIN SODIUM 20 MG PO TABS
20.0000 mg | ORAL_TABLET | Freq: Every day | ORAL | Status: DC
  Administered 2016-09-21: 20 mg via ORAL
  Filled 2016-09-21: qty 1

## 2016-09-21 MED ORDER — ONDANSETRON HCL 4 MG/2ML IJ SOLN
INTRAMUSCULAR | Status: AC
  Administered 2016-09-21: 4 mg
  Filled 2016-09-21: qty 2

## 2016-09-21 MED ORDER — SODIUM CHLORIDE 0.9% FLUSH
3.0000 mL | Freq: Two times a day (BID) | INTRAVENOUS | Status: DC
Start: 2016-09-21 — End: 2016-09-22
  Administered 2016-09-21: 3 mL via INTRAVENOUS

## 2016-09-21 MED ORDER — OXYCODONE HCL 5 MG PO TABS: 5.0000 mg | ORAL_TABLET | ORAL | Status: DC | PRN

## 2016-09-21 MED ORDER — ACETAMINOPHEN 325 MG PO TABS
650.0000 mg | ORAL_TABLET | Freq: Four times a day (QID) | ORAL | Status: DC | PRN
  Administered 2016-09-21: 650 mg via ORAL
  Filled 2016-09-21: qty 2

## 2016-09-21 MED ORDER — HYDRALAZINE HCL 20 MG/ML IJ SOLN: 10.0000 mg | INTRAMUSCULAR | Status: DC | PRN

## 2016-09-21 MED ORDER — ACETAMINOPHEN 650 MG RE SUPP: 650.0000 mg | Freq: Four times a day (QID) | RECTAL | Status: DC | PRN

## 2016-09-21 NOTE — Progress Notes (Signed)
ANTICOAGULATION CONSULT NOTE - Initial Consult  Pharmacy Consult for heparin Indication: chest pain/ACS  No Known Allergies  Patient Measurements: Height: 6\' 3"  (190.5 cm) Weight: 209 lb (94.8 kg) IBW/kg (Calculated) : 84.5 Heparin Dosing Weight: 94.8 kg  Vital Signs: Temp Source: Oral (10/24 1341) BP: 106/40 (10/24 2007) Pulse Rate: 81 (10/24 2007)  Labs:  Recent Labs  09/21/16 1103 09/21/16 2010  HGB 11.2*  --   HCT 32.0*  --   PLT 189  --   CREATININE 1.30*  --   TROPONINI <0.03 0.10*    Estimated Creatinine Clearance: 47.8 mL/min (by C-G formula based on SCr of 1.3 mg/dL (H)).   Medical History: Past Medical History:  Diagnosis Date  . Back pain   . CHF (congestive heart failure) ([place])   . COPD (chronic obstructive pulmonary disease) ([place])   . Coronary artery disease   . Deafness   . Depression   . Diabetes mellitus without complication ([place])   . Hyperlipidemia   . Hypertension   . Melena   . Obesity   . Peripheral neuropathy ([place])   . Peripheral neuropathy ([name])    bil.hands and feet  . Skin cancer   . Umbilical hernia     Medications:  Scheduled:  . [START ON 09/22/2016] aspirin EC  81 mg Oral Daily  . [START ON 09/22/2016] clopidogrel  75 mg Oral Daily  . [START ON 09/22/2016] FLUoxetine  20 mg Oral Daily  . [START ON 09/22/2016] furosemide  40 mg Oral Daily  . insulin aspart  0-5 Units Subcutaneous QHS  . [START ON 09/22/2016] insulin aspart  0-9 Units Subcutaneous TID WC  . insulin glargine  12 Units Subcutaneous QHS  . [START ON 09/22/2016] metoprolol succinate  25 mg Oral Daily  . [START ON 09/22/2016] pantoprazole  40 mg Oral Daily  . [START ON 09/22/2016] polyethylene glycol  17 g Oral Daily  . pravastatin  20 mg Oral q1800  . ranolazine  500 mg Oral BID  . sodium chloride flush  3 mL Intravenous Q12H  . tamsulosin  0.4 mg Oral QPC supper  . tiotropium  1 capsule Inhalation See admin instructions    Assessment: 89 yom cc CP,  retrosternal, nonradiating 7 of 10, nitro x 3 at home without relief. In ED CP free.  Troponin rose to 0.1 this evening, pharmacy consulted to dose UFH for ACS. Note, patient received LMWH 40 mg subcutaneous at 2155. Will not bolus heparin initially.  Goal of Therapy:  Heparin level 0.3-0.7 units/ml Monitor platelets by anticoagulation protocol: Yes   Plan:  Start heparin infusion at 1100 units/hr Check anti-Xa level in 8 hours and daily while on heparin Continue to monitor H&H and platelets  Laural Benes, Pharm.D., BCPS Clinical Pharmacist 09/21/2016,11:19 PM

## 2016-09-21 NOTE — ED Provider Notes (Signed)
[facility] Emergency Department Provider Note ____________________________________________   I have reviewed the triage vital signs and the triage nursing note.  HISTORY  Chief Complaint Chest Pain   Historian Patient and wife and son  HPI Justin Leonard is a 82 y.o. male with a history of prior MI and cardiac stenting, follows with cardiologist Dr. Humphrey Rolls, presents today with central chest discomfort that started after breakfast when he had 3 eggs and cereal. Symptoms are persistent, although somewhat improved. He had some burping and belching. No shortness of breath. No fever or coughing. Some nausea. He is having ongoing nausea. Symptoms are moderate. He states this is similar to when he was found have a blockage in the past.    Past Medical History:  Diagnosis Date  . Back pain   . CHF (congestive heart failure) ([place])   . COPD (chronic obstructive pulmonary disease) ([place])   . Coronary artery disease   . Deafness   . Depression   . Diabetes mellitus without complication ([place])   . Hyperlipidemia   . Hypertension   . Melena   . Obesity   . Peripheral neuropathy ([place])   . Peripheral neuropathy ([name])    bil.hands and feet  . Skin cancer   . Umbilical hernia     Patient Active Problem List   Diagnosis Date Noted  . Calculus of gallbladder with acute cholecystitis without obstruction   . NSTEMI (non-ST elevated myocardial infarction) ([place]) 05/09/2016  . Abdominal pain, bilateral upper quadrant 05/09/2016  . S/P CABG (coronary artery bypass graft) 05/09/2016  . Bleeding gastrointestinal   . Diabetic hypoglycemia ([place]) 10/13/2015  . Bradycardia 07/30/2015  . Elevated troponin 07/15/2015  . Chronic diastolic heart failure ([place]) 04/17/2015  . HTN (hypertension) 04/17/2015  . Diabetes ([place]) 04/17/2015  . COPD (chronic obstructive pulmonary disease) ([place]) 04/17/2015  . Clinical depression 08/23/2012  . Melanoma in situ ([place]) 08/23/2012  .  Exomphalos 08/23/2012  . Major depressive disorder with single episode 08/23/2012  . Back pain 08/11/2012  . Difficulty hearing 01/18/2012  . HLD (hyperlipidemia) 01/18/2012  . Peripheral nerve disease ([place]) 01/18/2012  . Adiposity 01/18/2012    Past Surgical History:  Procedure Laterality Date  . CARDIAC CATHETERIZATION Left 07/17/2015   Procedure: Right/Left Heart Cath and Coronary Angiography;  Surgeon: [name], MD;  Location: [facility] CV LAB;  Service: Cardiovascular;  Laterality: Left;  . CARDIAC CATHETERIZATION N/A 07/17/2015   Procedure: Coronary Stent Intervention;  Surgeon: [name], MD;  Location: [facility] CV LAB;  Service: Cardiovascular;  Laterality: N/A;  . CARDIAC CATHETERIZATION Right 05/10/2016   Procedure: Left Heart Cath and Coronary Angiography;  Surgeon: [name], MD;  Location: [facility] CV LAB;  Service: Cardiovascular;  Laterality: Right;  . COLONOSCOPY WITH PROPOFOL N/A 12/12/2015   Procedure: COLONOSCOPY WITH PROPOFOL;  Surgeon: [name], MD;  Location: [facility] ENDOSCOPY;  Service: Gastroenterology;  Laterality: N/A;  . CORONARY ANGIOPLASTY    . CORONARY ARTERY BYPASS GRAFT    . ESOPHAGOGASTRODUODENOSCOPY N/A 11/10/2015   Procedure: ESOPHAGOGASTRODUODENOSCOPY (EGD);  Surgeon: [name], MD;  Location: [facility] ENDOSCOPY;  Service: Endoscopy;  Laterality: N/A;  . gall bladder drain      Prior to Admission medications   Medication Sig Start Date End Date Taking? Authorizing Provider  acetaminophen (TYLENOL) 650 MG CR tablet Take 650 mg by mouth every 8 (eight) hours as needed for pain.    Historical Provider, MD  clopidogrel (PLAVIX) 75 MG  tablet Take 1 tablet (75 mg total) by mouth daily. 05/12/16   Vaughan Basta, MD  FLUoxetine (PROZAC) 20 MG capsule Take 20 mg by mouth daily.    Historical Provider, MD  furosemide (LASIX) 40 MG tablet Take 1 tablet (40 mg total) by mouth daily. Patient taking differently: Take 20 mg by mouth  daily.  11/27/15   Carrie Mew, MD  insulin glargine (LANTUS) 100 UNIT/ML injection Inject 12 Units into the skin at bedtime.     Historical Provider, MD  isosorbide mononitrate (IMDUR) 30 MG 24 hr tablet Take 1 tablet (30 mg total) by mouth daily. 05/27/16 05/27/17  Earleen Newport, MD  lovastatin (MEVACOR) 20 MG tablet Take 20 mg by mouth at bedtime.    Historical Provider, MD  metFORMIN (GLUCOPHAGE) 500 MG tablet Take 500 mg by mouth 3 (three) times daily.    Historical Provider, MD  metoprolol succinate (TOPROL-XL) 25 MG 24 hr tablet Take 25 mg by mouth daily.    Historical Provider, MD  nitroGLYCERIN (NITROSTAT) 0.4 MG SL tablet Place 1 tablet under the tongue every 5 (five) minutes as needed. Reported on 12/16/2015 07/15/15 07/14/16  Historical Provider, MD  pantoprazole (PROTONIX) 40 MG tablet Take 40 mg by mouth daily.  08/26/15   Historical Provider, MD  Polyethylene Glycol 3350 (MIRALAX PO) Take 17 g by mouth daily.    Historical Provider, MD  ranolazine (RANEXA) 500 MG 12 hr tablet Take by mouth.    Historical Provider, MD  tamsulosin (FLOMAX) 0.4 MG CAPS capsule Take 1 capsule (0.4 mg total) by mouth daily after supper. 12/16/15   Roda Shutters, FNP  Tiotropium Bromide Monohydrate (SPIRIVA RESPIMAT) 2.5 MCG/ACT AERS Inhale 2 puffs into the lungs See admin instructions. Inhale 2 puffs 1 to 2 times a day.    Historical Provider, MD  VENTOLIN HFA 108 (90 BASE) MCG/ACT inhaler Inhale 1-2 puffs into the lungs as needed. Reported on 12/16/2015 06/24/15   Historical Provider, MD    No Known Allergies  Family History  Problem Relation Age of Onset  . Hypertension Father   . CVA Father   . Anemia Neg Hx   . Arrhythmia Neg Hx   . Asthma Neg Hx   . Clotting disorder Neg Hx   . Fainting Neg Hx   . Heart attack Neg Hx   . Heart disease Neg Hx   . Heart failure Neg Hx   . Hyperlipidemia Neg Hx     Social History Social History  Substance Use Topics  . Smoking status: Former  Smoker    Packs/day: 3.00    Years: 30.00    Types: Cigarettes    Quit date: 04/16/1978  . Smokeless tobacco: Never Used     Comment: Quit smoking 1979  . Alcohol use No    Review of Systems  Constitutional: Negative for fever. Eyes: Negative for visual changes. ENT: Negative for sore throat. Cardiovascular: Negative for Palpitations. Respiratory: Negative for shortness of breath. Gastrointestinal: Negative for abdominal pain, vomiting and diarrhea. Genitourinary: Negative for dysuria. Musculoskeletal: Negative for back pain. Skin: Negative for rash. Neurological: Negative for headache. 10 point Review of Systems otherwise negative ____________________________________________   PHYSICAL EXAM:  VITAL SIGNS: ED Triage Vitals [09/21/16 1100]  Enc Vitals Group     BP (!) 158/52     Pulse Rate 61     Resp 18     Temp 97.1 F (36.2 C)     Temp Source Oral  SpO2 96 %     Weight 209 lb (94.8 kg)     Height 6\' 3"  (1.905 m)     Head Circumference      Peak Flow      Pain Score 4     Pain Loc      Pain Edu?      Excl. in [place]?      Constitutional: Alert and oriented. Well appearing and in no distress. HEENT   Head: Normocephalic and atraumatic.      Eyes: Conjunctivae are normal. PERRL. Normal extraocular movements.      Ears:         Nose: No congestion/rhinnorhea.   Mouth/Throat: Mucous membranes are moist.   Neck: No stridor. Cardiovascular/Chest: Normal rate, regular rhythm.  No murmurs, rubs, or gallops. Respiratory: Normal respiratory effort without tachypnea nor retractions. Breath sounds are clear and equal bilaterally. No wheezes/rales/rhonchi. Gastrointestinal: Soft. No distention, no guarding, no rebound. Nontender.    Genitourinary/rectal:Deferred Musculoskeletal: Nontender with normal range of motion in all extremities. No joint effusions.  No lower extremity tenderness.  No edema. Neurologic:  Normal speech and language. No gross or focal  neurologic deficits are appreciated. Skin:  Skin is warm, dry and intact. No rash noted. Psychiatric: Mood and affect are normal. Speech and behavior are normal. Patient exhibits appropriate insight and judgment.   ____________________________________________  LABS (pertinent positives/negatives)  Labs Reviewed  BASIC METABOLIC PANEL - Abnormal; Notable for the following:       Result Value   Glucose, Bld 104 (*)    BUN 24 (*)    Creatinine, Ser 1.30 (*)    GFR calc non Af Amer 48 (*)    GFR calc Af Amer 55 (*)    All other components within normal limits  CBC - Abnormal; Notable for the following:    RBC 3.49 (*)    Hemoglobin 11.2 (*)    HCT 32.0 (*)    All other components within normal limits  TROPONIN I    ____________________________________________    EKG I, Lisa Roca, MD, the attending physician have personally viewed and interpreted all ECGs.  67 beats per minute. normal sinus rhythm. Right bundle branch block. Nonspecific ST and T-wave ____________________________________________  RADIOLOGY All Xrays were viewed by me. Imaging interpreted by Radiologist.  Chest two-view: No active cardiopulmonary disease. Aortic atherosclerosis. __________________________________________  PROCEDURES  Procedure(s) performed: None  Critical Care performed: None  ____________________________________________   ED COURSE / ASSESSMENT AND PLAN  Pertinent labs & imaging results that were available during my care of the patient were reviewed by me and considered in my medical decision making (see chart for details).    Mr. Cari Caraway had some chest discomfort with some nausea earlier and does have a history significant for coronary artery disease requiring stenting. He states that this is similar to when he needed stenting prior. His EKG is reassuring. His initial troponin, less than 4 hours from onset is reassuring.  I discussed potentially keeping the patient in the  ER for repeat troponin, versus observation admission. I think given his age and his significant history and his ongoing nausea, I think it's reasonable to have him admitted to the hospital. Patient and family are happy with this plan. He took 3 aspirins at home, took 4 baby aspirin here. He's had nausea medication.    CONSULTATIONS:  Hospitalist for admission.   Patient / Family / Caregiver informed of clinical course, medical decision-making process, and agree with plan.  ___________________________________________   FINAL CLINICAL IMPRESSION(S) / ED DIAGNOSES   Final diagnoses:  Chest pain, unspecified type              Note: This dictation was prepared with Dragon dictation. Any transcriptional errors that result from this process are unintentional    Lisa Roca, MD 09/21/16 1528

## 2016-09-21 NOTE — ED Triage Notes (Signed)
Pt presents with chest pain that started around 10am this am while sitting in chair. Pt states he took three nitro tabs and three baby aspirin with some relief.

## 2016-09-21 NOTE — ED Notes (Signed)
Pt placed on 2L [place]

## 2016-09-21 NOTE — ED Notes (Signed)
Patient vomited right after he took the gi cocktail.

## 2016-09-21 NOTE — ED Notes (Signed)
Patient vomited x 1 after standing to use urinal.

## 2016-09-21 NOTE — Progress Notes (Signed)
[place] responded to an OR for room 237. Nurse offered that when asked if the Pt would like to see a [place] the Pt responded that he "wouldn't mind seeing one." Pt was alert and in good spirits. Wife and son were bedside. Pt indicated that he was feeling better than he did this morning. (Chest pains now resolved) We had a good conversation around fishing. Pt requested prayer which was provided. CH is available for follow up as needed.    09/21/16 2000  Clinical Encounter Type  Visited With Patient;Patient and family together;Health care provider  Visit Type Initial;Spiritual support  Referral From Nurse  Spiritual Encounters  Spiritual Needs Prayer

## 2016-09-21 NOTE — H&P (Signed)
[facility] [place] NAME: Justin Leonard    MR#:  [id number]  DATE OF BIRTH:  1927-08-21   DATE OF ADMISSION:  09/21/2016  PRIMARY CARE PHYSICIAN: [name], MD   REQUESTING/REFERRING PHYSICIAN: Reita Cliche  CHIEF COMPLAINT:   Chief Complaint  Patient presents with  . Chest Pain    HISTORY OF PRESENT ILLNESS:  Justin Leonard  is a 82 y.o. male with a known history of Coronary artery disease, type 2 diabetes insulin requiring who is presenting with chest pain. Describes episode of chest pain which occurred at rest retrosternal in location pressure in quality and nonradiating intensity 7/10 took nitroglycerin at home 3 without relief thus prompted to present to Hospital further workup and evaluation. He is chest pain-free  PAST MEDICAL HISTORY:   Past Medical History:  Diagnosis Date  . Back pain   . CHF (congestive heart failure) ([place])   . COPD (chronic obstructive pulmonary disease) ([place])   . Coronary artery disease   . Deafness   . Depression   . Diabetes mellitus without complication ([place])   . Hyperlipidemia   . Hypertension   . Melena   . Obesity   . Peripheral neuropathy ([place])   . Peripheral neuropathy ([name])    bil.hands and feet  . Skin cancer   . Umbilical hernia     PAST SURGICAL HISTORY:   Past Surgical History:  Procedure Laterality Date  . CARDIAC CATHETERIZATION Left 07/17/2015   Procedure: Right/Left Heart Cath and Coronary Angiography;  Surgeon: Dionisio Donavan Kerlin, MD;  Location: [facility] LAB;  Service: Cardiovascular;  Laterality: Left;  . CARDIAC CATHETERIZATION N/A 07/17/2015   Procedure: Coronary Stent Intervention;  Surgeon: [name], MD;  Location: [facility] LAB;  Service: Cardiovascular;  Laterality: N/A;  . CARDIAC CATHETERIZATION Right 05/10/2016   Procedure: Left Heart Cath and Coronary Angiography;  Surgeon: Dionisio Laurin Paulo, MD;  Location: [facility] LAB;  Service:  Cardiovascular;  Laterality: Right;  . COLONOSCOPY WITH PROPOFOL N/A 12/12/2015   Procedure: COLONOSCOPY WITH PROPOFOL;  Surgeon: [name], MD;  [facility] ENDOSCOPY;  Service: Gastroenterology;  Laterality: N/A;  . CORONARY ANGIOPLASTY    . CORONARY ARTERY BYPASS GRAFT    . ESOPHAGOGASTRODUODENOSCOPY N/A 11/10/2015   Procedure: ESOPHAGOGASTRODUODENOSCOPY (EGD);  Surgeon: [name], MD;  Location: [facility] ENDOSCOPY;  Service: Endoscopy;  Laterality: N/A;  . gall bladder drain      SOCIAL HISTORY:   Social History  Substance Use Topics  . Smoking status: Former Smoker    Packs/day: 3.00    Years: 30.00    Types: Cigarettes    Quit date: 04/16/1978  . Smokeless tobacco: Never Used     Comment: Quit smoking 1979  . Alcohol use No    FAMILY HISTORY:   Family History  Problem Relation Age of Onset  . Hypertension Father   . CVA Father   . Anemia Neg Hx   . Arrhythmia Neg Hx   . Asthma Neg Hx   . Clotting disorder Neg Hx   . Fainting Neg Hx   . Heart attack Neg Hx   . Heart disease Neg Hx   . Heart failure Neg Hx   . Hyperlipidemia Neg Hx     DRUG ALLERGIES:  No Known Allergies  REVIEW OF SYSTEMS:  REVIEW OF SYSTEMS:  CONSTITUTIONAL: Denies fevers, Positive chills, denies fatigue, weakness.  EYES: Denies blurred vision, double vision, or  eye pain.  EARS, NOSE, THROAT: Denies tinnitus, ear pain, hearing loss.  RESPIRATORY: denies cough, shortness of breath, wheezing  CARDIOVASCULAR: Positive chest pain, denies palpitations, edema.  GASTROINTESTINAL: Denies nausea, vomiting, diarrhea, abdominal pain.  GENITOURINARY: Denies dysuria, hematuria.  ENDOCRINE: Denies nocturia or thyroid problems. HEMATOLOGIC AND LYMPHATIC: Denies easy bruising or bleeding.  SKIN: Denies rash or lesions.  MUSCULOSKELETAL: Denies pain in neck, back, shoulder, knees, hips, or further arthritic symptoms.  NEUROLOGIC: Denies paralysis, paresthesias.  PSYCHIATRIC: Denies anxiety or depressive  symptoms. Otherwise full review of systems performed by me is negative.   MEDICATIONS AT HOME:   Prior to Admission medications   Medication Sig Start Date End Date Taking? Authorizing Provider  acetaminophen (TYLENOL) 650 MG CR tablet Take 650 mg by mouth 2 (two) times daily.    Yes Historical Provider, MD  aspirin EC 81 MG tablet Take 81 mg by mouth daily.   Yes Historical Provider, MD  clopidogrel (PLAVIX) 75 MG tablet Take 1 tablet (75 mg total) by mouth daily. 05/12/16  Yes Vaughan Basta, MD  FLUoxetine (PROZAC) 20 MG capsule Take 20 mg by mouth daily.   Yes Historical Provider, MD  furosemide (LASIX) 40 MG tablet Take 1 tablet (40 mg total) by mouth daily. 11/27/15  Yes Carrie Mew, MD  insulin glargine (LANTUS) 100 UNIT/ML injection Inject 12 Units into the skin at bedtime.    Yes Historical Provider, MD  lovastatin (MEVACOR) 20 MG tablet Take 20 mg by mouth at bedtime.   Yes Historical Provider, MD  metFORMIN (GLUCOPHAGE) 500 MG tablet Take 500 mg by mouth 2 (two) times daily.    Yes Historical Provider, MD  metoprolol succinate (TOPROL-XL) 25 MG 24 hr tablet Take 25 mg by mouth daily.   Yes Historical Provider, MD  pantoprazole (PROTONIX) 40 MG tablet Take 40 mg by mouth daily.  08/26/15  Yes Historical Provider, MD  Polyethylene Glycol 3350 (MIRALAX PO) Take 17 g by mouth daily.   Yes Historical Provider, MD  ranolazine (RANEXA) 500 MG 12 hr tablet Take 500 mg by mouth 2 (two) times daily.    Yes Historical Provider, MD  tamsulosin (FLOMAX) 0.4 MG CAPS capsule Take 1 capsule (0.4 mg total) by mouth daily after supper. 12/16/15  Yes Roda Shutters, FNP  Tiotropium Bromide Monohydrate (SPIRIVA RESPIMAT) 2.5 MCG/ACT AERS Inhale 2 puffs into the lungs See admin instructions. Inhale 2 puffs 1 to 2 times a day.   Yes Historical Provider, MD  VENTOLIN HFA 108 (90 BASE) MCG/ACT inhaler Inhale 1-2 puffs into the lungs as needed. Reported on 12/16/2015 06/24/15  Yes Historical  Provider, MD  nitroGLYCERIN (NITROSTAT) 0.4 MG SL tablet Place 1 tablet under the tongue every 5 (five) minutes as needed. Reported on 12/16/2015 07/15/15 07/14/16  Historical Provider, MD      VITAL SIGNS:  Blood pressure (!) 195/85, pulse 87, temperature 97.1 F (36.2 C), temperature source Oral, resp. rate 16, height 6\' 3"  (1.905 m), weight 94.8 kg (209 lb), SpO2 93 %.  PHYSICAL EXAMINATION:  VITAL SIGNS: Vitals:   09/21/16 1322 09/21/16 1341  BP: (!) 179/71 (!) 195/85  Pulse: 78 87  Resp: 18 16  Temp:     GENERAL:82 y.o.male currently in no acute distress.  HEAD: Normocephalic, atraumatic.  EYES: Pupils equal, round, reactive to light. Extraocular muscles intact. No scleral icterus.  MOUTH: Moist mucosal membrane. Dentition intact. No abscess noted.  EAR, NOSE, THROAT: Clear without exudates. No external lesions.  NECK: Supple. No thyromegaly.  No nodules. No JVD.  PULMONARY: Clear to ascultation, without wheeze rails or rhonci. No use of accessory muscles, Good respiratory effort. good air entry bilaterally CHEST: Nontender to palpation.  CARDIOVASCULAR: S1 and S2. Regular rate and rhythm. No murmurs, rubs, or gallops. No edema. Pedal pulses 2+ bilaterally.  GASTROINTESTINAL: Soft, nontender, nondistended. No masses. Positive bowel sounds. No hepatosplenomegaly.  MUSCULOSKELETAL: No swelling, clubbing, or edema. Range of motion full in all extremities.  NEUROLOGIC: Cranial nerves II through XII are intact. No gross focal neurological deficits. Sensation intact. Reflexes intact.  SKIN: No ulceration, lesions, rashes, or cyanosis. Skin warm and dry. Turgor intact.  PSYCHIATRIC: Mood, affect within normal limits. The patient is awake, alert and oriented x 3. Insight, judgment intact.    LABORATORY PANEL:   CBC  Recent Labs Lab 09/21/16 1103  WBC 6.6  HGB 11.2*  HCT 32.0*  PLT 189    ------------------------------------------------------------------------------------------------------------------  Chemistries   Recent Labs Lab 09/21/16 1103  NA 138  K 4.3  CL 105  CO2 27  GLUCOSE 104*  BUN 24*  CREATININE 1.30*  CALCIUM 9.9   ------------------------------------------------------------------------------------------------------------------  Cardiac Enzymes  Recent Labs Lab 09/21/16 1103  TROPONINI <0.03   ------------------------------------------------------------------------------------------------------------------  RADIOLOGY:  Dg Chest 2 View  Result Date: 09/21/2016 CLINICAL DATA:  Chest pain. EXAM: CHEST  2 VIEW COMPARISON:  Radiographs of May 27, 2016. FINDINGS: The heart size and mediastinal contours are within normal limits. Both lungs are clear. No pneumothorax or pleural effusion is noted. Status post coronary artery bypass graft. Atherosclerosis of thoracic aorta is noted. The visualized skeletal structures are unremarkable. IMPRESSION: No active cardiopulmonary disease.  Aortic atherosclerosis. Electronically Signed   By: Marijo Conception, M.D.   On: 09/21/2016 11:45    EKG:   Orders placed or performed during the hospital encounter of 09/21/16  . ED EKG within 10 minutes  . ED EKG within 10 minutes    IMPRESSION AND PLAN:   82 year old Caucasian gentleman history of type 2 diabetes insulin requiring coronary disease presenting with chest pain  1.Chest pain, central: Initiate aspirin and statin therapy, admitted to telemetry, trend cardiac enzymes 3,  if continued elevation will initiate heparin drip ,nitroglycerin when necessary, morphine when necessary, consult cardiology follows with dr Humphrey Rolls  2. Type 2 diabetes insulin requiring continue basal insulin and sliding scale coverage hold oral agents 3. Essential hypertension: Lopressor 4. COPD, chronic not in acute exacerbation continue home medications 5. History of gallbladder  illness-family states he previously had issues with gallbladder requiring multiple day hospitalization, per history patient has not been complaining of any issues related to biliary colic are otherwise we will check liver function tests      All the records are reviewed and case discussed with ED provider. Management plans discussed with the patient, family and they are in agreement.  CODE STATUS: Full  TOTAL TIME TAKING CARE OF THIS PATIENT: 33 minutes.    Helana Macbride,  Karenann Cai.D on 09/21/2016 at 4:21 PM  Between 7am to 6pm - Pager - [phone]  After 6pm: House Pager: - [phone]  [facility]  Office  [phone]  CC: Primary care physician; [name], MD

## 2016-09-22 LAB — CBC
HEMATOCRIT: 29.5 % — AB (ref 40.0–52.0)
Hemoglobin: 10.2 g/dL — ABNORMAL LOW (ref 13.0–18.0)
MCH: 31.6 pg (ref 26.0–34.0)
MCHC: 34.5 g/dL (ref 32.0–36.0)
MCV: 91.5 fL (ref 80.0–100.0)
Platelets: 155 10*3/uL (ref 150–440)
RBC: 3.22 MIL/uL — AB (ref 4.40–5.90)
RDW: 13.3 % (ref 11.5–14.5)
WBC: 13.9 10*3/uL — AB (ref 3.8–10.6)

## 2016-09-22 LAB — APTT: APTT: 35 s (ref 24–36)

## 2016-09-22 LAB — PROTIME-INR
INR: 1.16
PROTHROMBIN TIME: 14.9 s (ref 11.4–15.2)

## 2016-09-22 LAB — TROPONIN I
TROPONIN I: 0.13 ng/mL — AB (ref <0.03)
Troponin I: 0.1 ng/mL (ref <0.03)
Troponin I: 0.11 ng/mL (ref <0.03)

## 2016-09-22 LAB — HEPARIN LEVEL (UNFRACTIONATED): Heparin Unfractionated: 0.57 IU/mL (ref 0.30–0.70)

## 2016-09-22 LAB — GLUCOSE, CAPILLARY
GLUCOSE-CAPILLARY: 116 mg/dL — AB (ref 65–99)
Glucose-Capillary: 107 mg/dL — ABNORMAL HIGH (ref 65–99)

## 2016-09-22 MED ORDER — ISOSORBIDE MONONITRATE ER 30 MG PO TB24: 30.0000 mg | ORAL_TABLET | Freq: Every day | ORAL | 0 refills | Status: DC

## 2016-09-22 MED ORDER — ISOSORBIDE MONONITRATE ER 30 MG PO TB24: 30.0000 mg | ORAL_TABLET | Freq: Every day | ORAL | Status: DC

## 2016-09-22 NOTE — Consult Note (Signed)
Justin Leonard is a 82 y.o. male  [id number]  Primary Cardiologist: [name] Reason for Consultation: Chest pain  HPI: Justin Leonard is well known to [place]. He has known coronary artery disease with a history of bypass surgery, CHF, hyperlipidemia, hypertension, diabetes. He developed substernal chest pain that was nonradiating and unresponsive to sublingual nitroglycerin. He also had associated nausea and vomiting while in the emergency room. This was a single episode and he has been chest pain-free since being in the emergency room.  Cardiac Cath 05/10/16: Patient has a LIMA to the LAD which is patent. SVG to PDA is patent. SVG to OM is patent but distally in the OM there is moderate to severe disease which may be cause of elevated troponin. LV gram was deferred due to renal insufficiency. Advise aggressive medical therapy.   Review of Systems: Negative for current chest pain, dyspnea, swelling, orthopnea, lightheadedness, nausea, or dizziness   Past Medical History:  Diagnosis Date  . Back pain   . CHF (congestive heart failure) ([place])   . COPD (chronic obstructive pulmonary disease) ([place])   . Coronary artery disease   . Deafness   . Depression   . Diabetes mellitus without complication ([place])   . Hyperlipidemia   . Hypertension   . Melena   . Obesity   . Peripheral neuropathy ([place])   . Peripheral neuropathy ([name])    bil.hands and feet  . Skin cancer   . Umbilical hernia     Medications Prior to Admission  Medication Sig Dispense Refill  . acetaminophen (TYLENOL) 650 MG CR tablet Take 650 mg by mouth 2 (two) times daily.     Marland Kitchen aspirin EC 81 MG tablet Take 81 mg by mouth daily.    . clopidogrel (PLAVIX) 75 MG tablet Take 1 tablet (75 mg total) by mouth daily. 30 tablet 0  . FLUoxetine (PROZAC) 20 MG capsule Take 20 mg by mouth daily.    . furosemide (LASIX) 40 MG tablet Take 1 tablet (40 mg total) by mouth daily. 10 tablet 0  . insulin glargine (LANTUS) 100 UNIT/ML injection  Inject 12 Units into the skin at bedtime.     . lovastatin (MEVACOR) 20 MG tablet Take 20 mg by mouth at bedtime.    . metFORMIN (GLUCOPHAGE) 500 MG tablet Take 500 mg by mouth 2 (two) times daily.     . metoprolol succinate (TOPROL-XL) 25 MG 24 hr tablet Take 25 mg by mouth daily.    . pantoprazole (PROTONIX) 40 MG tablet Take 40 mg by mouth daily.     . Polyethylene Glycol 3350 (MIRALAX PO) Take 17 g by mouth daily.    . ranolazine (RANEXA) 500 MG 12 hr tablet Take 500 mg by mouth 2 (two) times daily.     . tamsulosin (FLOMAX) 0.4 MG CAPS capsule Take 1 capsule (0.4 mg total) by mouth daily after supper. 30 capsule 12  . Tiotropium Bromide Monohydrate (SPIRIVA RESPIMAT) 2.5 MCG/ACT AERS Inhale 2 puffs into the lungs See admin instructions. Inhale 2 puffs 1 to 2 times a day.    . VENTOLIN HFA 108 (90 BASE) MCG/ACT inhaler Inhale 1-2 puffs into the lungs as needed. Reported on 12/16/2015    . nitroGLYCERIN (NITROSTAT) 0.4 MG SL tablet Place 1 tablet under the tongue every 5 (five) minutes as needed. Reported on 12/16/2015       . aspirin EC  81 mg Oral Daily  . clopidogrel  75 mg Oral Daily  . FLUoxetine  20 mg Oral Daily  . furosemide  40 mg Oral Daily  . insulin aspart  0-5 Units Subcutaneous QHS  . insulin aspart  0-9 Units Subcutaneous TID WC  . insulin glargine  12 Units Subcutaneous QHS  . metoprolol succinate  25 mg Oral Daily  . pantoprazole  40 mg Oral Daily  . polyethylene glycol  17 g Oral Daily  . pravastatin  20 mg Oral q1800  . ranolazine  500 mg Oral BID  . sodium chloride flush  3 mL Intravenous Q12H  . tamsulosin  0.4 mg Oral QPC supper  . tiotropium  1 capsule Inhalation See admin instructions    Infusions: . heparin 1,100 Units/hr (09/21/16 2349)    No Known Allergies  Social History   Social History  . Marital status: Married    Spouse name: N/A  . Number of children: N/A  . Years of education: N/A   Occupational History  . Not on file.   Social  History Main Topics  . Smoking status: Former Smoker    Packs/day: 3.00    Years: 30.00    Types: Cigarettes    Quit date: 04/16/1978  . Smokeless tobacco: Never Used     Comment: Quit smoking 1979  . Alcohol use No  . Drug use: No  . Sexual activity: Not Currently   Other Topics Concern  . Not on file   Social History Narrative  . No narrative on file    Family History  Problem Relation Age of Onset  . Hypertension Father   . CVA Father   . Anemia Neg Hx   . Arrhythmia Neg Hx   . Asthma Neg Hx   . Clotting disorder Neg Hx   . Fainting Neg Hx   . Heart attack Neg Hx   . Heart disease Neg Hx   . Heart failure Neg Hx   . Hyperlipidemia Neg Hx     PHYSICAL EXAM: Vitals:   09/21/16 2007 09/22/16 0349  BP: (!) 106/40 (!) 134/53  Pulse: 81 64  Resp: 14 18  Temp:  98.2 F (36.8 C)     Intake/Output Summary (Last 24 hours) at 09/22/16 0948 Last data filed at 09/22/16 0700  Gross per 24 hour  Intake                0 ml  Output             1175 ml  Net            -1175 ml    General:  Well appearing. No respiratory difficulty HEENT: normal Neck: supple. no JVD. Carotids 2+ bilat; no bruits. No lymphadenopathy or thryomegaly appreciated. Cor: PMI nondisplaced. Regular rate & rhythm. No rubs, gallops or murmurs. Lungs: clear Abdomen: soft, nontender, nondistended. No hepatosplenomegaly. No bruits or masses. Good bowel sounds. Extremities: no cyanosis, clubbing, rash, edema Neuro: alert & oriented x 3, cranial nerves grossly intact. moves all 4 extremities w/o difficulty. Affect pleasant.  ECG: Sinus rhythm with right bundle branch block and old anteroseptal MI with Q waves that were also previously present in June, no acute ischemic changes  Results for orders placed or performed during the hospital encounter of 09/21/16 (from the past 24 hour(s))  Basic metabolic panel     Status: Abnormal   Collection Time: 09/21/16 11:03 AM  Result Value Ref Range   Sodium  138 135 - 145 mmol/L   Potassium 4.3 3.5 - 5.1 mmol/L   Chloride  105 101 - 111 mmol/L   CO2 27 22 - 32 mmol/L   Glucose, Bld 104 (H) 65 - 99 mg/dL   BUN 24 (H) 6 - 20 mg/dL   Creatinine, Ser 1.30 (H) 0.61 - 1.24 mg/dL   Calcium 9.9 8.9 - 10.3 mg/dL   GFR calc non Af Amer 48 (L) >60 mL/min   GFR calc Af Amer 55 (L) >60 mL/min   Anion gap 6 5 - 15  CBC     Status: Abnormal   Collection Time: 09/21/16 11:03 AM  Result Value Ref Range   WBC 6.6 3.8 - 10.6 K/uL   RBC 3.49 (L) 4.40 - 5.90 MIL/uL   Hemoglobin 11.2 (L) 13.0 - 18.0 g/dL   HCT 32.0 (L) 40.0 - 52.0 %   MCV 91.7 80.0 - 100.0 fL   MCH 32.1 26.0 - 34.0 pg   MCHC 35.0 32.0 - 36.0 g/dL   RDW 13.2 11.5 - 14.5 %   Platelets 189 150 - 440 K/uL  Troponin I     Status: None   Collection Time: 09/21/16 11:03 AM  Result Value Ref Range   Troponin I <0.03 <0.03 ng/mL  Hepatic function panel     Status: Abnormal   Collection Time: 09/21/16 11:03 AM  Result Value Ref Range   Total Protein 6.6 6.5 - 8.1 g/dL   Albumin 3.8 3.5 - 5.0 g/dL   AST 44 (H) 15 - 41 U/L   ALT 22 17 - 63 U/L   Alkaline Phosphatase 85 38 - 126 U/L   Total Bilirubin 1.1 0.3 - 1.2 mg/dL   Bilirubin, Direct 0.3 0.1 - 0.5 mg/dL   Indirect Bilirubin 0.8 0.3 - 0.9 mg/dL  Troponin I (q 6hr x 3)     Status: Abnormal   Collection Time: 09/21/16  8:10 PM  Result Value Ref Range   Troponin I 0.10 (HH) <0.03 ng/mL  Glucose, capillary     Status: Abnormal   Collection Time: 09/21/16  9:31 PM  Result Value Ref Range   Glucose-Capillary 164 (H) 65 - 99 mg/dL  APTT     Status: None   Collection Time: 09/21/16 11:29 PM  Result Value Ref Range   aPTT 35 24 - 36 seconds  Protime-INR     Status: None   Collection Time: 09/21/16 11:29 PM  Result Value Ref Range   Prothrombin Time 14.9 11.4 - 15.2 seconds   INR 1.16   Troponin I (q 6hr x 3)     Status: Abnormal   Collection Time: 09/22/16  1:10 AM  Result Value Ref Range   Troponin I 0.13 (HH) <0.03 ng/mL  Troponin I  (q 6hr x 3)     Status: Abnormal   Collection Time: 09/22/16  7:15 AM  Result Value Ref Range   Troponin I 0.11 (HH) <0.03 ng/mL  Heparin level (unfractionated)     Status: None   Collection Time: 09/22/16  7:15 AM  Result Value Ref Range   Heparin Unfractionated 0.57 0.30 - 0.70 IU/mL  CBC     Status: Abnormal   Collection Time: 09/22/16  7:15 AM  Result Value Ref Range   WBC 13.9 (H) 3.8 - 10.6 K/uL   RBC 3.22 (L) 4.40 - 5.90 MIL/uL   Hemoglobin 10.2 (L) 13.0 - 18.0 g/dL   HCT 29.5 (L) 40.0 - 52.0 %   MCV 91.5 80.0 - 100.0 fL   MCH 31.6 26.0 - 34.0 pg   MCHC 34.5 32.0 -  36.0 g/dL   RDW 13.3 11.5 - 14.5 %   Platelets 155 150 - 440 K/uL  Glucose, capillary     Status: Abnormal   Collection Time: 09/22/16  7:34 AM  Result Value Ref Range   Glucose-Capillary 107 (H) 65 - 99 mg/dL   Dg Chest 2 View  Result Date: 09/21/2016 CLINICAL DATA:  Chest pain. EXAM: CHEST  2 VIEW COMPARISON:  Radiographs of May 27, 2016. FINDINGS: The heart size and mediastinal contours are within normal limits. Both lungs are clear. No pneumothorax or pleural effusion is noted. Status post coronary artery bypass graft. Atherosclerosis of thoracic aorta is noted. The visualized skeletal structures are unremarkable. IMPRESSION: No active cardiopulmonary disease.  Aortic atherosclerosis. Electronically Signed   By: Marijo Conception, M.D.   On: 09/21/2016 11:45     ASSESSMENT AND PLAN: Angina type chest pain in patient with known coronary artery disease and history of CABG and recent cardiac catheterization on 05/10/2016 revealing moderate to severe disease in the distal OM and otherwise patent grafts. He is being treated medically. He has mildly elevated troponins which have peaked at 0.13 and last one 0.11. Currently he has no chest pain or associated symptoms and feels comfortable being discharged today with follow-up in the office tomorrow and will discuss stress test vs CCTA. He is already on Ranexa 500 mg twice  a day and will add isosorbide 30 mg daily. The patient is stable for discharge.  Daune Perch, NP 09/22/2016 9:48 AM

## 2016-09-22 NOTE — Discharge Summary (Signed)
[facility] [place] NAME: Justin Leonard    MR#:  [id number]  DATE OF BIRTH:  [birth year]  DATE OF ADMISSION:  09/21/2016 ADMITTING PHYSICIAN: Lytle Butte, MD  DATE OF DISCHARGE: 09/22/16 PRIMARY CARE PHYSICIAN: [name], MD    ADMISSION DIAGNOSIS:  Chest pain, unspecified type [R07.9]  DISCHARGE DIAGNOSIS:  Active Problems:   Chest pain   SECONDARY DIAGNOSIS:   Past Medical History:  Diagnosis Date  . Back pain   . CHF (congestive heart failure) ([place])   . COPD (chronic obstructive pulmonary disease) ([place])   . Coronary artery disease   . Deafness   . Depression   . Diabetes mellitus without complication ([place])   . Hyperlipidemia   . Hypertension   . Melena   . Obesity   . Peripheral neuropathy ([place])   . Peripheral neuropathy ([name])    bil.hands and feet  . Skin cancer   . Umbilical hernia     HOSPITAL COURSE:  82 year old Caucasian gentleman history of type 2 diabetes insulin requiring coronary disease presenting with chest pain. Please review history and physical for details  1.Chest pain, central:  Patient clinically doing fine. Chest pain-free today. Troponins with no significant trending 0.10-0.13-0.11 Seen and evaluated by Dr. Laurelyn Sickle PA. Patient with history of coronary artery bypass grafting and recent cardiac catheterization on 05/10/2016 revealing moderate to severe disease otherwise patent grafts. They have recommended to discharge patient to follow-up with them tomorrow October 26 to discuss stress test versus CCTA Continue patient's home medications Ranexa 500 mg twice a day and isosorbide 30 mg once daily  patient is comfortable to go home    2. Type 2 diabetes insulin requiring continue basal insulin and sliding scale coverage hold oral agents  3. Essential hypertension: Lopressor  4. COPD, chronic not in acute exacerbation continue home medications  5. History of gallbladder  illness-family states he previously had issues with gallbladder requiring multiple  hospitalizations, per history patient has not been complaining of any issues related to biliary colic at this time. Recommended patient to follow-up with Dr. Dahlia Byes as scheduled during first week of November for possible cholecystectomy    DISCHARGE CONDITIONS:   fair  CONSULTS OBTAINED:  Treatment Team:  Lytle Butte, MD [name], MD   PROCEDURES  None DRUG ALLERGIES:  No Known Allergies  DISCHARGE MEDICATIONS:   Current Discharge Medication List    START taking these medications   Details  isosorbide mononitrate (IMDUR) 30 MG 24 hr tablet Take 1 tablet (30 mg total) by mouth daily. Qty: 30 tablet, Refills: 0      CONTINUE these medications which have NOT CHANGED   Details  acetaminophen (TYLENOL) 650 MG CR tablet Take 650 mg by mouth 2 (two) times daily.     aspirin EC 81 MG tablet Take 81 mg by mouth daily.    clopidogrel (PLAVIX) 75 MG tablet Take 1 tablet (75 mg total) by mouth daily. Qty: 30 tablet, Refills: 0    FLUoxetine (PROZAC) 20 MG capsule Take 20 mg by mouth daily.    furosemide (LASIX) 40 MG tablet Take 1 tablet (40 mg total) by mouth daily. Qty: 10 tablet, Refills: 0    insulin glargine (LANTUS) 100 UNIT/ML injection Inject 12 Units into the skin at bedtime.     lovastatin (MEVACOR) 20 MG tablet Take 20 mg by mouth at bedtime.    metFORMIN (GLUCOPHAGE) 500 MG tablet Take 500 mg  by mouth 2 (two) times daily.     metoprolol succinate (TOPROL-XL) 25 MG 24 hr tablet Take 25 mg by mouth daily.    pantoprazole (PROTONIX) 40 MG tablet Take 40 mg by mouth daily.     Polyethylene Glycol 3350 (MIRALAX PO) Take 17 g by mouth daily.    ranolazine (RANEXA) 500 MG 12 hr tablet Take 500 mg by mouth 2 (two) times daily.     tamsulosin (FLOMAX) 0.4 MG CAPS capsule Take 1 capsule (0.4 mg total) by mouth daily after supper. Qty: 30 capsule, Refills: 12    Tiotropium  Bromide Monohydrate (SPIRIVA RESPIMAT) 2.5 MCG/ACT AERS Inhale 2 puffs into the lungs See admin instructions. Inhale 2 puffs 1 to 2 times a day.    VENTOLIN HFA 108 (90 BASE) MCG/ACT inhaler Inhale 1-2 puffs into the lungs as needed. Reported on 12/16/2015    nitroGLYCERIN (NITROSTAT) 0.4 MG SL tablet Place 1 tablet under the tongue every 5 (five) minutes as needed. Reported on 12/16/2015         DISCHARGE INSTRUCTIONS:   Continue 2 liters of home oxygen daily at bedtime Follow-up with primary care physician in a week Follow-up with cardiology Dr. Humphrey Rolls as scheduled on October 26 at 1 PM Follow-up with surgery Dr. Dahlia Byes as scheduled during first week of November   DIET:  Cardiac diet, Diabetic  DISCHARGE CONDITION:  Fair  ACTIVITY:  Activity as tolerated  OXYGEN:  Home Oxygen: Yes.     Oxygen Delivery: 2 liters/min via Patient connected to nasal cannula oxygen daily at bedtime  DISCHARGE LOCATION:  home   If you experience worsening of your admission symptoms, develop shortness of breath, life threatening emergency, suicidal or homicidal thoughts you must seek medical attention immediately by calling 911 or calling your MD immediately  if symptoms less severe.  You Must read complete instructions/literature along with all the possible adverse reactions/side effects for all the Medicines you take and that have been prescribed to you. Take any new Medicines after you have completely understood and accpet all the possible adverse reactions/side effects.   Please note  You were cared for by a hospitalist during your hospital stay. If you have any questions about your discharge medications or the care you received while you were in the hospital after you are discharged, you can call the unit and asked to speak with the hospitalist on call if the hospitalist that took care of you is not available. Once you are discharged, your primary care physician will handle any further medical  issues. Please note that NO REFILLS for any discharge medications will be authorized once you are discharged, as it is imperative that you return to your primary care physician (or establish a relationship with a primary care physician if you do not have one) for your aftercare needs so that they can reassess your need for medications and monitor your lab values.     Today  Chief Complaint  Patient presents with  . Chest Pain   Patient is resting comfortably. Denies any chest pain. No other complaints. Wife and son at bedside  ROS:  CONSTITUTIONAL: Denies fevers, chills. Denies any fatigue, weakness.  EYES: Denies blurry vision, double vision, eye pain. EARS, NOSE, THROAT: Denies tinnitus, ear pain, hearing loss. RESPIRATORY: Denies cough, wheeze, shortness of breath.  CARDIOVASCULAR: Denies chest pain, palpitations, edema.  GASTROINTESTINAL: Denies nausea, vomiting, diarrhea, abdominal pain. Denies bright red blood per rectum. GENITOURINARY: Denies dysuria, hematuria. ENDOCRINE: Denies nocturia or thyroid problems.  HEMATOLOGIC AND LYMPHATIC: Denies easy bruising or bleeding. SKIN: Denies rash or lesion. MUSCULOSKELETAL: Denies pain in neck, back, shoulder, knees, hips or arthritic symptoms.  NEUROLOGIC: Denies paralysis, paresthesias.  PSYCHIATRIC: Denies anxiety or depressive symptoms.   VITAL SIGNS:  Blood pressure (!) 133/53, pulse 68, temperature 97.6 F (36.4 C), resp. rate 17, height 6\' 3"  (1.905 m), weight 96.6 kg (212 lb 14.4 oz), SpO2 96 %.  I/O:    Intake/Output Summary (Last 24 hours) at 09/22/16 1307 Last data filed at 09/22/16 0950  Gross per 24 hour  Intake              240 ml  Output             1175 ml  Net             -935 ml    PHYSICAL EXAMINATION:  GENERAL:  82 y.o.-year-old patient lying in the bed with no acute distress.  EYES: Pupils equal, round, reactive to light and accommodation. No scleral icterus. Extraocular muscles intact.  HEENT: Head  atraumatic, normocephalic. Oropharynx and nasopharynx clear.  NECK:  Supple, no jugular venous distention. No thyroid enlargement, no tenderness.  LUNGS: Normal breath sounds bilaterally, no wheezing, rales,rhonchi or crepitation. No use of accessory muscles of respiration.  CARDIOVASCULAR: S1, S2 normal. No murmurs, rubs, or gallops.  ABDOMEN: Soft, non-tender, non-distended. Bowel sounds present. No organomegaly or mass.  EXTREMITIES: No pedal edema, cyanosis, or clubbing.  NEUROLOGIC: Cranial nerves II through XII are intact. Muscle strength 5/5 in all extremities. Sensation intact. Gait not checked.  PSYCHIATRIC: The patient is alert and oriented x 3.  SKIN: No obvious rash, lesion, or ulcer.   DATA REVIEW:   CBC  Recent Labs Lab 09/22/16 0715  WBC 13.9*  HGB 10.2*  HCT 29.5*  PLT 155    Chemistries   Recent Labs Lab 09/21/16 1103  NA 138  K 4.3  CL 105  CO2 27  GLUCOSE 104*  BUN 24*  CREATININE 1.30*  CALCIUM 9.9  AST 44*  ALT 22  ALKPHOS 85  BILITOT 1.1    Cardiac Enzymes  Recent Labs Lab 09/22/16 0715  TROPONINI 0.11*    Microbiology Results  Results for orders placed or performed during the hospital encounter of 05/09/16  Blood culture (routine single)     Status: None   Collection Time: 05/09/16 11:03 AM  Result Value Ref Range Status   Specimen Description BLOOD RIGHT ARM  Final   Special Requests BOTTLES DRAWN AEROBIC AND ANAEROBIC  2CC  Final   Culture NO GROWTH 5 DAYS  Final   Report Status 05/14/2016 FINAL  Final  Urine culture     Status: Abnormal   Collection Time: 05/09/16  6:45 PM  Result Value Ref Range Status   Specimen Description URINE, RANDOM  Final   Special Requests NONE  Final   Culture MULTIPLE SPECIES PRESENT, SUGGEST RECOLLECTION (A)  Final   Report Status 05/11/2016 FINAL  Final    RADIOLOGY:  Dg Chest 2 View  Result Date: 09/21/2016 CLINICAL DATA:  Chest pain. EXAM: CHEST  2 VIEW COMPARISON:  Radiographs of May 27, 2016. FINDINGS: The heart size and mediastinal contours are within normal limits. Both lungs are clear. No pneumothorax or pleural effusion is noted. Status post coronary artery bypass graft. Atherosclerosis of thoracic aorta is noted. The visualized skeletal structures are unremarkable. IMPRESSION: No active cardiopulmonary disease.  Aortic atherosclerosis. Electronically Signed   By: Marijo Conception,  M.D.   On: 09/21/2016 11:45    EKG:   Orders placed or performed during the hospital encounter of 09/21/16  . ED EKG within 10 minutes  . ED EKG within 10 minutes      Management plans discussed with the patient, family and they are in agreement.  CODE STATUS:     Code Status Orders        Start     Ordered   09/21/16 1605  Full code  Continuous     09/21/16 1605    Code Status History    Date Active Date Inactive Code Status Order ID Comments User Context   05/09/2016  4:17 PM 05/12/2016  4:29 PM Full Code [id number]  Idelle Crouch, MD Inpatient   11/05/2015  6:38 AM 11/14/2015  7:39 PM Full Code [id number]  Harrie Foreman, MD Inpatient   07/17/2015  4:14 PM 07/18/2015  4:39 PM Full Code [id number]  [name], MD Inpatient   07/17/2015  3:41 PM 07/17/2015  4:14 PM Full Code [id number]  [name], MD Inpatient   07/15/2015  5:04 PM 07/17/2015  3:41 PM Full Code [id number]  Demetrios Loll, MD Inpatient    Advance Directive Documentation   [place] Most Recent Value  Type of Advance Directive  Living will  Pre-existing out of facility DNR order (yellow form or pink MOST form)  No data  "MOST" Form in Place?  No data      TOTAL TIME TAKING CARE OF THIS PATIENT: 43 minutes.   Note: This dictation was prepared with Dragon dictation along with smaller phrase technology. Any transcriptional errors that result from this process are unintentional.   @[facility] @  on 09/22/2016 at 1:07 PM  Between 7am to 6pm - Pager - [phone]  After 6pm go to [email or web address] - password [email or web address]  [facility]  Office  [phone]  CC: Primary care physician; [name], MD

## 2016-09-22 NOTE — Discharge Instructions (Signed)
Continue 2 liters of home oxygen daily at bedtime Follow-up with primary care physician in a week Follow-up with cardiology Dr. Humphrey Rolls as scheduled on October 26 at 1 PM Follow-up with surgery Dr. Dahlia Byes as scheduled during first week of November

## 2016-09-22 NOTE — Care Management (Signed)
Patient was energized and said that he felt much better and had not been experiencing any chest pain since first arriving in our care. He asked for prayer and we prayed, he also mentioned that Justin Justin had come by yesterday during the afternoon to see him.

## 2016-09-22 NOTE — Care Management Obs Status (Signed)
[facility] NOTIFICATION   Patient Details  Name: PAULINE MITRO MRN: [id number] Date of Birth: [birth year]   Medicare Observation Status Notification Given:  Yes    Beau Fanny, RN 09/22/2016, 8:55 AM

## 2016-09-22 NOTE — Care Management (Signed)
Patient placed in  observation with chest pain.  Troponins are slightly elevated but do not appear to be trending upward.  Cardiology consult pending.    Patient presents from home and independent in all adls.   No issues accessing medical care, obtaining medications, maintaining housing, utilities and food.  has access to cane and walker. Sons provide support.  No discharge needs identified at present time.

## 2016-09-30 ENCOUNTER — Other Ambulatory Visit: Payer: Self-pay | Admitting: Family Medicine

## 2016-09-30 DIAGNOSIS — K8020 Calculus of gallbladder without cholecystitis without obstruction: Secondary | ICD-10-CM

## 2016-10-04 ENCOUNTER — Ambulatory Visit: Payer: Medicare Other | Admitting: Family

## 2016-10-06 ENCOUNTER — Ambulatory Visit
Admission: RE | Admit: 2016-10-06 | Discharge: 2016-10-06 | Disposition: A | Discharge status: Home / Self Care | Payer: Medicare Other | Source: Ambulatory Visit | Attending: Family Medicine | Admitting: Family Medicine

## 2016-10-06 ENCOUNTER — Ambulatory Visit: Payer: Self-pay | Admitting: Surgery

## 2016-10-06 DIAGNOSIS — K8020 Calculus of gallbladder without cholecystitis without obstruction: Secondary | ICD-10-CM | POA: Insufficient documentation

## 2016-10-06 DIAGNOSIS — R509 Fever, unspecified: Secondary | ICD-10-CM | POA: Insufficient documentation

## 2016-10-07 ENCOUNTER — Encounter: Payer: Self-pay | Admitting: Surgery

## 2016-10-07 ENCOUNTER — Ambulatory Visit (INDEPENDENT_AMBULATORY_CARE_PROVIDER_SITE_OTHER): Payer: Medicare Other | Admitting: Surgery

## 2016-10-07 VITALS — BP 138/49 | HR 65 | Temp 98.2°F | Ht 75.0 in | Wt 217.4 lb

## 2016-10-07 DIAGNOSIS — K811 Chronic cholecystitis: Secondary | ICD-10-CM | POA: Diagnosis not present

## 2016-10-07 NOTE — Patient Instructions (Signed)
Please speak with your family about your decision to have gallbladder surgery or not and call when you have come to a decision.  Laparoscopic Cholecystectomy Laparoscopic cholecystectomy is surgery to remove the gallbladder. The gallbladder is located in the upper right part of the abdomen, behind the liver. It is a storage sac for bile, which is produced in the liver. Bile aids in the digestion and absorption of fats. Cholecystectomy is often done for inflammation of the gallbladder (cholecystitis). This condition is usually caused by a buildup of gallstones (cholelithiasis) in the gallbladder. Gallstones can block the flow of bile, and that can result in inflammation and pain. In severe cases, emergency surgery may be required. If emergency surgery is not required, you will have time to prepare for the procedure. Laparoscopic surgery is an alternative to open surgery. Laparoscopic surgery has a shorter recovery time. Your common bile duct may also need to be examined during the procedure. If stones are found in the common bile duct, they may be removed. LET [facility] CARE PROVIDER KNOW ABOUT:  Any allergies you have.  All medicines you are taking, including vitamins, herbs, eye drops, creams, and over-the-counter medicines.  Previous problems you or members of your family have had with the use of anesthetics.  Any blood disorders you have.  Previous surgeries you have had.                          Any medical conditions you have. RISKS AND COMPLICATIONS Generally, this is a safe procedure. However, problems may occur, including:  Infection.  Bleeding.  Allergic reactions to medicines.  Damage to other structures or organs.  A stone remaining in the common bile duct.  A bile leak from the cyst duct that is clipped when your gallbladder is removed.  The need to convert to open surgery, which requires a larger incision in the abdomen. This may be necessary if  your surgeon thinks that it is not safe to continue with a laparoscopic procedure. BEFORE THE PROCEDURE  Ask your health care provider about:  Changing or stopping your regular medicines. This is especially important if you are taking diabetes medicines or blood thinners.  Taking medicines such as aspirin and ibuprofen. These medicines can thin your blood. Do not take these medicines before your procedure if your health care provider instructs you not to.  Follow instructions from your health care provider about eating or drinking restrictions.  Let your health care provider know if you develop a cold or an infection before surgery.  Plan to have someone take you home after the procedure.  Ask your health care provider how your surgical site will be marked or identified.  You may be given antibiotic medicine to help prevent infection. PROCEDURE  To reduce your risk of infection:  Your health care team will wash or sanitize their hands.  Your skin will be washed with soap.  An IV tube may be inserted into one of your veins.  You will be given a medicine to make you fall asleep (general anesthetic).  A breathing tube will be placed in your mouth.  The surgeon will make several small cuts (incisions) in your abdomen.  A thin, lighted tube (laparoscope) that has a tiny camera on the end will be inserted through one of the small incisions. The camera on the laparoscope will send a picture to a TV screen (monitor) in the operating room. This will give the surgeon a  good view inside your abdomen.  A gas will be pumped into your abdomen. This will expand your abdomen to give the surgeon more room to perform the surgery.  Other tools that are needed for the procedure will be inserted through the other incisions. The gallbladder will be removed through one of the incisions.  After your gallbladder has been removed, the incisions will be closed with stitches (sutures), staples, or skin  glue.  Your incisions may be covered with a bandage (dressing). The procedure may vary among health care providers and hospitals. AFTER THE PROCEDURE  Your blood pressure, heart rate, breathing rate, and blood oxygen level will be monitored often until the medicines you were given have worn off.  You will be given medicines as needed to control your pain.   This information is not intended to replace advice given to you by your health care provider. Make sure you discuss any questions you have with your health care provider.   Document Released: 11/15/2005 Document Revised: 08/06/2015 Document Reviewed: 06/27/2013 Elsevier Interactive Patient Education 2016 [employer]      Open Cholecystectomy Open cholecystectomy is surgery to remove the gallbladder. The gallbladder is located in the upper right part of the abdomen, behind the liver. It is a storage sac for the bile produced in the liver. Bile aids in the digestion and absorption of fats. Cholecystectomy is often done for inflammation of the gallbladder (cholecystitis). This condition is usually caused by a buildup of gallstones (cholelithiasis) in your gallbladder. Gallstones can block the flow of bile, resulting in inflammation, pain, and possibly infection. In severe cases, emergency surgery may be required. When emergency surgery is not required, you will have time to prepare for the procedure. LET [facility] CARE PROVIDER KNOW ABOUT:  Any allergies you have.  All medicines you are taking, including vitamins, herbs, eye drops, creams, and over-the-counter medicines.  Previous problems you or members of your family have had with the use of anesthetics.  Any blood disorders you have.  Previous surgeries you have had.  Medical conditions you have. RISKS AND COMPLICATIONS Generally, this is a safe procedure. However, as with any procedure, complications can occur. Possible complications include:  Infection.  Damage to  thecommon bile duct, nerves, arteries, veins, or other internal organs such as the stomach or intestines.  Bleeding after surgery.  A stone may remain in the common bile duct. BEFORE THE PROCEDURE  Ask your health care provider about changing or stopping any regular medicines. You will need to stop taking aspirin or blood thinners at least 5 days prior to surgery.  Do not eat or drink anything after midnight the night before surgery.  Let your health care provider know if you develop a cold or other infectious problem before surgery. PROCEDURE   You will be given medicine that makes you sleep through the procedure (general anesthetic).  When you are asleep, your surgeon will make a cut (incision) in the upper abdomen to access your gallbladder.  The surgeon will examine the inside of your abdomen and make sure there are no other problems. Your gallbladder will then be removed.  An X-ray exam of your common bile duct may be done to look for stones that may have fallen into this duct.  After the removal of your gallbladder, your abdomen will be closed with stitches (sutures) or staples. AFTER THE PROCEDURE  You will be taken to a recovery area where your progress will be checked often.  You will likely  need to stay in the hospital for 3-5 days.   This information is not intended to replace advice given to you by your health care provider. Make sure you discuss any questions you have with your health care provider.   Document Released: 08/07/2002 Document Revised: 09/05/2013 Document Reviewed: 06/27/2013 Elsevier Interactive Patient Education [employer].

## 2016-10-08 ENCOUNTER — Telehealth: Payer: Self-pay

## 2016-10-08 NOTE — Telephone Encounter (Signed)
Faxed both clearances and request for Stress and Last office note sent over at this time.

## 2016-10-08 NOTE — Telephone Encounter (Signed)
Patient's wife, called in and explained that they had a discussion with the entire family last night and that they have agreed to proceed with surgery despite the significant cardiac/pulmonary risk that may be associated with this patient.  Spoke with Dr. Dahlia Byes on this matter.  Patient has been scheduled for surgery on 11/04/16.   An additional cardiac clearance and anticoagulant clearance has been sent to patient's cardiologist, Dr. Dan Europe at this time. The previous cardiac clearance that we received was prior to patient's last admission for Chest Pain. We have also requested last stress test results and last office note from Dr. Humphrey Rolls to ensure that patient is optimized from cardiac standpoint prior to doing surgery on 12/7.  Will await all requested documents and then call patient back with decision on Plavix/ASA and clearance.

## 2016-10-08 NOTE — Progress Notes (Signed)
Patient ID: Justin Leonard, male   DOB: Jul 22, 1927, 82 y.o.   MRN: [id number]  HPI Justin Leonard is a 82 y.o. male  well-known patient to our practice with  chronic cholecystitis with recurrent episodes. At some point in time he did have a cholecystostomy tube at that time he was decompensated from a medical perspective and was not a surgical candidate. A few months ago he was evaluated by cardiology initially his troponins went up but cardiology determined that there was no evidence of myocardial infarction but rather troponin leak from the mild ischemia. He is cardiac cath showed no evidence of treatable stenosis and  Medical management was recommended. He also has a history of diabetes, CHF, COPD and wears oxygen only at night. He walks with a walker and today he came walking on his own without any dyspnea. We'll recently he came to the emergency room for chest pain and there was a very subtle elevation of his troponin's. Cardiology evaluated him again and apparently did a stress test in the outpatient setting. Unfortunately do not have any records pertinent pertaining his last stress test nor the last cardiology note. Per the patient's reports he stated that he passed had a stress test. He currently is symptom free with no angina or dyspnea. He does have intermittent abdominal pain and nausea related to his chronic cholecystitis. He is on both Plavix and aspirin. We actually have received pulmonary clearance and we did receive cardiac clearance but unfortunately this was before his most recent anginal episode. No evidence of biliary structure or cholangitis. Findings did have fever and was managed with antibiotics empirically and he responded well more likely we thought this was another attack of cholecystitis. Currently is symptom free   HPI      Past Medical History:  Diagnosis Date  . Back pain   . CHF (congestive heart failure) ([place])   . COPD (chronic obstructive pulmonary disease) ([place])    . Coronary artery disease   . Deafness   . Depression   . Diabetes mellitus without complication ([place])   . Hyperlipidemia   . Hypertension   . Melena   . Obesity   . Peripheral neuropathy ([place])   . Peripheral neuropathy ([name])    bil.hands and feet  . Skin cancer   . Umbilical hernia     Past Surgical History:  Procedure Laterality Date  . CARDIAC CATHETERIZATION Left 07/17/2015   Procedure: Right/Left Heart Cath and Coronary Angiography;  Surgeon: [name], MD;  Location: [place] [facility];  Service: Cardiovascular;  Laterality: Left;  . CARDIAC CATHETERIZATION N/A 07/17/2015   Procedure: Coronary Stent Intervention;  Surgeon: [name], MD;  Location: [place] [facility];  Service: Cardiovascular;  Laterality: N/A;  . CARDIAC CATHETERIZATION Right 05/10/2016   Procedure: Left Heart Cath and Coronary Angiography;  Surgeon: [name], MD;  Location: [place] [facility];  Service: Cardiovascular;  Laterality: Right;  . COLONOSCOPY WITH PROPOFOL N/A 12/12/2015   Procedure: COLONOSCOPY WITH PROPOFOL;  Surgeon: [name], MD;  Location: [facility] ENDOSCOPY;  Service: Gastroenterology;  Laterality: N/A;  . CORONARY ANGIOPLASTY    . CORONARY ARTERY BYPASS GRAFT    . ESOPHAGOGASTRODUODENOSCOPY N/A 11/10/2015   Procedure: ESOPHAGOGASTRODUODENOSCOPY (EGD);  Surgeon: [name], MD;  Location: [facility] ENDOSCOPY;  Service: Endoscopy;  Laterality: N/A;  . gall bladder drain      Family History  Problem Relation Age of Onset  . Hypertension Father   .  CVA Father   . Anemia Neg Hx   . Arrhythmia Neg Hx   . Asthma Neg Hx   . Clotting disorder Neg Hx   . Fainting Neg Hx   . Heart attack Neg Hx   . Heart disease Neg Hx   . Heart failure Neg Hx   . Hyperlipidemia Neg Hx     Social History Social History  Substance Use Topics  . Smoking status: Former Smoker    Packs/day: 3.00    Years: 30.00    Types: Cigarettes    Quit date: 04/16/1978  . Smokeless tobacco: Never  Used     Comment: Quit smoking 1979  . Alcohol use No    No Known Allergies  Current Outpatient Prescriptions  Medication Sig Dispense Refill  . acetaminophen (TYLENOL) 650 MG CR tablet Take 650 mg by mouth 2 (two) times daily.     Marland Kitchen aspirin EC 81 MG tablet Take 81 mg by mouth daily.    . clopidogrel (PLAVIX) 75 MG tablet Take 1 tablet (75 mg total) by mouth daily. 30 tablet 0  . FLUoxetine (PROZAC) 20 MG capsule Take 20 mg by mouth daily.    . furosemide (LASIX) 40 MG tablet Take 1 tablet (40 mg total) by mouth daily. 10 tablet 0  . insulin glargine (LANTUS) 100 UNIT/ML injection Inject 12 Units into the skin at bedtime.     . isosorbide mononitrate (IMDUR) 30 MG 24 hr tablet Take 1 tablet (30 mg total) by mouth daily. 30 tablet 0  . lovastatin (MEVACOR) 20 MG tablet Take 20 mg by mouth at bedtime.    . metFORMIN (GLUCOPHAGE) 500 MG tablet Take 500 mg by mouth 2 (two) times daily.     . metoprolol succinate (TOPROL-XL) 25 MG 24 hr tablet Take 25 mg by mouth daily.    . nitroGLYCERIN (NITROSTAT) 0.4 MG SL tablet Place 0.4 mg under the tongue every 5 (five) minutes as needed for chest pain.    . pantoprazole (PROTONIX) 40 MG tablet Take 40 mg by mouth daily.     . Polyethylene Glycol 3350 (MIRALAX PO) Take 17 g by mouth daily.    . ranolazine (RANEXA) 500 MG 12 hr tablet Take 500 mg by mouth 2 (two) times daily.     . tamsulosin (FLOMAX) 0.4 MG CAPS capsule Take 1 capsule (0.4 mg total) by mouth daily after supper. 30 capsule 12  . Tiotropium Bromide Monohydrate (SPIRIVA RESPIMAT) 2.5 MCG/ACT AERS Inhale 2 puffs into the lungs See admin instructions. Inhale 2 puffs 1 to 2 times a day.    . VENTOLIN HFA 108 (90 BASE) MCG/ACT inhaler Inhale 1-2 puffs into the lungs as needed. Reported on 12/16/2015     No current facility-administered medications for this visit.      Review of Systems A 10 point review of systems was asked and was negative except for the information on the HPI  Physical  Exam Blood pressure (!) 138/49, pulse 65, temperature 98.2 F (36.8 C), temperature source Oral, height 6\' 3"  (1.905 m), weight 98.6 kg (217 lb 6.4 oz). CONSTITUTIONAL: Elderly male in no acute distress walks with a cane, without any dyspnea EYES: Pupils are equal, round, and reactive to light, Sclera are non-icteric. EARS, NOSE, MOUTH AND THROAT: The oropharynx is clear. The oral mucosa is pink and moist. Hearing is intact to voice. LYMPH NODES:  Lymph nodes in the neck are normal. RESPIRATORY:  Lungs are clear. There is normal respiratory effort, with  equal breath sounds bilaterally, and without pathologic use of accessory muscles. CARDIOVASCULAR: Heart is regular without murmurs, gallops, or rubs. GI: The abdomen is  soft, nontender, and nondistended. There are no palpable masses. There is no hepatosplenomegaly. There are normal bowel sounds in all quadrants. GU: Rectal deferred.   MUSCULOSKELETAL: Normal muscle strength and tone. No cyanosis or edema.   SKIN: Turgor is good and there are no pathologic skin lesions or ulcers. NEUROLOGIC: Motor and sensation is grossly normal. Cranial nerves are grossly intact. PSYCH:  Oriented to person, place and time. Affect is normal.  Data Reviewed  I have personally reviewed the patient's imaging, laboratory findings and medical records.    Assessment/Plan 82 year old with multiple comorbidity mainly with significant coronary artery disease and chronic cholecystitis with recurrent symptoms. I have followed him this patient for several months now and unfortunately keeps having recurrent attacks for chronic cholecystitis. Unfortunately he also had had some angina episode that prompted hospitalization. This is a very challenging circumstance because of the fact that he is A significant cardiac history and also has a significant gallbladder abnormality that is causing significant pain and suffering. I have a lengthy discussion with the patient and with his  wife and about his current circumstances. He is definitely at higher risk for developing complications given his multiple comorbidities but on the other hand if we do not do anything his quality of life and his pain will be worsening. I have given the option for him off for potential cholecystectomy or on the antibiotic management. He is very frustrated and states that he has had a good life but he does not wish to have recurrent cholecystitis attacks. And I do think that from a medical perspective he has improved significantly over the last 8 months or so and he is probably in the best shape he can be. And to be on the safe side I am going to request again and a preop clearance from cardiology given that he had a most recent anginal attack and I don't have any recent stress test that apparently he had done. I will also asked him about and perioperative management of Plavix because we will definitely need to stop that for about 7 days. After we obtain all the clearance and he states that he is willing to undergo a laparoscopic cholecystectomy regardless of the consequences. I have explained to the patient the potential risk including but not limited to: MIs, strokes, death, common bile duct injury, infection, bleeding and prolonged hospitalization. I have also explained to him the potential for open cholecystectomy given the chronicity and inflammatory changes around his gallbladder. He is actually thought this very thoroughly and is ready to have a cholecystectomy done. Again we will wait final cardiology clearance but we'll tentatively put him on the schedule for December for a laparoscopic cholecystectomy possible open I have also discussed with him that given his comorbidities would be prudent for him to stay overnight to make sure that he is overall condition is improving. I have spent at least 45-50 minutes in this encounter with the majority of time spent in counseling and coordinating his care. I have also  offered him to be transferred to the tertiary center but he is adamant that he was to stay here  Caroleen Hamman, MD [place] Surgeon 10/08/2016, 11:36 AM

## 2016-10-11 ENCOUNTER — Telehealth: Payer: Self-pay

## 2016-10-11 ENCOUNTER — Encounter: Payer: Self-pay | Admitting: Family

## 2016-10-11 ENCOUNTER — Ambulatory Visit: Payer: Medicare Other | Attending: Family | Admitting: Family

## 2016-10-11 VITALS — BP 130/48 | HR 59 | Resp 18 | Ht 74.0 in | Wt 214.0 lb

## 2016-10-11 DIAGNOSIS — Z951 Presence of aortocoronary bypass graft: Secondary | ICD-10-CM | POA: Diagnosis not present

## 2016-10-11 DIAGNOSIS — I5032 Chronic diastolic (congestive) heart failure: Secondary | ICD-10-CM | POA: Diagnosis not present

## 2016-10-11 DIAGNOSIS — R1011 Right upper quadrant pain: Secondary | ICD-10-CM

## 2016-10-11 DIAGNOSIS — J449 Chronic obstructive pulmonary disease, unspecified: Secondary | ICD-10-CM | POA: Diagnosis present

## 2016-10-11 DIAGNOSIS — Z794 Long term (current) use of insulin: Secondary | ICD-10-CM

## 2016-10-11 DIAGNOSIS — F329 Major depressive disorder, single episode, unspecified: Secondary | ICD-10-CM | POA: Diagnosis not present

## 2016-10-11 DIAGNOSIS — F17200 Nicotine dependence, unspecified, uncomplicated: Secondary | ICD-10-CM | POA: Insufficient documentation

## 2016-10-11 DIAGNOSIS — E119 Type 2 diabetes mellitus without complications: Secondary | ICD-10-CM

## 2016-10-11 DIAGNOSIS — I2510 Atherosclerotic heart disease of native coronary artery without angina pectoris: Secondary | ICD-10-CM | POA: Diagnosis not present

## 2016-10-11 DIAGNOSIS — E785 Hyperlipidemia, unspecified: Secondary | ICD-10-CM | POA: Diagnosis not present

## 2016-10-11 DIAGNOSIS — I110 Hypertensive heart disease with heart failure: Secondary | ICD-10-CM | POA: Insufficient documentation

## 2016-10-11 DIAGNOSIS — E1140 Type 2 diabetes mellitus with diabetic neuropathy, unspecified: Secondary | ICD-10-CM | POA: Diagnosis not present

## 2016-10-11 DIAGNOSIS — R1012 Left upper quadrant pain: Secondary | ICD-10-CM

## 2016-10-11 DIAGNOSIS — I2721 Secondary pulmonary arterial hypertension: Secondary | ICD-10-CM

## 2016-10-11 DIAGNOSIS — I10 Essential (primary) hypertension: Secondary | ICD-10-CM

## 2016-10-11 NOTE — Telephone Encounter (Signed)
Patient's wife called in and states that patient needs an assessment of his lungs in order to requalify for his PRN oxygen use at home. I explained that he would need to call Dr. Gust Brooms office this morning to get this done. She verbalizes understanding of this.

## 2016-10-11 NOTE — Telephone Encounter (Signed)
I have called patient to advise him of his surgery information below. No answer. I have left a message on voicemail for patient to call back.    pre op date/time and sx date. Sx: 11/04/16 with Dr Pabon--Dr Genevive Bi assisting--Laparoscopic cholecystectomy-possible open.  Pre op: 10/25/16 @ 9:45am--Office.   Patient made aware to call [phone], between 1-3:00pm the day before surgery, to find out what time to arrive.

## 2016-10-11 NOTE — Patient Instructions (Signed)
Continue weighing daily and call for an overnight weight gain of > 2 pounds or a weekly weight gain of >5 pounds. 

## 2016-10-11 NOTE — Progress Notes (Signed)
Patient ID: Justin Leonard, male    DOB: 09/19/1927, 82 y.o.   MRN: [id number]  HPI  Justin Leonard is a 82 y/o male with a history of neuropathy, HTN, hyperlipidemia, DM, depression, CAD with CABG, COPD, remote tobacco use and chronic heart failure.   Last echo was done 05/10/16 which showed an EF of 40% with mild Justin and PA pressure of 48 mm Hg. EF has decreased from a previous echo done on 07/16/15 when the EF was 50-55%. Had a cardiac catheterization done on 05/10/16 which showed patent grafts although there was a blockage distal to his stent & recommended aggressive medical therapy.  Was last admitted on 09/21/16 with chest pain. Troponins were negative and cardiology consult was done. Was discharged the next day. Prior to that on 05/27/16 he was in the ER with chest pain treated and released the same day.   He presents today for a follow-up visit with fatigue and shortness of breath upon exertion. Denies any swelling in his legs/abdomen. Continues to weigh daily and has continued to lose some weight. Not adding salt to his food. Is being worked up for gallbladder surgery with a tentative date for this scheduled for 11/04/16.  Past Medical History:  Diagnosis Date  . Back pain   . CHF (congestive heart failure) ([place])   . COPD (chronic obstructive pulmonary disease) ([place])   . Coronary artery disease   . Deafness   . Depression   . Diabetes mellitus without complication ([place])   . Hyperlipidemia   . Hypertension   . Melena   . Obesity   . Peripheral neuropathy ([place])   . Peripheral neuropathy ([name])    bil.hands and feet  . Skin cancer   . Umbilical hernia     Past Surgical History:  Procedure Laterality Date  . CARDIAC CATHETERIZATION Left 07/17/2015   Procedure: Right/Left Heart Cath and Coronary Angiography;  Surgeon: [name], MD;  Location: [place] [facility];  Service: Cardiovascular;  Laterality: Left;  . CARDIAC CATHETERIZATION N/A 07/17/2015   Procedure: Coronary Stent  Intervention;  Surgeon: [name], MD;  Location: [place] [facility];  Service: Cardiovascular;  Laterality: N/A;  . CARDIAC CATHETERIZATION Right 05/10/2016   Procedure: Left Heart Cath and Coronary Angiography;  Surgeon: [name], MD;  Location: [place] [facility];  Service: Cardiovascular;  Laterality: Right;  . COLONOSCOPY WITH PROPOFOL N/A 12/12/2015   Procedure: COLONOSCOPY WITH PROPOFOL;  Surgeon: [name], MD;  Location: [facility] ENDOSCOPY;  Service: Gastroenterology;  Laterality: N/A;  . CORONARY ANGIOPLASTY    . CORONARY ARTERY BYPASS GRAFT    . ESOPHAGOGASTRODUODENOSCOPY N/A 11/10/2015   Procedure: ESOPHAGOGASTRODUODENOSCOPY (EGD);  Surgeon: [name], MD;  Location: [facility] ENDOSCOPY;  Service: Endoscopy;  Laterality: N/A;  . gall bladder drain      Family History  Problem Relation Age of Onset  . Hypertension Father   . CVA Father   . Anemia Neg Hx   . Arrhythmia Neg Hx   . Asthma Neg Hx   . Clotting disorder Neg Hx   . Fainting Neg Hx   . Heart attack Neg Hx   . Heart disease Neg Hx   . Heart failure Neg Hx   . Hyperlipidemia Neg Hx     Social History  Substance Use Topics  . Smoking status: Former Smoker    Packs/day: 3.00    Years: 30.00    Types: Cigarettes    Quit date: 04/16/1978  .  Smokeless tobacco: Never Used     Comment: Quit smoking 1979  . Alcohol use No    No Known Allergies  Prior to Admission medications   Medication Sig Start Date End Date Taking? Authorizing Provider  acetaminophen (TYLENOL) 650 MG CR tablet Take 650 mg by mouth 2 (two) times daily.    Yes Historical Provider, MD  aspirin EC 81 MG tablet Take 81 mg by mouth daily.   Yes Historical Provider, MD  clopidogrel (PLAVIX) 75 MG tablet Take 1 tablet (75 mg total) by mouth daily. 05/12/16  Yes Vaughan Basta, MD  ferrous sulfate 325 (65 FE) MG tablet Take 325 mg by mouth daily with breakfast.   Yes Historical Provider, MD  FLUoxetine (PROZAC) 20 MG capsule Take 20 mg  by mouth daily.   Yes Historical Provider, MD  furosemide (LASIX) 40 MG tablet Take 1 tablet (40 mg total) by mouth daily. 11/27/15  Yes Carrie Mew, MD  insulin glargine (LANTUS) 100 UNIT/ML injection Inject 12 Units into the skin at bedtime.    Yes Historical Provider, MD  isosorbide mononitrate (IMDUR) 30 MG 24 hr tablet Take 1 tablet (30 mg total) by mouth daily. 09/22/16  Yes Nicholes Mango, MD  lovastatin (MEVACOR) 20 MG tablet Take 20 mg by mouth at bedtime.   Yes Historical Provider, MD  metFORMIN (GLUCOPHAGE) 500 MG tablet Take 500 mg by mouth 2 (two) times daily.    Yes Historical Provider, MD  metoprolol succinate (TOPROL-XL) 25 MG 24 hr tablet Take 25 mg by mouth daily.   Yes Historical Provider, MD  nitroGLYCERIN (NITROSTAT) 0.4 MG SL tablet Place 0.4 mg under the tongue every 5 (five) minutes as needed for chest pain.   Yes Historical Provider, MD  pantoprazole (PROTONIX) 40 MG tablet Take 40 mg by mouth daily.  08/26/15  Yes Historical Provider, MD  Polyethylene Glycol 3350 (MIRALAX PO) Take 17 g by mouth daily.   Yes Historical Provider, MD  ranolazine (RANEXA) 500 MG 12 hr tablet Take 500 mg by mouth 2 (two) times daily.    Yes Historical Provider, MD  tamsulosin (FLOMAX) 0.4 MG CAPS capsule Take 1 capsule (0.4 mg total) by mouth daily after supper. 12/16/15  Yes Roda Shutters, FNP  Tiotropium Bromide Monohydrate (SPIRIVA RESPIMAT) 2.5 MCG/ACT AERS Inhale 2 puffs into the lungs See admin instructions. Inhale 2 puffs 1 to 2 times a day.   Yes Historical Provider, MD  VENTOLIN HFA 108 (90 BASE) MCG/ACT inhaler Inhale 1-2 puffs into the lungs as needed. Reported on 12/16/2015 06/24/15  Yes Historical Provider, MD     Review of Systems  Constitutional: Positive for appetite change and fatigue.  HENT: Positive for hearing loss. Negative for congestion, rhinorrhea and sore throat.   Eyes: Negative.   Respiratory: Positive for shortness of breath. Negative for cough and chest  tightness.   Cardiovascular: Negative for chest pain, palpitations and leg swelling.  Gastrointestinal: Positive for abdominal pain. Negative for abdominal distention.  Endocrine: Negative.   Genitourinary: Negative.   Musculoskeletal: Negative for back pain and neck pain.  Skin: Negative.   Allergic/Immunologic: Negative.   Neurological: Negative for dizziness and light-headedness.  Hematological: Negative for adenopathy. Does not bruise/bleed easily.  Psychiatric/Behavioral: Positive for dysphoric mood. Negative for sleep disturbance. The patient is not nervous/anxious.     Vitals:   10/11/16 1116  BP: (!) 130/48  Pulse: (!) 59  Resp: 18  SpO2: 96%  Weight: 214 lb (97.1 kg)  Height: 6\' 2"  (  1.88 m)    Wt Readings from Last 3 Encounters:  10/11/16 214 lb (97.1 kg)  10/07/16 217 lb 6.4 oz (98.6 kg)  09/22/16 212 lb 14.4 oz (96.6 kg)    Lab Results  Component Value Date   CREATININE 1.30 (H) 09/21/2016   CREATININE 1.26 (H) 05/27/2016   CREATININE 1.17 05/12/2016     Physical Exam  Constitutional: He is oriented to person, place, and time. He appears well-developed and well-nourished.  HENT:  Head: Normocephalic and atraumatic.  Eyes: Conjunctivae are normal. Pupils are equal, round, and reactive to light.  Neck: Normal range of motion. Neck supple.  Cardiovascular: Regular rhythm.  Bradycardia present.   Pulmonary/Chest: Effort normal. He has no wheezes. He has no rales.  Abdominal: Soft. He exhibits no distension. There is no tenderness.  Musculoskeletal: He exhibits no edema or tenderness.  Neurological: He is alert and oriented to person, place, and time.  Skin: Skin is warm and dry.  Psychiatric: He has a normal mood and affect. His behavior is normal. Thought content normal.  Nursing note and vitals reviewed.  Assessment & Plan:  1: Chronic heart failure with preserved ejection fraction- - NYHA class II - euvolemic - has lost 9 pounds since he was here 6  months ago. Reminded to call for an overnight weight gain of >2 pounds or a weekly weight gain of >5 pounds.  - has recently seen his cardiologist Humphrey Rolls) - has received his flu vaccine for this season  2: HTN- - BP looks good today - continue medications at this time  3: Diabetes- - glucose this morning was 122 - last saw PCP on 09/28/16. Next appointment is on 12/06/16.  4: Abdominal pain- - is tentatively scheduled for gallbladder surgery on 11/04/16  5: Pulmonary artery hypertension- - most recent PA pressure reading was 48 mmg Hg in June 2017 - last saw pulmonologist on 06/30/16. Next appointment on 11/03/16   Return in 6 months or sooner for any questions/problems before then.

## 2016-10-12 ENCOUNTER — Telehealth: Payer: Self-pay

## 2016-10-12 NOTE — Telephone Encounter (Signed)
Cardiac Clearance is obtained from Dr Humphrey Rolls at this time and will be scanned under Media.  Clearance to stop Anti-Coagulant for surgery is obtained at this time from [place] and will be scanned under Media.

## 2016-10-13 DIAGNOSIS — I2721 Secondary pulmonary arterial hypertension: Secondary | ICD-10-CM | POA: Insufficient documentation

## 2016-10-18 NOTE — Telephone Encounter (Signed)
Both Cardiac and Anticoagulant clearance has been given by Dr. Dan Europe and is located in chart under media.  Patient will stop Plavix and ASA 5 days prior to scheduled surgery per cardiologist. This would make his last dose on 10/29/16 and hold medications 12/2 until surgery date (12/7).  Call made to patient at this time. No answer. Left information regarding his medication hold, on the answering machine and asked for a return phone call letting me know that he did get the message.

## 2016-10-19 NOTE — Telephone Encounter (Signed)
Patient's wife has called back and was given all information about Medication management. Patient's wife was informed to stop Plavix and ASA 5 days prior to scheduled surgery per cardiologist. This would make his last dose on 10/29/16 and hold medications 12/2 until surgery date (12/7).  Patient's wife understands all directions given.

## 2016-10-25 ENCOUNTER — Encounter
Admission: RE | Admit: 2016-10-25 | Discharge: 2016-10-25 | Disposition: A | Discharge status: Home / Self Care | Payer: Medicare Other | Source: Ambulatory Visit | Attending: Surgery | Admitting: Surgery

## 2016-10-25 ENCOUNTER — Other Ambulatory Visit: Payer: Self-pay

## 2016-10-25 DIAGNOSIS — K811 Chronic cholecystitis: Secondary | ICD-10-CM | POA: Insufficient documentation

## 2016-10-25 DIAGNOSIS — Z01812 Encounter for preprocedural laboratory examination: Secondary | ICD-10-CM | POA: Insufficient documentation

## 2016-10-25 HISTORY — DX: Acute myocardial infarction, unspecified: I21.9

## 2016-10-25 HISTORY — DX: Dizziness and giddiness: R42

## 2016-10-25 HISTORY — DX: Anemia, unspecified: D64.9

## 2016-10-25 HISTORY — DX: Unspecified osteoarthritis, unspecified site: M19.90

## 2016-10-25 HISTORY — DX: Gastro-esophageal reflux disease without esophagitis: K21.9

## 2016-10-25 HISTORY — DX: Hypoxemia: R09.02

## 2016-10-25 LAB — COMPREHENSIVE METABOLIC PANEL
ALBUMIN: 3.7 g/dL (ref 3.5–5.0)
ALK PHOS: 77 U/L (ref 38–126)
ALT: 11 U/L — ABNORMAL LOW (ref 17–63)
ANION GAP: 6 (ref 5–15)
AST: 14 U/L — AB (ref 15–41)
BUN: 21 mg/dL — AB (ref 6–20)
CALCIUM: 9.8 mg/dL (ref 8.9–10.3)
CO2: 29 mmol/L (ref 22–32)
Chloride: 104 mmol/L (ref 101–111)
Creatinine, Ser: 1.11 mg/dL (ref 0.61–1.24)
GFR calc Af Amer: >60 mL/min (ref >60)
GFR calc non Af Amer: 58 mL/min — ABNORMAL LOW (ref >60)
GLUCOSE: 134 mg/dL — AB (ref 65–99)
POTASSIUM: 4.5 mmol/L (ref 3.5–5.1)
SODIUM: 139 mmol/L (ref 135–145)
Total Bilirubin: 0.6 mg/dL (ref 0.3–1.2)
Total Protein: 6.6 g/dL (ref 6.5–8.1)

## 2016-10-25 LAB — CBC WITH DIFFERENTIAL/PLATELET
BASOS ABS: 0 10*3/uL (ref 0–0.1)
BASOS PCT: 1 %
EOS ABS: 0.2 10*3/uL (ref 0–0.7)
Eosinophils Relative: 3 %
HEMATOCRIT: 32.5 % — AB (ref 40.0–52.0)
HEMOGLOBIN: 10.9 g/dL — AB (ref 13.0–18.0)
Lymphocytes Relative: 17 %
Lymphs Abs: 1.2 10*3/uL (ref 1.0–3.6)
MCH: 31 pg (ref 26.0–34.0)
MCHC: 33.6 g/dL (ref 32.0–36.0)
MCV: 92.4 fL (ref 80.0–100.0)
MONOS PCT: 8 %
Monocytes Absolute: 0.6 10*3/uL (ref 0.2–1.0)
NEUTROS ABS: 5.1 10*3/uL (ref 1.4–6.5)
NEUTROS PCT: 71 %
Platelets: 142 10*3/uL — ABNORMAL LOW (ref 150–440)
RBC: 3.52 MIL/uL — ABNORMAL LOW (ref 4.40–5.90)
RDW: 14.3 % (ref 11.5–14.5)
WBC: 7.1 10*3/uL (ref 3.8–10.6)

## 2016-10-25 LAB — PROTIME-INR
INR: 0.97
Prothrombin Time: 12.9 seconds (ref 11.4–15.2)

## 2016-10-25 LAB — APTT: APTT: 31 s (ref 24–36)

## 2016-10-25 NOTE — Pre-Procedure Instructions (Signed)
LM AND FAXED REQUEST FOR MOST RECENT CLEARANCE NOTE/STRESS/ECHO

## 2016-10-25 NOTE — Patient Instructions (Signed)
  Your procedure is scheduled on: 11/04/16 Report to Day Surgery.[facility] SECOND FLOOR To find out your arrival time please call [phone] between 1PM - 3PM on 11/03/16  Remember: Instructions that are not followed completely may result in serious medical risk, up to and including death, or upon the discretion of your surgeon and anesthesiologist your surgery may need to be rescheduled.    __X__ 1. Do not eat food or drink liquids after midnight. No gum chewing or hard candies.     ____ 2. No Alcohol for 24 hours before or after surgery.   ____ 3. Do Not Smoke For 24 Hours Prior to Your Surgery.   ____ 4. Bring all medications with you on the day of surgery if instructed.    __X__ 5. Notify your doctor if there is any change in your medical condition     (cold, fever, infections).       Do not wear jewelry, make-up, hairpins, clips or nail polish.  Do not wear lotions, powders, or perfumes. You may wear deodorant.  Do not shave 48 hours prior to surgery. Men may shave face and neck.  Do not bring valuables to the hospital.    [facility] is not responsible for any belongings or valuables.               Contacts, dentures or bridgework may not be worn into surgery.  Leave your suitcase in the car. After surgery it may be brought to your room.  For patients admitted to the hospital, discharge time is determined by your                treatment team.   Patients discharged the day of surgery will not be allowed to drive home.     _X___ Take these medicines the morning of surgery with A SIP OF WATER:    1. ISOSORBIDE  2. RANEXA  3. PANTOPRAZOLE AT BEDTIME 11/03/16 AND AM OF SURGERY  4.METOPROLOL  5.PROZAC  6.  ____ Fleet Enema (as directed)   _X___ Use CHG Soap as directed  _X___ Use inhalers on the day of surgery  __X__ Stop metformin 2 days prior to surgery STOP LAST DOSE 11/01/16  _X___ Take 1/2 of usual insulin dose the night before surgery and none on the morning  of surgery.   __X__ Stop Coumadin/Plavix/aspirin on     STOPPING ASPIRIN AND PLAVIX 5 DAYS BEFORE SURGEWRY  ____ Stop Anti-inflammatories on    ____ Stop supplements until after surgery.    ____ Bring C-Pap to the hospital.

## 2016-10-26 NOTE — Pre-Procedure Instructions (Signed)
CLEARED BY DR Chauncey Cruel [facility] 10/04/16 LOW RISK

## 2016-11-04 ENCOUNTER — Observation Stay
Admission: RE | Admit: 2016-11-04 | Discharge: 2016-11-05 | Disposition: A | Discharge status: Home / Self Care | Payer: Medicare Other | Source: Ambulatory Visit | Attending: Surgery | Admitting: Surgery

## 2016-11-04 ENCOUNTER — Encounter
Admission: RE | Disposition: A | Discharge status: Home / Self Care | Payer: Self-pay | Source: Ambulatory Visit | Attending: Surgery

## 2016-11-04 ENCOUNTER — Ambulatory Visit: Payer: Medicare Other | Admitting: Certified Registered"

## 2016-11-04 ENCOUNTER — Encounter: Payer: Self-pay | Admitting: *Deleted

## 2016-11-04 DIAGNOSIS — Z823 Family history of stroke: Secondary | ICD-10-CM | POA: Diagnosis not present

## 2016-11-04 DIAGNOSIS — F329 Major depressive disorder, single episode, unspecified: Secondary | ICD-10-CM | POA: Diagnosis not present

## 2016-11-04 DIAGNOSIS — Z85828 Personal history of other malignant neoplasm of skin: Secondary | ICD-10-CM | POA: Insufficient documentation

## 2016-11-04 DIAGNOSIS — E1142 Type 2 diabetes mellitus with diabetic polyneuropathy: Secondary | ICD-10-CM | POA: Diagnosis not present

## 2016-11-04 DIAGNOSIS — E785 Hyperlipidemia, unspecified: Secondary | ICD-10-CM | POA: Diagnosis not present

## 2016-11-04 DIAGNOSIS — I252 Old myocardial infarction: Secondary | ICD-10-CM | POA: Insufficient documentation

## 2016-11-04 DIAGNOSIS — J449 Chronic obstructive pulmonary disease, unspecified: Secondary | ICD-10-CM | POA: Diagnosis not present

## 2016-11-04 DIAGNOSIS — I25119 Atherosclerotic heart disease of native coronary artery with unspecified angina pectoris: Secondary | ICD-10-CM | POA: Insufficient documentation

## 2016-11-04 DIAGNOSIS — K811 Chronic cholecystitis: Secondary | ICD-10-CM | POA: Diagnosis not present

## 2016-11-04 DIAGNOSIS — Z87891 Personal history of nicotine dependence: Secondary | ICD-10-CM | POA: Diagnosis not present

## 2016-11-04 DIAGNOSIS — H9190 Unspecified hearing loss, unspecified ear: Secondary | ICD-10-CM | POA: Insufficient documentation

## 2016-11-04 DIAGNOSIS — I509 Heart failure, unspecified: Secondary | ICD-10-CM | POA: Insufficient documentation

## 2016-11-04 DIAGNOSIS — Z794 Long term (current) use of insulin: Secondary | ICD-10-CM | POA: Diagnosis not present

## 2016-11-04 DIAGNOSIS — I110 Hypertensive heart disease with heart failure: Secondary | ICD-10-CM | POA: Diagnosis not present

## 2016-11-04 DIAGNOSIS — K8012 Calculus of gallbladder with acute and chronic cholecystitis without obstruction: Secondary | ICD-10-CM | POA: Diagnosis not present

## 2016-11-04 DIAGNOSIS — K219 Gastro-esophageal reflux disease without esophagitis: Secondary | ICD-10-CM | POA: Insufficient documentation

## 2016-11-04 DIAGNOSIS — Z7902 Long term (current) use of antithrombotics/antiplatelets: Secondary | ICD-10-CM | POA: Diagnosis not present

## 2016-11-04 DIAGNOSIS — K439 Ventral hernia without obstruction or gangrene: Secondary | ICD-10-CM

## 2016-11-04 DIAGNOSIS — Z7982 Long term (current) use of aspirin: Secondary | ICD-10-CM | POA: Insufficient documentation

## 2016-11-04 DIAGNOSIS — D649 Anemia, unspecified: Secondary | ICD-10-CM | POA: Diagnosis not present

## 2016-11-04 DIAGNOSIS — K8010 Calculus of gallbladder with chronic cholecystitis without obstruction: Secondary | ICD-10-CM | POA: Diagnosis present

## 2016-11-04 DIAGNOSIS — Z9981 Dependence on supplemental oxygen: Secondary | ICD-10-CM | POA: Diagnosis not present

## 2016-11-04 DIAGNOSIS — K819 Cholecystitis, unspecified: Secondary | ICD-10-CM | POA: Diagnosis present

## 2016-11-04 HISTORY — PX: CHOLECYSTECTOMY: SHX55

## 2016-11-04 LAB — CBC
HEMATOCRIT: 29.2 % — AB (ref 40.0–52.0)
HEMOGLOBIN: 10 g/dL — AB (ref 13.0–18.0)
MCH: 31.1 pg (ref 26.0–34.0)
MCHC: 34.3 g/dL (ref 32.0–36.0)
MCV: 90.6 fL (ref 80.0–100.0)
Platelets: 184 10*3/uL (ref 150–440)
RBC: 3.22 MIL/uL — ABNORMAL LOW (ref 4.40–5.90)
RDW: 14 % (ref 11.5–14.5)
WBC: 6.9 10*3/uL (ref 3.8–10.6)

## 2016-11-04 LAB — CREATININE, SERUM
Creatinine, Ser: 1.12 mg/dL (ref 0.61–1.24)
GFR calc Af Amer: >60 mL/min (ref >60)
GFR, EST NON AFRICAN AMERICAN: 57 mL/min — AB (ref >60)

## 2016-11-04 LAB — GLUCOSE, CAPILLARY
GLUCOSE-CAPILLARY: 104 mg/dL — AB (ref 65–99)
GLUCOSE-CAPILLARY: 126 mg/dL — AB (ref 65–99)
GLUCOSE-CAPILLARY: 129 mg/dL — AB (ref 65–99)
GLUCOSE-CAPILLARY: 162 mg/dL — AB (ref 65–99)
Glucose-Capillary: 137 mg/dL — ABNORMAL HIGH (ref 65–99)

## 2016-11-04 SURGERY — LAPAROSCOPIC CHOLECYSTECTOMY
Anesthesia: Choice

## 2016-11-04 MED ORDER — ROCURONIUM BROMIDE 100 MG/10ML IV SOLN
INTRAVENOUS | Status: DC | PRN
Start: 2016-11-04 — End: 2016-11-04
  Administered 2016-11-04: 5 mg via INTRAVENOUS
  Administered 2016-11-04: 40 mg via INTRAVENOUS
  Administered 2016-11-04: 20 mg via INTRAVENOUS

## 2016-11-04 MED ORDER — FENTANYL CITRATE (PF) 100 MCG/2ML IJ SOLN
25.0000 ug | INTRAMUSCULAR | Status: DC | PRN
  Administered 2016-11-04 (×2): 25 ug via INTRAVENOUS

## 2016-11-04 MED ORDER — ONDANSETRON 4 MG PO TBDP: 4.0000 mg | ORAL_TABLET | Freq: Four times a day (QID) | ORAL | Status: DC | PRN

## 2016-11-04 MED ORDER — FENTANYL CITRATE (PF) 100 MCG/2ML IJ SOLN
INTRAMUSCULAR | Status: AC
  Administered 2016-11-04: 25 ug via INTRAVENOUS
  Filled 2016-11-04: qty 2

## 2016-11-04 MED ORDER — GLYCOPYRROLATE 0.2 MG/ML IJ SOLN
INTRAMUSCULAR | Status: DC | PRN
  Administered 2016-11-04: 1 mg via INTRAVENOUS

## 2016-11-04 MED ORDER — TAMSULOSIN HCL 0.4 MG PO CAPS
0.4000 mg | ORAL_CAPSULE | Freq: Every day | ORAL | Status: DC
  Administered 2016-11-04 – 2016-11-05 (×2): 0.4 mg via ORAL
  Filled 2016-11-04 (×2): qty 1

## 2016-11-04 MED ORDER — BUPIVACAINE-EPINEPHRINE (PF) 0.25% -1:200000 IJ SOLN
INTRAMUSCULAR | Status: AC
  Filled 2016-11-04: qty 30

## 2016-11-04 MED ORDER — ALBUTEROL SULFATE HFA 108 (90 BASE) MCG/ACT IN AERS: 1.0000 | INHALATION_SPRAY | Freq: Four times a day (QID) | RESPIRATORY_TRACT | Status: DC | PRN

## 2016-11-04 MED ORDER — SODIUM CHLORIDE 0.9 % IJ SOLN
INTRAMUSCULAR | Status: AC
  Filled 2016-11-04: qty 10

## 2016-11-04 MED ORDER — OXYCODONE HCL 5 MG PO TABS
5.0000 mg | ORAL_TABLET | ORAL | Status: DC | PRN
  Administered 2016-11-05: 5 mg via ORAL
  Filled 2016-11-04: qty 1

## 2016-11-04 MED ORDER — ASPIRIN EC 81 MG PO TBEC
81.0000 mg | DELAYED_RELEASE_TABLET | Freq: Every day | ORAL | Status: DC
  Administered 2016-11-04 – 2016-11-05 (×2): 81 mg via ORAL
  Filled 2016-11-04 (×3): qty 1

## 2016-11-04 MED ORDER — ACETAMINOPHEN 10 MG/ML IV SOLN
INTRAVENOUS | Status: DC | PRN
  Administered 2016-11-04: 1000 mg via INTRAVENOUS

## 2016-11-04 MED ORDER — ACETAMINOPHEN 500 MG PO TABS
1000.0000 mg | ORAL_TABLET | Freq: Four times a day (QID) | ORAL | Status: DC
  Administered 2016-11-04 – 2016-11-05 (×3): 1000 mg via ORAL
  Filled 2016-11-04 (×4): qty 2

## 2016-11-04 MED ORDER — CEFAZOLIN SODIUM-DEXTROSE 2-4 GM/100ML-% IV SOLN
2.0000 g | INTRAVENOUS | Status: AC
  Administered 2016-11-04: 2 g via INTRAVENOUS

## 2016-11-04 MED ORDER — DEXTROSE IN LACTATED RINGERS 5 % IV SOLN: INTRAVENOUS | Status: DC

## 2016-11-04 MED ORDER — ETOMIDATE 2 MG/ML IV SOLN
INTRAVENOUS | Status: DC | PRN
  Administered 2016-11-04: 20 mg via INTRAVENOUS

## 2016-11-04 MED ORDER — EPHEDRINE SULFATE 50 MG/ML IJ SOLN
INTRAMUSCULAR | Status: DC | PRN
  Administered 2016-11-04 (×7): 5 mg via INTRAVENOUS

## 2016-11-04 MED ORDER — FENTANYL CITRATE (PF) 100 MCG/2ML IJ SOLN
INTRAMUSCULAR | Status: DC | PRN
  Administered 2016-11-04: 100 ug via INTRAVENOUS

## 2016-11-04 MED ORDER — BUPIVACAINE-EPINEPHRINE 0.25% -1:200000 IJ SOLN
INTRAMUSCULAR | Status: DC | PRN
  Administered 2016-11-04: 30 mL

## 2016-11-04 MED ORDER — SUCCINYLCHOLINE CHLORIDE 20 MG/ML IJ SOLN
INTRAMUSCULAR | Status: DC | PRN
  Administered 2016-11-04: 100 mg via INTRAVENOUS

## 2016-11-04 MED ORDER — RANOLAZINE ER 500 MG PO TB12
500.0000 mg | ORAL_TABLET | Freq: Two times a day (BID) | ORAL | Status: DC
  Administered 2016-11-04 – 2016-11-05 (×2): 500 mg via ORAL
  Filled 2016-11-04 (×2): qty 1

## 2016-11-04 MED ORDER — ALBUTEROL SULFATE (2.5 MG/3ML) 0.083% IN NEBU: 2.5000 mg | INHALATION_SOLUTION | Freq: Four times a day (QID) | RESPIRATORY_TRACT | Status: DC | PRN

## 2016-11-04 MED ORDER — INSULIN GLARGINE 100 UNIT/ML ~~LOC~~ SOLN
12.0000 [IU] | Freq: Every day | SUBCUTANEOUS | Status: DC
  Administered 2016-11-04: 12 [IU] via SUBCUTANEOUS
  Filled 2016-11-04 (×2): qty 0.12

## 2016-11-04 MED ORDER — ACETAMINOPHEN 10 MG/ML IV SOLN
INTRAVENOUS | Status: AC
  Filled 2016-11-04: qty 100

## 2016-11-04 MED ORDER — INSULIN ASPART 100 UNIT/ML ~~LOC~~ SOLN: 0.0000 [IU] | Freq: Every day | SUBCUTANEOUS | Status: DC

## 2016-11-04 MED ORDER — ONDANSETRON HCL 4 MG/2ML IJ SOLN: 4.0000 mg | Freq: Once | INTRAMUSCULAR | Status: DC | PRN

## 2016-11-04 MED ORDER — LACTATED RINGERS IV SOLN
INTRAVENOUS | Status: DC
  Administered 2016-11-04 (×2): via INTRAVENOUS

## 2016-11-04 MED ORDER — PANTOPRAZOLE SODIUM 40 MG IV SOLR
40.0000 mg | Freq: Every day | INTRAVENOUS | Status: DC
Start: 2016-11-04 — End: 2016-11-05
  Administered 2016-11-04: 40 mg via INTRAVENOUS
  Filled 2016-11-04: qty 40

## 2016-11-04 MED ORDER — TIOTROPIUM BROMIDE MONOHYDRATE 18 MCG IN CAPS
18.0000 ug | ORAL_CAPSULE | Freq: Two times a day (BID) | RESPIRATORY_TRACT | Status: DC
  Filled 2016-11-04: qty 5

## 2016-11-04 MED ORDER — METOPROLOL SUCCINATE ER 25 MG PO TB24
25.0000 mg | ORAL_TABLET | Freq: Every day | ORAL | Status: DC
  Administered 2016-11-05: 25 mg via ORAL
  Filled 2016-11-04: qty 1

## 2016-11-04 MED ORDER — ONDANSETRON HCL 4 MG/2ML IJ SOLN
INTRAMUSCULAR | Status: DC | PRN
  Administered 2016-11-04: 4 mg via INTRAVENOUS

## 2016-11-04 MED ORDER — CHLORHEXIDINE GLUCONATE CLOTH 2 % EX PADS: 6.0000 | MEDICATED_PAD | Freq: Once | CUTANEOUS | Status: DC

## 2016-11-04 MED ORDER — CEFAZOLIN SODIUM-DEXTROSE 2-4 GM/100ML-% IV SOLN
INTRAVENOUS | Status: AC
  Administered 2016-11-04: 2 g via INTRAVENOUS
  Filled 2016-11-04: qty 100

## 2016-11-04 MED ORDER — MORPHINE SULFATE (PF) 4 MG/ML IV SOLN
2.0000 mg | INTRAVENOUS | Status: DC | PRN
  Administered 2016-11-04 (×2): 2 mg via INTRAVENOUS
  Filled 2016-11-04 (×3): qty 1

## 2016-11-04 MED ORDER — HEPARIN SODIUM (PORCINE) 5000 UNIT/ML IJ SOLN
5000.0000 [IU] | Freq: Three times a day (TID) | INTRAMUSCULAR | Status: DC
  Administered 2016-11-04 – 2016-11-05 (×2): 5000 [IU] via SUBCUTANEOUS
  Filled 2016-11-04 (×2): qty 1

## 2016-11-04 MED ORDER — NEOSTIGMINE METHYLSULFATE 10 MG/10ML IV SOLN
INTRAVENOUS | Status: DC | PRN
  Administered 2016-11-04: 5 mg via INTRAVENOUS

## 2016-11-04 MED ORDER — LIDOCAINE HCL (CARDIAC) 20 MG/ML IV SOLN
INTRAVENOUS | Status: DC | PRN
  Administered 2016-11-04: 100 mg via INTRAVENOUS

## 2016-11-04 MED ORDER — ONDANSETRON HCL 4 MG/2ML IJ SOLN
4.0000 mg | Freq: Four times a day (QID) | INTRAMUSCULAR | Status: DC | PRN
  Administered 2016-11-04: 4 mg via INTRAVENOUS
  Filled 2016-11-04: qty 2

## 2016-11-04 MED ORDER — SODIUM CHLORIDE 0.9 % IV SOLN
INTRAVENOUS | Status: DC
  Administered 2016-11-04: 07:00:00 via INTRAVENOUS

## 2016-11-04 MED ORDER — INSULIN ASPART 100 UNIT/ML ~~LOC~~ SOLN: 0.0000 [IU] | Freq: Three times a day (TID) | SUBCUTANEOUS | Status: DC

## 2016-11-04 MED ORDER — ISOSORBIDE MONONITRATE ER 30 MG PO TB24
30.0000 mg | ORAL_TABLET | Freq: Every day | ORAL | Status: DC
  Administered 2016-11-05: 30 mg via ORAL
  Filled 2016-11-04: qty 1

## 2016-11-04 SURGICAL SUPPLY — 51 items
APPLICATOR COTTON TIP 6IN STRL (MISCELLANEOUS) IMPLANT
APPLIER CLIP 5 13 M/L LIGAMAX5 (MISCELLANEOUS) ×3
BLADE SURG 15 STRL LF DISP TIS (BLADE) ×1 IMPLANT
BLADE SURG 15 STRL SS (BLADE) ×2
BULB RESERV EVAC DRAIN JP 100C (MISCELLANEOUS) ×3 IMPLANT
CANISTER SUCT 1200ML W/VALVE (MISCELLANEOUS) ×3 IMPLANT
CHLORAPREP W/TINT 26ML (MISCELLANEOUS) ×3 IMPLANT
CHOLANGIOGRAM CATH TAUT (CATHETERS) IMPLANT
CLEANER CAUTERY TIP 5X5 PAD (MISCELLANEOUS) ×1 IMPLANT
CLIP APPLIE 5 13 M/L LIGAMAX5 (MISCELLANEOUS) ×1 IMPLANT
DECANTER SPIKE VIAL GLASS SM (MISCELLANEOUS) IMPLANT
DEVICE TROCAR PUNCTURE CLOSURE (ENDOMECHANICALS) IMPLANT
DRAIN CHANNEL JP 15F RND 16 (MISCELLANEOUS) ×3 IMPLANT
DRAPE C-ARM XRAY 36X54 (DRAPES) ×3 IMPLANT
ELECT CAUTERY BLADE 6.4 (BLADE) ×3 IMPLANT
ELECT REM PT RETURN 9FT ADLT (ELECTROSURGICAL) ×3
ELECTRODE REM PT RTRN 9FT ADLT (ELECTROSURGICAL) ×1 IMPLANT
ENDOPOUCH RETRIEVER 10 (MISCELLANEOUS) ×3 IMPLANT
GLOVE BIO SURGEON STRL SZ7 (GLOVE) ×15 IMPLANT
GOWN STRL REUS W/ TWL LRG LVL3 (GOWN DISPOSABLE) ×3 IMPLANT
GOWN STRL REUS W/TWL LRG LVL3 (GOWN DISPOSABLE) ×6
IRRIGATION STRYKERFLOW (MISCELLANEOUS) IMPLANT
IRRIGATOR STRYKERFLOW (MISCELLANEOUS)
IV CATH ANGIO 12GX3 LT BLUE (NEEDLE) IMPLANT
IV NS 1000ML (IV SOLUTION) ×2
IV NS 1000ML BAXH (IV SOLUTION) ×1 IMPLANT
L-HOOK LAP DISP 36CM (ELECTROSURGICAL) ×3
LHOOK LAP DISP 36CM (ELECTROSURGICAL) ×1 IMPLANT
LIQUID BAND (GAUZE/BANDAGES/DRESSINGS) ×3 IMPLANT
NEEDLE HYPO 22GX1.5 SAFETY (NEEDLE) ×3 IMPLANT
NS IRRIG 500ML POUR BTL (IV SOLUTION) ×3 IMPLANT
PACK LAP CHOLECYSTECTOMY (MISCELLANEOUS) ×3 IMPLANT
PAD CLEANER CAUTERY TIP 5X5 (MISCELLANEOUS) ×2
PENCIL ELECTRO HAND CTR (MISCELLANEOUS) ×3 IMPLANT
SCISSORS METZENBAUM CVD 33 (INSTRUMENTS) ×3 IMPLANT
SET SUCTION IRRIG HYDROSURG (IRRIGATION / IRRIGATOR) ×3 IMPLANT
SLEEVE ENDOPATH XCEL 5M (ENDOMECHANICALS) ×6 IMPLANT
SOL ANTI-FOG 6CC FOG-OUT (MISCELLANEOUS) IMPLANT
SOL FOG-OUT ANTI-FOG 6CC (MISCELLANEOUS)
SPONGE LAP 18X18 5 PK (GAUZE/BANDAGES/DRESSINGS) ×3 IMPLANT
STOPCOCK 3 WAY  REPLAC (MISCELLANEOUS) IMPLANT
SUT ETHIBOND 0 MO6 C/R (SUTURE) ×3 IMPLANT
SUT ETHILON 3 0 PS 1 (SUTURE) ×3 IMPLANT
SUT MNCRL AB 4-0 PS2 18 (SUTURE) ×3 IMPLANT
SUT VIC AB 0 CT2 27 (SUTURE) IMPLANT
SUT VICRYL 0 AB UR-6 (SUTURE) ×6 IMPLANT
SYR 20CC LL (SYRINGE) ×3 IMPLANT
TROCAR XCEL BLUNT TIP 100MML (ENDOMECHANICALS) ×3 IMPLANT
TROCAR XCEL NON-BLD 5MMX100MML (ENDOMECHANICALS) ×3 IMPLANT
TUBING INSUFFLATOR HI FLOW (MISCELLANEOUS) ×3 IMPLANT
WATER STERILE IRR 1000ML POUR (IV SOLUTION) IMPLANT

## 2016-11-04 NOTE — Anesthesia Preprocedure Evaluation (Signed)
Anesthesia Evaluation  Patient identified by MRN, date of birth, ID band Patient awake    Reviewed: Allergy & Precautions, H&P , NPO status , Patient's Chart, lab work & pertinent test results, reviewed documented beta blocker date and time   Airway Mallampati: III  TM Distance: >3 FB Neck ROM: full    Dental  (+) Edentulous Upper, Edentulous Lower   Pulmonary neg pulmonary ROS, COPD, former smoker,    Pulmonary exam normal        Cardiovascular hypertension, + angina with exertion + CAD, + Past MI, + Cardiac Stents, + CABG and +CHF  negative cardio ROS Normal cardiovascular exam Rhythm:regular Rate:Normal     Neuro/Psych PSYCHIATRIC DISORDERS  Neuromuscular disease negative neurological ROS  negative psych ROS   GI/Hepatic negative GI ROS, Neg liver ROS, GERD  Medicated,  Endo/Other  negative endocrine ROSdiabetes  Renal/GU negative Renal ROS  negative genitourinary   Musculoskeletal   Abdominal   Peds  Hematology negative hematology ROS (+) anemia ,   Anesthesia Other Findings Past Medical History: No date: Anemia No date: Arthritis No date: Back pain No date: CHF (congestive heart failure) ([name]) No date: COPD (chronic obstructive pulmonary disease) (* No date: Coronary artery disease No date: Deafness No date: Depression No date: Diabetes mellitus without complication ([name]) No date: Dizziness     Comment: when gets up No date: GERD (gastroesophageal reflux disease) No date: Hyperlipidemia No date: Hypertension No date: Melena No date: Myocardial infarction     Comment: maybe a small one No date: Obesity No date: Oxygen decrease     Comment: WEARS HS,PRN No date: Peripheral neuropathy ([name]) No date: Peripheral neuropathy ([name])     Comment: bil.hands and feet No date: Skin cancer No date: Umbilical hernia Past Surgical History: 07/17/2015: CARDIAC CATHETERIZATION Left     Comment: Procedure:  Right/Left Heart Cath and Coronary               Angiography;  Surgeon: [name], MD;                Location: [place] [facility];  Service:               Cardiovascular;  Laterality: Left; 07/17/2015: CARDIAC CATHETERIZATION N/A     Comment: Procedure: Coronary Stent Intervention;                Surgeon: [name], MD;  Location: [place] [facility];  Service: Cardiovascular;                Laterality: N/A; 05/10/2016: CARDIAC CATHETERIZATION Right     Comment: Procedure: Left Heart Cath and Coronary               Angiography;  Surgeon: [name], MD;                Location: [place] [facility];  Service:               Cardiovascular;  Laterality: Right; 12/12/2015: COLONOSCOPY WITH PROPOFOL N/A     Comment: Procedure: COLONOSCOPY WITH PROPOFOL;                Surgeon: [name], MD;  Location: [facility]               ENDOSCOPY;  Service: Gastroenterology;  Laterality: N/A; No date: CORONARY ANGIOPLASTY     Comment: stent 8/16 No date: CORONARY ARTERY BYPASS GRAFT 11/10/2015: ESOPHAGOGASTRODUODENOSCOPY N/A     Comment: Procedure: ESOPHAGOGASTRODUODENOSCOPY (EGD);                Surgeon: [name], MD;  Location: [facility]               ENDOSCOPY;  Service: Endoscopy;  Laterality:               N/A; No date: EYE SURGERY No date: gall bladder drain BMI    Body Mass Index:  26.83 kg/m     Reproductive/Obstetrics negative OB ROS                             Anesthesia Physical Anesthesia Plan  ASA: III  Anesthesia Plan: General ETT   Post-op Pain Management:    Induction:   Airway Management Planned:   Additional Equipment:   Intra-op Plan:   Post-operative Plan:   Informed Consent: I have reviewed the patients History and Physical, chart, labs and discussed the procedure including the risks, benefits and alternatives for the proposed anesthesia with the patient or authorized representative who has  indicated his/her understanding and acceptance.   Dental Advisory Given  Plan Discussed with: CRNA  Anesthesia Plan Comments:         Anesthesia Quick Evaluation

## 2016-11-04 NOTE — Transfer of Care (Signed)
Immediate Anesthesia Transfer of Care Note  Patient: Justin Leonard  Procedure(s) Performed: Procedure(s): LAPAROSCOPIC CHOLECYSTECTOMY, POSSIBLE OPEN umbilical hernia repair (N/A)  Patient Location: PACU  Anesthesia Type:General  Level of Consciousness: awake, alert  and responds to stimulation  Airway & Oxygen Therapy: Patient Spontanous Breathing and Patient connected to face mask oxygen  Post-op Assessment: Report given to RN and Post -op Vital signs reviewed and stable  Post vital signs: Reviewed and stable  Last Vitals:  Vitals:   11/04/16 0614 11/04/16 0924  BP: 139/64 (!) 170/76  Pulse: 61 68  Resp: 16 17  Temp: (!) 35.8 C     Last Pain:  Vitals:   11/04/16 0614  TempSrc: Tympanic         Complications: No apparent anesthesia complications

## 2016-11-04 NOTE — H&P (View-Only) (Signed)
Patient ID: Justin Leonard, male   DOB: 1927/04/01, 82 y.o.   MRN: [id number]  HPI Justin Leonard is a 82 y.o. male  well-known patient to our practice with  chronic cholecystitis with recurrent episodes. At some point in time he did have a cholecystostomy tube at that time he was decompensated from a medical perspective and was not a surgical candidate. A few months ago he was evaluated by cardiology initially his troponins went up but cardiology determined that there was no evidence of myocardial infarction but rather troponin leak from the mild ischemia. He is cardiac cath showed no evidence of treatable stenosis and  Medical management was recommended. He also has a history of diabetes, CHF, COPD and wears oxygen only at night. He walks with a walker and today he came walking on his own without any dyspnea. We'll recently he came to the emergency room for chest pain and there was a very subtle elevation of his troponin's. Cardiology evaluated him again and apparently did a stress test in the outpatient setting. Unfortunately do not have any records pertinent pertaining his last stress test nor the last cardiology note. Per the patient's reports he stated that he passed had a stress test. He currently is symptom free with no angina or dyspnea. He does have intermittent abdominal pain and nausea related to his chronic cholecystitis. He is on both Plavix and aspirin. We actually have received pulmonary clearance and we did receive cardiac clearance but unfortunately this was before his most recent anginal episode. No evidence of biliary structure or cholangitis. Findings did have fever and was managed with antibiotics empirically and he responded well more likely we thought this was another attack of cholecystitis. Currently is symptom free   HPI      Past Medical History:  Diagnosis Date  . Back pain   . CHF (congestive heart failure) ([place])   . COPD (chronic obstructive pulmonary disease) ([place])    . Coronary artery disease   . Deafness   . Depression   . Diabetes mellitus without complication ([place])   . Hyperlipidemia   . Hypertension   . Melena   . Obesity   . Peripheral neuropathy ([place])   . Peripheral neuropathy ([name])    bil.hands and feet  . Skin cancer   . Umbilical hernia     Past Surgical History:  Procedure Laterality Date  . CARDIAC CATHETERIZATION Left 07/17/2015   Procedure: Right/Left Heart Cath and Coronary Angiography;  Surgeon: [name], MD;  Location: [facility];  Service: Cardiovascular;  Laterality: Left;  . CARDIAC CATHETERIZATION N/A 07/17/2015   Procedure: Coronary Stent Intervention;  Surgeon: [name], MD;  Location: [place];  Service: Cardiovascular;  Laterality: N/A;  . CARDIAC CATHETERIZATION Right 05/10/2016   Procedure: Left Heart Cath and Coronary Angiography;  Surgeon: [name], MD;  Location: [facility];  Service: Cardiovascular;  Laterality: Right;  . COLONOSCOPY WITH PROPOFOL N/A 12/12/2015   Procedure: COLONOSCOPY WITH PROPOFOL;  Surgeon: [name], MD;  Location: [facility] END[facility];  Service: Gastroenterology;  Laterality: N/A;  . CORONARY ANGIOPLASTY    . CORONARY ARTERY BYPASS GRAFT    . ESOPHAGOGASTRODUODENOSCOPY N/A 11/10/2015   Procedure: ESOPHAGOGASTRODUODENOSCOPY (EGD);  Surgeon: [name], MD;  Location[facility] ENDOSCOPY;  Service: Endoscopy;  Laterality: N/A;  . gall bladder drain      Family History  Problem Relation Age of Onset  . Hypertension Father   .  CVA Father   . Anemia Neg Hx   . Arrhythmia Neg Hx   . Asthma Neg Hx   . Clotting disorder Neg Hx   . Fainting Neg Hx   . Heart attack Neg Hx   . Heart disease Neg Hx   . Heart failure Neg Hx   . Hyperlipidemia Neg Hx     Social History Social History  Substance Use Topics  . Smoking status: Former Smoker    Packs/day: 3.00    Years: 30.00    Types: Cigarettes    Quit date: 04/16/1978  . Smokeless tobacco: Never  Used     Comment: Quit smoking 1979  . Alcohol use No    No Known Allergies  Current Outpatient Prescriptions  Medication Sig Dispense Refill  . acetaminophen (TYLENOL) 650 MG CR tablet Take 650 mg by mouth 2 (two) times daily.     Marland Kitchen aspirin EC 81 MG tablet Take 81 mg by mouth daily.    . clopidogrel (PLAVIX) 75 MG tablet Take 1 tablet (75 mg total) by mouth daily. 30 tablet 0  . FLUoxetine (PROZAC) 20 MG capsule Take 20 mg by mouth daily.    . furosemide (LASIX) 40 MG tablet Take 1 tablet (40 mg total) by mouth daily. 10 tablet 0  . insulin glargine (LANTUS) 100 UNIT/ML injection Inject 12 Units into the skin at bedtime.     . isosorbide mononitrate (IMDUR) 30 MG 24 hr tablet Take 1 tablet (30 mg total) by mouth daily. 30 tablet 0  . lovastatin (MEVACOR) 20 MG tablet Take 20 mg by mouth at bedtime.    . metFORMIN (GLUCOPHAGE) 500 MG tablet Take 500 mg by mouth 2 (two) times daily.     . metoprolol succinate (TOPROL-XL) 25 MG 24 hr tablet Take 25 mg by mouth daily.    . nitroGLYCERIN (NITROSTAT) 0.4 MG SL tablet Place 0.4 mg under the tongue every 5 (five) minutes as needed for chest pain.    . pantoprazole (PROTONIX) 40 MG tablet Take 40 mg by mouth daily.     . Polyethylene Glycol 3350 (MIRALAX PO) Take 17 g by mouth daily.    . ranolazine (RANEXA) 500 MG 12 hr tablet Take 500 mg by mouth 2 (two) times daily.     . tamsulosin (FLOMAX) 0.4 MG CAPS capsule Take 1 capsule (0.4 mg total) by mouth daily after supper. 30 capsule 12  . Tiotropium Bromide Monohydrate (SPIRIVA RESPIMAT) 2.5 MCG/ACT AERS Inhale 2 puffs into the lungs See admin instructions. Inhale 2 puffs 1 to 2 times a day.    . VENTOLIN HFA 108 (90 BASE) MCG/ACT inhaler Inhale 1-2 puffs into the lungs as needed. Reported on 12/16/2015     No current facility-administered medications for this visit.      Review of Systems A 10 point review of systems was asked and was negative except for the information on the HPI  Physical  Exam Blood pressure (!) 138/49, pulse 65, temperature 98.2 F (36.8 C), temperature source Oral, height 6\' 3"  (1.905 m), weight 98.6 kg (217 lb 6.4 oz). CONSTITUTIONAL: Elderly male in no acute distress walks with a cane, without any dyspnea EYES: Pupils are equal, round, and reactive to light, Sclera are non-icteric. EARS, NOSE, MOUTH AND THROAT: The oropharynx is clear. The oral mucosa is pink and moist. Hearing is intact to voice. LYMPH NODES:  Lymph nodes in the neck are normal. RESPIRATORY:  Lungs are clear. There is normal respiratory effort, with  equal breath sounds bilaterally, and without pathologic use of accessory muscles. CARDIOVASCULAR: Heart is regular without murmurs, gallops, or rubs. GI: The abdomen is  soft, nontender, and nondistended. There are no palpable masses. There is no hepatosplenomegaly. There are normal bowel sounds in all quadrants. GU: Rectal deferred.   MUSCULOSKELETAL: Normal muscle strength and tone. No cyanosis or edema.   SKIN: Turgor is good and there are no pathologic skin lesions or ulcers. NEUROLOGIC: Motor and sensation is grossly normal. Cranial nerves are grossly intact. PSYCH:  Oriented to person, place and time. Affect is normal.  Data Reviewed  I have personally reviewed the patient's imaging, laboratory findings and medical records.    Assessment/Plan 82 year old with multiple comorbidity mainly with significant coronary artery disease and chronic cholecystitis with recurrent symptoms. I have followed him this patient for several months now and unfortunately keeps having recurrent attacks for chronic cholecystitis. Unfortunately he also had had some angina episode that prompted hospitalization. This is a very challenging circumstance because of the fact that he is A significant cardiac history and also has a significant gallbladder abnormality that is causing significant pain and suffering. I have a lengthy discussion with the patient and with his  wife and about his current circumstances. He is definitely at higher risk for developing complications given his multiple comorbidities but on the other hand if we do not do anything his quality of life and his pain will be worsening. I have given the option for him off for potential cholecystectomy or on the antibiotic management. He is very frustrated and states that he has had a good life but he does not wish to have recurrent cholecystitis attacks. And I do think that from a medical perspective he has improved significantly over the last 8 months or so and he is probably in the best shape he can be. And to be on the safe side I am going to request again and a preop clearance from cardiology given that he had a most recent anginal attack and I don't have any recent stress test that apparently he had done. I will also asked him about and perioperative management of Plavix because we will definitely need to stop that for about 7 days. After we obtain all the clearance and he states that he is willing to undergo a laparoscopic cholecystectomy regardless of the consequences. I have explained to the patient the potential risk including but not limited to: MIs, strokes, death, common bile duct injury, infection, bleeding and prolonged hospitalization. I have also explained to him the potential for open cholecystectomy given the chronicity and inflammatory changes around his gallbladder. He is actually thought this very thoroughly and is ready to have a cholecystectomy done. Again we will wait final cardiology clearance but we'll tentatively put him on the schedule for December for a laparoscopic cholecystectomy possible open I have also discussed with him that given his comorbidities would be prudent for him to stay overnight to make sure that he is overall condition is improving. I have spent at least 45-50 minutes in this encounter with the majority of time spent in counseling and coordinating his care. I have also  offered him to be transferred to the tertiary center but he is adamant that he was to stay here  Caroleen Hamman, MD [place] Surgeon 10/08/2016, 11:36 AM

## 2016-11-04 NOTE — Interval H&P Note (Signed)
History and Physical Interval Note:  11/04/2016 7:18 AM  Justin Leonard  has presented today for surgery, with the diagnosis of CHRONIC CHOLECYSTITIS  The various methods of treatment have been discussed with the patient and family. After consideration of risks, benefits and other options for treatment, the patient has consented to  Procedure(s): LAPAROSCOPIC CHOLECYSTECTOMY, POSSIBLE OPEN (N/A) as a surgical intervention .  The patient's history has been reviewed, patient examined, no change in status, stable for surgery.  I have reviewed the patient's chart and labs.  Questions were answered to the patient's satisfaction.     [place]

## 2016-11-04 NOTE — Anesthesia Procedure Notes (Signed)
Procedure Name: Intubation Performed by: Jamya Starry Pre-anesthesia Checklist: Patient identified, Patient being monitored, Timeout performed, Emergency Drugs available and Suction available Patient Re-evaluated:Patient Re-evaluated prior to inductionOxygen Delivery Method: Circle system utilized Preoxygenation: Pre-oxygenation with 100% oxygen Intubation Type: IV induction Ventilation: Mask ventilation without difficulty Laryngoscope Size: Mac and 4 Grade View: Grade I Tube type: Oral Tube size: 7.5 mm Number of attempts: 1 Airway Equipment and Method: Stylet Placement Confirmation: ETT inserted through vocal cords under direct vision,  positive ETCO2 and breath sounds checked- equal and bilateral Secured at: 22 cm Tube secured with: Tape Dental Injury: Teeth and Oropharynx as per pre-operative assessment        

## 2016-11-04 NOTE — Op Note (Signed)
Laparoscopic Cholecystectomy Epigastric hernia Repair  Pre-operative Diagnosis: Chronic Cholecystitis, Epigastric Hernia  Post-operative Diagnosis: same  Procedure: Laparoscopic cholecystectomy and epigastric hernia repair  Surgeon: Caroleen Hamman, MD FACS  Anesthesia: Gen. with endotracheal tube   Findings: Chronic Cholecystitis  Epigastric hernia  Estimated Blood Loss: 10cc         Drains: 15 FR RUQ         Specimens: Gallbladder           Complications: none   Procedure Details  The patient was seen again in the Holding Room. The benefits, complications, treatment options, and expected outcomes were discussed with the patient. The risks of bleeding, infection, recurrence of symptoms, failure to resolve symptoms, bile duct damage, bile duct leak, retained common bile duct stone, bowel injury, any of which could require further surgery and/or ERCP, stent, or papillotomy were reviewed with the patient. The likelihood of improving the patient's symptoms with return to their baseline status is good.  The patient and/or family concurred with the proposed plan, giving informed consent.  The patient was taken to Operating Room, identified as [name] and the procedure verified as Laparoscopic Cholecystectomy.  A Time Out was held and the above information confirmed.  Prior to the induction of general anesthesia, antibiotic prophylaxis was administered. VTE prophylaxis was in place. General endotracheal anesthesia was then administered and tolerated well. After the induction, the abdomen was prepped with Chloraprep and draped in the sterile fashion. The patient was positioned in the supine position.  Local anesthetic  was injected into the skin above the umbilicus and an incision made supraumbilically. Cut down technique was used to enter the abdominal cavity, epigastric hernia was encountered and the sac was dissected and excised in the standard fashion. a Hasson trochar was placed  after two vicryl stitches were anchored to the fascia. Pneumoperitoneum was then created with CO2 and tolerated well without any adverse changes in the patient's vital signs.  Three 5-mm ports were placed in the right upper quadrant all under direct vision. All skin incisions  were infiltrated with a local anesthetic agent before making the incision and placing the trocars.   The patient was positioned  in reverse Trendelenburg, tilted slightly to the patient's left.  The gallbladder was identified, the fundus grasped and retracted cephalad.  Extensive adhesions were noted specifically from the gallbladder to the omentum and also from the duodenum to the omentum.Adhesions were lysed with scissors and electrocautery very carefully . The infundibulum was grasped and retracted laterally, exposing the peritoneum overlying the triangle of Calot. This was then divided and exposed in a blunt fashion. An extended critical view of the cystic duct and cystic artery was obtained.  The cystic duct was clearly identified and bluntly dissected.   Artery and duct were double clipped and divided.  The gallbladder was taken from the gallbladder fossa in a retrograde fashion with the electrocautery. The gallbladder was removed and placed in an Endocatch bag. The liver bed was irrigated and inspected. Hemostasis was achieved with the electrocautery. Copious irrigation was utilized and was repeatedly aspirated until clear.  The gallbladder and Endocatch sac were then removed through the epigastric port site. A 15 [place] Blake drain was placed in the right upper quadrant and sutured in place.   Inspection of the right upper quadrant was performed. No bleeding, bile duct injury or leak, or bowel injury was noted. Pneumoperitoneum was released. The epigastric defect was closed with interrupted Ethibond sutures in the standard fashion.  4-0 subcuticular Monocryl was used to close the skin. Dermabond was  applied.  The patient was  then extubated and brought to the recovery room in stable condition. Sponge, lap, and needle counts were correct at closure and at the conclusion of the case.               Caroleen Hamman, MD, FACS

## 2016-11-05 ENCOUNTER — Encounter: Payer: Self-pay | Admitting: Surgery

## 2016-11-05 DIAGNOSIS — K8012 Calculus of gallbladder with acute and chronic cholecystitis without obstruction: Secondary | ICD-10-CM | POA: Diagnosis not present

## 2016-11-05 LAB — SURGICAL PATHOLOGY

## 2016-11-05 LAB — GLUCOSE, CAPILLARY: GLUCOSE-CAPILLARY: 115 mg/dL — AB (ref 65–99)

## 2016-11-05 MED ORDER — TIOTROPIUM BROMIDE MONOHYDRATE 18 MCG IN CAPS: 18.0000 ug | ORAL_CAPSULE | Freq: Every day | RESPIRATORY_TRACT | Status: DC

## 2016-11-05 MED ORDER — HYDROCODONE-ACETAMINOPHEN 5-325 MG PO TABS: 1.0000 | ORAL_TABLET | Freq: Four times a day (QID) | ORAL | 0 refills | Status: DC | PRN

## 2016-11-05 NOTE — Care Management (Signed)
Patient admitted post Laparoscopic cholecystectomy .  Patient baseline independent.  Has access to cane at walker int he home.  Reported chronic O2 at night.  Mobile while in the hospital.  Bedside RN to provide JP education prior to discharge.

## 2016-11-05 NOTE — Progress Notes (Signed)
11/05/2016 11:18 AM  At time of discharge teaching, family members expressed frustration about pt's order for Spiriva.  They stated this was an ongoing problem.  This is my first opportunity to care for this pt so I am unfamiliar.  I offered apologies and offered to get the department director or other staff to hear family member concerns.  Family declined.  Dola Argyle, RN

## 2016-11-05 NOTE — Care Management Obs Status (Signed)
[place] NOTIFICATION   Patient Details  Name: Justin Leonard MRN: [id number] Date of Birth: 1927/07/03   Medicare Observation Status Notification Given:  No (Admitted observation less thank 24 hours)    Beverly Sessions, RN 11/05/2016, 10:20 AM

## 2016-11-05 NOTE — Discharge Summary (Signed)
Patient ID: Justin Leonard MRN: [id number] DOB/AGE: 1927-03-20 82 y.o.  Admit date: 11/04/2016 Discharge date: 11/05/2016   Discharge Diagnoses:  Active Problems:   Chronic cholecystitis   Epigastric hernia   Cholecystitis   Procedures lap chole w epigastric H. repair  Hospital Course: Is an 82 year old male with multiple comorbidities and diagnosis of chronic cholecystitis. He was managed non-operatively but had recurrent episodes of cholecystitis. Once he is overall medical status improved and he was offered an elective cholecystectomy. He underwent an elective laparoscopic cholecystectomy. There were significant adhesions were able to perform a laparoscopic cholecystectomy safely. He was kept overnight due to all his extensive cardiac issues and placed on telemetry. He did not have any immediate postoperative complications and at the time of discharge he was doing well, his vital signs were stable he was afebrile. He was tolerating diet. His physical exam at the time of discharge: Showed a male in no acute distress, elderly. Abdomen: Soft, JP with serous fluid no evidence of bile leak or peritonitis. No infection. Extremities: Well perfused some baseline lower extremity edema. Condition of patient on discharge is stable he will go home with the JP drain and will resume plavix tomorrow  Disposition: 01-Home or Self Care  Discharge Instructions    Call MD for:  difficulty breathing, headache or visual disturbances    Complete by:  As directed    Call MD for:  extreme fatigue    Complete by:  As directed    Call MD for:  hives    Complete by:  As directed    Call MD for:  persistant dizziness or light-headedness    Complete by:  As directed    Call MD for:  persistant nausea and vomiting    Complete by:  As directed    Call MD for:  redness, tenderness, or signs of infection (pain, swelling, redness, odor or green/yellow discharge around incision site)    Complete by:  As directed     Call MD for:  severe uncontrolled pain    Complete by:  As directed    Call MD for:  temperature >100.4    Complete by:  As directed    Change dressing (specify)    Complete by:  As directed    Dressing change: daily around drain.   Diet - low sodium heart healthy    Complete by:  As directed    Discharge instructions    Complete by:  As directed    Shower tomorrow, please teach pt and family about JP care, pt to go home w JP   Increase activity slowly    Complete by:  As directed    Lifting restrictions    Complete by:  As directed    20 lbs x 6wks       Medication List    TAKE these medications   acetaminophen 650 MG CR tablet Commonly known as:  TYLENOL Take 650 mg by mouth 2 (two) times daily as needed for pain.   aspirin EC 81 MG tablet Take 81 mg by mouth daily.   clopidogrel 75 MG tablet Commonly known as:  PLAVIX Take 1 tablet (75 mg total) by mouth daily.   ferrous sulfate 325 (65 FE) MG tablet Take 325 mg by mouth 2 (two) times daily.   FLUoxetine 20 MG capsule Commonly known as:  PROZAC Take 20 mg by mouth daily.   furosemide 40 MG tablet Commonly known as:  LASIX Take 1 tablet (40 mg  total) by mouth daily.   HYDROcodone-acetaminophen 5-325 MG tablet Commonly known as:  NORCO/VICODIN Take 1 tablet by mouth daily as needed for moderate pain. What changed:  Another medication with the same name was added. Make sure you understand how and when to take each.   HYDROcodone-acetaminophen 5-325 MG tablet Commonly known as:  NORCO/VICODIN Take 1-2 tablets by mouth every 6 (six) hours as needed for moderate pain. What changed:  You were already taking a medication with the same name, and this prescription was added. Make sure you understand how and when to take each.   insulin glargine 100 UNIT/ML injection Commonly known as:  LANTUS Inject 12 Units into the skin at bedtime.   isosorbide mononitrate 30 MG 24 hr tablet Commonly known as:  IMDUR Take 1  tablet (30 mg total) by mouth daily.   lovastatin 20 MG tablet Commonly known as:  MEVACOR Take 20 mg by mouth at bedtime.   metFORMIN 500 MG tablet Commonly known as:  GLUCOPHAGE Take 500 mg by mouth 2 (two) times daily.   metoprolol succinate 25 MG 24 hr tablet Commonly known as:  TOPROL-XL Take 25 mg by mouth daily.   MIRALAX PO Take 17 g by mouth daily.   nitroGLYCERIN 0.4 MG SL tablet Commonly known as:  NITROSTAT Place 0.4 mg under the tongue every 5 (five) minutes as needed for chest pain.   pantoprazole 40 MG tablet Commonly known as:  PROTONIX Take 40 mg by mouth daily.   ranolazine 500 MG 12 hr tablet Commonly known as:  RANEXA Take 500 mg by mouth 2 (two) times daily.   SPIRIVA RESPIMAT 2.5 MCG/ACT Aers Generic drug:  Tiotropium Bromide Monohydrate Inhale 2 puffs into the lungs See admin instructions. Inhale 2 puffs 1 to 2 times a day.   tamsulosin 0.4 MG Caps capsule Commonly known as:  FLOMAX Take 1 capsule (0.4 mg total) by mouth daily after supper.   VENTOLIN HFA 108 (90 Base) MCG/ACT inhaler Generic drug:  albuterol Inhale 1-2 puffs into the lungs as needed for wheezing or shortness of breath. Reported on 12/16/2015      Follow-up [place], MD Follow up in 1 week(s).   Specialty:  General Surgery Contact information: [place] [address] [phone]            Caroleen Hamman, MD FACS

## 2016-11-05 NOTE — Progress Notes (Signed)
11/05/2016  11:17 AM  Justin Leonard to be D/C'd Home per MD order.  Discussed prescriptions and follow up appointments with the patient. Prescriptions given to patient, medication list explained in detail. Pt verbalized understanding.    Medication List    TAKE these medications   acetaminophen 650 MG CR tablet Commonly known as:  TYLENOL Take 650 mg by mouth 2 (two) times daily as needed for pain.   aspirin EC 81 MG tablet Take 81 mg by mouth daily.   clopidogrel 75 MG tablet Commonly known as:  PLAVIX Take 1 tablet (75 mg total) by mouth daily.   ferrous sulfate 325 (65 FE) MG tablet Take 325 mg by mouth 2 (two) times daily.   FLUoxetine 20 MG capsule Commonly known as:  PROZAC Take 20 mg by mouth daily.   furosemide 40 MG tablet Commonly known as:  LASIX Take 1 tablet (40 mg total) by mouth daily.   HYDROcodone-acetaminophen 5-325 MG tablet Commonly known as:  NORCO/VICODIN Take 1 tablet by mouth daily as needed for moderate pain. What changed:  Another medication with the same name was added. Make sure you understand how and when to take each.   HYDROcodone-acetaminophen 5-325 MG tablet Commonly known as:  NORCO/VICODIN Take 1-2 tablets by mouth every 6 (six) hours as needed for moderate pain. What changed:  You were already taking a medication with the same name, and this prescription was added. Make sure you understand how and when to take each.   insulin glargine 100 UNIT/ML injection Commonly known as:  LANTUS Inject 12 Units into the skin at bedtime.   isosorbide mononitrate 30 MG 24 hr tablet Commonly known as:  IMDUR Take 1 tablet (30 mg total) by mouth daily.   lovastatin 20 MG tablet Commonly known as:  MEVACOR Take 20 mg by mouth at bedtime.   metFORMIN 500 MG tablet Commonly known as:  GLUCOPHAGE Take 500 mg by mouth 2 (two) times daily.   metoprolol succinate 25 MG 24 hr tablet Commonly known as:  TOPROL-XL Take 25 mg by mouth daily.    MIRALAX PO Take 17 g by mouth daily.   nitroGLYCERIN 0.4 MG SL tablet Commonly known as:  NITROSTAT Place 0.4 mg under the tongue every 5 (five) minutes as needed for chest pain.   pantoprazole 40 MG tablet Commonly known as:  PROTONIX Take 40 mg by mouth daily.   ranolazine 500 MG 12 hr tablet Commonly known as:  RANEXA Take 500 mg by mouth 2 (two) times daily.   SPIRIVA RESPIMAT 2.5 MCG/ACT Aers Generic drug:  Tiotropium Bromide Monohydrate Inhale 2 puffs into the lungs See admin instructions. Inhale 2 puffs 1 to 2 times a day.   tamsulosin 0.4 MG Caps capsule Commonly known as:  FLOMAX Take 1 capsule (0.4 mg total) by mouth daily after supper.   VENTOLIN HFA 108 (90 Base) MCG/ACT inhaler Generic drug:  albuterol Inhale 1-2 puffs into the lungs as needed for wheezing or shortness of breath. Reported on 12/16/2015       Vitals:   11/05/16 0421 11/05/16 0756  BP: (!) 114/53 (!) 116/47  Pulse: 69 68  Resp: 18   Temp: 98.1 F (36.7 C) 98.3 F (36.8 C)    Skin clean, dry and intact without evidence of skin break down, no evidence of skin tears noted. IV catheter discontinued intact. Site without signs and symptoms of complications. Dressing and pressure applied. Pt denies pain at this time. No complaints noted.  An After Visit Summary was printed and given to the patient. Patient escorted via [place], and D/C home via private auto.  Dola Argyle

## 2016-11-06 NOTE — Anesthesia Postprocedure Evaluation (Signed)
Anesthesia Post Note  Patient: Justin Leonard  Procedure(s) Performed: Procedure(s) (LRB): LAPAROSCOPIC CHOLECYSTECTOMY, POSSIBLE OPEN umbilical hernia repair (N/A)  Patient location during evaluation: PACU Anesthesia Type: General Level of consciousness: awake and alert Pain management: pain level controlled Vital Signs Assessment: post-procedure vital signs reviewed and stable Respiratory status: spontaneous breathing, nonlabored ventilation, respiratory function stable and patient connected to nasal cannula oxygen Cardiovascular status: blood pressure returned to baseline and stable Postop Assessment: no signs of nausea or vomiting Anesthetic complications: no    Last Vitals:  Vitals:   11/05/16 0421 11/05/16 0756  BP: (!) 114/53 (!) 116/47  Pulse: 69 68  Resp: 18   Temp: 36.7 C 36.8 C    Last Pain:  Vitals:   11/05/16 0851  TempSrc:   PainSc: 2                  Molli Barrows

## 2016-11-08 ENCOUNTER — Other Ambulatory Visit: Payer: Self-pay

## 2016-11-12 ENCOUNTER — Ambulatory Visit (INDEPENDENT_AMBULATORY_CARE_PROVIDER_SITE_OTHER): Payer: Medicare Other | Admitting: Surgery

## 2016-11-12 ENCOUNTER — Encounter: Payer: Self-pay | Admitting: Surgery

## 2016-11-12 VITALS — BP 138/52 | HR 88 | Temp 98.6°F | Ht 74.0 in | Wt 204.0 lb

## 2016-11-12 DIAGNOSIS — Z09 Encounter for follow-up examination after completed treatment for conditions other than malignant neoplasm: Secondary | ICD-10-CM

## 2016-11-12 NOTE — Progress Notes (Signed)
S/p lap chole 12/07 Chronic Cholecystitis on path Doing very well Taking PO, no fevers  140cc day drain output serous  PE NAD Abd: incisions c/d/i, no infection serous drainage from JP No peritonitis  A/P Doing very well considering his age and comorbidities RTC next week if output improves will DC JP No surgical complications

## 2016-11-12 NOTE — Patient Instructions (Signed)
Please keep an accurate measure of the drainage with the cup that I gave you today. Please measure to the nearest cc mark.  Keep the "bulb" collapsed at all times so that suction will stay on the area of fluid. You will want to keep a dressing over this area to minimize leakage around the site.  We will see you back in 1 week to determine if drain can come out. Please see appointment below.  Call Suhaylah Wampole with any questions or concerns.

## 2016-11-16 ENCOUNTER — Ambulatory Visit: Payer: Medicare Other | Admitting: Surgery

## 2016-11-19 ENCOUNTER — Encounter: Payer: Self-pay | Admitting: Surgery

## 2016-11-19 ENCOUNTER — Ambulatory Visit (INDEPENDENT_AMBULATORY_CARE_PROVIDER_SITE_OTHER): Payer: Medicare Other | Admitting: Surgery

## 2016-11-19 ENCOUNTER — Telehealth: Payer: Self-pay

## 2016-11-19 VITALS — BP 112/52 | HR 66 | Temp 97.5°F | Ht 74.0 in | Wt 210.4 lb

## 2016-11-19 DIAGNOSIS — K811 Chronic cholecystitis: Secondary | ICD-10-CM

## 2016-11-19 DIAGNOSIS — K8000 Calculus of gallbladder with acute cholecystitis without obstruction: Secondary | ICD-10-CM

## 2016-11-19 NOTE — Progress Notes (Signed)
82 year old male who had a laparoscopic cholecystectomy and epigastric hernia repair performed on 12/7 with Dr. Dahlia Byes. He had a JP drain placed in this time of the surgery and was still having greater than 150 mL output a day. The patient has been putting out between 60 and 80 mL a day for the past 3 days and it's yellowish drainage. The patient had been feeling well and it even gone hunting some until he got a cold. The patient has had some malaise some rattle in his chest cough is nonproductive then was a lot of sinus drainage as well. Other than the cold he's been doing very well. I did call over to do [place] [facility] and he does have an appointment today with Dr. Iona Beard at 220.  Vitals:   11/19/16 1020  BP: (!) 112/52  Pulse: 66  Temp: 97.5 F (36.4 C)   PE:  Gen:NAD Res: rales and crackles in right base as well as wheezing in upper system bilaterally Cardio: RRR Abd: soft, non tender, incisions c/d/i, jp drain removed   A/P:  Patient is overall doing well given his age and JP drain was removed today. He does have a follow-up plan with his primary care doctor about his upper respiratory symptoms. I will have him follow-up with Dr. Dahlia Byes in 2-3 weeks.

## 2016-11-19 NOTE — Patient Instructions (Addendum)
We have removed your drain today. Please keep the drain site covered until wound closes.  Go to your PCP office and you have an appointment today with Dr. Iona Beard at 2:20pm.

## 2016-11-19 NOTE — Telephone Encounter (Signed)
When patient was in the office today we forgot to make a follow up appointment. I left a message on his machine stating the appointment  time and date. Appointment reminder was mailed today.

## 2016-12-13 ENCOUNTER — Other Ambulatory Visit: Payer: Self-pay

## 2016-12-15 ENCOUNTER — Encounter: Payer: Self-pay | Admitting: Surgery

## 2016-12-17 ENCOUNTER — Ambulatory Visit (INDEPENDENT_AMBULATORY_CARE_PROVIDER_SITE_OTHER): Payer: Medicare Other | Admitting: Surgery

## 2016-12-17 ENCOUNTER — Encounter: Payer: Self-pay | Admitting: Surgery

## 2016-12-17 VITALS — BP 163/63 | HR 65 | Temp 96.6°F | Ht 74.0 in | Wt 213.4 lb

## 2016-12-17 DIAGNOSIS — Z09 Encounter for follow-up examination after completed treatment for conditions other than malignant neoplasm: Secondary | ICD-10-CM

## 2016-12-17 NOTE — Patient Instructions (Signed)
Please call our office if you have any questions or concerns.  

## 2016-12-17 NOTE — Progress Notes (Signed)
S/p lap chole 11/04/16 Did great considering his age and medical problems No complaints Drain is out  PE NAD Abd: soft, Nt, incisions healing well, no infection or hernias No peritonitis  A/p Doing very  well RTC prn

## 2016-12-20 ENCOUNTER — Other Ambulatory Visit: Payer: Self-pay

## 2016-12-20 DIAGNOSIS — N401 Enlarged prostate with lower urinary tract symptoms: Secondary | ICD-10-CM

## 2016-12-20 MED ORDER — TAMSULOSIN HCL 0.4 MG PO CAPS: 0.4000 mg | ORAL_CAPSULE | Freq: Every day | ORAL | 12 refills | Status: DC

## 2017-04-12 ENCOUNTER — Encounter: Payer: Self-pay | Admitting: Family

## 2017-04-12 ENCOUNTER — Ambulatory Visit: Payer: Medicare Other | Attending: Family | Admitting: Family

## 2017-04-12 VITALS — BP 130/62 | HR 58 | Resp 20 | Ht 74.0 in | Wt 217.0 lb

## 2017-04-12 DIAGNOSIS — E1140 Type 2 diabetes mellitus with diabetic neuropathy, unspecified: Secondary | ICD-10-CM | POA: Insufficient documentation

## 2017-04-12 DIAGNOSIS — I5032 Chronic diastolic (congestive) heart failure: Secondary | ICD-10-CM | POA: Insufficient documentation

## 2017-04-12 DIAGNOSIS — Z7982 Long term (current) use of aspirin: Secondary | ICD-10-CM | POA: Insufficient documentation

## 2017-04-12 DIAGNOSIS — I10 Essential (primary) hypertension: Secondary | ICD-10-CM

## 2017-04-12 DIAGNOSIS — F329 Major depressive disorder, single episode, unspecified: Secondary | ICD-10-CM | POA: Diagnosis not present

## 2017-04-12 DIAGNOSIS — Z87891 Personal history of nicotine dependence: Secondary | ICD-10-CM | POA: Diagnosis not present

## 2017-04-12 DIAGNOSIS — I2510 Atherosclerotic heart disease of native coronary artery without angina pectoris: Secondary | ICD-10-CM | POA: Diagnosis present

## 2017-04-12 DIAGNOSIS — Z794 Long term (current) use of insulin: Secondary | ICD-10-CM | POA: Insufficient documentation

## 2017-04-12 DIAGNOSIS — E785 Hyperlipidemia, unspecified: Secondary | ICD-10-CM | POA: Insufficient documentation

## 2017-04-12 DIAGNOSIS — Z951 Presence of aortocoronary bypass graft: Secondary | ICD-10-CM | POA: Insufficient documentation

## 2017-04-12 DIAGNOSIS — J449 Chronic obstructive pulmonary disease, unspecified: Secondary | ICD-10-CM | POA: Insufficient documentation

## 2017-04-12 DIAGNOSIS — I110 Hypertensive heart disease with heart failure: Secondary | ICD-10-CM | POA: Insufficient documentation

## 2017-04-12 DIAGNOSIS — I2721 Secondary pulmonary arterial hypertension: Secondary | ICD-10-CM | POA: Insufficient documentation

## 2017-04-12 NOTE — Progress Notes (Signed)
Patient ID: Justin Leonard, male    DOB: 1927-05-05, 83 y.o.   MRN: [id number]  HPI  Mr Moll is a 83 y/o male with a history of neuropathy, HTN, hyperlipidemia, DM, depression, CAD with CABG, COPD, remote tobacco use and chronic heart failure.   Last echo was done 05/10/16 which showed an EF of 40% with mild MR and PA pressure of 48 mm Hg. EF has decreased from a previous echo done on 07/16/15 when the EF was 50-55%. Had a cardiac catheterization done on 05/10/16 which showed patent grafts although there was a blockage distal to his stent & recommended aggressive medical therapy.  Admitted 11/04/16 for laparoscopic cholecystectomy. Discharged the next day. Admitted on 09/21/16 with chest pain. Troponins were negative and cardiology consult was done. Was discharged the next day. Prior to that on 05/27/16 he was in the ER with chest pain treated and released the same day.   He presents today for a follow-up visit with a chief complaint of chronic pedal edema. He says that this has been present for "years" and worsens when he doesn't prop them up during the day. He describes the swelling as mild in nature. He has associated light-headedness along with this.   Past Medical History:  Diagnosis Date  . Anemia   . Arthritis   . Back pain   . CHF (congestive heart failure) ([place])   . COPD (chronic obstructive pulmonary disease) ([place])   . Coronary artery disease   . Deafness   . Depression   . Diabetes mellitus without complication ([place])   . Dizziness    when gets up  . GERD (gastroesophageal reflux disease)   . Hyperlipidemia   . Hypertension   . Melena   . Myocardial infarction ([place])    maybe a small one  . Obesity   . Oxygen decrease    WEARS HS,PRN  . Peripheral neuropathy   . Peripheral neuropathy    bil.hands and feet  . Skin cancer   . Umbilical hernia     Past Surgical History:  Procedure Laterality Date  . CARDIAC CATHETERIZATION Left 07/17/2015   Procedure: Right/Left  Heart Cath and Coronary Angiography;  Surgeon: [name], MD;  Location: [facility];  Service: Cardiovascular;  Laterality: Left;  . CARDIAC CATHETERIZATION N/A 07/17/2015   Procedure: Coronary Stent Intervention;  Surgeon: [name], MD;  Location: [facility];  Service: Cardiovascular;  Laterality: N/A;  . CARDIAC CATHETERIZATION Right 05/10/2016   Procedure: Left Heart Cath and Coronary Angiography;  Surgeon: [name], MD;  Location: [facility];  Service: Cardiovascular;  Laterality: Right;  . CHOLECYSTECTOMY N/A 11/04/2016   Procedure: LAPAROSCOPIC CHOLECYSTECTOMY, POSSIBLE OPEN umbilical hernia repair;  Surgeon: [name], MD;  Location: [facility];  Service: General;  Laterality: N/A;  . COLONOSCOPY WITH PROPOFOL N/A 12/12/2015   Procedure: COLONOSCOPY WITH PROPOFOL;  Surgeon: [name], MD;  Location: [facility];  Service: Gastroenterology;  Laterality: N/A;  . CORONARY ANGIOPLASTY     stent 8/16  . CORONARY ARTERY BYPASS GRAFT    . ESOPHAGOGASTRODUODENOSCOPY N/A 11/10/2015   Procedure: ESOPHAGOGASTRODUODENOSCOPY (EGD);  Surgeon: [name], MD;  Location: [facility];  Service: Endoscopy;  Laterality: N/A;  . EYE SURGERY    . gall bladder drain      Family History  Problem Relation Age of Onset  . Hypertension Father   . CVA Father   . Anemia Neg Hx   .  Arrhythmia Neg Hx   . Asthma Neg Hx   . Clotting disorder Neg Hx   . Fainting Neg Hx   . Heart attack Neg Hx   . Heart disease Neg Hx   . Heart failure Neg Hx   . Hyperlipidemia Neg Hx     Social History  Substance Use Topics  . Smoking status: Former Smoker    Packs/day: 3.00    Years: 30.00    Types: Cigarettes    Quit date: 04/16/1978  . Smokeless tobacco: Never Used     Comment: Quit smoking 1979  . Alcohol use No    No Known Allergies  Prior to Admission medications   Medication Sig Start Date End Date Taking? Authorizing Provider  acetaminophen (TYLENOL)  650 MG CR tablet Take 650 mg by mouth 2 (two) times daily.    Yes Historical Provider, MD  aspirin EC 81 MG tablet Take 81 mg by mouth daily.   Yes Historical Provider, MD  clopidogrel (PLAVIX) 75 MG tablet Take 1 tablet (75 mg total) by mouth daily. 05/12/16  Yes Vaughan Basta, MD  ferrous sulfate 325 (65 FE) MG tablet Take 325 mg by mouth daily with breakfast.   Yes Historical Provider, MD  FLUoxetine (PROZAC) 20 MG capsule Take 20 mg by mouth daily.   Yes Historical Provider, MD  furosemide (LASIX) 40 MG tablet Take 1 tablet (40 mg total) by mouth daily. 11/27/15  Yes Carrie Mew, MD  insulin glargine (LANTUS) 100 UNIT/ML injection Inject 12 Units into the skin at bedtime.    Yes Historical Provider, MD  isosorbide mononitrate (IMDUR) 30 MG 24 hr tablet Take 1 tablet (30 mg total) by mouth daily. 09/22/16  Yes Nicholes Mango, MD  lovastatin (MEVACOR) 20 MG tablet Take 20 mg by mouth at bedtime.   Yes Historical Provider, MD  metFORMIN (GLUCOPHAGE) 500 MG tablet Take 500 mg by mouth 2 (two) times daily.    Yes Historical Provider, MD  metoprolol succinate (TOPROL-XL) 25 MG 24 hr tablet Take 25 mg by mouth daily.   Yes Historical Provider, MD  nitroGLYCERIN (NITROSTAT) 0.4 MG SL tablet Place 0.4 mg under the tongue every 5 (five) minutes as needed for chest pain.   Yes Historical Provider, MD  pantoprazole (PROTONIX) 40 MG tablet Take 40 mg by mouth daily.  08/26/15  Yes Historical Provider, MD  Polyethylene Glycol 3350 (MIRALAX PO) Take 17 g by mouth daily.   Yes Historical Provider, MD  ranolazine (RANEXA) 500 MG 12 hr tablet Take 500 mg by mouth 2 (two) times daily.    Yes Historical Provider, MD  tamsulosin (FLOMAX) 0.4 MG CAPS capsule Take 1 capsule (0.4 mg total) by mouth daily after supper. 12/16/15  Yes Roda Shutters, FNP  Tiotropium Bromide Monohydrate (SPIRIVA RESPIMAT) 2.5 MCG/ACT AERS Inhale 2 puffs into the lungs See admin instructions. Inhale 2 puffs 1 to 2 times a day.    Yes Historical Provider, MD  VENTOLIN HFA 108 (90 BASE) MCG/ACT inhaler Inhale 1-2 puffs into the lungs as needed. Reported on 12/16/2015 06/24/15  Yes Historical Provider, MD     Review of Systems  Constitutional: Negative for appetite change and fatigue.  HENT: Positive for hearing loss. Negative for congestion, rhinorrhea and sore throat.   Eyes: Negative.   Respiratory: Negative for cough, chest tightness and shortness of breath.   Cardiovascular: Positive for leg swelling (if not propping up during the day). Negative for chest pain and palpitations.  Gastrointestinal: Negative for  abdominal distention and abdominal pain.  Endocrine: Negative.   Genitourinary: Negative.   Musculoskeletal: Negative for back pain and neck pain.  Skin: Negative.   Allergic/Immunologic: Negative.   Neurological: Positive for light-headedness (off balance "all the time"). Negative for dizziness.  Hematological: Negative for adenopathy. Does not bruise/bleed easily.  Psychiatric/Behavioral: Positive for dysphoric mood. Negative for sleep disturbance. The patient is not nervous/anxious.     Vitals:   04/12/17 1110  BP: 130/62  Pulse: (!) 58  Resp: 20  SpO2: 96%  Weight: 217 lb (98.4 kg)  Height: 6\' 2"  (1.88 m)    Wt Readings from Last 3 Encounters:  04/12/17 217 lb (98.4 kg)  12/17/16 213 lb 6.4 oz (96.8 kg)  11/19/16 210 lb 6.4 oz (95.4 kg)    Lab Results  Component Value Date   CREATININE 1.12 11/04/2016   CREATININE 1.11 10/25/2016   CREATININE 1.30 (H) 09/21/2016   Physical Exam  Constitutional: He is oriented to person, place, and time. He appears well-developed and well-nourished.  HENT:  Head: Normocephalic and atraumatic.  Neck: Normal range of motion. Neck supple.  Cardiovascular: Regular rhythm.  Bradycardia present.   Pulmonary/Chest: Effort normal. He has no wheezes. He has no rales.  Abdominal: Soft. He exhibits no distension. There is no tenderness.  Musculoskeletal: He  exhibits edema (1+ pitting edema in bilateral lower legs). He exhibits no tenderness.  Neurological: He is alert and oriented to person, place, and time.  Skin: Skin is warm and dry.  Psychiatric: He has a normal mood and affect. His behavior is normal. Thought content normal.  Nursing note and vitals reviewed.  Assessment & Plan:  1: Chronic heart failure with preserved ejection fraction- - NYHA class I - euvolemic - has regained some weight since his appetite has improved.  Reminded to call for an overnight weight gain of >2 pounds or a weekly weight gain of >5 pounds.  - has recently seen his cardiologist Humphrey Rolls) - not adding salt to his food and his wife tries to follow a 2000mg  sodium diet.  - encouraged him to elevate his legs as much as possible  2: HTN- - BP looks good today - continue medications at this time - saw PCP (Ol[place]) 02/23/17  3: Pulmonary artery hypertension- - most recent PA pressure reading was 48 mmg Hg in June 2017 - saw pulmonologist Raul Del) today (04/12/17)  Patient did not bring his medications nor a list. Each medication was verbally reviewed with the patient and he was encouraged to bring the bottles to every visit to confirm accuracy of list.  Return in 6 months or sooner for any questions/problems before then.

## 2017-04-12 NOTE — Patient Instructions (Signed)
Continue weighing daily and call for an overnight weight gain of > 2 pounds or a weekly weight gain of >5 pounds. 

## 2017-06-20 NOTE — Progress Notes (Signed)
9:12 AM   Justin Leonard 03-30-1927 [id number]  Referring provider: [name], [name], [place] [address]  Chief Complaint  Patient presents with  . Benign Prostatic Hypertrophy    1 year follow up    HPI: 83 yo WM with a history of urinary retention and BPH with LU TS who presents for a one year follow up.  Background history Patient is 83 year old Caucasian male with no previous urologic history presenting today for voiding trial after an acute episode of urinary retention after recent admission on 11/12/15 for sepsis and acute cholecystitis. A Foley catheter was placed during his admission with retention volume noted 900 cc. He denies any other previous urinary symptoms other than weak stream. He has never seen a urologist before and has not been on medications for an enlarged prostate. His CT scan did show a very large prostate protruding into his bladder. He also had some AKI which seemed to be resolving during admission and was attributed to his urinary retention. He was started on Flomax and passed his TOV.    10/27/16 Cr 1.11  Current Status: His IPSS score today is 3, which is mild lower urinary tract symptomatology. He is pleased with his quality life due to his urinary symptoms.  His I PSS score was 12/3.  His major complaint today is nocturia x 3.  He has had these symptoms for several years.  He denies any dysuria, hematuria or suprapubic pain.  He currently taking tamsulosin 0.4 mg daily.  He also denies any recent fevers, chills, nausea or vomiting.  He does not have a family history of PCa.      IPSS    Row Name 06/21/17 0800         International Prostate Symptom Score   How often have you had the sensation of not emptying your bladder? Not at All     How often have you had to urinate less than every two hours? Not at All     How often have you found you stopped and started again several times when you urinated? Not at All     How  often have you found it difficult to postpone urination? Not at All     How often have you had a weak urinary stream? Less than 1 in 5 times     How often have you had to strain to start urination? Not at All     How many times did you typically get up at night to urinate? 2 Times     Total IPSS Score 3       Quality of Life due to urinary symptoms   If you were to spend the rest of your life with your urinary condition just the way it is now how would you feel about that? Pleased        Score:  1-7 Mild 8-19 Moderate 20-35 Severe   PMH: Past Medical History:  Diagnosis Date  . Anemia   . Arthritis   . Back pain   . CHF (congestive heart failure) ([place])   . COPD (chronic obstructive pulmonary disease) ([place])   . Coronary artery disease   . Deafness   . Depression   . Diabetes mellitus without complication ([place])   . Dizziness    when gets up  . GERD (gastroesophageal reflux disease)   . Hyperlipidemia   . Hypertension   . Melena   . Myocardial infarction ([place])  maybe a small one  . Obesity   . Oxygen decrease    WEARS HS,PRN  . Peripheral neuropathy   . Peripheral neuropathy    bil.hands and feet  . Skin cancer   . Umbilical hernia     Surgical History: Past Surgical History:  Procedure Laterality Date  . CARDIAC CATHETERIZATION Left 07/17/2015   Procedure: Right/Left Heart Cath and Coronary Angiography;  Surgeon: [name], MD;  Location: [facility];  Service: Cardiovascular;  Laterality: Left;  . CARDIAC CATHETERIZATION N/A 07/17/2015   Procedure: Coronary Stent Intervention;  Surgeon: [name], MD;  Location: [facility];  Service: Cardiovascular;  Laterality: N/A;  . CARDIAC CATHETERIZATION Right 05/10/2016   Procedure: Left Heart Cath and Coronary Angiography;  Surgeon: [name], MD;  Location: [facility];  Service: Cardiovascular;  Laterality: Right;  . CHOLECYSTECTOMY N/A 11/04/2016   Procedure: LAPAROSCOPIC  CHOLECYSTECTOMY, POSSIBLE OPEN umbilical hernia repair;  Surgeon: [name], MD;  Location: [facility];  Service: General;  Laterality: N/A;  . COLONOSCOPY WITH PROPOFOL N/A 12/12/2015   Procedure: COLONOSCOPY WITH PROPOFOL;  Surgeon: [name], MD;  Location: [facility];  Service: Gastroenterology;  Laterality: N/A;  . CORONARY ANGIOPLASTY     stent 8/16  . CORONARY ARTERY BYPASS GRAFT    . ESOPHAGOGASTRODUODENOSCOPY N/A 11/10/2015   Procedure: ESOPHAGOGASTRODUODENOSCOPY (EGD);  Surgeon: [name], MD;  Location: [facility];  Service: Endoscopy;  Laterality: N/A;  . EYE SURGERY    . gall bladder drain    . MOHS SURGERY      Home Medications:  Allergies as of 06/21/2017   No Known Allergies     Medication List       Accurate as of 06/21/17  9:12 AM. Always use your most recent med list.          acetaminophen 650 MG CR tablet Commonly known as:  TYLENOL Take 650 mg by mouth 2 (two) times daily as needed for pain.   aspirin EC 81 MG tablet Take 81 mg by mouth daily.   clopidogrel 75 MG tablet Commonly known as:  PLAVIX Take 1 tablet (75 mg total) by mouth daily.   ferrous sulfate 325 (65 FE) MG tablet Take 325 mg by mouth 2 (two) times daily.   FLUoxetine 20 MG capsule Commonly known as:  PROZAC Take 20 mg by mouth daily.   FLUoxetine 10 MG capsule Commonly known as:  PROZAC Take 10 mg once daily plus 20 mg for a total of 30 mg   furosemide 40 MG tablet Commonly known as:  LASIX Take 1 tablet (40 mg total) by mouth daily.   HYDROcodone-acetaminophen 5-325 MG tablet Commonly known as:  NORCO/VICODIN   insulin glargine 100 UNIT/ML injection Commonly known as:  LANTUS Inject 12 Units into the skin at bedtime.   isosorbide mononitrate 30 MG 24 hr tablet Commonly known as:  IMDUR Take 1 tablet (30 mg total) by mouth daily.   lovastatin 20 MG tablet Commonly known as:  MEVACOR Take 20 mg by mouth at bedtime.   metFORMIN 500 MG tablet Commonly known  as:  GLUCOPHAGE Take 500 mg by mouth 2 (two) times daily.   metoprolol succinate 25 MG 24 hr tablet Commonly known as:  TOPROL-XL Take 25 mg by mouth daily.   MIRALAX PO Take 17 g by mouth daily.   nitroGLYCERIN 0.4 MG SL tablet Commonly known as:  NITROSTAT Place 0.4 mg under the  tongue every 5 (five) minutes as needed for chest pain.   OXYGEN Place 2 L into the nose Nightly.   pantoprazole 40 MG tablet Commonly known as:  PROTONIX Take 40 mg by mouth daily.   ranolazine 500 MG 12 hr tablet Commonly known as:  RANEXA Take 500 mg by mouth 2 (two) times daily.   SPIRIVA RESPIMAT 2.5 MCG/ACT Aers Generic drug:  Tiotropium Bromide Monohydrate Inhale 2 puffs into the lungs See admin instructions. Inhale 2 puffs 1 to 2 times a day.   tamsulosin 0.4 MG Caps capsule Commonly known as:  FLOMAX Take 1 capsule (0.4 mg total) by mouth daily after supper.   VENTOLIN HFA 108 (90 Base) MCG/ACT inhaler Generic drug:  albuterol Inhale 1-2 puffs into the lungs as needed for wheezing or shortness of breath. Reported on 12/16/2015       Allergies: No Known Allergies  Family History: Family History  Problem Relation Age of Onset  . Hypertension Father   . CVA Father   . Anemia Neg Hx   . Arrhythmia Neg Hx   . Asthma Neg Hx   . Clotting disorder Neg Hx   . Fainting Neg Hx   . Heart attack Neg Hx   . Heart disease Neg Hx   . Heart failure Neg Hx   . Hyperlipidemia Neg Hx   . Prostate cancer Neg Hx   . Bladder Cancer Neg Hx   . Kidney cancer Neg Hx     Social History:  reports that he quit smoking about 39 years ago. His smoking use included Cigarettes. He has a 90.00 pack-year smoking history. He has never used smokeless tobacco. He reports that he does not drink alcohol or use drugs.  ROS: UROLOGY Frequent Urination?: No Hard to postpone urination?: No Burning/pain with urination?: No Get up at night to urinate?: Yes Leakage of urine?: No Urine stream starts and  stops?: No Trouble starting stream?: No Do you have to strain to urinate?: No Blood in urine?: No Urinary tract infection?: No Sexually transmitted disease?: No Injury to kidneys or bladder?: No Painful intercourse?: No Weak stream?: No Erection problems?: No Penile pain?: No  Gastrointestinal Nausea?: No Vomiting?: No Indigestion/heartburn?: No Diarrhea?: No Constipation?: No  Constitutional Fever: No Night sweats?: No Weight loss?: No Fatigue?: No  Skin Skin rash/lesions?: Yes Itching?: Yes  Eyes Blurred vision?: No Double vision?: No  Ears/Nose/Throat Sore throat?: No Sinus problems?: No  Hematologic/Lymphatic Swollen glands?: No Easy bruising?: No  Cardiovascular Leg swelling?: No Chest pain?: No  Respiratory Cough?: No Shortness of breath?: No  Endocrine Excessive thirst?: No  Musculoskeletal Back pain?: Yes Joint pain?: No  Neurological Headaches?: No Dizziness?: Yes  Psychologic Depression?: No Anxiety?: No  Physical Exam: BP (!) 144/63   Pulse 69   Ht 6\' 2"  (1.88 m)   Wt 211 lb 11.2 oz (96 kg)   BMI 27.18 kg/m   Constitutional: Well nourished. Alert and oriented, No acute distress. HEENT: [place] AT, moist mucus membranes. Trachea midline, no masses. Cardiovascular: No clubbing, cyanosis, or edema. Respiratory: Normal respiratory effort, no increased work of breathing. GI: Abdomen is soft, non tender, non distended, no abdominal masses. Liver and spleen not palpable.  No hernias appreciated.  Stool sample for occult testing is not indicated.   GU: No CVA tenderness.  No bladder fullness or masses.  Patient with uncircumcised phallus. Foreskin easily retracted  Urethral meatus is patent.  No penile discharge. No penile lesions or rashes. Scrotum without  lesions, cysts, rashes and/or edema.  Testicles are located scrotally bilaterally. No masses are appreciated in the testicles. Left and right epididymis are normal. Rectal: Patient with   normal sphincter tone. Anus and perineum without scarring or rashes. No rectal masses are appreciated. Prostate is approximately 55 grams, no nodules are appreciated. Seminal vesicles are normal. Skin: No rashes, bruises or suspicious lesions. Lymph: No cervical or inguinal adenopathy. Neurologic: Grossly intact, no focal deficits, moving all 4 extremities. Psychiatric: Normal mood and affect.  Laboratory Data: Results for orders placed or performed during the hospital encounter of 11/04/16  Glucose, capillary  Result Value Ref Range   Glucose-Capillary 104 (H) 65 - 99 mg/dL  CBC  Result Value Ref Range   WBC 6.9 3.8 - 10.6 K/uL   RBC 3.22 (L) 4.40 - 5.90 MIL/uL   Hemoglobin 10.0 (L) 13.0 - 18.0 g/dL   HCT 29.2 (L) 40.0 - 52.0 %   MCV 90.6 80.0 - 100.0 fL   MCH 31.1 26.0 - 34.0 pg   MCHC 34.3 32.0 - 36.0 g/dL   RDW 14.0 11.5 - 14.5 %   Platelets 184 150 - 440 K/uL  Creatinine, serum  Result Value Ref Range   Creatinine, Ser 1.12 0.61 - 1.24 mg/dL   GFR calc non Af Amer 57 (L) >60 mL/min   GFR calc Af Amer >60 >60 mL/min  Glucose, capillary  Result Value Ref Range   Glucose-Capillary 137 (H) 65 - 99 mg/dL  Glucose, capillary  Result Value Ref Range   Glucose-Capillary 126 (H) 65 - 99 mg/dL  Glucose, capillary  Result Value Ref Range   Glucose-Capillary 129 (H) 65 - 99 mg/dL  Glucose, capillary  Result Value Ref Range   Glucose-Capillary 162 (H) 65 - 99 mg/dL  Glucose, capillary  Result Value Ref Range   Glucose-Capillary 115 (H) 65 - 99 mg/dL  Surgical pathology  Result Value Ref Range   SURGICAL PATHOLOGY      Surgical Pathology CASE: [id number] PATIENT: Justin Leonard Surgical Pathology Report     SPECIMEN SUBMITTED: A. Hernia sac, umbilical B. Gallbladder  CLINICAL HISTORY: None provided  PRE-OPERATIVE DIAGNOSIS: Chronic cholecystitis umbilical hernia  POST-OPERATIVE DIAGNOSIS: Same as pre-op     DIAGNOSIS: A. UMBILICAL HERNIA SAC; REPAIR: -  MESOTHELIAL LINED FIBROADIPOSE TISSUE, CONSISTENT WITH HERNIA SAC.  B. GALLBLADDER; CHOLECYSTECTOMY: - ACUTE AND CHRONIC CHOLECYSTITIS. - CHOLELITHIASIS. - NEGATIVE FOR DYSPLASIA AND MALIGNANCY.   GROSS DESCRIPTION:  A. Labeled: umbilical hernia sac  Tissue fragment(s): 1  Size: 9.5 x 4.3 x 1.1 cm  Description: fragment of yellow lobulated fibrous and fatty tissue, sectioned  Representative submitted in 1 cassette(s).   B. Labeled: gallbladder  Size of specimen: 7.6 x 3.5 x 2.3 cm  Previously opened: focally  External surface: wrinkled pink-tan with adherent fibrofatty tissue  Wall thickness: 0 .8 cm  Mucosa: velvety red  Stones present: 1- 0.6 x 0.5 x 0.2 cm black stone  Other findings: duct margin inked blue  Block summary: 1 - 2-representative section and en face cystic duct margin    Final Diagnosis performed by Delorse Lek, MD.  Electronically signed 11/05/2016 1:41:09PM    The electronic signature indicates that the named Attending Pathologist has evaluated the specimen  Technical component performed at [facility], [address] [place], [place] [address] Lab: [phone] Dir: Darrick Penna. Evette Doffing, MD  Professional component performed at [facility], [facility], [place], [place], [place] [address] Lab: [phone] Dir: Dellia Nims. Reuel Derby, MD  I have reviewed labs.     Assessment & Plan:   1. History of urinary retention-  Patient reports that he has been voiding well.    2. BPH with LUTS  - IPSS score is 3/1, it is improving  - Continue tamsulosin 0.4 mg daily; refills given  - RTC in 12 months for IPSS and exam   Return in about 1 year (around 06/21/2018) for I PSS and exam.  These notes generated with voice recognition software. I apologize for typographical errors.  Zara Council, [facility] [address], [place], [place] [address] [phone]

## 2017-06-21 ENCOUNTER — Ambulatory Visit: Payer: Medicare Other | Admitting: Urology

## 2017-06-21 ENCOUNTER — Encounter: Payer: Self-pay | Admitting: Urology

## 2017-06-21 VITALS — BP 144/63 | HR 69 | Ht 74.0 in | Wt 211.7 lb

## 2017-06-21 DIAGNOSIS — Z87898 Personal history of other specified conditions: Secondary | ICD-10-CM | POA: Diagnosis not present

## 2017-06-21 DIAGNOSIS — N138 Other obstructive and reflux uropathy: Secondary | ICD-10-CM

## 2017-06-21 DIAGNOSIS — N401 Enlarged prostate with lower urinary tract symptoms: Secondary | ICD-10-CM | POA: Diagnosis not present

## 2017-06-21 MED ORDER — TAMSULOSIN HCL 0.4 MG PO CAPS: 0.4000 mg | ORAL_CAPSULE | Freq: Every day | ORAL | 12 refills | Status: DC

## 2017-08-16 DIAGNOSIS — D481 Neoplasm of uncertain behavior of connective and other soft tissue: Secondary | ICD-10-CM | POA: Insufficient documentation

## 2017-10-12 ENCOUNTER — Encounter: Payer: Self-pay | Admitting: Family

## 2017-10-12 ENCOUNTER — Other Ambulatory Visit: Payer: Self-pay

## 2017-10-12 ENCOUNTER — Ambulatory Visit: Payer: Medicare Other | Attending: Family | Admitting: Family

## 2017-10-12 VITALS — BP 156/60 | HR 60 | Resp 18 | Ht 74.0 in | Wt 220.2 lb

## 2017-10-12 DIAGNOSIS — Z823 Family history of stroke: Secondary | ICD-10-CM | POA: Insufficient documentation

## 2017-10-12 DIAGNOSIS — J449 Chronic obstructive pulmonary disease, unspecified: Secondary | ICD-10-CM | POA: Insufficient documentation

## 2017-10-12 DIAGNOSIS — I2510 Atherosclerotic heart disease of native coronary artery without angina pectoris: Secondary | ICD-10-CM | POA: Diagnosis not present

## 2017-10-12 DIAGNOSIS — Z85828 Personal history of other malignant neoplasm of skin: Secondary | ICD-10-CM | POA: Insufficient documentation

## 2017-10-12 DIAGNOSIS — I110 Hypertensive heart disease with heart failure: Secondary | ICD-10-CM | POA: Diagnosis not present

## 2017-10-12 DIAGNOSIS — H9190 Unspecified hearing loss, unspecified ear: Secondary | ICD-10-CM | POA: Diagnosis not present

## 2017-10-12 DIAGNOSIS — Z794 Long term (current) use of insulin: Secondary | ICD-10-CM | POA: Diagnosis not present

## 2017-10-12 DIAGNOSIS — Z7982 Long term (current) use of aspirin: Secondary | ICD-10-CM | POA: Insufficient documentation

## 2017-10-12 DIAGNOSIS — Z23 Encounter for immunization: Secondary | ICD-10-CM | POA: Insufficient documentation

## 2017-10-12 DIAGNOSIS — Z79899 Other long term (current) drug therapy: Secondary | ICD-10-CM | POA: Diagnosis not present

## 2017-10-12 DIAGNOSIS — Z951 Presence of aortocoronary bypass graft: Secondary | ICD-10-CM | POA: Diagnosis not present

## 2017-10-12 DIAGNOSIS — E1140 Type 2 diabetes mellitus with diabetic neuropathy, unspecified: Secondary | ICD-10-CM | POA: Diagnosis not present

## 2017-10-12 DIAGNOSIS — I252 Old myocardial infarction: Secondary | ICD-10-CM | POA: Insufficient documentation

## 2017-10-12 DIAGNOSIS — E669 Obesity, unspecified: Secondary | ICD-10-CM | POA: Insufficient documentation

## 2017-10-12 DIAGNOSIS — R42 Dizziness and giddiness: Secondary | ICD-10-CM | POA: Diagnosis not present

## 2017-10-12 DIAGNOSIS — E785 Hyperlipidemia, unspecified: Secondary | ICD-10-CM | POA: Diagnosis not present

## 2017-10-12 DIAGNOSIS — J410 Simple chronic bronchitis: Secondary | ICD-10-CM

## 2017-10-12 DIAGNOSIS — F329 Major depressive disorder, single episode, unspecified: Secondary | ICD-10-CM | POA: Diagnosis not present

## 2017-10-12 DIAGNOSIS — K219 Gastro-esophageal reflux disease without esophagitis: Secondary | ICD-10-CM | POA: Insufficient documentation

## 2017-10-12 DIAGNOSIS — Z87891 Personal history of nicotine dependence: Secondary | ICD-10-CM | POA: Diagnosis not present

## 2017-10-12 DIAGNOSIS — I5032 Chronic diastolic (congestive) heart failure: Secondary | ICD-10-CM | POA: Insufficient documentation

## 2017-10-12 DIAGNOSIS — I10 Essential (primary) hypertension: Secondary | ICD-10-CM

## 2017-10-12 NOTE — Progress Notes (Signed)
Patient ID: Justin Leonard, male    DOB: January 05, 1927, 83 y.o.   MRN: [id number]  HPI  [name] is a 83 y/o male with a history of neuropathy, HTN, hyperlipidemia, DM, depression, CAD with CABG, COPD, remote tobacco use and chronic heart failure.   Last echo was done 05/10/16 which showed an EF of 40% with mild MR and PA pressure of 48 mm Hg. EF has decreased from a previous echo done on 07/16/15 when the EF was 50-55%. Had a cardiac catheterization done on 05/10/16 which showed patent grafts although there was a blockage distal to his stent & recommended aggressive medical therapy.  Has not been admitted or in the ED in the last 6 months.   He presents today for a follow-up visit with a chief complaint of minimal light-headedness with position change. He describes this as chronic in nature having been present for several years with varying levels of severity. He has associated edema at times along with improving depression. He denies any fatigue, chest pain, cough, shortness of breath or difficulty sleeping.   Past Medical History:  Diagnosis Date  . Anemia   . Arthritis   . Back pain   . CHF (congestive heart failure) ([place])   . COPD (chronic obstructive pulmonary disease) ([place])   . Coronary artery disease   . Deafness   . Depression   . Diabetes mellitus without complication ([place])   . Dizziness    when gets up  . GERD (gastroesophageal reflux disease)   . Hyperlipidemia   . Hypertension   . Melena   . Myocardial infarction ([name])    maybe a small one  . Obesity   . Oxygen decrease    WEARS HS,PRN  . Peripheral neuropathy   . Peripheral neuropathy    bil.hands and feet  . Skin cancer   . Umbilical hernia     Past Surgical History:  Procedure Laterality Date  . CORONARY ANGIOPLASTY     stent 8/16  . CORONARY ARTERY BYPASS GRAFT    . EYE SURGERY    . gall bladder drain    . MOHS SURGERY      Family History  Problem Relation Age of Onset  . Hypertension Father   .  CVA Father   . Anemia Neg Hx   . Arrhythmia Neg Hx   . Asthma Neg Hx   . Clotting disorder Neg Hx   . Fainting Neg Hx   . Heart attack Neg Hx   . Heart disease Neg Hx   . Heart failure Neg Hx   . Hyperlipidemia Neg Hx   . Prostate cancer Neg Hx   . Bladder Cancer Neg Hx   . Kidney cancer Neg Hx     Social History   Tobacco Use  . Smoking status: Former Smoker    Packs/day: 3.00    Years: 30.00    Pack years: 90.00    Types: Cigarettes    Last attempt to quit: 04/16/1978    Years since quitting: 39.5  . Smokeless tobacco: Never Used  . Tobacco comment: Quit smoking 1979  Substance Use Topics  . Alcohol use: No    Alcohol/week: 0.0 oz    No Known Allergies  Prior to Admission medications   Medication Sig Start Date End Date Taking? Authorizing Provider  acetaminophen (TYLENOL) 650 MG CR tablet Take 650 mg by mouth 2 (two) times daily as needed for pain.    Yes [provider]  aspirin EC 81 MG tablet Take 81 mg by mouth daily.   Yes [provider]  clopidogrel (PLAVIX) 75 MG tablet Take 1 tablet (75 mg total) by mouth daily. 05/12/16  Yes Vaughan Basta, MD  ferrous sulfate 325 (65 FE) MG tablet Take 325 mg by mouth 2 (two) times daily.    Yes [provider]  FLUoxetine (PROZAC) 10 MG capsule Take 10 mg once daily plus 20 mg for a total of 40 mg 05/24/17  Yes [provider]  furosemide (LASIX) 40 MG tablet Take 1 tablet (40 mg total) by mouth daily. 11/27/15  Yes Carrie Mew, MD  HYDROcodone-acetaminophen (NORCO/VICODIN) 5-325 MG tablet  11/07/16  Yes [provider]  insulin glargine (LANTUS) 100 UNIT/ML injection Inject 18 Units at bedtime into the skin.    Yes [provider]  isosorbide mononitrate (IMDUR) 30 MG 24 hr tablet Take 1 tablet (30 mg total) by mouth daily. 09/22/16  Yes [name], [name], MD  lovastatin (MEVACOR) 20 MG tablet Take 20 mg by mouth at bedtime.   Yes [provider]   metFORMIN (GLUCOPHAGE) 500 MG tablet Take 500 mg by mouth 2 (two) times daily.    Yes [provider]  metoprolol succinate (TOPROL-XL) 25 MG 24 hr tablet Take 25 mg by mouth daily.   Yes [provider]  nitroGLYCERIN (NITROSTAT) 0.4 MG SL tablet Place 0.4 mg under the tongue every 5 (five) minutes as needed for chest pain.   Yes [provider]  OXYGEN Place 2 L into the nose Nightly.   Yes [provider]  pantoprazole (PROTONIX) 40 MG tablet Take 40 mg by mouth daily.  08/26/15  Yes [provider]  Polyethylene Glycol 3350 (MIRALAX PO) Take 17 g by mouth daily.   Yes [provider]  ranolazine (RANEXA) 500 MG 12 hr tablet Take 500 mg by mouth 2 (two) times daily.    Yes [provider]  tamsulosin (FLOMAX) 0.4 MG CAPS capsule Take 1 capsule (0.4 mg total) by mouth daily after supper. 06/21/17  Yes McGowan, [name], PA-C  Tiotropium Bromide Monohydrate (SPIRIVA RESPIMAT) 2.5 MCG/ACT AERS Inhale 2 puffs into the lungs See admin instructions. Inhale 2 puffs 1 to 2 times a day.   Yes [provider]  VENTOLIN HFA 108 (90 BASE) MCG/ACT inhaler Inhale 1-2 puffs into the lungs as needed for wheezing or shortness of breath. Reported on 12/16/2015 06/24/15  Yes [provider]   Review of Systems  Constitutional: Negative for appetite change and fatigue.  HENT: Positive for hearing loss. Negative for congestion, rhinorrhea and sore throat.   Eyes: Negative.   Respiratory: Negative for cough, chest tightness and shortness of breath.   Cardiovascular: Positive for leg swelling (if not propping up during the day). Negative for chest pain and palpitations.  Gastrointestinal: Negative for abdominal distention and abdominal pain.  Endocrine: Negative.   Genitourinary: Negative.   Musculoskeletal: Negative for back pain and neck pain.  Skin: Negative.   Allergic/Immunologic: Negative.   Neurological: Positive for  light-headedness (off balance "all the time"). Negative for dizziness.  Hematological: Negative for adenopathy. Does not bruise/bleed easily.  Psychiatric/Behavioral: Positive for dysphoric mood. Negative for sleep disturbance. The patient is not nervous/anxious.    Vitals:   10/12/17 1111  BP: (!) 156/60  Pulse: 60  Resp: 18  SpO2: 96%  Weight: 220 lb 4 oz (99.9 kg)  Height: 6\' 2"  (1.88 m)   Wt Readings from Last 3  Encounters:  10/12/17 220 lb 4 oz (99.9 kg)  06/21/17 211 lb 11.2 oz (96 kg)  04/12/17 217 lb (98.4 kg)    Lab Results  Component Value Date   CREATININE 1.12 11/04/2016   CREATININE 1.11 10/25/2016   CREATININE 1.30 (H) 09/21/2016   Physical Exam  Constitutional: He is oriented to person, place, and time. He appears well-developed and well-nourished.  HENT:  Head: Normocephalic and atraumatic.  Neck: Normal range of motion. Neck supple.  Cardiovascular: Regular rhythm. Bradycardia present.  Pulmonary/Chest: Effort normal. He has no wheezes. He has no rales.  Abdominal: Soft. He exhibits no distension. There is no tenderness.  Musculoskeletal: He exhibits edema (trace pitting edema in bilateral lower legs). He exhibits no tenderness.  Neurological: He is alert and oriented to person, place, and time.  Skin: Skin is warm and dry.  Psychiatric: He has a normal mood and affect. His behavior is normal. Thought content normal.  Nursing note and vitals reviewed.  Assessment & Plan:  1: Chronic heart failure with preserved ejection fraction- - NYHA class I - euvolemic - weighing daily.  Reminded to call for an overnight weight gain of >2 pounds or a weekly weight gain of >5 pounds.  - has recently seen his cardiologist Humphrey Rolls) - not adding salt to his food and his wife tries to follow a 2000mg  sodium diet.  - encouraged him to elevate his legs as much as possible - patient reports receiving flu vaccine for this season  2: HTN- - BP looks good today - continue  medications at this time - BMP from 10/25/16 reviewed and showed sodium 139, potassium 4.5 and GFR 57 - saw PCP (Olmedo) 07/25/17  3: COPD- - most recent PA pressure reading was 48 mmg Hg in June 2017 - saw pulmonologist Raul Del) 10/06/17  Patient did not bring his medications nor a list. Each medication was verbally reviewed with the patient and he was encouraged to bring the bottles to every visit to confirm accuracy of list.  Return in 1 year of sooner for any questions/problems before then.

## 2017-10-12 NOTE — Patient Instructions (Signed)
Continue weighing daily and call for an overnight weight gain of > 2 pounds or a weekly weight gain of >5 pounds. 

## 2017-12-04 ENCOUNTER — Other Ambulatory Visit: Payer: Self-pay

## 2017-12-04 ENCOUNTER — Observation Stay
Admission: EM | Admit: 2017-12-04 | Discharge: 2017-12-05 | Disposition: A | Discharge status: Home / Self Care | Payer: Medicare Other | Attending: Internal Medicine | Admitting: Internal Medicine

## 2017-12-04 ENCOUNTER — Emergency Department: Payer: Medicare Other

## 2017-12-04 DIAGNOSIS — Z794 Long term (current) use of insulin: Secondary | ICD-10-CM | POA: Diagnosis not present

## 2017-12-04 DIAGNOSIS — R0789 Other chest pain: Principal | ICD-10-CM | POA: Insufficient documentation

## 2017-12-04 DIAGNOSIS — I252 Old myocardial infarction: Secondary | ICD-10-CM | POA: Insufficient documentation

## 2017-12-04 DIAGNOSIS — Z79899 Other long term (current) drug therapy: Secondary | ICD-10-CM | POA: Insufficient documentation

## 2017-12-04 DIAGNOSIS — K219 Gastro-esophageal reflux disease without esophagitis: Secondary | ICD-10-CM | POA: Insufficient documentation

## 2017-12-04 DIAGNOSIS — E669 Obesity, unspecified: Secondary | ICD-10-CM | POA: Diagnosis not present

## 2017-12-04 DIAGNOSIS — J449 Chronic obstructive pulmonary disease, unspecified: Secondary | ICD-10-CM | POA: Insufficient documentation

## 2017-12-04 DIAGNOSIS — Z6827 Body mass index (BMI) 27.0-27.9, adult: Secondary | ICD-10-CM | POA: Diagnosis not present

## 2017-12-04 DIAGNOSIS — Z7982 Long term (current) use of aspirin: Secondary | ICD-10-CM | POA: Diagnosis not present

## 2017-12-04 DIAGNOSIS — F329 Major depressive disorder, single episode, unspecified: Secondary | ICD-10-CM | POA: Insufficient documentation

## 2017-12-04 DIAGNOSIS — R079 Chest pain, unspecified: Secondary | ICD-10-CM | POA: Diagnosis present

## 2017-12-04 DIAGNOSIS — Z87891 Personal history of nicotine dependence: Secondary | ICD-10-CM | POA: Diagnosis not present

## 2017-12-04 DIAGNOSIS — E1140 Type 2 diabetes mellitus with diabetic neuropathy, unspecified: Secondary | ICD-10-CM | POA: Diagnosis not present

## 2017-12-04 DIAGNOSIS — Z85828 Personal history of other malignant neoplasm of skin: Secondary | ICD-10-CM | POA: Insufficient documentation

## 2017-12-04 DIAGNOSIS — I110 Hypertensive heart disease with heart failure: Secondary | ICD-10-CM | POA: Insufficient documentation

## 2017-12-04 DIAGNOSIS — I2510 Atherosclerotic heart disease of native coronary artery without angina pectoris: Secondary | ICD-10-CM | POA: Insufficient documentation

## 2017-12-04 DIAGNOSIS — I5032 Chronic diastolic (congestive) heart failure: Secondary | ICD-10-CM | POA: Diagnosis not present

## 2017-12-04 DIAGNOSIS — D649 Anemia, unspecified: Secondary | ICD-10-CM | POA: Insufficient documentation

## 2017-12-04 LAB — CBC
HCT: 22.2 % — ABNORMAL LOW (ref 40.0–52.0)
Hemoglobin: 7.7 g/dL — ABNORMAL LOW (ref 13.0–18.0)
MCH: 32.2 pg (ref 26.0–34.0)
MCHC: 34.6 g/dL (ref 32.0–36.0)
MCV: 92.9 fL (ref 80.0–100.0)
Platelets: 137 10*3/uL — ABNORMAL LOW (ref 150–440)
RBC: 2.39 MIL/uL — ABNORMAL LOW (ref 4.40–5.90)
RDW: 13.2 % (ref 11.5–14.5)
WBC: 5 10*3/uL (ref 3.8–10.6)

## 2017-12-04 LAB — BASIC METABOLIC PANEL
Anion gap: 7 (ref 5–15)
BUN: 22 mg/dL — AB (ref 6–20)
CALCIUM: 9.5 mg/dL (ref 8.9–10.3)
CHLORIDE: 104 mmol/L (ref 101–111)
CO2: 28 mmol/L (ref 22–32)
CREATININE: 1.27 mg/dL — AB (ref 0.61–1.24)
GFR calc non Af Amer: 49 mL/min — ABNORMAL LOW (ref >60)
GFR, EST AFRICAN AMERICAN: 56 mL/min — AB (ref >60)
GLUCOSE: 170 mg/dL — AB (ref 65–99)
POTASSIUM: 4 mmol/L (ref 3.5–5.1)
SODIUM: 139 mmol/L (ref 135–145)

## 2017-12-04 LAB — TROPONIN I

## 2017-12-04 NOTE — ED Notes (Signed)
Pt ate dinner tray.  Sinus brady on monitor at 57.

## 2017-12-04 NOTE — ED Provider Notes (Signed)
[facility] Emergency Department Provider Note ____________________________________________   None    (approximate)  I have reviewed the triage vital signs and the nursing notes.   HISTORY  Chief Complaint Chest Pain    HPI Justin Leonard is a 84 y.o. male with past medical history as noted below who presents with chest pain, relatively acute onset this afternoon, occurring after he ate, and then waxing and waning for the next several hours.  He states it is now mostly resolved.  Pain is mostly described as sharp and it was nonradiating.  He denies associated shortness of breath, lightheadedness or dizziness, generalized weakness or fatigue, or fevers.  There is no cough.  Patient has otherwise been in his usual state of health until this afternoon.  Past Medical History:  Diagnosis Date  . Anemia   . Arthritis   . Back pain   . CHF (congestive heart failure) ([place])   . COPD (chronic obstructive pulmonary disease) ([place])   . Coronary artery disease   . Deafness   . Depression   . Diabetes mellitus without complication ([place])   . Dizziness    when gets up  . GERD (gastroesophageal reflux disease)   . Hyperlipidemia   . Hypertension   . Melena   . Myocardial infarction ([place])    maybe a small one  . Obesity   . Oxygen decrease    WEARS HS,PRN  . Peripheral neuropathy   . Peripheral neuropathy    bil.hands and feet  . Skin cancer   . Umbilical hernia     Patient Active Problem List   Diagnosis Date Noted  . Epigastric hernia   . PAH (pulmonary artery hypertension) ([place]) 10/13/2016  . Abdominal pain, bilateral upper quadrant 05/09/2016  . Diabetic hypoglycemia ([place]) 10/13/2015  . Chronic diastolic heart failure ([place]) 04/17/2015  . HTN (hypertension) 04/17/2015  . Diabetes ([place]) 04/17/2015  . COPD (chronic obstructive pulmonary disease) ([place]) 04/17/2015  . Clinical depression 08/23/2012  . Melanoma in situ ([place]) 08/23/2012  . Exomphalos  08/23/2012  . Major depressive disorder with single episode 08/23/2012  . Difficulty hearing 01/18/2012  . HLD (hyperlipidemia) 01/18/2012  . Peripheral nerve disease 01/18/2012  . Adiposity 01/18/2012    Past Surgical History:  Procedure Laterality Date  . CARDIAC CATHETERIZATION Left 07/17/2015   Procedure: Right/Left Heart Cath and Coronary Angiography;  Surgeon: [name], MD;  Location: [facility];  Service: Cardiovascular;  Laterality: Left;  . CARDIAC CATHETERIZATION N/A 07/17/2015   Procedure: Coronary Stent Intervention;  Surgeon: [name], MD;  Location: [place] [facility];  Service: Cardiovascular;  Laterality: N/A;  . CARDIAC CATHETERIZATION Right 05/10/2016   Procedure: Left Heart Cath and Coronary Angiography;  Surgeon: [name], MD;  Location: [place] [facility];  Service: Cardiovascular;  Laterality: Right;  . CHOLECYSTECTOMY N/A 11/04/2016   Procedure: LAPAROSCOPIC CHOLECYSTECTOMY, POSSIBLE OPEN umbilical hernia repair;  Surgeon: [name], MD;  Location: [facility] ORS;  Service: General;  Laterality: N/A;  . COLONOSCOPY WITH PROPOFOL N/A 12/12/2015   Procedure: COLONOSCOPY WITH PROPOFOL;  Surgeon: [name], MD;  Location: [facility] ENDOSCOPY;  Service: Gastroenterology;  Laterality: N/A;  . CORONARY ANGIOPLASTY     stent 8/16  . CORONARY ARTERY BYPASS GRAFT    . ESOPHAGOGASTRODUODENOSCOPY N/A 11/10/2015   Procedure: ESOPHAGOGASTRODUODENOSCOPY (EGD);  Surgeon: [name], MD;  Location: [facility] ENDOSCOPY;  Service: Endoscopy;  Laterality: N/A;  . EYE SURGERY    .  gall bladder drain    . MOHS SURGERY      Prior to Admission medications   Medication Sig Start Date End Date Taking? Authorizing Provider  acetaminophen (TYLENOL) 650 MG CR tablet Take 650 mg by mouth 2 (two) times daily as needed for pain.    Yes [provider]  aspirin EC 81 MG tablet Take 81 mg by mouth daily.   Yes [provider]  clopidogrel (PLAVIX) 75 MG  tablet Take 1 tablet (75 mg total) by mouth daily. 05/12/16  Yes Vaughan Basta, MD  ferrous sulfate 325 (65 FE) MG tablet Take 325 mg by mouth 2 (two) times daily.    Yes [provider]  FLUoxetine (PROZAC) 40 MG capsule Take 40mg  by mouth every morning 05/24/17  Yes [provider]  furosemide (LASIX) 40 MG tablet Take 1 tablet (40 mg total) by mouth daily. 11/27/15  Yes Carrie Mew, MD  HYDROcodone-acetaminophen (NORCO/VICODIN) 5-325 MG tablet  11/07/16  Yes [provider]  insulin glargine (LANTUS) 100 UNIT/ML injection Inject 15-18 Units into the skin at bedtime.    Yes [provider]  isosorbide mononitrate (IMDUR) 30 MG 24 hr tablet Take 1 tablet (30 mg total) by mouth daily. 09/22/16  Yes [name], [name], MD  lovastatin (MEVACOR) 20 MG tablet Take 20 mg by mouth at bedtime.   Yes [provider]  metFORMIN (GLUCOPHAGE) 500 MG tablet Take 500 mg by mouth 2 (two) times daily.    Yes [provider]  metoprolol succinate (TOPROL-XL) 25 MG 24 hr tablet Take 25 mg by mouth daily.   Yes [provider]  nitroGLYCERIN (NITROSTAT) 0.4 MG SL tablet Place 0.4 mg under the tongue every 5 (five) minutes as needed for chest pain.   Yes [provider]  pantoprazole (PROTONIX) 40 MG tablet Take 40 mg by mouth daily.  08/26/15  Yes [provider]  Polyethylene Glycol 3350 (MIRALAX PO) Take 17 g by mouth daily.   Yes [provider]  ranolazine (RANEXA) 500 MG 12 hr tablet Take 500 mg by mouth 2 (two) times daily.    Yes [provider]  tamsulosin (FLOMAX) 0.4 MG CAPS capsule Take 1 capsule (0.4 mg total) by mouth daily after supper. 06/21/17  Yes McGowan, [name], PA-C  Tiotropium Bromide Monohydrate (SPIRIVA RESPIMAT) 2.5 MCG/ACT AERS Inhale 2 puffs into the lungs See admin instructions. Inhale 2 puffs 1 to 2 times a day.   Yes [provider]  OXYGEN Place 2 L into the nose Nightly.     [provider]  VENTOLIN HFA 108 (90 BASE) MCG/ACT inhaler Inhale 1-2 puffs into the lungs as needed for wheezing or shortness of breath. Reported on 12/16/2015 06/24/15   [provider]    Allergies Patient has no known allergies.  Family History  Problem Relation Age of Onset  . Hypertension Father   . CVA Father   . Anemia Neg Hx   . Arrhythmia Neg Hx   . Asthma Neg Hx   . Clotting disorder Neg Hx   . Fainting Neg Hx   . Heart attack Neg Hx   . Heart disease Neg Hx   . Heart failure Neg Hx   . Hyperlipidemia Neg Hx   . Prostate cancer Neg Hx   . Bladder Cancer Neg Hx   . Kidney cancer Neg Hx     Social History Social History   Tobacco Use  . Smoking status: Former Smoker  Packs/day: 3.00    Years: 30.00    Pack years: 90.00    Types: Cigarettes    Last attempt to quit: 04/16/1978    Years since quitting: 39.6  . Smokeless tobacco: Never Used  . Tobacco comment: Quit smoking 1979  Substance Use Topics  . Alcohol use: No    Alcohol/week: 0.0 oz  . Drug use: No    Review of Systems  Constitutional: No fever. Eyes: No redness. ENT: No sore throat. Cardiovascular: Positive for resolving chest pain. Respiratory: Denies shortness of breath. Gastrointestinal: No nausea, no vomiting.   Genitourinary: Negative for flank pain.  Musculoskeletal: Negative for back pain. Skin: Negative for rash. Neurological: Negative for headache.   ____________________________________________   PHYSICAL EXAM:  VITAL SIGNS: ED Triage Vitals  Enc Vitals Group     BP --      Pulse Rate 12/04/17 2050 60     Resp 12/04/17 2050 19     Temp 12/04/17 2050 (!) 97.4 F (36.3 C)     Temp Source 12/04/17 2050 Oral     SpO2 12/04/17 2040 97 %     Weight --      Height --      Head Circumference --      Peak Flow --      Pain Score 12/04/17 2050 3     Pain Loc --      Pain Edu? --      Excl. in [place]? --     Constitutional: Alert and oriented. Well appearing  for age and in no acute distress. Eyes: Conjunctivae are normal.  Head: Atraumatic. Nose: No congestion/rhinnorhea. Mouth/Throat: Mucous membranes are slightly dry.   Neck: Normal range of motion.  Cardiovascular: Normal rate, regular rhythm. Grossly normal heart sounds.  Good peripheral circulation.  Chest wall nontender. Respiratory: Normal respiratory effort.  No retractions.  Decreased breath sounds bilaterally but lungs CTAB. Gastrointestinal: Soft and nontender. No distention.  Genitourinary: No flank tenderness. Musculoskeletal: No lower extremity edema.  Extremities warm and well perfused.  Neurologic:  Normal speech and language. No gross focal neurologic deficits are appreciated.  Skin:  Skin is warm and dry. No rash noted. Psychiatric: Mood and affect are normal. Speech and behavior are normal.  ____________________________________________   LABS (all labs ordered are listed, but only abnormal results are displayed)  Labs Reviewed  BASIC METABOLIC PANEL - Abnormal; Notable for the following components:      Result Value   Glucose, Bld 170 (*)    BUN 22 (*)    Creatinine, Ser 1.27 (*)    GFR calc non Af Amer 49 (*)    GFR calc Af Amer 56 (*)    All other components within normal limits  CBC - Abnormal; Notable for the following components:   RBC 2.39 (*)    Hemoglobin 7.7 (*)    HCT 22.2 (*)    Platelets 137 (*)    All other components within normal limits  TROPONIN I  TROPONIN I  CBC WITH DIFFERENTIAL/PLATELET   ____________________________________________  EKG  ED ECG REPORT I, Arta Silence, the attending physician, personally viewed and interpreted this ECG.  Date: 12/04/2017 EKG Time: 2047 Rate: 59 Rhythm: normal sinus rhythm QRS Axis: normal Intervals: Short PR, RBBB, LPFB ST/T Wave abnormalities: normal Narrative Interpretation: no evidence of acute ischemia; no significant change when compared to EKG of  09/21/2016  ____________________________________________  RADIOLOGY  CXR: Mild cardiomegaly and tortuous aorta but no acute findings  ____________________________________________  PROCEDURES  Procedure(s) performed: No    Critical Care performed: No ____________________________________________   INITIAL IMPRESSION / ASSESSMENT AND PLAN / ED COURSE  Pertinent labs & imaging results that were available during my care of the patient were reviewed by me and considered in my medical decision making (see chart for details).  84 year old male with past medical history of COPD, CHF, and other PMH as noted above presents with relatively atypical chest pain occurring this afternoon and then waxing and waning over the next several hours.  Is now mostly resolved.  He denies significant associated symptoms of weakness, lightheadedness, or shortness of breath.    Past medical records reviewed in Epic; the patient was most recently admitted approximately 1 year ago for cholecystectomy.  On exam, the patient is relatively comfortable appearing, he is hypertensive but the other vital signs are normal, and the remainder the exam is unremarkable.  Differential includes most likely benign cause such as musculoskeletal pain, GERD, bronchitis or other benign cause.  I have a low suspicion for ACS given his unremarkable EKG.  There is no clinical evidence to suggest PE, or aortic dissection or other acute vascular cause especially given that the pain has mostly resolved.  Plan: Chest x-ray, cardiac enzymes x2, and reassess.  Anticipate likely discharge home if negative workup and patient continues to have improving symptoms.  ----------------------------------------- 11:30 PM on 12/04/2017 -----------------------------------------  Initial troponin is negative.  Lab workup also reveals anemia.  Patient's hemoglobin is 7.7, down from the tens 1 year ago.  However on further review of his past medical  records in epic, patient had prior anemia in this range due to suspected blood loss from a GI source in late 2016.  He states he is on iron chronically and his stools are always dark but he denies any acute bleeding or other GI symptoms, and he also denies any new or worsening shortness of breath, lightheadedness, or significant fatigue.  Given the patient is essentially asymptomatic and has normal vital signs, and the anemia is somewhat chronic, based on her restrictive strategy but there is no indication for emergent transfusion.  I discussed this with the patient and family.  We agreed on a plan to repeat the CBC when we repeat the troponin after 4 hours, and if there is an acute drop we will admit the patient for possible transfusion.  If the hemoglobin is stable, the patient would prefer to be discharged and follow-up with his regular doctor.  I am signing the patient out to the oncoming physician Dr. Beather Arbour pending repeat troponin and CBC.   ____________________________________________   FINAL CLINICAL IMPRESSION(S) / ED DIAGNOSES  Final diagnoses:  Atypical chest pain  Anemia, unspecified type      NEW MEDICATIONS STARTED DURING THIS VISIT:  This SmartLink is deprecated. Use AVSMEDLIST instead to display the medication list for a patient.   Note:  This document was prepared using Dragon voice recognition software and may include unintentional dictation errors.    Arta Silence, MD 12/04/17 2355

## 2017-12-04 NOTE — ED Notes (Signed)
ED Provider at bedside. 

## 2017-12-04 NOTE — ED Notes (Signed)
Pt eating dinner tray °

## 2017-12-04 NOTE — ED Triage Notes (Addendum)
EMS pt from home with c/o chest pain to right center chest; tightness; denies cough/cold symptoms; history of GERD-ate a big meal tonight but does not feel this is the problem tonight; pt took, his own aspirin, 324mg  and 3 Nitro tonight, last dose about 1930

## 2017-12-04 NOTE — ED Notes (Signed)
Pt brought in via ems from home.  Pt has chest pain tonight.denies sob.  Pt took 3 ntg with pain relief.  No chest pain on arrival.  Sinus on monitor with pac's.  No n/v/d.  No diaphoresis.  Skin warm and dry.  Iv in place.  Pt alert.  Family with pt.

## 2017-12-05 ENCOUNTER — Encounter: Payer: Self-pay | Admitting: Internal Medicine

## 2017-12-05 ENCOUNTER — Observation Stay
Admit: 2017-12-05 | Discharge: 2017-12-05 | Disposition: A | Discharge status: Home / Self Care | Payer: Medicare Other | Attending: Internal Medicine | Admitting: Internal Medicine

## 2017-12-05 ENCOUNTER — Other Ambulatory Visit: Payer: Self-pay

## 2017-12-05 DIAGNOSIS — R079 Chest pain, unspecified: Secondary | ICD-10-CM | POA: Diagnosis present

## 2017-12-05 LAB — CBC WITH DIFFERENTIAL/PLATELET
BASOS ABS: 0 10*3/uL (ref 0–0.1)
BASOS PCT: 1 %
EOS PCT: 3 %
Eosinophils Absolute: 0.2 10*3/uL (ref 0–0.7)
HCT: 20.1 % — ABNORMAL LOW (ref 40.0–52.0)
Hemoglobin: 7 g/dL — ABNORMAL LOW (ref 13.0–18.0)
Lymphocytes Relative: 19 %
Lymphs Abs: 1 10*3/uL (ref 1.0–3.6)
MCH: 31.9 pg (ref 26.0–34.0)
MCHC: 34.7 g/dL (ref 32.0–36.0)
MCV: 92.1 fL (ref 80.0–100.0)
Monocytes Absolute: 0.4 10*3/uL (ref 0.2–1.0)
Monocytes Relative: 8 %
Neutro Abs: 3.6 10*3/uL (ref 1.4–6.5)
Neutrophils Relative %: 69 %
PLATELETS: 126 10*3/uL — AB (ref 150–440)
RBC: 2.18 MIL/uL — AB (ref 4.40–5.90)
RDW: 13.3 % (ref 11.5–14.5)
WBC: 5.2 10*3/uL (ref 3.8–10.6)

## 2017-12-05 LAB — BASIC METABOLIC PANEL
Anion gap: 6 (ref 5–15)
BUN: 19 mg/dL (ref 6–20)
CO2: 25 mmol/L (ref 22–32)
CREATININE: 1.01 mg/dL (ref 0.61–1.24)
Calcium: 9.4 mg/dL (ref 8.9–10.3)
Chloride: 106 mmol/L (ref 101–111)
GFR calc Af Amer: >60 mL/min (ref >60)
GLUCOSE: 153 mg/dL — AB (ref 65–99)
POTASSIUM: 3.7 mmol/L (ref 3.5–5.1)
Sodium: 137 mmol/L (ref 135–145)

## 2017-12-05 LAB — CBC
HCT: 33.6 % — ABNORMAL LOW (ref 40.0–52.0)
HCT: 31.9 % — ABNORMAL LOW (ref 40.0–52.0)
HEMOGLOBIN: 11.6 g/dL — AB (ref 13.0–18.0)
Hemoglobin: 11 g/dL — ABNORMAL LOW (ref 13.0–18.0)
MCH: 31.9 pg (ref 26.0–34.0)
MCH: 31.4 pg (ref 26.0–34.0)
MCHC: 34.6 g/dL (ref 32.0–36.0)
MCHC: 34.6 g/dL (ref 32.0–36.0)
MCV: 92.1 fL (ref 80.0–100.0)
MCV: 90.7 fL (ref 80.0–100.0)
PLATELETS: 185 10*3/uL (ref 150–440)
PLATELETS: 202 10*3/uL (ref 150–440)
RBC: 3.65 MIL/uL — AB (ref 4.40–5.90)
RBC: 3.52 MIL/uL — ABNORMAL LOW (ref 4.40–5.90)
RDW: 13.6 % (ref 11.5–14.5)
RDW: 13.5 % (ref 11.5–14.5)
WBC: 7.5 10*3/uL (ref 3.8–10.6)
WBC: 6.8 10*3/uL (ref 3.8–10.6)

## 2017-12-05 LAB — LIPID PANEL
CHOL/HDL RATIO: 2.7 ratio
Cholesterol: 102 mg/dL (ref 0–200)
HDL: 38 mg/dL — ABNORMAL LOW (ref >40)
LDL Cholesterol: 45 mg/dL (ref 0–99)
Triglycerides: 94 mg/dL (ref <150)
VLDL: 19 mg/dL (ref 0–40)

## 2017-12-05 LAB — TROPONIN I

## 2017-12-05 LAB — VITAMIN B12: VITAMIN B 12: 299 pg/mL (ref 180–914)

## 2017-12-05 LAB — IRON AND TIBC
Iron: 25 ug/dL — ABNORMAL LOW (ref 45–182)
SATURATION RATIOS: 10 % — AB (ref 17.9–39.5)
TIBC: 260 ug/dL (ref 250–450)
UIBC: 235 ug/dL

## 2017-12-05 LAB — GLUCOSE, CAPILLARY
GLUCOSE-CAPILLARY: 126 mg/dL — AB (ref 65–99)
GLUCOSE-CAPILLARY: 129 mg/dL — AB (ref 65–99)
GLUCOSE-CAPILLARY: 159 mg/dL — AB (ref 65–99)
Glucose-Capillary: 162 mg/dL — ABNORMAL HIGH (ref 65–99)

## 2017-12-05 LAB — FOLATE: Folate: 12.1 ng/mL (ref >5.9)

## 2017-12-05 LAB — FERRITIN: FERRITIN: 119 ng/mL (ref 24–336)

## 2017-12-05 MED ORDER — ASPIRIN EC 81 MG PO TBEC: 81.0000 mg | DELAYED_RELEASE_TABLET | Freq: Every day | ORAL | Status: DC

## 2017-12-05 MED ORDER — PANTOPRAZOLE SODIUM 40 MG PO TBEC
40.0000 mg | DELAYED_RELEASE_TABLET | Freq: Every day | ORAL | Status: DC
  Administered 2017-12-05: 40 mg via ORAL
  Filled 2017-12-05: qty 1

## 2017-12-05 MED ORDER — LISINOPRIL 5 MG PO TABS
5.0000 mg | ORAL_TABLET | Freq: Every day | ORAL | Status: DC
  Administered 2017-12-05: 5 mg via ORAL
  Filled 2017-12-05: qty 1

## 2017-12-05 MED ORDER — SODIUM CHLORIDE 0.9 % IV SOLN: 250.0000 mL | INTRAVENOUS | Status: DC | PRN

## 2017-12-05 MED ORDER — NITROGLYCERIN 0.4 MG SL SUBL
0.4000 mg | SUBLINGUAL_TABLET | SUBLINGUAL | Status: DC | PRN
Start: 2017-12-05 — End: 2017-12-05

## 2017-12-05 MED ORDER — ALBUTEROL SULFATE (2.5 MG/3ML) 0.083% IN NEBU: 3.0000 mL | INHALATION_SOLUTION | RESPIRATORY_TRACT | Status: DC | PRN

## 2017-12-05 MED ORDER — ENOXAPARIN SODIUM 40 MG/0.4ML ~~LOC~~ SOLN: 40.0000 mg | SUBCUTANEOUS | Status: DC

## 2017-12-05 MED ORDER — RANOLAZINE ER 500 MG PO TB12
500.0000 mg | ORAL_TABLET | Freq: Two times a day (BID) | ORAL | Status: DC
  Administered 2017-12-05: 500 mg via ORAL
  Filled 2017-12-05 (×3): qty 1

## 2017-12-05 MED ORDER — TIOTROPIUM BROMIDE MONOHYDRATE 2.5 MCG/ACT IN AERS: 2.0000 | INHALATION_SPRAY | RESPIRATORY_TRACT | Status: DC

## 2017-12-05 MED ORDER — FERROUS SULFATE 325 (65 FE) MG PO TABS
325.0000 mg | ORAL_TABLET | Freq: Two times a day (BID) | ORAL | Status: DC
  Administered 2017-12-05: 325 mg via ORAL
  Filled 2017-12-05: qty 1

## 2017-12-05 MED ORDER — FLUOXETINE HCL 20 MG PO CAPS
40.0000 mg | ORAL_CAPSULE | Freq: Every day | ORAL | Status: DC
  Administered 2017-12-05: 40 mg via ORAL
  Filled 2017-12-05: qty 2

## 2017-12-05 MED ORDER — ASPIRIN 81 MG PO CHEW: 324.0000 mg | CHEWABLE_TABLET | ORAL | Status: DC

## 2017-12-05 MED ORDER — LISINOPRIL 5 MG PO TABS: 5.0000 mg | ORAL_TABLET | Freq: Every day | ORAL | 0 refills | Status: DC

## 2017-12-05 MED ORDER — HYDRALAZINE HCL 20 MG/ML IJ SOLN
10.0000 mg | Freq: Once | INTRAMUSCULAR | Status: AC
  Administered 2017-12-05: 10 mg via INTRAVENOUS

## 2017-12-05 MED ORDER — ASPIRIN 300 MG RE SUPP: 300.0000 mg | RECTAL | Status: DC

## 2017-12-05 MED ORDER — POLYETHYLENE GLYCOL 3350 17 G PO PACK
17.0000 g | PACK | Freq: Every day | ORAL | Status: DC
  Administered 2017-12-05: 17 g via ORAL
  Filled 2017-12-05: qty 1

## 2017-12-05 MED ORDER — METOPROLOL SUCCINATE ER 50 MG PO TB24
25.0000 mg | ORAL_TABLET | Freq: Every day | ORAL | Status: DC
  Administered 2017-12-05: 25 mg via ORAL
  Filled 2017-12-05: qty 1

## 2017-12-05 MED ORDER — ISOSORBIDE MONONITRATE ER 60 MG PO TB24
30.0000 mg | ORAL_TABLET | Freq: Every day | ORAL | Status: DC
  Administered 2017-12-05: 30 mg via ORAL
  Filled 2017-12-05: qty 1

## 2017-12-05 MED ORDER — SODIUM CHLORIDE 0.9 % IV SOLN
10.0000 mL/h | Freq: Once | INTRAVENOUS | Status: AC
  Administered 2017-12-05: 10 mL/h via INTRAVENOUS

## 2017-12-05 MED ORDER — CLOPIDOGREL BISULFATE 75 MG PO TABS
75.0000 mg | ORAL_TABLET | Freq: Every day | ORAL | Status: DC
  Administered 2017-12-05: 75 mg via ORAL
  Filled 2017-12-05: qty 1

## 2017-12-05 MED ORDER — SODIUM CHLORIDE 0.9% FLUSH: 3.0000 mL | INTRAVENOUS | Status: DC | PRN

## 2017-12-05 MED ORDER — INSULIN ASPART 100 UNIT/ML ~~LOC~~ SOLN
0.0000 [IU] | Freq: Three times a day (TID) | SUBCUTANEOUS | Status: DC
  Administered 2017-12-05: 3 [IU] via SUBCUTANEOUS
  Administered 2017-12-05: 2 [IU] via SUBCUTANEOUS
  Administered 2017-12-05: 3 [IU] via SUBCUTANEOUS
  Filled 2017-12-05 (×3): qty 1

## 2017-12-05 MED ORDER — SODIUM CHLORIDE 0.9% FLUSH
3.0000 mL | Freq: Two times a day (BID) | INTRAVENOUS | Status: DC
  Administered 2017-12-05: 3 mL via INTRAVENOUS

## 2017-12-05 MED ORDER — TIOTROPIUM BROMIDE MONOHYDRATE 18 MCG IN CAPS
18.0000 ug | ORAL_CAPSULE | Freq: Every day | RESPIRATORY_TRACT | Status: DC
  Administered 2017-12-05: 18 ug via RESPIRATORY_TRACT
  Filled 2017-12-05: qty 5

## 2017-12-05 MED ORDER — TAMSULOSIN HCL 0.4 MG PO CAPS
0.4000 mg | ORAL_CAPSULE | Freq: Every day | ORAL | Status: DC
  Administered 2017-12-05: 0.4 mg via ORAL
  Filled 2017-12-05: qty 1

## 2017-12-05 MED ORDER — HYDRALAZINE HCL 20 MG/ML IJ SOLN
INTRAMUSCULAR | Status: AC
  Filled 2017-12-05: qty 1

## 2017-12-05 MED ORDER — ONDANSETRON HCL 4 MG/2ML IJ SOLN: 4.0000 mg | Freq: Four times a day (QID) | INTRAMUSCULAR | Status: DC | PRN

## 2017-12-05 MED ORDER — INSULIN ASPART 100 UNIT/ML ~~LOC~~ SOLN: 0.0000 [IU] | Freq: Every day | SUBCUTANEOUS | Status: DC

## 2017-12-05 MED ORDER — ACETAMINOPHEN 325 MG PO TABS: 650.0000 mg | ORAL_TABLET | ORAL | Status: DC | PRN

## 2017-12-05 MED ORDER — FUROSEMIDE 40 MG PO TABS
40.0000 mg | ORAL_TABLET | Freq: Every day | ORAL | Status: DC
  Administered 2017-12-05: 40 mg via ORAL
  Filled 2017-12-05: qty 1

## 2017-12-05 MED ORDER — NITROGLYCERIN 0.4 MG SL SUBL: 0.4000 mg | SUBLINGUAL_TABLET | SUBLINGUAL | Status: DC | PRN

## 2017-12-05 MED ORDER — PRAVASTATIN SODIUM 20 MG PO TABS
20.0000 mg | ORAL_TABLET | Freq: Every day | ORAL | Status: DC
  Administered 2017-12-05: 20 mg via ORAL
  Filled 2017-12-05: qty 1

## 2017-12-05 NOTE — ED Notes (Signed)
Pt transported to room 235 

## 2017-12-05 NOTE — Care Management Note (Addendum)
Case Management Note  Patient Details  Name: Justin Leonard MRN: [id number] Date of Birth: 1927/01/01  Subjective/Objective:                 Presents from home with chest pain.  Placed in observation.  Hemoglobin as low as 7 and order for blood transfusion. Has unusual antibodies in blood.  Hemoglobin this morning up to 11 without transfusion.  This is being repeated. Oral iron at home. Troponins negative. Cardiology and gi to consult   Action/Plan: If repeat hemoglobin is acceptable results, patient will discharge home and gi consult will be cancelled   Expected Discharge Date:                  Expected Discharge Plan:     In-House Referral:     Discharge planning Services     Post Acute Care Choice:    Choice offered to:     DME Arranged:    DME Agency:     HH Arranged:    [place] Agency:     Status of Service:     If discussed at [employer] of Stay Meetings, dates discussed:    Additional Comments:  Katrina Stack, RN 12/05/2017, 8:31 AM

## 2017-12-05 NOTE — Progress Notes (Signed)
*  PRELIMINARY RESULTS* Echocardiogram 2D Echocardiogram has been performed.  Sherrie Sport 12/05/2017, 1:34 PM

## 2017-12-05 NOTE — Consult Note (Signed)
Justin Leonard is a 84 y.o. male  [id number]  Primary Cardiologist: Dr. [name] Reason for Consultation: Chest pain, low hemoglobin  HPI: Justin Leonard is an 84yo male with past medical history of extensive CAD with history of CABGx3, melena, CHF, COPD, GERD, HLP, and HTN presented to the ER yesterday with sharp chest pain lasting a few hours and not responding to his typical Tums or nitrostat doses. EKG showed no acute changes, troponin negative x 3. His chest pain has since resolved but was noted to have anemia and was admitted for evaluation and consultation by cardiology and GI.  Review of Systems: No chest pain, no shortness of breath.    Past Medical History:  Diagnosis Date  . Anemia   . Arthritis   . Back pain   . CHF (congestive heart failure) ([place])   . COPD (chronic obstructive pulmonary disease) ([place])   . Coronary artery disease   . Deafness   . Depression   . Diabetes mellitus without complication ([place])   . Dizziness    when gets up  . GERD (gastroesophageal reflux disease)   . Hyperlipidemia   . Hypertension   . Melena   . Myocardial infarction ([place])    maybe a small one  . Obesity   . Oxygen decrease    WEARS HS,PRN  . Peripheral neuropathy   . Peripheral neuropathy    bil.hands and feet  . Skin cancer   . Umbilical hernia     Medications Prior to Admission  Medication Sig Dispense Refill  . acetaminophen (TYLENOL) 650 MG CR tablet Take 650 mg by mouth 2 (two) times daily as needed for pain.     Marland Kitchen aspirin EC 81 MG tablet Take 81 mg by mouth daily.    . clopidogrel (PLAVIX) 75 MG tablet Take 1 tablet (75 mg total) by mouth daily. 30 tablet 0  . ferrous sulfate 325 (65 FE) MG tablet Take 325 mg by mouth 2 (two) times daily.     Marland Kitchen FLUoxetine (PROZAC) 40 MG capsule Take 40mg  by mouth every morning    . furosemide (LASIX) 40 MG tablet Take 1 tablet (40 mg total) by mouth daily. 10 tablet 0  . HYDROcodone-acetaminophen (NORCO/VICODIN) 5-325 MG tablet      . insulin glargine (LANTUS) 100 UNIT/ML injection Inject 15-18 Units into the skin at bedtime.     . isosorbide mononitrate (IMDUR) 30 MG 24 hr tablet Take 1 tablet (30 mg total) by mouth daily. 30 tablet 0  . lovastatin (MEVACOR) 20 MG tablet Take 20 mg by mouth at bedtime.    . metFORMIN (GLUCOPHAGE) 500 MG tablet Take 500 mg by mouth 2 (two) times daily.     . metoprolol succinate (TOPROL-XL) 25 MG 24 hr tablet Take 25 mg by mouth daily.    . nitroGLYCERIN (NITROSTAT) 0.4 MG SL tablet Place 0.4 mg under the tongue every 5 (five) minutes as needed for chest pain.    . pantoprazole (PROTONIX) 40 MG tablet Take 40 mg by mouth daily.     . Polyethylene Glycol 3350 (MIRALAX PO) Take 17 g by mouth daily.    . ranolazine (RANEXA) 500 MG 12 hr tablet Take 500 mg by mouth 2 (two) times daily.     . tamsulosin (FLOMAX) 0.4 MG CAPS capsule Take 1 capsule (0.4 mg total) by mouth daily after supper. 30 capsule 12  . Tiotropium Bromide Monohydrate (SPIRIVA RESPIMAT) 2.5 MCG/ACT AERS Inhale 2 puffs into the lungs See  admin instructions. Inhale 2 puffs 1 to 2 times a day.    . OXYGEN Place 2 L into the nose Nightly.    . VENTOLIN HFA 108 (90 BASE) MCG/ACT inhaler Inhale 1-2 puffs into the lungs as needed for wheezing or shortness of breath. Reported on 12/16/2015       . aspirin  324 mg Oral NOW   Or  . aspirin  300 mg Rectal NOW  . [START ON 12/06/2017] aspirin EC  81 mg Oral Daily  . clopidogrel  75 mg Oral Daily  . enoxaparin (LOVENOX) injection  40 mg Subcutaneous Q24H  . ferrous sulfate  325 mg Oral BID  . FLUoxetine  40 mg Oral Daily  . furosemide  40 mg Oral Daily  . insulin aspart  0-15 Units Subcutaneous TID WC  . insulin aspart  0-5 Units Subcutaneous QHS  . isosorbide mononitrate  30 mg Oral Daily  . metoprolol succinate  25 mg Oral Daily  . pantoprazole  40 mg Oral Daily  . polyethylene glycol  17 g Oral Daily  . pravastatin  20 mg Oral q1800  . ranolazine  500 mg Oral BID  . sodium  chloride flush  3 mL Intravenous Q12H  . tamsulosin  0.4 mg Oral QPC supper  . tiotropium  18 mcg Inhalation Daily    Infusions: . sodium chloride      No Known Allergies  Social History   Socioeconomic History  . Marital status: Married    Spouse name: Not on file  . Number of children: Not on file  . Years of education: Not on file  . Highest education level: Not on file  Social Needs  . Financial resource strain: Not on file  . Food insecurity - worry: Not on file  . Food insecurity - inability: Not on file  . Transportation needs - medical: Not on file  . Transportation needs - non-medical: Not on file  Occupational History  . Not on file  Tobacco Use  . Smoking status: Former Smoker    Packs/day: 3.00    Years: 30.00    Pack years: 90.00    Types: Cigarettes    Last attempt to quit: 04/16/1978    Years since quitting: 39.6  . Smokeless tobacco: Never Used  . Tobacco comment: Quit smoking 1979  Substance and Sexual Activity  . Alcohol use: No    Alcohol/week: 0.0 oz  . Drug use: No  . Sexual activity: Not Currently  Other Topics Concern  . Not on file  Social History Narrative  . Not on file    Family History  Problem Relation Age of Onset  . Hypertension Father   . CVA Father   . Anemia Neg Hx   . Arrhythmia Neg Hx   . Asthma Neg Hx   . Clotting disorder Neg Hx   . Fainting Neg Hx   . Heart attack Neg Hx   . Heart disease Neg Hx   . Heart failure Neg Hx   . Hyperlipidemia Neg Hx   . Prostate cancer Neg Hx   . Bladder Cancer Neg Hx   . Kidney cancer Neg Hx     PHYSICAL EXAM: Vitals:   12/05/17 0259 12/05/17 0733  BP: (!) 189/69 (!) 178/64  Pulse: 66 65  Resp: 18 18  Temp: 98 F (36.7 C) 98.1 F (36.7 C)  SpO2: 97% 94%     Intake/Output Summary (Last 24 hours) at 12/05/2017 9937 Last data filed  at 12/05/2017 0833 Gross per 24 hour  Intake 3 ml  Output 825 ml  Net -822 ml    General:  Well appearing. No respiratory difficulty HEENT:  normal Neck: supple. no JVD. Carotids 2+ bilat; no bruits. No lymphadenopathy or thryomegaly appreciated. Cor: PMI nondisplaced. Regular rate & rhythm. No rubs, gallops or murmurs. Lungs: clear Abdomen: soft, nontender, nondistended. No hepatosplenomegaly. No bruits or masses. Good bowel sounds. Extremities: no cyanosis, clubbing, rash, edema Neuro: alert & oriented x 3, cranial nerves grossly intact. moves all 4 extremities w/o difficulty. Affect pleasant.  ECG: Sinus rhythm 66bpm  Results for orders placed or performed during the hospital encounter of 12/04/17 (from the past 24 hour(s))  Basic metabolic panel     Status: Abnormal   Collection Time: 12/04/17  8:47 PM  Result Value Ref Range   Sodium 139 135 - 145 mmol/L   Potassium 4.0 3.5 - 5.1 mmol/L   Chloride 104 101 - 111 mmol/L   CO2 28 22 - 32 mmol/L   Glucose, Bld 170 (H) 65 - 99 mg/dL   BUN 22 (H) 6 - 20 mg/dL   Creatinine, Ser 1.27 (H) 0.61 - 1.24 mg/dL   Calcium 9.5 8.9 - 10.3 mg/dL   GFR calc non Af Amer 49 (L) >60 mL/min   GFR calc Af Amer 56 (L) >60 mL/min   Anion gap 7 5 - 15  CBC     Status: Abnormal   Collection Time: 12/04/17  8:47 PM  Result Value Ref Range   WBC 5.0 3.8 - 10.6 K/uL   RBC 2.39 (L) 4.40 - 5.90 MIL/uL   Hemoglobin 7.7 (L) 13.0 - 18.0 g/dL   HCT 22.2 (L) 40.0 - 52.0 %   MCV 92.9 80.0 - 100.0 fL   MCH 32.2 26.0 - 34.0 pg   MCHC 34.6 32.0 - 36.0 g/dL   RDW 13.2 11.5 - 14.5 %   Platelets 137 (L) 150 - 440 K/uL  Troponin I     Status: None   Collection Time: 12/04/17  8:47 PM  Result Value Ref Range   Troponin I <0.03 <0.03 ng/mL  Troponin I     Status: None   Collection Time: 12/05/17 12:19 AM  Result Value Ref Range   Troponin I <0.03 <0.03 ng/mL  CBC with Differential     Status: Abnormal   Collection Time: 12/05/17 12:19 AM  Result Value Ref Range   WBC 5.2 3.8 - 10.6 K/uL   RBC 2.18 (L) 4.40 - 5.90 MIL/uL   Hemoglobin 7.0 (L) 13.0 - 18.0 g/dL   HCT 20.1 (L) 40.0 - 52.0 %   MCV  92.1 80.0 - 100.0 fL   MCH 31.9 26.0 - 34.0 pg   MCHC 34.7 32.0 - 36.0 g/dL   RDW 13.3 11.5 - 14.5 %   Platelets 126 (L) 150 - 440 K/uL   Neutrophils Relative % 69 %   Neutro Abs 3.6 1.4 - 6.5 K/uL   Lymphocytes Relative 19 %   Lymphs Abs 1.0 1.0 - 3.6 K/uL   Monocytes Relative 8 %   Monocytes Absolute 0.4 0.2 - 1.0 K/uL   Eosinophils Relative 3 %   Eosinophils Absolute 0.2 0 - 0.7 K/uL   Basophils Relative 1 %   Basophils Absolute 0.0 0 - 0.1 K/uL  Type and screen [facility]     Status: None   Collection Time: 12/05/17  1:19 AM  Result Value Ref Range   ABO/RH(D)  O POS    Antibody Screen POS    Sample Expiration 12/08/2017    Antibody Identification      ANTI E Performed at [facility], [address] [place], [place] [address]   Prepare RBC     Status: None (Preliminary result)   Collection Time: 12/05/17  1:19 AM  Result Value Ref Range   Order Confirmation PENDING   Glucose, capillary     Status: Abnormal   Collection Time: 12/05/17  3:26 AM  Result Value Ref Range   Glucose-Capillary 126 (H) 65 - 99 mg/dL  Troponin I     Status: None   Collection Time: 12/05/17  5:12 AM  Result Value Ref Range   Troponin I <0.03 <0.03 ng/mL  Basic metabolic panel     Status: Abnormal   Collection Time: 12/05/17  5:12 AM  Result Value Ref Range   Sodium 137 135 - 145 mmol/L   Potassium 3.7 3.5 - 5.1 mmol/L   Chloride 106 101 - 111 mmol/L   CO2 25 22 - 32 mmol/L   Glucose, Bld 153 (H) 65 - 99 mg/dL   BUN 19 6 - 20 mg/dL   Creatinine, Ser 1.01 0.61 - 1.24 mg/dL   Calcium 9.4 8.9 - 10.3 mg/dL   GFR calc non Af Amer >60 >60 mL/min   GFR calc Af Amer >60 >60 mL/min   Anion gap 6 5 - 15  Lipid panel     Status: Abnormal   Collection Time: 12/05/17  5:12 AM  Result Value Ref Range   Cholesterol 102 0 - 200 mg/dL   Triglycerides 94 <150 mg/dL   HDL 38 (L) >40 mg/dL   Total CHOL/HDL Ratio 2.7 RATIO   VLDL 19 0 - 40 mg/dL   LDL Cholesterol 45 0 -  99 mg/dL  CBC     Status: Abnormal   Collection Time: 12/05/17  5:12 AM  Result Value Ref Range   WBC 7.5 3.8 - 10.6 K/uL   RBC 3.65 (L) 4.40 - 5.90 MIL/uL   Hemoglobin 11.6 (L) 13.0 - 18.0 g/dL   HCT 33.6 (L) 40.0 - 52.0 %   MCV 92.1 80.0 - 100.0 fL   MCH 31.9 26.0 - 34.0 pg   MCHC 34.6 32.0 - 36.0 g/dL   RDW 13.6 11.5 - 14.5 %   Platelets 185 150 - 440 K/uL  Glucose, capillary     Status: Abnormal   Collection Time: 12/05/17  7:35 AM  Result Value Ref Range   Glucose-Capillary 129 (H) 65 - 99 mg/dL   Dg Chest 2 View  Result Date: 12/04/2017 CLINICAL DATA:  Chest pain tonight. EXAM: CHEST  2 VIEW COMPARISON:  Radiograph 09/21/2016 FINDINGS: Mild cardiomegaly with tortuous atherosclerotic thoracic aorta. Post median sternotomy. No pulmonary edema. Minimal subsegmental atelectasis at the left lung base. No confluent consolidation, pleural effusion or pneumothorax. No acute osseous abnormalities are seen. IMPRESSION: Mild cardiomegaly and tortuous atherosclerotic aorta. No evidence of congestive failure. Electronically Signed   By: Jeb Levering M.D.   On: 12/04/2017 22:39     ASSESSMENT AND PLAN:  Atypical chest pain: Pain has resolved. Pt is compliant with optimal medical therapy, has been stable on Plavix, aspirin, metoprolol, statin with well controlled LDLs, isosorbide, Ranexa. Pt typically has GERD symptoms, troponin negative x3 with no acute changes on EKG. Advise continuing medical therapy. If anemia proves to be accurate (see below) may consider stopping Plavix. If echo and anemia show no acute findings,  recommend discharge today with outpatient follow up, Thursday 10am with Dr. Humphrey Rolls.  Anemia: Hemoglobin results came back at 11.6 this morning up from 7.0 yesterday. Advise repeat H&H to evaluate for accuracy.   HTN: Systolic blood pressure remains elevated, diastolic WNL. Creatinine WNL, advise adding losartan 50mg  daily.   Echo: Pending.     Jake Bathe, NP-C Cell:  [phone]

## 2017-12-05 NOTE — Plan of Care (Signed)
  Adequate for Discharge Education: Knowledge of General Education information will improve 12/05/2017 1734 - Adequate for Discharge by Feliberto Gottron, [place] Behavior/Discharge Planning: Ability to manage health-related needs will improve 12/05/2017 1734 - Adequate for Discharge by Feliberto Gottron, RN Clinical Measurements: Ability to maintain clinical measurements within normal limits will improve 12/05/2017 1734 - Adequate for Discharge by Feliberto Gottron, RN Will remain free from infection 12/05/2017 1734 - Adequate for Discharge by Feliberto Gottron, RN Diagnostic test results will improve 12/05/2017 1734 - Adequate for Discharge by Feliberto Gottron, RN Respiratory complications will improve 12/05/2017 1734 - Adequate for Discharge by Feliberto Gottron, RN Cardiovascular complication will be avoided 12/05/2017 1734 - Adequate for Discharge by Feliberto Gottron, RN Coping: Level of anxiety will decrease 12/05/2017 1734 - Adequate for Discharge by Feliberto Gottron, RN Safety: Ability to remain free from injury will improve 12/05/2017 1734 - Adequate for Discharge by Feliberto Gottron, RN

## 2017-12-05 NOTE — Progress Notes (Signed)
Blood bank called to notify the 2 units of blood were ready. Per MD, patient will not need the blood anymore due to repeat hemoglobin being 11.0. Will continue to monitor patient.

## 2017-12-05 NOTE — Progress Notes (Signed)
Patients IV taken out and off tele. Discharge instructions given to patient with family at bedside. Pt and family verbalized understanding with no further questions. Pt transported via wheelchair.

## 2017-12-05 NOTE — Care Management Obs Status (Signed)
[facility] NOTIFICATION   Patient Details  Name: Justin Leonard MRN: [id number] Date of Birth: 1927/03/05   Medicare Observation Status Notification Given:  Yes Patient is not able to maneuver mouse to sign notice.  Notice signed, one given to patient and the other to HIM for scanning    Katrina Stack, RN 12/05/2017, 9:32 AM

## 2017-12-05 NOTE — ED Notes (Signed)
Consent signed by patient for blood transfusion.  Family with pt.  Pt alert.

## 2017-12-05 NOTE — ED Notes (Signed)
primedoc in with pt for admission.  

## 2017-12-05 NOTE — Progress Notes (Signed)
[place] [facility] [place] NAME: Justin Leonard    MR#:  [id number]  DATE OF BIRTH:  1927-10-31  SUBJECTIVE:  CHIEF COMPLAINT:   Chief Complaint  Patient presents with  . Chest Pain     Came with chest pain, history of CHF, COPD, coronary artery disease and diabetes. Also noted to have low hemoglobin level last night in ER but he denied any history of rectal bleeding. In this morning hemoglobin is more than 11 without any blood transfusion. Patient denies having any chest pain now and feels better. Her cardiologist this morning.  REVIEW OF SYSTEMS:  CONSTITUTIONAL: No fever, fatigue or weakness.  EYES: No blurred or double vision.  EARS, NOSE, AND THROAT: No tinnitus or ear pain.  RESPIRATORY: No cough, shortness of breath, wheezing or hemoptysis.  CARDIOVASCULAR: No chest pain, orthopnea, edema.  GASTROINTESTINAL: No nausea, vomiting, diarrhea or abdominal pain.  GENITOURINARY: No dysuria, hematuria.  ENDOCRINE: No polyuria, nocturia,  HEMATOLOGY: No anemia, easy bruising or bleeding SKIN: No rash or lesion. MUSCULOSKELETAL: No joint pain or arthritis.   NEUROLOGIC: No tingling, numbness, weakness.  PSYCHIATRY: No anxiety or depression.   ROS  DRUG ALLERGIES:  No Known Allergies  VITALS:  Blood pressure (!) 135/54, pulse (!) 59, temperature 98.1 F (36.7 C), temperature source Oral, resp. rate 18, height 6\' 3"  (1.905 m), weight 98.2 kg (216 lb 6.4 oz), SpO2 94 %.  PHYSICAL EXAMINATION:  GENERAL:  84 y.o.-year-old patient lying in the bed with no acute distress.  EYES: Pupils equal, round, reactive to light and accommodation. No scleral icterus. Extraocular muscles intact.  HEENT: Head atraumatic, normocephalic. Oropharynx and nasopharynx clear.  NECK:  Supple, no jugular venous distention. No thyroid enlargement, no tenderness.  LUNGS: Normal breath sounds bilaterally, no wheezing, rales,rhonchi or crepitation. No use of accessory  muscles of respiration.  CARDIOVASCULAR: S1, S2 normal. No murmurs, rubs, or gallops.  ABDOMEN: Soft, nontender, nondistended. Bowel sounds present. No organomegaly or mass.  EXTREMITIES: No pedal edema, cyanosis, or clubbing.  NEUROLOGIC: Cranial nerves II through XII are intact. Muscle strength 5/5 in all extremities. Sensation intact. Gait not checked.  PSYCHIATRIC: The patient is alert and oriented x 3.  SKIN: No obvious rash, lesion, or ulcer.   Physical Exam LABORATORY PANEL:   CBC Recent Labs  Lab 12/05/17 1103  WBC 6.8  HGB 11.0*  HCT 31.9*  PLT 202   ------------------------------------------------------------------------------------------------------------------  Chemistries  Recent Labs  Lab 12/05/17 0512  NA 137  K 3.7  CL 106  CO2 25  GLUCOSE 153*  BUN 19  CREATININE 1.01  CALCIUM 9.4   ------------------------------------------------------------------------------------------------------------------  Cardiac Enzymes Recent Labs  Lab 12/05/17 0512 12/05/17 1103  TROPONINI <0.03 <0.03   ------------------------------------------------------------------------------------------------------------------  RADIOLOGY:  Dg Chest 2 View  Result Date: 12/04/2017 CLINICAL DATA:  Chest pain tonight. EXAM: CHEST  2 VIEW COMPARISON:  Radiograph 09/21/2016 FINDINGS: Mild cardiomegaly with tortuous atherosclerotic thoracic aorta. Post median sternotomy. No pulmonary edema. Minimal subsegmental atelectasis at the left lung base. No confluent consolidation, pleural effusion or pneumothorax. No acute osseous abnormalities are seen. IMPRESSION: Mild cardiomegaly and tortuous atherosclerotic aorta. No evidence of congestive failure. Electronically Signed   By: Jeb Levering M.D.   On: 12/04/2017 22:39    ASSESSMENT AND PLAN:   Active Problems:   Chest pain    Chest pain .  Coronary artery disease .  Emphysema .  Congestive heart failure .  Uncontrolled  hypertension .  Hyperlipidemia  Treatment plan Admitted patient to telemetry observation bed- 3 troponins remained negative and patient was stable and pain-free Morning. Advised Transfuse PRBC IV- but before patient a transfusion in next morning patient's hemoglobin was 11.5, rechecked CBC and hemoglobin was still 11 so likely the admission hemoglobin was done on diluted blood. Advise no further workup. Patient denies any history of GI bleed. Monitor hemoglobin hematocrit  Cardiology consultation- appreciated and suggested no change in the medication and follow-up in the office. Echocardiogram done. Control blood pressure- appears to be high on presentation likely secondary to anxiety of being in ER. Patient's wife and son in the room confirm that he was stable on his home blood pressure medication and recheck blood pressure frequently at home and it comes in the range of 726 systolic.    All the records are reviewed and case discussed with Care Management/Social Workerr. Management plans discussed with the patient, family and they are in agreement.  CODE STATUS: full.  TOTAL TIME TAKING CARE OF THIS PATIENT: 35 minutes.    POSSIBLE D/C IN 1-2 DAYS, DEPENDING ON CLINICAL CONDITION.   Vaughan Basta M.D on 12/05/2017   Between 7am to 6pm - Pager - [phone]  After 6pm go to [email or web address] - password [email or web address] [email or web address]  Office  [phone]  CC: Primary care physician; [name], MD  Note: This dictation was prepared with Dragon dictation along with smaller phrase technology. Any transcriptional errors that result from this process are unintentional.

## 2017-12-05 NOTE — ED Provider Notes (Signed)
-----------------------------------------   12:51 AM on 12/05/2017 -----------------------------------------  Care assumed from Dr. Cherylann Banas.  Patient's H/H continue to drop.  Will initiate blood transfusion; discussed with hospitalist to evaluate patient in the emergency department for admission.   Paulette Blanch, MD 12/05/17 [phone]

## 2017-12-05 NOTE — ED Notes (Signed)
Report called to adrian rn floor nurse 

## 2017-12-05 NOTE — ED Notes (Signed)
Lab called and states pt has antibodies so it will take a while before type and screen is complete.  Dr sung aware.

## 2017-12-05 NOTE — H&P (Signed)
[place] [facility] [place] NAME: Justin Leonard    MR#:  [id number]  DATE OF BIRTH:  January 12, 1927  DATE OF ADMISSION:  12/04/2017  PRIMARY CARE PHYSICIAN: [name], MD   REQUESTING/REFERRING PHYSICIAN:   CHIEF COMPLAINT:   Chief Complaint  Patient presents with  . Chest Pain    HISTORY OF PRESENT ILLNESS: Justin Leonard  is a 84 y.o. male with a known history of congestive heart failure, COPD, coronary artery disease, diabetes mellitus type 2, anemia, arthritis, hyperlipidemia, hypertension, peripheral neuropathy presented to the emergency room with chest pain.  The chest pain is located in the right side of the chest and started last night.  The pain was sharp in nature 5 out of 10 on a scale of 1-10.  Troponin has been negative in the emergency room.  Hemoglobin was around 7 and patient has some dark stool.  Patient takes oral iron tablets.  No history of any bright red blood per rectum.  Blood transfusion has been ordered by ER physician but patient has rare antibodies as per blood bank and awaiting for blood transfusion.  The chest pain has improved after coming to the emergency room.  Patient follows up with Dr. Humphrey Rolls for his cardiac issues as outpatient.   PAST MEDICAL HISTORY:   Past Medical History:  Diagnosis Date  . Anemia   . Arthritis   . Back pain   . CHF (congestive heart failure) ([place])   . COPD (chronic obstructive pulmonary disease) ([place])   . Coronary artery disease   . Deafness   . Depression   . Diabetes mellitus without complication ([place])   . Dizziness    when gets up  . GERD (gastroesophageal reflux disease)   . Hyperlipidemia   . Hypertension   . Melena   . Myocardial infarction ([place])    maybe a small one  . Obesity   . Oxygen decrease    WEARS HS,PRN  . Peripheral neuropathy   . Peripheral neuropathy    bil.hands and feet  . Skin cancer   . Umbilical hernia     PAST SURGICAL HISTORY:  Past  Surgical History:  Procedure Laterality Date  . CARDIAC CATHETERIZATION Left 07/17/2015   Procedure: Right/Left Heart Cath and Coronary Angiography;  Surgeon: [name], MD;  Location: [place] [facility];  Service: Cardiovascular;  Laterality: Left;  . CARDIAC CATHETERIZATION N/A 07/17/2015   Procedure: Coronary Stent Intervention;  Surgeon: [name], MD;  Location: [place] [facility];  Service: Cardiovascular;  Laterality: N/A;  . CARDIAC CATHETERIZATION Right 05/10/2016   Procedure: Left Heart Cath and Coronary Angiography;  Surgeon: [name], MD;  Location: [place] [facility];  Service: Cardiovascular;  Laterality: Right;  . CHOLECYSTECTOMY N/A 11/04/2016   Procedure: LAPAROSCOPIC CHOLECYSTECTOMY, POSSIBLE OPEN umbilical hernia repair;  Surgeon: [name], MD;  Location: [facility];  Service: General;  Laterality: N/A;  . COLONOSCOPY WITH PROPOFOL N/A 12/12/2015   Procedure: COLONOSCOPY WITH PROPOFOL;  Surgeon: [name], MD;  Location: [facility];  Service: Gastroenterology;  Laterality: N/A;  . CORONARY ANGIOPLASTY     stent 8/16  . CORONARY ARTERY BYPASS GRAFT    . ESOPHAGOGASTRODUODENOSCOPY N/A 11/10/2015   Procedure: ESOPHAGOGASTRODUODENOSCOPY (EGD);  Surgeon: [name], MD;  Location: [facility];  Service: Endoscopy;  Laterality: N/A;  . EYE SURGERY    . gall bladder drain    . MOHS SURGERY  SOCIAL HISTORY:  Social History   Tobacco Use  . Smoking status: Former Smoker    Packs/day: 3.00    Years: 30.00    Pack years: 90.00    Types: Cigarettes    Last attempt to quit: 04/16/1978    Years since quitting: 39.6  . Smokeless tobacco: Never Used  . Tobacco comment: Quit smoking 1979  Substance Use Topics  . Alcohol use: No    Alcohol/week: 0.0 oz    FAMILY HISTORY:  Family History  Problem Relation Age of Onset  . Hypertension Father   . CVA Father   . Anemia Neg Hx   . Arrhythmia Neg Hx   . Asthma Neg Hx   . Clotting disorder Neg  Hx   . Fainting Neg Hx   . Heart attack Neg Hx   . Heart disease Neg Hx   . Heart failure Neg Hx   . Hyperlipidemia Neg Hx   . Prostate cancer Neg Hx   . Bladder Cancer Neg Hx   . Kidney cancer Neg Hx     DRUG ALLERGIES: No Known Allergies  REVIEW OF SYSTEMS:   CONSTITUTIONAL: No fever, fatigue or weakness.  EYES: No blurred or double vision.  EARS, NOSE, AND THROAT: No tinnitus or ear pain.  RESPIRATORY: No cough, shortness of breath, wheezing or hemoptysis.  CARDIOVASCULAR: Has chest pain,  No orthopnea, edema.  GASTROINTESTINAL: No nausea, vomiting, diarrhea or abdominal pain.  GENITOURINARY: No dysuria, hematuria.  ENDOCRINE: No polyuria, nocturia,  HEMATOLOGY: Has anemia,  No easy bruising or bleeding SKIN: No rash or lesion. MUSCULOSKELETAL: No joint pain or arthritis.   NEUROLOGIC: No tingling, numbness, weakness.  PSYCHIATRY: No anxiety or depression.   MEDICATIONS AT HOME:  Prior to Admission medications   Medication Sig Start Date End Date Taking? Authorizing Provider  acetaminophen (TYLENOL) 650 MG CR tablet Take 650 mg by mouth 2 (two) times daily as needed for pain.    Yes [provider]  aspirin EC 81 MG tablet Take 81 mg by mouth daily.   Yes [provider]  clopidogrel (PLAVIX) 75 MG tablet Take 1 tablet (75 mg total) by mouth daily. 05/12/16  Yes Vaughan Basta, MD  ferrous sulfate 325 (65 FE) MG tablet Take 325 mg by mouth 2 (two) times daily.    Yes [provider]  FLUoxetine (PROZAC) 40 MG capsule Take 40mg  by mouth every morning 05/24/17  Yes [provider]  furosemide (LASIX) 40 MG tablet Take 1 tablet (40 mg total) by mouth daily. 11/27/15  Yes Carrie Mew, MD  HYDROcodone-acetaminophen (NORCO/VICODIN) 5-325 MG tablet  11/07/16  Yes [provider]  insulin glargine (LANTUS) 100 UNIT/ML injection Inject 15-18 Units into the skin at bedtime.    Yes [provider]  isosorbide  mononitrate (IMDUR) 30 MG 24 hr tablet Take 1 tablet (30 mg total) by mouth daily. 09/22/16  Yes [name], [name], MD  lovastatin (MEVACOR) 20 MG tablet Take 20 mg by mouth at bedtime.   Yes [provider]  metFORMIN (GLUCOPHAGE) 500 MG tablet Take 500 mg by mouth 2 (two) times daily.    Yes [provider]  metoprolol succinate (TOPROL-XL) 25 MG 24 hr tablet Take 25 mg by mouth daily.   Yes [provider]  nitroGLYCERIN (NITROSTAT) 0.4 MG SL tablet Place 0.4 mg under the tongue every 5 (five) minutes as needed for chest pain.   Yes [provider]  pantoprazole (PROTONIX) 40 MG tablet Take 40  mg by mouth daily.  08/26/15  Yes [provider]  Polyethylene Glycol 3350 (MIRALAX PO) Take 17 g by mouth daily.   Yes [provider]  ranolazine (RANEXA) 500 MG 12 hr tablet Take 500 mg by mouth 2 (two) times daily.    Yes [provider]  tamsulosin (FLOMAX) 0.4 MG CAPS capsule Take 1 capsule (0.4 mg total) by mouth daily after supper. 06/21/17  Yes McGowan, [name], PA-C  Tiotropium Bromide Monohydrate (SPIRIVA RESPIMAT) 2.5 MCG/ACT AERS Inhale 2 puffs into the lungs See admin instructions. Inhale 2 puffs 1 to 2 times a day.   Yes [provider]  OXYGEN Place 2 L into the nose Nightly.    [provider]  VENTOLIN HFA 108 (90 BASE) MCG/ACT inhaler Inhale 1-2 puffs into the lungs as needed for wheezing or shortness of breath. Reported on 12/16/2015 06/24/15   [provider]      PHYSICAL EXAMINATION:   VITAL SIGNS: Blood pressure (!) 210/89, pulse (!) 59, temperature (!) 97.4 F (36.3 C), temperature source Oral, resp. rate 14, height 6\' 3"  (1.905 m), weight 95.7 kg (211 lb), SpO2 100 %.  GENERAL:  84 y.o.-year-old patient lying in the bed with no acute distress.  EYES: Pupils equal, round, reactive to light and accommodation. No scleral icterus. Extraocular muscles intact. Pallor present. HEENT: Head atraumatic,  normocephalic. Oropharynx and nasopharynx clear.  NECK:  Supple, no jugular venous distention. No thyroid enlargement, no tenderness.  LUNGS: Normal breath sounds bilaterally, no wheezing, rales,rhonchi or crepitation. No use of accessory muscles of respiration.  CARDIOVASCULAR: S1, S2 normal. No murmurs, rubs, or gallops.  ABDOMEN: Soft, nontender, nondistended. Bowel sounds present. No organomegaly or mass.  EXTREMITIES: No pedal edema, cyanosis, or clubbing.  NEUROLOGIC: Cranial nerves II through XII are intact. Muscle strength 5/5 in all extremities. Sensation intact. Gait not checked.  PSYCHIATRIC: The patient is alert and oriented x 3.  SKIN: No obvious rash, lesion, or ulcer.   LABORATORY PANEL:   CBC Recent Labs  Lab 12/04/17 2047 12/05/17 0019  WBC 5.0 5.2  HGB 7.7* 7.0*  HCT 22.2* 20.1*  PLT 137* 126*  MCV 92.9 92.1  MCH 32.2 31.9  MCHC 34.6 34.7  RDW 13.2 13.3  LYMPHSABS  --  1.0  MONOABS  --  0.4  EOSABS  --  0.2  BASOSABS  --  0.0   ------------------------------------------------------------------------------------------------------------------  Chemistries  Recent Labs  Lab 12/04/17 2047  NA 139  K 4.0  CL 104  CO2 28  GLUCOSE 170*  BUN 22*  CREATININE 1.27*  CALCIUM 9.5   ------------------------------------------------------------------------------------------------------------------ estimated creatinine clearance is 48.1 mL/min (A) (by C-G formula based on SCr of 1.27 mg/dL (H)). ------------------------------------------------------------------------------------------------------------------ No results for input(s): TSH, T4TOTAL, T3FREE, THYROIDAB in the last 72 hours.  Invalid input(s): FREET3   Coagulation profile No results for input(s): INR, PROTIME in the last 168 hours. ------------------------------------------------------------------------------------------------------------------- No results for input(s): DDIMER in the last 72  hours. -------------------------------------------------------------------------------------------------------------------  Cardiac Enzymes Recent Labs  Lab 12/04/17 2047 12/05/17 0019  TROPONINI <0.03 <0.03   ------------------------------------------------------------------------------------------------------------------ Invalid input(s): POCBNP  ---------------------------------------------------------------------------------------------------------------  Urinalysis    Component Value Date/Time   COLORURINE AMBER (A) 05/09/2016 1845   APPEARANCEUR CLEAR (A) 05/09/2016 1845   APPEARANCEUR Clear 12/16/2015 1125   LABSPEC 1.023 05/09/2016 1845   PHURINE 5.0 05/09/2016 1845   GLUCOSEU NEGATIVE 05/09/2016 1845   HGBUR NEGATIVE 05/09/2016 1845   BILIRUBINUR NEGATIVE 05/09/2016 1845   BILIRUBINUR Negative 12/16/2015 1125  KETONESUR NEGATIVE 05/09/2016 1845   PROTEINUR 100 (A) 05/09/2016 1845   NITRITE NEGATIVE 05/09/2016 1845   LEUKOCYTESUR NEGATIVE 05/09/2016 1845   LEUKOCYTESUR Negative 12/16/2015 1125     RADIOLOGY: Dg Chest 2 View  Result Date: 12/04/2017 CLINICAL DATA:  Chest pain tonight. EXAM: CHEST  2 VIEW COMPARISON:  Radiograph 09/21/2016 FINDINGS: Mild cardiomegaly with tortuous atherosclerotic thoracic aorta. Post median sternotomy. No pulmonary edema. Minimal subsegmental atelectasis at the left lung base. No confluent consolidation, pleural effusion or pneumothorax. No acute osseous abnormalities are seen. IMPRESSION: Mild cardiomegaly and tortuous atherosclerotic aorta. No evidence of congestive failure. Electronically Signed   By: Jeb Levering M.D.   On: 12/04/2017 22:39    EKG: Orders placed or performed during the hospital encounter of 12/04/17  . EKG 12-Lead  . EKG 12-Lead    IMPRESSION AND PLAN: 84 year old elderly male patient with history of anemia, arthritis, diabetes mellitus, congestive heart failure, coronary artery disease, COPD,  hyperlipidemia presented to the emergency room with chest pain.  Admitting diagnosis 1.  Chest pain 2.  Symptomatic anemia 3.  Coronary artery disease 4.  Emphysema 5.  Congestive heart failure 6.  Uncontrolled hypertension 7.  Hyperlipidemia Treatment plan Admit patient to telemetry observation bed Transfuse PRBC IV Monitor hemoglobin hematocrit Cycle troponin Cardiology consultation Echocardiogram Control blood pressure  All the records are reviewed and case discussed with ED provider. Management plans discussed with the patient, family and they are in agreement.  CODE STATUS:FULL CODE Code Status History    Date Active Date Inactive Code Status Order ID Comments User Context   11/04/2016 09:19 11/05/2016 15:11 Full Code [id number]  [name], MD Inpatient   09/21/2016 16:05 09/22/2016 18:06 Full Code [id number]  Lytle Butte, MD ED   05/09/2016 16:17 05/12/2016 16:29 Full Code [id number]  Idelle Crouch, MD Inpatient   11/05/2015 06:38 11/14/2015 19:39 Full Code [id number]  Harrie Foreman, MD Inpatient   07/17/2015 16:14 07/18/2015 16:39 Full Code [id number]  [name], MD Inpatient   07/17/2015 15:41 07/17/2015 16:14 Full Code [id number]  [name], MD Inpatient   07/15/2015 17:04 07/17/2015 15:41 Full Code [id number]  Demetrios Loll, MD Inpatient    Advance Directive Documentation     Most Recent Value  Type of Advance Directive  Healthcare Power of Attorney, Living will  Pre-existing out of facility DNR order (yellow form or pink MOST form)  No data  "MOST" Form in Place?  No data       TOTAL TIME TAKING CARE OF THIS PATIENT: 50 minutes.    Saundra Shelling M.D on 12/05/2017 at 2:27 AM  Between 7am to 6pm - Pager - [phone]  After 6pm go to [email or web address] - password [email or web address] [facility]  Office  [phone]  CC: Primary care physician; [name], MD

## 2017-12-06 LAB — ECHOCARDIOGRAM COMPLETE
HEIGHTINCHES: 75 in
WEIGHTICAEL: 3462.4 [oz_av]

## 2017-12-07 LAB — TYPE AND SCREEN
ABO/RH(D): O POS
ANTIBODY SCREEN: POSITIVE
PT AG Type: NEGATIVE
UNIT TAG COMMENT: NEGATIVE
Unit division: 0
Unit division: 0
Unit tag comment: NEGATIVE

## 2017-12-07 LAB — BPAM RBC
Blood Product Expiration Date: 201902092359
Blood Product Expiration Date: 201902102359
UNIT TYPE AND RH: 5100
Unit Type and Rh: 5100

## 2017-12-07 LAB — PREPARE RBC (CROSSMATCH)

## 2017-12-08 NOTE — Discharge Summary (Signed)
[place] at [place] NAME: Justin Leonard    MR#:  [id number]  DATE OF BIRTH:  02/28/1927  DATE OF ADMISSION:  12/04/2017 ADMITTING PHYSICIAN: Saundra Shelling, MD  DATE OF DISCHARGE: 12/05/2017  6:04 PM  PRIMARY CARE PHYSICIAN: [name], MD    ADMISSION DIAGNOSIS:  Atypical chest pain [R07.89] Anemia requiring transfusions [D64.9] Symptomatic anemia [D64.9] Anemia, unspecified type [D64.9]  DISCHARGE DIAGNOSIS:  Active Problems:   Chest pain   SECONDARY DIAGNOSIS:   Past Medical History:  Diagnosis Date  . Anemia   . Arthritis   . Back pain   . CHF (congestive heart failure) ([place])   . COPD (chronic obstructive pulmonary disease) ([place])   . Coronary artery disease   . Deafness   . Depression   . Diabetes mellitus without complication ([place])   . Dizziness    when gets up  . GERD (gastroesophageal reflux disease)   . Hyperlipidemia   . Hypertension   . Melena   . Myocardial infarction ([place])    maybe a small one  . Obesity   . Oxygen decrease    WEARS HS,PRN  . Peripheral neuropathy   . Peripheral neuropathy    bil.hands and feet  . Skin cancer   . Umbilical hernia     HOSPITAL COURSE:   Admitted patient to telemetry observation bed- 3 troponins remained negative and patient was stable and pain-free Morning. Advised Transfuse PRBC IV- but before patient a transfusion in next morning patient's hemoglobin was 11.5, rechecked CBC and hemoglobin was still 11 so likely the admission hemoglobin was done on diluted blood. Advise no further workup. Patient denies any history of GI bleed. Monitor hemoglobin hematocrit  Cardiology consultation- appreciated and suggested no change in the medication and follow-up in the office. Echocardiogram done. Control blood pressure- appears to be high on presentation likely secondary to anxiety of being in ER. Patient's wife and son in the room confirm that he was stable on  his home blood pressure medication and recheck blood pressure frequently at home and it comes in the range of 619 systolic.    DISCHARGE CONDITIONS:   Stable.  CONSULTS OBTAINED:  Treatment Team:  [name], MD  DRUG ALLERGIES:  No Known Allergies  DISCHARGE MEDICATIONS:   Allergies as of 12/05/2017   No Known Allergies     Medication List    TAKE these medications   acetaminophen 650 MG CR tablet Commonly known as:  TYLENOL Take 650 mg by mouth 2 (two) times daily as needed for pain.   aspirin EC 81 MG tablet Take 81 mg by mouth daily.   clopidogrel 75 MG tablet Commonly known as:  PLAVIX Take 1 tablet (75 mg total) by mouth daily.   ferrous sulfate 325 (65 FE) MG tablet Take 325 mg by mouth 2 (two) times daily.   FLUoxetine 40 MG capsule Commonly known as:  PROZAC Take 40mg  by mouth every morning   furosemide 40 MG tablet Commonly known as:  LASIX Take 1 tablet (40 mg total) by mouth daily.   HYDROcodone-acetaminophen 5-325 MG tablet Commonly known as:  NORCO/VICODIN   insulin glargine 100 UNIT/ML injection Commonly known as:  LANTUS Inject 15-18 Units into the skin at bedtime.   isosorbide mononitrate 30 MG 24 hr tablet Commonly known as:  IMDUR Take 1 tablet (30 mg total) by mouth daily.   lisinopril 5 MG tablet Commonly known as:  PRINIVIL,ZESTRIL Take 1  tablet (5 mg total) by mouth daily.   lovastatin 20 MG tablet Commonly known as:  MEVACOR Take 20 mg by mouth at bedtime.   metFORMIN 500 MG tablet Commonly known as:  GLUCOPHAGE Take 500 mg by mouth 2 (two) times daily.   metoprolol succinate 25 MG 24 hr tablet Commonly known as:  TOPROL-XL Take 25 mg by mouth daily.   MIRALAX PO Take 17 g by mouth daily.   nitroGLYCERIN 0.4 MG SL tablet Commonly known as:  NITROSTAT Place 0.4 mg under the tongue every 5 (five) minutes as needed for chest pain.   OXYGEN Place 2 L into the nose Nightly.   pantoprazole 40 MG tablet Commonly  known as:  PROTONIX Take 40 mg by mouth daily.   ranolazine 500 MG 12 hr tablet Commonly known as:  RANEXA Take 500 mg by mouth 2 (two) times daily.   SPIRIVA RESPIMAT 2.5 MCG/ACT Aers Generic drug:  Tiotropium Bromide Monohydrate Inhale 2 puffs into the lungs See admin instructions. Inhale 2 puffs 1 to 2 times a day.   tamsulosin 0.4 MG Caps capsule Commonly known as:  FLOMAX Take 1 capsule (0.4 mg total) by mouth daily after supper.   VENTOLIN HFA 108 (90 Base) MCG/ACT inhaler Generic drug:  albuterol Inhale 1-2 puffs into the lungs as needed for wheezing or shortness of breath. Reported on 12/16/2015        DISCHARGE INSTRUCTIONS:    Follow with PMD in 1-2 weeks.  If you experience worsening of your admission symptoms, develop shortness of breath, life threatening emergency, suicidal or homicidal thoughts you must seek medical attention immediately by calling 911 or calling your MD immediately  if symptoms less severe.  You Must read complete instructions/literature along with all the possible adverse reactions/side effects for all the Medicines you take and that have been prescribed to you. Take any new Medicines after you have completely understood and accept all the possible adverse reactions/side effects.   Please note  You were cared for by a hospitalist during your hospital stay. If you have any questions about your discharge medications or the care you received while you were in the hospital after you are discharged, you can call the unit and asked to speak with the hospitalist on call if the hospitalist that took care of you is not available. Once you are discharged, your primary care physician will handle any further medical issues. Please note that NO REFILLS for any discharge medications will be authorized once you are discharged, as it is imperative that you return to your primary care physician (or establish a relationship with a primary care physician if you do not  have one) for your aftercare needs so that they can reassess your need for medications and monitor your lab values.    Today   CHIEF COMPLAINT:   Chief Complaint  Patient presents with  . Chest Pain    HISTORY OF PRESENT ILLNESS:  Justin Leonard  is a 84 y.o. male with a known history of congestive heart failure, COPD, coronary artery disease, diabetes mellitus type 2, anemia, arthritis, hyperlipidemia, hypertension, peripheral neuropathy presented to the emergency room with chest pain.  The chest pain is located in the right side of the chest and started last night.  The pain was sharp in nature 5 out of 10 on a scale of 1-10.  Troponin has been negative in the emergency room.  Hemoglobin was around 7 and patient has some dark stool.  Patient takes  oral iron tablets.  No history of any bright red blood per rectum.  Blood transfusion has been ordered by ER physician but patient has rare antibodies as per blood bank and awaiting for blood transfusion.  The chest pain has improved after coming to the emergency room.  Patient follows up with Dr. Humphrey Rolls for his cardiac issues as outpatient.    VITAL SIGNS:  Blood pressure (!) 162/60, pulse 65, temperature 98.2 F (36.8 C), temperature source Oral, resp. rate 18, height 6\' 3"  (1.905 m), weight 98.2 kg (216 lb 6.4 oz), SpO2 94 %.  I/O:  No intake or output data in the 24 hours ending 12/08/17 1103  PHYSICAL EXAMINATION:  GENERAL:  84 y.o.-year-old patient lying in the bed with no acute distress.  EYES: Pupils equal, round, reactive to light and accommodation. No scleral icterus. Extraocular muscles intact.  HEENT: Head atraumatic, normocephalic. Oropharynx and nasopharynx clear.  NECK:  Supple, no jugular venous distention. No thyroid enlargement, no tenderness.  LUNGS: Normal breath sounds bilaterally, no wheezing, rales,rhonchi or crepitation. No use of accessory muscles of respiration.  CARDIOVASCULAR: S1, S2 normal. No murmurs, rubs, or  gallops.  ABDOMEN: Soft, non-tender, non-distended. Bowel sounds present. No organomegaly or mass.  EXTREMITIES: No pedal edema, cyanosis, or clubbing.  NEUROLOGIC: Cranial nerves II through XII are intact. Muscle strength 5/5 in all extremities. Sensation intact. Gait not checked.  PSYCHIATRIC: The patient is alert and oriented x 3.  SKIN: No obvious rash, lesion, or ulcer.   DATA REVIEW:   CBC Recent Labs  Lab 12/05/17 1103  WBC 6.8  HGB 11.0*  HCT 31.9*  PLT 202    Chemistries  Recent Labs  Lab 12/05/17 0512  NA 137  K 3.7  CL 106  CO2 25  GLUCOSE 153*  BUN 19  CREATININE 1.01  CALCIUM 9.4    Cardiac Enzymes Recent Labs  Lab 12/05/17 1103  TROPONINI <0.03    Microbiology Results  Results for orders placed or performed during the hospital encounter of 05/09/16  Blood culture (routine single)     Status: None   Collection Time: 05/09/16 11:03 AM  Result Value Ref Range Status   Specimen Description BLOOD RIGHT ARM  Final   Special Requests BOTTLES DRAWN AEROBIC AND ANAEROBIC  2CC  Final   Culture NO GROWTH 5 DAYS  Final   Report Status 05/14/2016 FINAL  Final  Urine culture     Status: Abnormal   Collection Time: 05/09/16  6:45 PM  Result Value Ref Range Status   Specimen Description URINE, RANDOM  Final   Special Requests NONE  Final   Culture MULTIPLE SPECIES PRESENT, SUGGEST RECOLLECTION (A)  Final   Report Status 05/11/2016 FINAL  Final    RADIOLOGY:  No results found.  EKG:   Orders placed or performed during the hospital encounter of 12/04/17  . EKG 12-Lead  . EKG 12-Lead  . EKG      Management plans discussed with the patient, family and they are in agreement.  CODE STATUS:  Code Status History    Date Active Date Inactive Code Status Order ID Comments User Context   12/05/2017 02:38 12/05/2017 21:09 Full Code [id number]  Saundra Shelling, MD ED   11/04/2016 09:19 11/05/2016 15:11 Full Code [id number]  [name], MD Inpatient    09/21/2016 16:05 09/22/2016 18:06 Full Code [id number]  Lytle Butte, MD ED   05/09/2016 16:17 05/12/2016 16:29 Full Code [id number]  Idelle Crouch, MD Inpatient  11/05/2015 06:38 11/14/2015 19:39 Full Code [id number]  Harrie Foreman, MD Inpatient   07/17/2015 16:14 07/18/2015 16:39 Full Code [id number]  [name], MD Inpatient   07/17/2015 15:41 07/17/2015 16:14 Full Code [id number]  [name], MD Inpatient   07/15/2015 17:04 07/17/2015 15:41 Full Code [id number]  Demetrios Loll, MD Inpatient    Advance Directive Documentation     Most Recent Value  Type of Advance Directive  Healthcare Power of Attorney, Living will  Pre-existing out of facility DNR order (yellow form or pink MOST form)  No data  "MOST" Form in Place?  No data      TOTAL TIME TAKING CARE OF THIS PATIENT: 35 minutes.    Vaughan Basta M.D on 12/08/2017 at 11:03 AM  Between 7am to 6pm - Pager - [phone]  After 6pm go to [email or web address] - password [email or web address] [facility]  Office  [phone]  CC: Primary care physician; [name], MD   Note: This dictation was prepared with Dragon dictation along with smaller phrase technology. Any transcriptional errors that result from this process are unintentional.

## 2018-03-06 IMAGING — US US ABDOMEN LIMITED
1 series · 14 of 25 positions shown · non-contrast
Comparison: 05/09/2016

CLINICAL DATA: Cholelithiasis

EXAM:
US ABDOMEN LIMITED - RIGHT UPPER QUADRANT

[Series 1: us abdomen limited · 0.33mm/px · 14 of 51 slices shown]
[im 1/51]
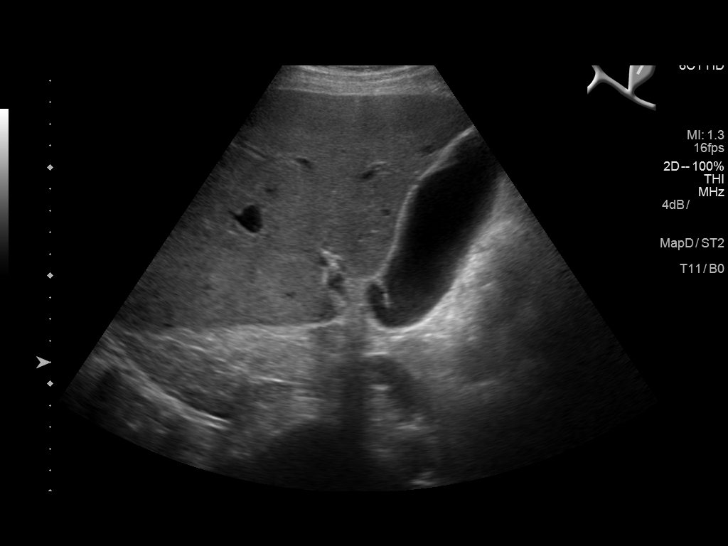
[im 5/51]
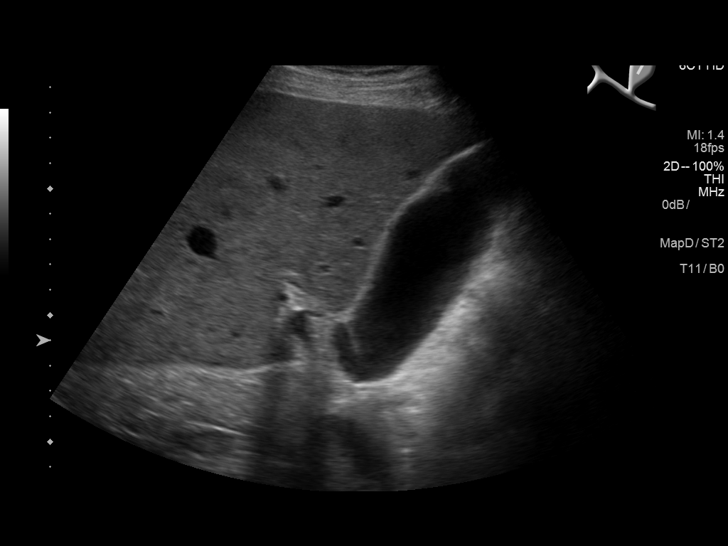
[im 9/51]
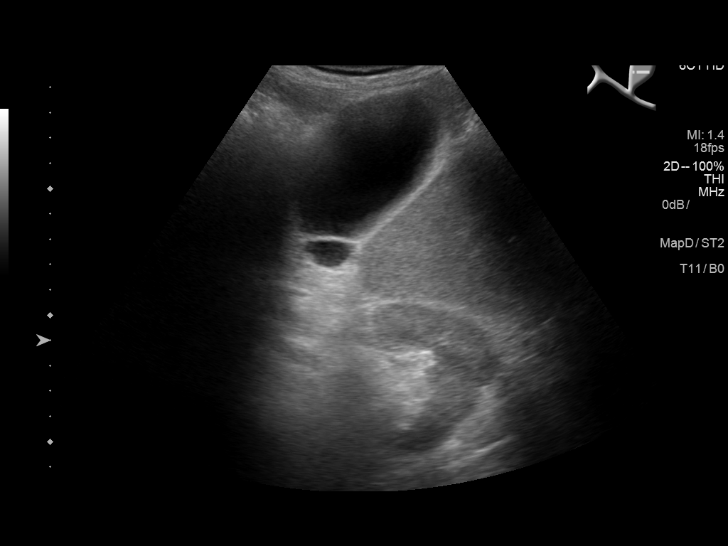
[im 13/51]
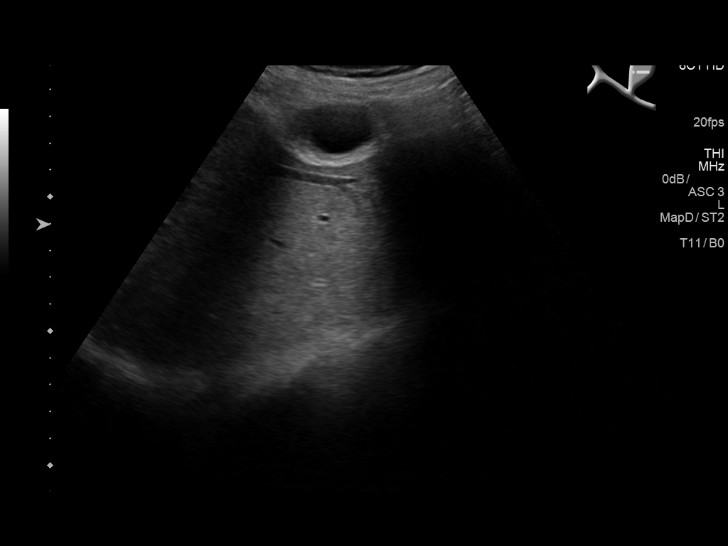
[im 17/51]
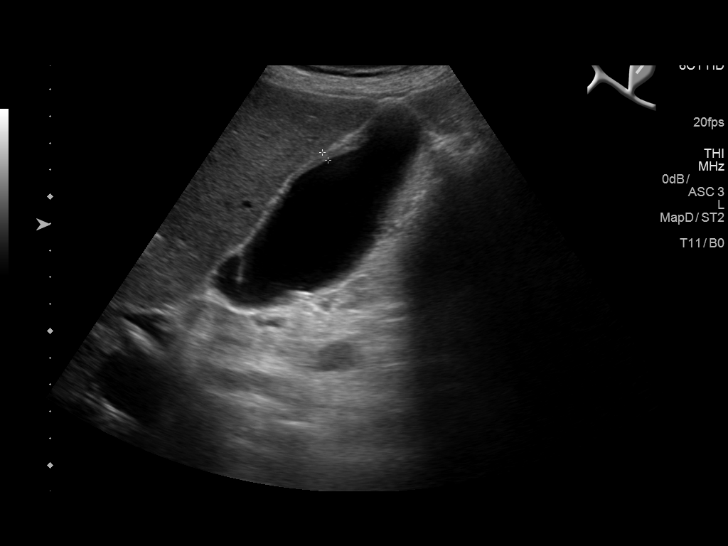
[im 19/51]
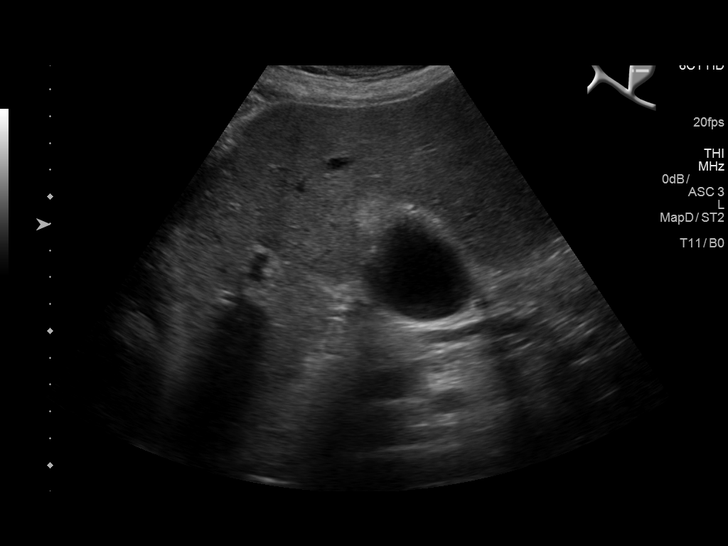
[im 23/51]
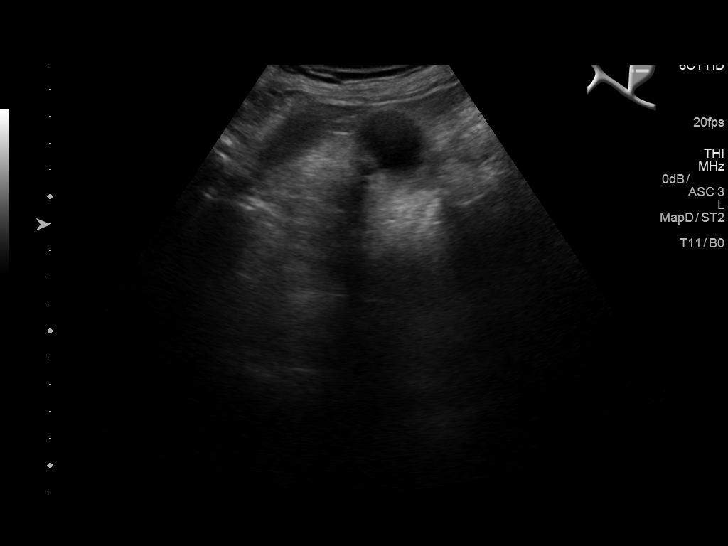
[im 28/51]
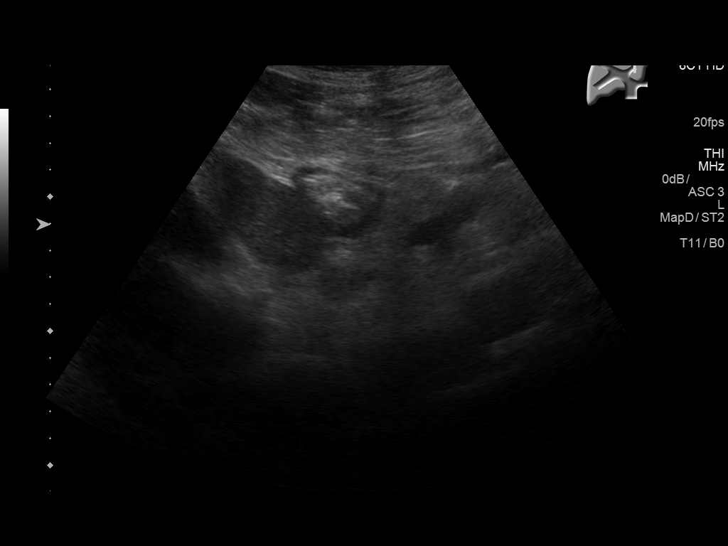
[im 32/51]
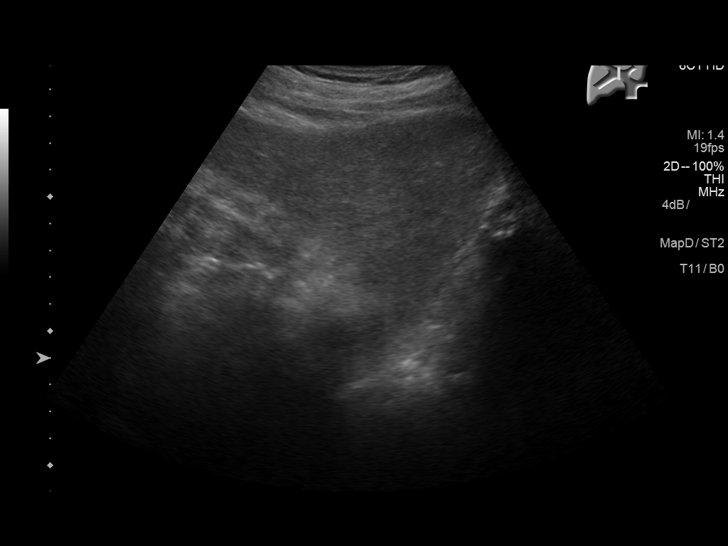
[im 34/51]
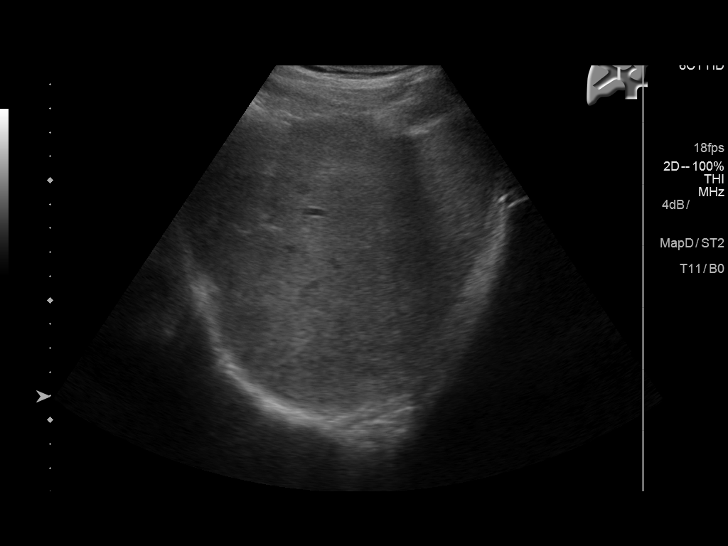
[im 38/51]
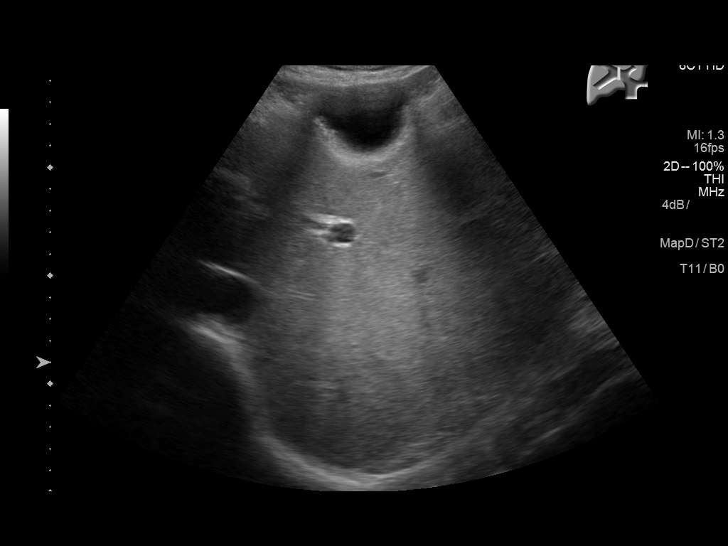
[im 42/51]
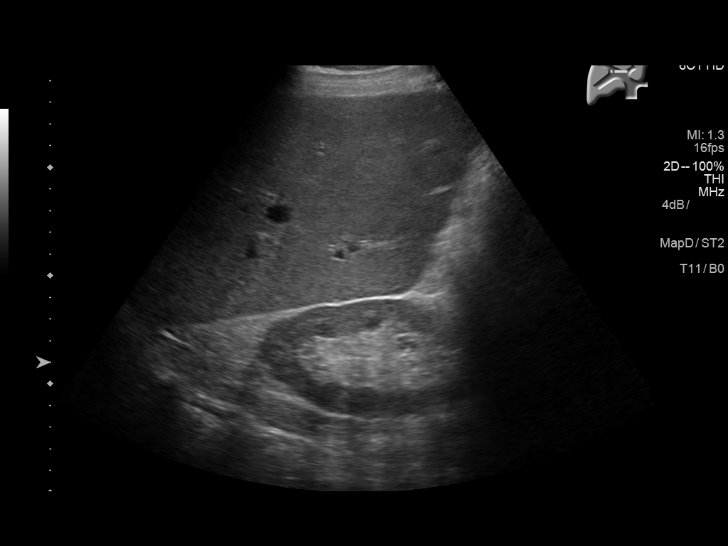
[im 46/51]
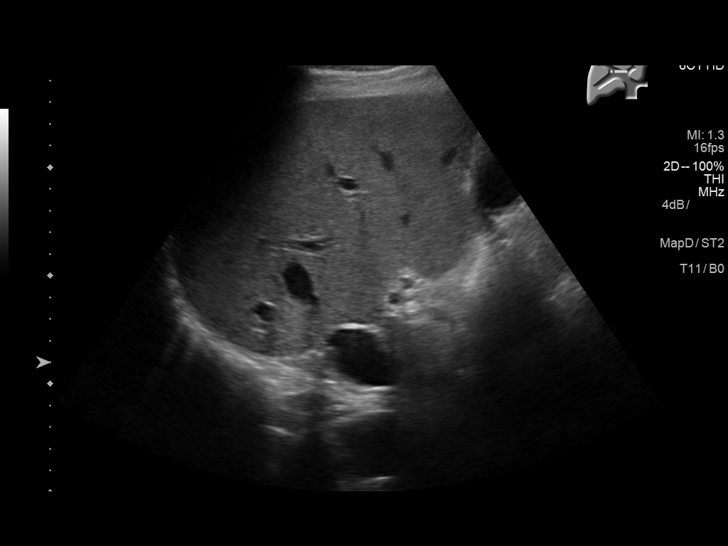
[im 51/51]
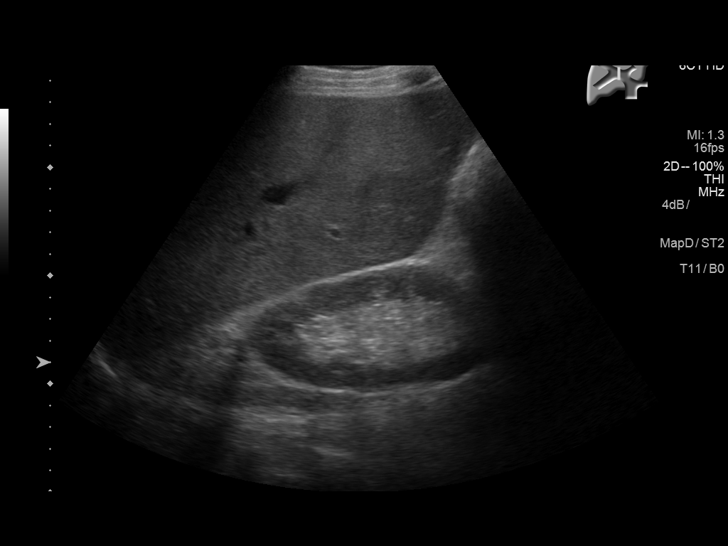

[14 of 25 positions shown; findings below may reference images not displayed]

FINDINGS: Gallbladder:

No gallstones are identified within gallbladder. Mild thickening of
gallbladder wall up to 3.4 mm. No sonographic Murphy's sign.

Common bile duct:

Diameter: 5.8 mm in diameter within normal limits.

Liver:

No focal lesion identified. Within normal limits in parenchymal
echogenicity.
IMPRESSION: 1. No gallstones are noted within gallbladder. Mild thickening of
gallbladder wall up to 3.4 mm. Normal CBD. No focal hepatic mass.

## 2018-06-22 ENCOUNTER — Ambulatory Visit: Payer: Medicare Other | Admitting: Urology

## 2018-07-05 NOTE — Progress Notes (Signed)
12:14 PM   Justin Leonard 03/02/1927 [id number]  Referring provider: [name], [place] [facility], [place] [address]  Chief Complaint  Patient presents with  . Follow-up    HPI: 84 yo WM with a history of urinary retention and BPH with LU TS who presents for a one year follow up.  Background history Patient is 84 year old Caucasian male with no previous urologic history presenting today for voiding trial after an acute episode of urinary retention after recent admission on 11/12/15 for sepsis and acute cholecystitis. A Foley catheter was placed during his admission with retention volume noted 900 cc. He denies any other previous urinary symptoms other than weak stream. He has never seen a urologist before and has not been on medications for an enlarged prostate. His CT scan did show a very large prostate protruding into his bladder. He also had some AKI which seemed to be resolving during admission and was attributed to his urinary retention. He was started on Flomax and passed his TOV.    Cr.  1.01 in 11/2017  Current Status: His IPSS score today is 17, which is moderate lower urinary tract symptomatology.  He is mostly satisfied with his quality life due to his urinary symptoms.  His I PSS score was 3/1.  His major complaint today is nocturia x 3.  He has had these symptoms for several years.  He denies any dysuria, hematuria or suprapubic pain.  He currently taking tamsulosin 0.4 mg daily.  He also denies any recent fevers, chills, nausea or vomiting.  He does not have a family history of PCa.  IPSS    Row Name 07/06/18 1100         International Prostate Symptom Score   How often have you had the sensation of not emptying your bladder?  About half the time     How often have you had to urinate less than every two hours?  About half the time     How often have you found you stopped and started again several times when you urinated?  About half the time     How  often have you found it difficult to postpone urination?  Less than half the time     How often have you had a weak urinary stream?  More than half the time     How often have you had to strain to start urination?  Not at All     How many times did you typically get up at night to urinate?  2 Times     Total IPSS Score  17       Quality of Life due to urinary symptoms   If you were to spend the rest of your life with your urinary condition just the way it is now how would you feel about that?  Mostly Satisfied        Score:  1-7 Mild 8-19 Moderate 20-35 Severe   PMH: Past Medical History:  Diagnosis Date  . Anemia   . Arthritis   . Back pain   . CHF (congestive heart failure) ([place])   . COPD (chronic obstructive pulmonary disease) ([place])   . Coronary artery disease   . Deafness   . Depression   . Diabetes mellitus without complication ([place])   . Dizziness    when gets up  . GERD (gastroesophageal reflux disease)   . Hyperlipidemia   . Hypertension   . Melena   . Myocardial  infarction [facility])    maybe a small one  . Obesity   . Oxygen decrease    WEARS HS,PRN  . Peripheral neuropathy   . Peripheral neuropathy    bil.hands and feet  . Skin cancer   . Umbilical hernia     Surgical History: Past Surgical History:  Procedure Laterality Date  . CARDIAC CATHETERIZATION Left 07/17/2015   Procedure: Right/Left Heart Cath and Coronary Angiography;  Surgeon: [name], MD;  Location: [facility];  Service: Cardiovascular;  Laterality: Left;  . CARDIAC CATHETERIZATION N/A 07/17/2015   Procedure: Coronary Stent Intervention;  Surgeon: [name], MD;  Location: [place] [facility];  Service: Cardiovascular;  Laterality: N/A;  . CARDIAC CATHETERIZATION Right 05/10/2016   Procedure: Left Heart Cath and Coronary Angiography;  Surgeon: [name], MD;  Location: [place] [facility];  Service: Cardiovascular;  Laterality: Right;  . CHOLECYSTECTOMY N/A  11/04/2016   Procedure: LAPAROSCOPIC CHOLECYSTECTOMY, POSSIBLE OPEN umbilical hernia repair;  Surgeon: [name], MD;  Location: [facility];  Service: General;  Laterality: N/A;  . COLONOSCOPY WITH PROPOFOL N/A 12/12/2015   Procedure: COLONOSCOPY WITH PROPOFOL;  Surgeon: [name], MD;  Location: [facility];  Service: Gastroenterology;  Laterality: N/A;  . CORONARY ANGIOPLASTY     stent 8/16  . CORONARY ARTERY BYPASS GRAFT    . ESOPHAGOGASTRODUODENOSCOPY N/A 11/10/2015   Procedure: ESOPHAGOGASTRODUODENOSCOPY (EGD);  Surgeon: [name], MD;  Location: [facility];  Service: Endoscopy;  Laterality: N/A;  . EYE SURGERY    . gall bladder drain    . MOHS SURGERY      Home Medications:  Allergies as of 07/06/2018   No Known Allergies     Medication List        Accurate as of 07/06/18 12:14 PM. Always use your most recent med list.          acetaminophen 650 MG CR tablet Commonly known as:  TYLENOL Take 650 mg by mouth 2 (two) times daily as needed for pain.   aspirin EC 81 MG tablet Take 81 mg by mouth daily.   clopidogrel 75 MG tablet Commonly known as:  PLAVIX Take 1 tablet (75 mg total) by mouth daily.   ferrous sulfate 325 (65 FE) MG tablet Take 325 mg by mouth 2 (two) times daily.   FLUoxetine 40 MG capsule Commonly known as:  PROZAC Take 40mg  by mouth every morning   furosemide 40 MG tablet Commonly known as:  LASIX Take 1 tablet (40 mg total) by mouth daily.   HYDROcodone-acetaminophen 5-325 MG tablet Commonly known as:  NORCO/VICODIN   insulin glargine 100 UNIT/ML injection Commonly known as:  LANTUS Inject 15-18 Units into the skin at bedtime.   isosorbide mononitrate 30 MG 24 hr tablet Commonly known as:  IMDUR Take 1 tablet (30 mg total) by mouth daily.   lisinopril 5 MG tablet Commonly known as:  PRINIVIL,ZESTRIL Take 1 tablet (5 mg total) by mouth daily.   lovastatin 20 MG tablet Commonly known as:  MEVACOR Take 20 mg by mouth at bedtime.     metFORMIN 500 MG tablet Commonly known as:  GLUCOPHAGE Take 500 mg by mouth 2 (two) times daily.   metoprolol succinate 25 MG 24 hr tablet Commonly known as:  TOPROL-XL Take 25 mg by mouth daily.   MIRALAX PO Take 17 g by mouth daily.   nitroGLYCERIN 0.4 MG SL tablet Commonly known as:  NITROSTAT Place 0.4 mg under  the tongue every 5 (five) minutes as needed for chest pain.   OXYGEN Place 2 L into the nose Nightly.   pantoprazole 40 MG tablet Commonly known as:  PROTONIX Take 40 mg by mouth daily.   ranolazine 500 MG 12 hr tablet Commonly known as:  RANEXA Take 500 mg by mouth 2 (two) times daily.   SPIRIVA RESPIMAT 2.5 MCG/ACT Aers Generic drug:  Tiotropium Bromide Monohydrate Inhale 2 puffs into the lungs See admin instructions. Inhale 2 puffs 1 to 2 times a day.   tamsulosin 0.4 MG Caps capsule Commonly known as:  FLOMAX Take 1 capsule (0.4 mg total) by mouth daily after supper.   VENTOLIN HFA 108 (90 Base) MCG/ACT inhaler Generic drug:  albuterol Inhale 1-2 puffs into the lungs as needed for wheezing or shortness of breath. Reported on 12/16/2015       Allergies: No Known Allergies  Family History: Family History  Problem Relation Age of Onset  . Hypertension Father   . CVA Father   . Anemia Neg Hx   . Arrhythmia Neg Hx   . Asthma Neg Hx   . Clotting disorder Neg Hx   . Fainting Neg Hx   . Heart attack Neg Hx   . Heart disease Neg Hx   . Heart failure Neg Hx   . Hyperlipidemia Neg Hx   . Prostate cancer Neg Hx   . Bladder Cancer Neg Hx   . Kidney cancer Neg Hx     Social History:  reports that he quit smoking about 40 years ago. His smoking use included cigarettes. He has a 90.00 pack-year smoking history. He has never used smokeless tobacco. He reports that he does not drink alcohol or use drugs.  ROS: UROLOGY Frequent Urination?: No Hard to postpone urination?: No Burning/pain with urination?: No Get up at night to urinate?: No Leakage of  urine?: No Urine stream starts and stops?: No Trouble starting stream?: No Do you have to strain to urinate?: No Blood in urine?: No Urinary tract infection?: No Sexually transmitted disease?: No Injury to kidneys or bladder?: No Painful intercourse?: No Weak stream?: No Erection problems?: No Penile pain?: No  Gastrointestinal Nausea?: No Vomiting?: No Indigestion/heartburn?: Yes Diarrhea?: No Constipation?: No  Constitutional Fever: No Night sweats?: No Weight loss?: No Fatigue?: No  Skin Skin rash/lesions?: No Itching?: Yes  Eyes Blurred vision?: No Double vision?: No  Ears/Nose/Throat Sore throat?: No Sinus problems?: No  Hematologic/Lymphatic Swollen glands?: No Easy bruising?: No  Cardiovascular Leg swelling?: No Chest pain?: No  Respiratory Cough?: No Shortness of breath?: Yes  Endocrine Excessive thirst?: No  Musculoskeletal Back pain?: No Joint pain?: No  Neurological Headaches?: No Dizziness?: Yes  Psychologic Depression?: No Anxiety?: Yes  Physical Exam: BP (!) 157/68   Pulse 67   Ht 6\' 2"  (1.88 m)   Wt 215 lb (97.5 kg)   BMI 27.60 kg/m   Constitutional: Well nourished. Alert and oriented, No acute distress. HEENT: [place] AT, moist mucus membranes. Trachea midline, no masses. Cardiovascular: No clubbing, cyanosis, or edema. Respiratory: Normal respiratory effort, no increased work of breathing. GI: Abdomen is soft, non tender, non distended, no abdominal masses. Liver and spleen not palpable.  No hernias appreciated.  Stool sample for occult testing is not indicated.   GU: No CVA tenderness.  No bladder fullness or masses.  Patient with uncircumcised phallus.  Foreskin easily retracted.   Urethral meatus is patent.  No penile discharge. No penile lesions or rashes. Scrotum  without lesions, cysts, rashes and/or edema.  Testicles are located scrotally bilaterally. No masses are appreciated in the testicles. Left and right epididymis  are normal. Rectal: Patient with  normal sphincter tone. Anus and perineum without scarring or rashes. No rectal masses are appreciated. Prostate is approximately 55 grams, raised area in the right lobe that is the same consistency as the rest of the gland. no nodules are appreciated. Seminal vesicles are normal. Skin: No rashes, bruises or suspicious lesions. Lymph: No cervical or inguinal adenopathy. Neurologic: Grossly intact, no focal deficits, moving all 4 extremities. Psychiatric: Normal mood and affect.   Laboratory Data: Results for orders placed or performed during the hospital encounter of 16/07/37  Basic metabolic panel  Result Value Ref Range   Sodium 139 135 - 145 mmol/L   Potassium 4.0 3.5 - 5.1 mmol/L   Chloride 104 101 - 111 mmol/L   CO2 28 22 - 32 mmol/L   Glucose, Bld 170 (H) 65 - 99 mg/dL   BUN 22 (H) 6 - 20 mg/dL   Creatinine, Ser 1.27 (H) 0.61 - 1.24 mg/dL   Calcium 9.5 8.9 - 10.3 mg/dL   GFR calc non Af Amer 49 (L) >60 mL/min   GFR calc Af Amer 56 (L) >60 mL/min   Anion gap 7 5 - 15  CBC  Result Value Ref Range   WBC 5.0 3.8 - 10.6 K/uL   RBC 2.39 (L) 4.40 - 5.90 MIL/uL   Hemoglobin 7.7 (L) 13.0 - 18.0 g/dL   HCT 22.2 (L) 40.0 - 52.0 %   MCV 92.9 80.0 - 100.0 fL   MCH 32.2 26.0 - 34.0 pg   MCHC 34.6 32.0 - 36.0 g/dL   RDW 13.2 11.5 - 14.5 %   Platelets 137 (L) 150 - 440 K/uL  Troponin I  Result Value Ref Range   Troponin I <0.03 <0.03 ng/mL  Troponin I  Result Value Ref Range   Troponin I <0.03 <0.03 ng/mL  CBC with Differential  Result Value Ref Range   WBC 5.2 3.8 - 10.6 K/uL   RBC 2.18 (L) 4.40 - 5.90 MIL/uL   Hemoglobin 7.0 (L) 13.0 - 18.0 g/dL   HCT 20.1 (L) 40.0 - 52.0 %   MCV 92.1 80.0 - 100.0 fL   MCH 31.9 26.0 - 34.0 pg   MCHC 34.7 32.0 - 36.0 g/dL   RDW 13.3 11.5 - 14.5 %   Platelets 126 (L) 150 - 440 K/uL   Neutrophils Relative % 69 %   Neutro Abs 3.6 1.4 - 6.5 K/uL   Lymphocytes Relative 19 %   Lymphs Abs 1.0 1.0 - 3.6 K/uL    Monocytes Relative 8 %   Monocytes Absolute 0.4 0.2 - 1.0 K/uL   Eosinophils Relative 3 %   Eosinophils Absolute 0.2 0 - 0.7 K/uL   Basophils Relative 1 %   Basophils Absolute 0.0 0 - 0.1 K/uL  Troponin I  Result Value Ref Range   Troponin I <0.03 <0.03 ng/mL  Troponin I  Result Value Ref Range   Troponin I <0.03 <0.03 ng/mL  Basic metabolic panel  Result Value Ref Range   Sodium 137 135 - 145 mmol/L   Potassium 3.7 3.5 - 5.1 mmol/L   Chloride 106 101 - 111 mmol/L   CO2 25 22 - 32 mmol/L   Glucose, Bld 153 (H) 65 - 99 mg/dL   BUN 19 6 - 20 mg/dL   Creatinine, Ser 1.01 0.61 - 1.24 mg/dL  Calcium 9.4 8.9 - 10.3 mg/dL   GFR calc non Af Amer >60 >60 mL/min   GFR calc Af Amer >60 >60 mL/min   Anion gap 6 5 - 15  Lipid panel  Result Value Ref Range   Cholesterol 102 0 - 200 mg/dL   Triglycerides 94 <150 mg/dL   HDL 38 (L) >40 mg/dL   Total CHOL/HDL Ratio 2.7 RATIO   VLDL 19 0 - 40 mg/dL   LDL Cholesterol 45 0 - 99 mg/dL  CBC  Result Value Ref Range   WBC 7.5 3.8 - 10.6 K/uL   RBC 3.65 (L) 4.40 - 5.90 MIL/uL   Hemoglobin 11.6 (L) 13.0 - 18.0 g/dL   HCT 33.6 (L) 40.0 - 52.0 %   MCV 92.1 80.0 - 100.0 fL   MCH 31.9 26.0 - 34.0 pg   MCHC 34.6 32.0 - 36.0 g/dL   RDW 13.6 11.5 - 14.5 %   Platelets 185 150 - 440 K/uL  Glucose, capillary  Result Value Ref Range   Glucose-Capillary 126 (H) 65 - 99 mg/dL  Glucose, capillary  Result Value Ref Range   Glucose-Capillary 129 (H) 65 - 99 mg/dL  CBC  Result Value Ref Range   WBC 6.8 3.8 - 10.6 K/uL   RBC 3.52 (L) 4.40 - 5.90 MIL/uL   Hemoglobin 11.0 (L) 13.0 - 18.0 g/dL   HCT 31.9 (L) 40.0 - 52.0 %   MCV 90.7 80.0 - 100.0 fL   MCH 31.4 26.0 - 34.0 pg   MCHC 34.6 32.0 - 36.0 g/dL   RDW 13.5 11.5 - 14.5 %   Platelets 202 150 - 440 K/uL  Glucose, capillary  Result Value Ref Range   Glucose-Capillary 159 (H) 65 - 99 mg/dL  Iron and TIBC  Result Value Ref Range   Iron 25 (L) 45 - 182 ug/dL   TIBC 260 250 - 450 ug/dL    Saturation Ratios 10 (L) 17.9 - 39.5 %   UIBC 235 ug/dL  Folate  Result Value Ref Range   Folate 12.1 >5.9 ng/mL  Vitamin B12  Result Value Ref Range   Vitamin B-12 299 180 - 914 pg/mL  Ferritin  Result Value Ref Range   Ferritin 119 24 - 336 ng/mL  Glucose, capillary  Result Value Ref Range   Glucose-Capillary 162 (H) 65 - 99 mg/dL  ECHOCARDIOGRAM COMPLETE  Result Value Ref Range   Weight 3,462.4 oz   Height 75 in   BP 135/54 mmHg  Type and screen [place]  Result Value Ref Range   ABO/RH(D) O POS    Antibody Screen POS    Sample Expiration 12/08/2017    Antibody Identification ANTI E    PT AG Type      NEGATIVE FOR c ANTIGEN Performed at [facility], [address], [place], [place] [address]    Unit Number [id number]    Blood Component Type RBC LR PHER2    Unit division 00    Status of Unit REL FROM [facility]    Transfusion Status OK TO TRANSFUSE    Crossmatch Result COMPATIBLE    Unit tag comment GIVE LITTLE C NEGATIVE BLOOD    Unit Number [id number]    Blood Component Type RBC LR PHER2    Unit division 00    Status of Unit REL FROM [facility]    Transfusion Status OK TO TRANSFUSE    Crossmatch Result COMPATIBLE    Unit tag comment GIVE LITTLE C  NEGATIVE BLOOD   Prepare RBC  Result Value Ref Range   Order Confirmation      ORDER PROCESSED BY BLOOD BANK Performed at [facility], [address], [place], [place] [address]   BP[facility]  Result Value Ref Range   Blood Product Unit Number [id number]    Unit Type and Rh 5100    Blood Product Expiration Date 383818403754    Blood Product Unit Number [id number]    Unit Type and Rh 5100    Blood Product Expiration Date [id number]    I have reviewed labs.     Assessment & Plan:   1. History of urinary retention-  Patient reports that he has been voiding well.    2. BPH with LUTS  - IPSS score is 3/1, it is improving  - Continue tamsulosin 0.4 mg daily;  refills given  - RTC in 12 months for IPSS and exam   Return in about 1 year (around 07/07/2019) for I PSS and exam .  These notes generated with voice recognition software. I apologize for typographical errors.  Zara Council, PA-C  [facility] [address] [place], [place] [address] [phone]

## 2018-07-06 ENCOUNTER — Encounter: Payer: Self-pay | Admitting: Urology

## 2018-07-06 ENCOUNTER — Ambulatory Visit (INDEPENDENT_AMBULATORY_CARE_PROVIDER_SITE_OTHER): Payer: Medicare Other | Admitting: Urology

## 2018-07-06 VITALS — BP 157/68 | HR 67 | Ht 74.0 in | Wt 215.0 lb

## 2018-07-06 DIAGNOSIS — N138 Other obstructive and reflux uropathy: Secondary | ICD-10-CM | POA: Diagnosis not present

## 2018-07-06 DIAGNOSIS — Z87898 Personal history of other specified conditions: Secondary | ICD-10-CM | POA: Diagnosis not present

## 2018-07-06 DIAGNOSIS — N401 Enlarged prostate with lower urinary tract symptoms: Secondary | ICD-10-CM | POA: Diagnosis not present

## 2018-07-06 MED ORDER — TAMSULOSIN HCL 0.4 MG PO CAPS: 0.4000 mg | ORAL_CAPSULE | Freq: Every day | ORAL | 3 refills | Status: DC

## 2018-10-10 NOTE — Progress Notes (Signed)
Patient ID: Justin Leonard, male    DOB: [birth year], 84 y.o.   MRN: [id number]  HPI  Justin Leonard is Leonard 84 y/o male with Leonard history of neuropathy, HTN, hyperlipidemia, DM, depression, CAD with CABG, COPD, remote tobacco use and chronic heart failure.   Echo report from 12/05/17 reviewed and showed an EF of 55%. Echo done 05/10/16 which showed an EF of 40% with mild Justin and PA pressure of 48 mm Hg. EF has decreased from Leonard previous echo done on 07/16/15 when the EF was 50-55%.   Had Leonard cardiac catheterization done on 05/10/16 which showed patent grafts although there was Leonard blockage distal to his stent & recommended aggressive medical therapy.  Has not been admitted or in the ED in the last 6 months.   [name] He describes this as being chronic for several years and isn't any better or any worse. He has associated pedal edema at times and depression along with this. He denies any difficulty sleeping, abdominal distention, palpitations, chest pain, shortness of breath, cough, fatigue or weight gain. Says that his wife is currently being treated for Leonard type of stomach cancer.   Past Medical History:  Diagnosis Date  . Anemia   . Arthritis   . Back pain   . CHF (congestive heart failure) ([place])   . COPD (chronic obstructive pulmonary disease) ([place])   . Coronary artery disease   . Deafness   . Depression   . Diabetes mellitus without complication ([place])   . Dizziness    when gets up  . GERD (gastroesophageal reflux disease)   . Hyperlipidemia   . Hypertension   . Melena   . Myocardial infarction ([place])    maybe Leonard small one  . Obesity   . Oxygen decrease    WEARS HS,PRN  . Peripheral neuropathy   . Peripheral neuropathy    bil.hands and feet  . Skin cancer   . Umbilical hernia     Past Surgical History:  Procedure Laterality Date  . CARDIAC CATHETERIZATION Left 07/17/2015   Procedure: Right/Left Heart Cath and  Coronary Angiography;  Surgeon: [name], Justin Leonard;  Location: [facility];  Service: Cardiovascular;  Laterality: Left;  . CARDIAC CATHETERIZATION N/Leonard 07/17/2015   Procedure: Coronary Stent Intervention;  Surgeon: [name], Justin Leonard;  Location: [facility];  Service: Cardiovascular;  Laterality: N/Leonard;  . CARDIAC CATHETERIZATION Right 05/10/2016   Procedure: Left Heart Cath and Coronary Angiography;  Surgeon: [name], Justin Leonard;  Location: [facility];  Service: Cardiovascular;  Laterality: Right;  . CHOLECYSTECTOMY N/Leonard 11/04/2016   Procedure: LAPAROSCOPIC CHOLECYSTECTOMY, POSSIBLE OPEN umbilical hernia repair;  Surgeon: [name], Justin Leonard;  Location: [facility];  Service: General;  Laterality: N/Leonard;  . COLONOSCOPY WITH PROPOFOL N/Leonard 12/12/2015   Procedure: COLONOSCOPY WITH PROPOFOL;  Surgeon: [name], Justin Leonard;  Location: [facility];  Service: Gastroenterology;  Laterality: N/Leonard;  . CORONARY ANGIOPLASTY     stent 8/16  . CORONARY ARTERY BYPASS GRAFT    . ESOPHAGOGASTRODUODENOSCOPY N/Leonard 11/10/2015   Procedure: ESOPHAGOGASTRODUODENOSCOPY (EGD);  Surgeon: [name], Justin Leonard;  Location: [facility];  Service: Endoscopy;  Laterality: N/Leonard;  . EYE SURGERY    . gall bladder drain    . MOHS SURGERY      Family History  Problem Relation Age of Onset  . Hypertension Father   . CVA Father   .  Anemia Neg Hx   . Arrhythmia Neg Hx   . Asthma Neg Hx   . Clotting disorder Neg Hx   . Fainting Neg Hx   . Heart attack Neg Hx   . Heart disease Neg Hx   . Heart failure Neg Hx   . Hyperlipidemia Neg Hx   . Prostate cancer Neg Hx   . Bladder Cancer Neg Hx   . Kidney cancer Neg Hx     Social History   Tobacco Use  . Smoking status: Former Smoker    Packs/day: 3.00    Years: 30.00    Pack years: 90.00    Types: Cigarettes    Last attempt to quit: 04/16/1978    Years since quitting: 40.5  . Smokeless tobacco: Never Used  . Tobacco comment: Quit smoking 1979  Substance Use Topics   . Alcohol use: No    Alcohol/week: 0.0 standard drinks    No Known Allergies  Prior to Admission medications   Medication Sig Start Date End Date Taking? Authorizing Provider  acetaminophen (TYLENOL) 650 MG CR tablet Take 650 mg by mouth 2 (two) times daily as needed for pain.    Yes Provider, Historical, Justin Leonard  aspirin EC 81 MG tablet Take 81 mg by mouth daily.   Yes Provider, Historical, Justin Leonard  clopidogrel (PLAVIX) 75 MG tablet Take 1 tablet (75 mg total) by mouth daily. 05/12/16  Yes Justin Basta, Justin Leonard  ferrous sulfate 325 (65 FE) MG tablet Take 325 mg by mouth 2 (two) times daily.    Yes Provider, Historical, Justin Leonard  FLUoxetine (PROZAC) 40 MG capsule Take 40mg  by mouth every morning 05/24/17  Yes Provider, Historical, Justin Leonard  furosemide (LASIX) 40 MG tablet Take 1 tablet (40 mg total) by mouth daily. 11/27/15  Yes Justin Mew, Justin Leonard  insulin glargine (LANTUS) 100 UNIT/ML injection Inject 15-18 Units into the skin at bedtime.    Yes Provider, Historical, Justin Leonard  lisinopril (PRINIVIL,ZESTRIL) 5 MG tablet Take 1 tablet (5 mg total) by mouth daily. Patient taking differently: Take 2.5 mg by mouth daily.  12/06/17  Yes Justin Basta, Justin Leonard  lovastatin (MEVACOR) 20 MG tablet Take 20 mg by mouth at bedtime.   Yes Provider, Historical, Justin Leonard  metFORMIN (GLUCOPHAGE) 500 MG tablet Take 500 mg by mouth 2 (two) times daily.    Yes Provider, Historical, Justin Leonard  metoprolol succinate (TOPROL-XL) 25 MG 24 hr tablet Take 25 mg by mouth daily.   Yes Provider, Historical, Justin Leonard  nitroGLYCERIN (NITROSTAT) 0.4 MG SL tablet Place 0.4 mg under the tongue every 5 (five) minutes as needed for chest pain.   Yes Provider, Historical, Justin Leonard  pantoprazole (PROTONIX) 40 MG tablet Take 40 mg by mouth daily.  08/26/15  Yes Provider, Historical, Justin Leonard  Polyethylene Glycol 3350 (MIRALAX PO) Take 17 g by mouth daily.   Yes Provider, Historical, Justin Leonard  ranolazine (RANEXA) 500 MG 12 hr tablet Take 500 mg by mouth 2 (two) times daily.    Yes  Provider, Historical, Justin Leonard  tamsulosin (FLOMAX) 0.4 MG CAPS capsule Take 1 capsule (0.4 mg total) by mouth daily after supper. 07/06/18  Yes Justin Leonard, Justin Beach Leonard, Justin Leonard  Tiotropium Bromide Monohydrate (SPIRIVA RESPIMAT) 2.5 MCG/ACT AERS Inhale 2 puffs into the lungs See admin instructions. Inhale 2 puffs 1 to 2 times Leonard day.   Yes Provider, Historical, Justin Leonard  HYDROcodone-acetaminophen (NORCO/VICODIN) 5-325 MG tablet  11/07/16   Provider, Historical, Justin Leonard  isosorbide mononitrate (IMDUR) 30 MG 24 hr tablet Take 1 tablet (30 mg total)  by mouth daily. Patient not taking: Reported on 10/12/2018 09/22/16   Justin Mango, Justin Leonard  OXYGEN Place 2 L into the nose Nightly.    Provider, Historical, Justin Leonard  VENTOLIN HFA 108 (90 BASE) MCG/ACT inhaler Inhale 1-2 puffs into the lungs as needed for wheezing or shortness of breath. Reported on 12/16/2015 06/24/15   Provider, Historical, Justin Leonard    Review of Systems  Constitutional: Negative for appetite change and fatigue.  HENT: Positive for hearing loss. Negative for congestion, rhinorrhea and sore throat.   Eyes: Negative.   Respiratory: Negative for cough, chest tightness and shortness of breath.   Cardiovascular: Positive for leg swelling (if not propping up during the day). Negative for chest pain and palpitations.  Gastrointestinal: Negative for abdominal distention and abdominal pain.  Endocrine: Negative.   Genitourinary: Negative.   Musculoskeletal: Negative for back pain and neck pain.  Skin: Negative.   Allergic/Immunologic: Negative.   Neurological: Positive for light-headedness (off balance "all the time"). Negative for dizziness.  Hematological: Negative for adenopathy. Does not bruise/bleed easily.  Psychiatric/Behavioral: Positive for dysphoric mood. Negative for sleep disturbance (sleeping with oxygen). The patient is not nervous/anxious.    Vitals:   10/12/18 1052  BP: (!) 176/77  Pulse: (!) 57  Resp: 18  SpO2: 99%  Weight: 224 lb 2 oz (101.7 kg)  Height: 6\' 2"   (1.88 m)   Wt Readings from Last 3 Encounters:  10/12/18 224 lb 2 oz (101.7 kg)  07/06/18 215 lb (97.5 kg)  12/05/17 216 lb 6.4 oz (98.2 kg)   Lab Results  Component Value Date   CREATININE 1.01 12/05/2017   CREATININE 1.27 (H) 12/04/2017   CREATININE 1.12 11/04/2016    Physical Exam  Constitutional: He is oriented to person, place, and time. He appears well-developed and well-nourished.  HENT:  Head: Normocephalic and atraumatic.  Neck: Normal range of motion. Neck supple.  Cardiovascular: Regular rhythm. Bradycardia present.  Pulmonary/Chest: Effort normal. He has no wheezes. He has no rales.  Abdominal: Soft. He exhibits no distension. There is no tenderness.  Musculoskeletal: He exhibits edema (trace pitting edema in bilateral lower legs). He exhibits no tenderness.  Neurological: He is alert and oriented to person, place, and time.  Skin: Skin is warm and dry.  Psychiatric: He has Leonard normal mood and affect. His behavior is normal. Thought content normal.  Nursing note and vitals reviewed.  Assessment & Plan:  1: Chronic heart failure with preserved ejection fraction- - NYHA class I - euvolemic - weighing daily.  Reminded to call for an overnight weight gain of >2 pounds or Leonard weekly weight gain of >5 pounds.  - weight up 4 pounds since his last visit here 1 year ago - has recently seen his cardiologist Justin Leonard) - not adding salt to his food and he tries to follow Leonard 2000mg  sodium diet.  - encouraged him to elevate his legs as much as possible - wears oxygen at 2L at bedtime - patient reports receiving flu vaccine for this season  2: HTN- - BP mildly elevated but he admits that he's under stress with his wife's cancer - BMP from 12/05/17 reviewed and showed sodium 137, potassium 3.7, creatinine 1.01 and GFR >60 - saw PCP (Justin Leonard) 09/05/18  3: DM- - glucose at home this morning was 124 - A1c 09/05/18 was 5.6%  Medication list was reviewed with the patient.   Return in 1  year or sooner for any questions/problems before then.

## 2018-10-12 ENCOUNTER — Ambulatory Visit: Payer: Medicare Other | Attending: Family | Admitting: Family

## 2018-10-12 ENCOUNTER — Encounter: Payer: Self-pay | Admitting: Family

## 2018-10-12 VITALS — BP 176/77 | HR 57 | Resp 18 | Ht 74.0 in | Wt 224.1 lb

## 2018-10-12 DIAGNOSIS — Z85828 Personal history of other malignant neoplasm of skin: Secondary | ICD-10-CM | POA: Insufficient documentation

## 2018-10-12 DIAGNOSIS — Z794 Long term (current) use of insulin: Secondary | ICD-10-CM | POA: Insufficient documentation

## 2018-10-12 DIAGNOSIS — Z951 Presence of aortocoronary bypass graft: Secondary | ICD-10-CM | POA: Diagnosis not present

## 2018-10-12 DIAGNOSIS — Z7982 Long term (current) use of aspirin: Secondary | ICD-10-CM | POA: Diagnosis not present

## 2018-10-12 DIAGNOSIS — E785 Hyperlipidemia, unspecified: Secondary | ICD-10-CM | POA: Diagnosis not present

## 2018-10-12 DIAGNOSIS — Z87891 Personal history of nicotine dependence: Secondary | ICD-10-CM | POA: Insufficient documentation

## 2018-10-12 DIAGNOSIS — I252 Old myocardial infarction: Secondary | ICD-10-CM | POA: Diagnosis not present

## 2018-10-12 DIAGNOSIS — I5032 Chronic diastolic (congestive) heart failure: Secondary | ICD-10-CM | POA: Diagnosis not present

## 2018-10-12 DIAGNOSIS — F329 Major depressive disorder, single episode, unspecified: Secondary | ICD-10-CM | POA: Diagnosis not present

## 2018-10-12 DIAGNOSIS — K219 Gastro-esophageal reflux disease without esophagitis: Secondary | ICD-10-CM | POA: Insufficient documentation

## 2018-10-12 DIAGNOSIS — I110 Hypertensive heart disease with heart failure: Secondary | ICD-10-CM | POA: Diagnosis not present

## 2018-10-12 DIAGNOSIS — I2510 Atherosclerotic heart disease of native coronary artery without angina pectoris: Secondary | ICD-10-CM | POA: Diagnosis not present

## 2018-10-12 DIAGNOSIS — Z955 Presence of coronary angioplasty implant and graft: Secondary | ICD-10-CM | POA: Insufficient documentation

## 2018-10-12 DIAGNOSIS — I10 Essential (primary) hypertension: Secondary | ICD-10-CM

## 2018-10-12 DIAGNOSIS — E1140 Type 2 diabetes mellitus with diabetic neuropathy, unspecified: Secondary | ICD-10-CM | POA: Diagnosis not present

## 2018-10-12 DIAGNOSIS — Z79899 Other long term (current) drug therapy: Secondary | ICD-10-CM | POA: Insufficient documentation

## 2018-10-12 DIAGNOSIS — E669 Obesity, unspecified: Secondary | ICD-10-CM | POA: Insufficient documentation

## 2018-10-12 DIAGNOSIS — Z6828 Body mass index (BMI) 28.0-28.9, adult: Secondary | ICD-10-CM | POA: Insufficient documentation

## 2018-10-12 DIAGNOSIS — J449 Chronic obstructive pulmonary disease, unspecified: Secondary | ICD-10-CM | POA: Diagnosis not present

## 2018-10-12 DIAGNOSIS — E119 Type 2 diabetes mellitus without complications: Secondary | ICD-10-CM

## 2018-10-12 NOTE — Patient Instructions (Signed)
Continue weighing daily and call for an overnight weight gain of > 2 pounds or a weekly weight gain of >5 pounds. 

## 2019-01-08 ENCOUNTER — Other Ambulatory Visit: Payer: Self-pay

## 2019-01-08 ENCOUNTER — Inpatient Hospital Stay
Admission: EM | Admit: 2019-01-08 | Discharge: 2019-01-10 | DRG: 309 | Disposition: A | Discharge status: Home / Self Care | Payer: Medicare Other | Attending: Specialist | Admitting: Specialist

## 2019-01-08 ENCOUNTER — Encounter: Payer: Self-pay | Admitting: Intensive Care

## 2019-01-08 DIAGNOSIS — I480 Paroxysmal atrial fibrillation: Secondary | ICD-10-CM | POA: Diagnosis not present

## 2019-01-08 DIAGNOSIS — Z85828 Personal history of other malignant neoplasm of skin: Secondary | ICD-10-CM

## 2019-01-08 DIAGNOSIS — H9190 Unspecified hearing loss, unspecified ear: Secondary | ICD-10-CM | POA: Diagnosis present

## 2019-01-08 DIAGNOSIS — E1142 Type 2 diabetes mellitus with diabetic polyneuropathy: Secondary | ICD-10-CM | POA: Diagnosis present

## 2019-01-08 DIAGNOSIS — M549 Dorsalgia, unspecified: Secondary | ICD-10-CM | POA: Diagnosis present

## 2019-01-08 DIAGNOSIS — I10 Essential (primary) hypertension: Secondary | ICD-10-CM | POA: Diagnosis present

## 2019-01-08 DIAGNOSIS — E860 Dehydration: Secondary | ICD-10-CM | POA: Diagnosis present

## 2019-01-08 DIAGNOSIS — Z7901 Long term (current) use of anticoagulants: Secondary | ICD-10-CM

## 2019-01-08 DIAGNOSIS — Z794 Long term (current) use of insulin: Secondary | ICD-10-CM

## 2019-01-08 DIAGNOSIS — K219 Gastro-esophageal reflux disease without esophagitis: Secondary | ICD-10-CM | POA: Diagnosis present

## 2019-01-08 DIAGNOSIS — Z87891 Personal history of nicotine dependence: Secondary | ICD-10-CM

## 2019-01-08 DIAGNOSIS — Z8249 Family history of ischemic heart disease and other diseases of the circulatory system: Secondary | ICD-10-CM | POA: Diagnosis not present

## 2019-01-08 DIAGNOSIS — I4510 Unspecified right bundle-branch block: Secondary | ICD-10-CM | POA: Diagnosis present

## 2019-01-08 DIAGNOSIS — I5032 Chronic diastolic (congestive) heart failure: Secondary | ICD-10-CM | POA: Diagnosis present

## 2019-01-08 DIAGNOSIS — Z9981 Dependence on supplemental oxygen: Secondary | ICD-10-CM | POA: Diagnosis not present

## 2019-01-08 DIAGNOSIS — F329 Major depressive disorder, single episode, unspecified: Secondary | ICD-10-CM | POA: Diagnosis present

## 2019-01-08 DIAGNOSIS — E119 Type 2 diabetes mellitus without complications: Secondary | ICD-10-CM

## 2019-01-08 DIAGNOSIS — E785 Hyperlipidemia, unspecified: Secondary | ICD-10-CM | POA: Diagnosis present

## 2019-01-08 DIAGNOSIS — K529 Noninfective gastroenteritis and colitis, unspecified: Secondary | ICD-10-CM | POA: Diagnosis present

## 2019-01-08 DIAGNOSIS — Z951 Presence of aortocoronary bypass graft: Secondary | ICD-10-CM

## 2019-01-08 DIAGNOSIS — I252 Old myocardial infarction: Secondary | ICD-10-CM | POA: Diagnosis not present

## 2019-01-08 DIAGNOSIS — Z7982 Long term (current) use of aspirin: Secondary | ICD-10-CM | POA: Diagnosis not present

## 2019-01-08 DIAGNOSIS — Z823 Family history of stroke: Secondary | ICD-10-CM | POA: Diagnosis not present

## 2019-01-08 DIAGNOSIS — I110 Hypertensive heart disease with heart failure: Secondary | ICD-10-CM | POA: Diagnosis present

## 2019-01-08 DIAGNOSIS — J449 Chronic obstructive pulmonary disease, unspecified: Secondary | ICD-10-CM | POA: Diagnosis present

## 2019-01-08 DIAGNOSIS — R197 Diarrhea, unspecified: Secondary | ICD-10-CM

## 2019-01-08 DIAGNOSIS — I2510 Atherosclerotic heart disease of native coronary artery without angina pectoris: Secondary | ICD-10-CM | POA: Diagnosis present

## 2019-01-08 DIAGNOSIS — I4891 Unspecified atrial fibrillation: Secondary | ICD-10-CM | POA: Diagnosis present

## 2019-01-08 DIAGNOSIS — R112 Nausea with vomiting, unspecified: Secondary | ICD-10-CM | POA: Diagnosis not present

## 2019-01-08 DIAGNOSIS — N400 Enlarged prostate without lower urinary tract symptoms: Secondary | ICD-10-CM | POA: Diagnosis present

## 2019-01-08 LAB — MAGNESIUM: Magnesium: 1.7 mg/dL (ref 1.7–2.4)

## 2019-01-08 LAB — CBC
HCT: 41.3 % (ref 39.0–52.0)
Hemoglobin: 13.2 g/dL (ref 13.0–17.0)
MCH: 30.6 pg (ref 26.0–34.0)
MCHC: 32 g/dL (ref 30.0–36.0)
MCV: 95.6 fL (ref 80.0–100.0)
NRBC: 0 % (ref 0.0–0.2)
Platelets: 223 10*3/uL (ref 150–400)
RBC: 4.32 MIL/uL (ref 4.22–5.81)
RDW: 13.2 % (ref 11.5–15.5)
WBC: 13.4 10*3/uL — ABNORMAL HIGH (ref 4.0–10.5)

## 2019-01-08 LAB — COMPREHENSIVE METABOLIC PANEL
ALK PHOS: 108 U/L (ref 38–126)
ALT: 90 U/L — ABNORMAL HIGH (ref 0–44)
AST: 55 U/L — ABNORMAL HIGH (ref 15–41)
Albumin: 4 g/dL (ref 3.5–5.0)
Anion gap: 10 (ref 5–15)
BUN: 29 mg/dL — ABNORMAL HIGH (ref 8–23)
CO2: 24 mmol/L (ref 22–32)
Calcium: 9.5 mg/dL (ref 8.9–10.3)
Chloride: 105 mmol/L (ref 98–111)
Creatinine, Ser: 1.34 mg/dL — ABNORMAL HIGH (ref 0.61–1.24)
GFR calc Af Amer: 54 mL/min — ABNORMAL LOW (ref >60)
GFR calc non Af Amer: 47 mL/min — ABNORMAL LOW (ref >60)
Glucose, Bld: 181 mg/dL — ABNORMAL HIGH (ref 70–99)
Potassium: 4.1 mmol/L (ref 3.5–5.1)
Sodium: 139 mmol/L (ref 135–145)
Total Bilirubin: 0.6 mg/dL (ref 0.3–1.2)
Total Protein: 7.1 g/dL (ref 6.5–8.1)

## 2019-01-08 LAB — INFLUENZA PANEL BY PCR (TYPE A & B)
INFLBPCR: NEGATIVE
Influenza A By PCR: NEGATIVE

## 2019-01-08 LAB — URINALYSIS, COMPLETE (UACMP) WITH MICROSCOPIC
Bilirubin Urine: NEGATIVE
Glucose, UA: NEGATIVE mg/dL
HGB URINE DIPSTICK: NEGATIVE
Ketones, ur: 5 mg/dL — AB
LEUKOCYTES UA: NEGATIVE
Nitrite: NEGATIVE
Protein, ur: 100 mg/dL — AB
SPECIFIC GRAVITY, URINE: 1.018 (ref 1.005–1.030)
pH: 5 (ref 5.0–8.0)

## 2019-01-08 LAB — PROTIME-INR
INR: 0.97
Prothrombin Time: 12.8 seconds (ref 11.4–15.2)

## 2019-01-08 LAB — LIPASE, BLOOD: Lipase: 35 U/L (ref 11–51)

## 2019-01-08 LAB — TROPONIN I: Troponin I: <0.03 ng/mL (ref <0.03)

## 2019-01-08 LAB — APTT: aPTT: 29 seconds (ref 24–36)

## 2019-01-08 MED ORDER — APIXABAN 5 MG PO TABS
5.0000 mg | ORAL_TABLET | Freq: Once | ORAL | Status: AC
  Administered 2019-01-08: 5 mg via ORAL
  Filled 2019-01-08: qty 1

## 2019-01-08 MED ORDER — DILTIAZEM HCL 25 MG/5ML IV SOLN
20.0000 mg | Freq: Once | INTRAVENOUS | Status: AC
  Administered 2019-01-08: 20 mg via INTRAVENOUS
  Filled 2019-01-08: qty 5

## 2019-01-08 MED ORDER — MAGNESIUM SULFATE 2 GM/50ML IV SOLN
2.0000 g | Freq: Once | INTRAVENOUS | Status: AC
  Administered 2019-01-08: 2 g via INTRAVENOUS
  Filled 2019-01-08: qty 50

## 2019-01-08 MED ORDER — SODIUM CHLORIDE 0.9 % IV BOLUS
1000.0000 mL | Freq: Once | INTRAVENOUS | Status: AC
  Administered 2019-01-08: 1000 mL via INTRAVENOUS

## 2019-01-08 MED ORDER — DILTIAZEM HCL ER COATED BEADS 180 MG PO CP24
180.0000 mg | ORAL_CAPSULE | Freq: Once | ORAL | Status: AC
  Administered 2019-01-08: 180 mg via ORAL
  Filled 2019-01-08: qty 1

## 2019-01-08 MED ORDER — DILTIAZEM HCL-DEXTROSE 100-5 MG/100ML-% IV SOLN (PREMIX)
5.0000 mg/h | INTRAVENOUS | Status: DC
  Administered 2019-01-08: 5 mg/h via INTRAVENOUS
  Filled 2019-01-08: qty 100

## 2019-01-08 MED ORDER — ONDANSETRON HCL 4 MG/2ML IJ SOLN
4.0000 mg | Freq: Once | INTRAMUSCULAR | Status: AC
  Administered 2019-01-08: 4 mg via INTRAVENOUS
  Filled 2019-01-08: qty 2

## 2019-01-08 NOTE — ED Notes (Signed)
Per EDP no adjustments to Cardizem dosage. Will continue to monitor.

## 2019-01-08 NOTE — ED Notes (Signed)
Patient alert and oriented. Small sip of water given. No sign or symptoms of nausea at this time.

## 2019-01-08 NOTE — H&P (Signed)
[facility] [place] NAME: Justin Leonard    MR#:  [id number]  DATE OF BIRTH:  1927-02-12  DATE OF ADMISSION:  01/08/2019  PRIMARY CARE PHYSICIAN: [name], MD   REQUESTING/REFERRING PHYSICIAN: Alfred Levins, MD  CHIEF COMPLAINT:   Chief Complaint  Patient presents with  . Emesis  . Diarrhea    HISTORY OF PRESENT ILLNESS:  Justin Leonard  is a 85 y.o. male who presents with chief complaint as above.  Patient presents with several days of malaise and palpitations.  Here in the ED tonight he is found to be flu negative, but he was found to be in A. fib with RVR.  This is a new diagnosis for him.  He received multiple doses of IV rate controlling medication in the ED, and then required a Cardizem drip.  Hospitalist were called for admission  PAST MEDICAL HISTORY:   Past Medical History:  Diagnosis Date  . Anemia   . Arthritis   . Back pain   . CHF (congestive heart failure) ([place])   . COPD (chronic obstructive pulmonary disease) ([place])   . Coronary artery disease   . Deafness   . Depression   . Diabetes mellitus without complication ([place])   . Dizziness    when gets up  . GERD (gastroesophageal reflux disease)   . Hyperlipidemia   . Hypertension   . Melena   . Myocardial infarction ([place])    maybe a small one  . Obesity   . Oxygen decrease    WEARS HS,PRN  . Peripheral neuropathy   . Peripheral neuropathy    bil.hands and feet  . Skin cancer   . Umbilical hernia      PAST SURGICAL HISTORY:   Past Surgical History:  Procedure Laterality Date  . CARDIAC CATHETERIZATION Left 07/17/2015   Procedure: Right/Left Heart Cath and Coronary Angiography;  Surgeon: Dionisio Dayne Chait, MD;  Location: [facility];  Service: Cardiovascular;  Laterality: Left;  . CARDIAC CATHETERIZATION N/A 07/17/2015   Procedure: Coronary Stent Intervention;  Surgeon: [name], MD;  Location: [facility];  Service:  Cardiovascular;  Laterality: N/A;  . CARDIAC CATHETERIZATION Right 05/10/2016   Procedure: Left Heart Cath and Coronary Angiography;  Surgeon: Dionisio Emerald Gehres, MD;  Location: [facility];  Service: Cardiovascular;  Laterality: Right;  . CHOLECYSTECTOMY N/A 11/04/2016   Procedure: LAPAROSCOPIC CHOLECYSTECTOMY, POSSIBLE OPEN umbilical hernia repair;  Surgeon: [name], MD;  Location: [facility];  Service: General;  Laterality: N/A;  . COLONOSCOPY WITH PROPOFOL N/A 12/12/2015   Procedure: COLONOSCOPY WITH PROPOFOL;  Surgeon: [name], MD;  Location: [facility];  Service: Gastroenterology;  Laterality: N/A;  . CORONARY ANGIOPLASTY     stent 8/16  . CORONARY ARTERY BYPASS GRAFT    . ESOPHAGOGASTRODUODENOSCOPY N/A 11/10/2015   Procedure: ESOPHAGOGASTRODUODENOSCOPY (EGD);  Surgeon: [name], MD;  Location: [facility] ENDOSCOPY;  Service: Endoscopy;  Laterality: N/A;  . EYE SURGERY    . gall bladder drain    . MOHS SURGERY       SOCIAL HISTORY:   Social History   Tobacco Use  . Smoking status: Former Smoker    Packs/day: 3.00    Years: 30.00    Pack years: 90.00    Types: Cigarettes    Last attempt to quit: 04/16/1978    Years since quitting: 40.7  . Smokeless tobacco: Never Used  . Tobacco comment: Quit smoking  1979  Substance Use Topics  . Alcohol use: No    Alcohol/week: 0.0 standard drinks     FAMILY HISTORY:   Family History  Problem Relation Age of Onset  . Hypertension Father   . CVA Father   . Anemia Neg Hx   . Arrhythmia Neg Hx   . Asthma Neg Hx   . Clotting disorder Neg Hx   . Fainting Neg Hx   . Heart attack Neg Hx   . Heart disease Neg Hx   . Heart failure Neg Hx   . Hyperlipidemia Neg Hx   . Prostate cancer Neg Hx   . Bladder Cancer Neg Hx   . Kidney cancer Neg Hx      DRUG ALLERGIES:  No Known Allergies  MEDICATIONS AT HOME:   Prior to Admission medications   Medication Sig Start Date End Date Taking? Authorizing Provider  acetaminophen  (TYLENOL) 650 MG CR tablet Take 650 mg by mouth 2 (two) times daily as needed for pain.     [provider]  aspirin EC 81 MG tablet Take 81 mg by mouth daily.    [provider]  clopidogrel (PLAVIX) 75 MG tablet Take 1 tablet (75 mg total) by mouth daily. 05/12/16   Vaughan Basta, MD  ferrous sulfate 325 (65 FE) MG tablet Take 325 mg by mouth 2 (two) times daily.     [provider]  FLUoxetine (PROZAC) 40 MG capsule Take 40 mg by mouth daily.  05/24/17   [provider]  furosemide (LASIX) 40 MG tablet Take 1 tablet (40 mg total) by mouth daily. 11/27/15   Carrie Mew, MD  HYDROcodone-acetaminophen (NORCO/VICODIN) 5-325 MG tablet  11/07/16   [provider]  insulin glargine (LANTUS) 100 UNIT/ML injection Inject 15-18 Units into the skin at bedtime.     [provider]  isosorbide mononitrate (IMDUR) 30 MG 24 hr tablet Take 1 tablet (30 mg total) by mouth daily. Patient not taking: Reported on 10/12/2018 09/22/16   Nicholes Mango, MD  lisinopril (PRINIVIL,ZESTRIL) 5 MG tablet Take 1 tablet (5 mg total) by mouth daily. Patient taking differently: Take 2.5 mg by mouth daily.  12/06/17   Vaughan Basta, MD  lovastatin (MEVACOR) 20 MG tablet Take 20 mg by mouth at bedtime.    [provider]  metFORMIN (GLUCOPHAGE) 500 MG tablet Take 500 mg by mouth 2 (two) times daily.     [provider]  metoprolol succinate (TOPROL-XL) 25 MG 24 hr tablet Take 25 mg by mouth daily.    [provider]  nitroGLYCERIN (NITROSTAT) 0.4 MG SL tablet Place 0.4 mg under the tongue every 5 (five) minutes as needed for chest pain.    [provider]  OXYGEN Place 2 L into the nose Nightly.    [provider]  pantoprazole (PROTONIX) 40 MG tablet Take 40 mg by mouth daily.  08/26/15   [provider]  Polyethylene Glycol 3350 (MIRALAX PO) Take 17 g by mouth daily.    [provider]   ranolazine (RANEXA) 500 MG 12 hr tablet Take 500 mg by mouth 2 (two) times daily.     [provider]  tamsulosin (FLOMAX) 0.4 MG CAPS capsule Take 1 capsule (0.4 mg total) by mouth daily after supper. 07/06/18   Zara Council A, PA-C  Tiotropium Bromide Monohydrate (SPIRIVA RESPIMAT) 2.5 MCG/ACT AERS Inhale 2 puffs into the lungs See admin instructions. Inhale 2 puffs 1 to 2 times a day.  [provider]  VENTOLIN HFA 108 (90 BASE) MCG/ACT inhaler Inhale 1-2 puffs into the lungs as needed for wheezing or shortness of breath. Reported on 12/16/2015 06/24/15   [provider]    REVIEW OF SYSTEMS:  Review of Systems  Constitutional: Positive for malaise/fatigue. Negative for chills, fever and weight loss.  HENT: Negative for ear pain, hearing loss and tinnitus.   Eyes: Negative for blurred vision, double vision, pain and redness.  Respiratory: Negative for cough, hemoptysis and shortness of breath.   Cardiovascular: Positive for palpitations. Negative for chest pain, orthopnea and leg swelling.  Gastrointestinal: Negative for abdominal pain, constipation, diarrhea, nausea and vomiting.  Genitourinary: Negative for dysuria, frequency and hematuria.  Musculoskeletal: Negative for back pain, joint pain and neck pain.  Skin:       No acne, rash, or lesions  Neurological: Negative for dizziness, tremors, focal weakness and weakness.  Endo/Heme/Allergies: Negative for polydipsia. Does not bruise/bleed easily.  Psychiatric/Behavioral: Negative for depression. The patient is not nervous/anxious and does not have insomnia.      VITAL SIGNS:   Vitals:   01/08/19 2300 01/08/19 2303 01/08/19 2304 01/08/19 2315  BP: (!) 134/56     Pulse: (!) 56 (!) 56 (!) 56 73  Resp: (!) 22 (!) 23 (!) 23 (!) 23  Temp:      TempSrc:      SpO2: 98% 97% 98% 97%  Weight:      Height:       Wt Readings from Last 3 Encounters:  01/08/19 98.9 kg  10/12/18 101.7 kg  07/06/18 97.5 kg     PHYSICAL EXAMINATION:  Physical Exam  Vitals reviewed. Constitutional: He is oriented to person, place, and time. He appears well-developed and well-nourished. No distress.  HENT:  Head: Normocephalic and atraumatic.  Mouth/Throat: Oropharynx is clear and moist.  Eyes: Pupils are equal, round, and reactive to light. Conjunctivae and EOM are normal. No scleral icterus.  Neck: Normal range of motion. Neck supple. No JVD present. No thyromegaly present.  Cardiovascular: Intact distal pulses. Exam reveals no gallop and no friction rub.  No murmur heard. Tachycardic, irregular rhythm  Respiratory: Effort normal and breath sounds normal. No respiratory distress. He has no wheezes. He has no rales.  GI: Soft. Bowel sounds are normal. He exhibits no distension. There is no abdominal tenderness.  Musculoskeletal: Normal range of motion.        General: No edema.     Comments: No arthritis, no gout  Lymphadenopathy:    He has no cervical adenopathy.  Neurological: He is alert and oriented to person, place, and time. No cranial nerve deficit.  No dysarthria, no aphasia  Skin: Skin is warm and dry. No rash noted. No erythema.  Psychiatric: He has a normal mood and affect. His behavior is normal. Judgment and thought content normal.    LABORATORY PANEL:   CBC Recent Labs  Lab 01/08/19 1739  WBC 13.4*  HGB 13.2  HCT 41.3  PLT 223   ------------------------------------------------------------------------------------------------------------------  Chemistries  Recent Labs  Lab 01/08/19 1739  NA 139  K 4.1  CL 105  CO2 24  GLUCOSE 181*  BUN 29*  CREATININE 1.34*  CALCIUM 9.5  MG 1.7  AST 55*  ALT 90*  ALKPHOS 108  BILITOT 0.6   ------------------------------------------------------------------------------------------------------------------  Cardiac Enzymes Recent Labs  Lab 01/08/19 1739  TROPONINI <0.03    ------------------------------------------------------------------------------------------------------------------  RADIOLOGY:  No results found.  EKG:   Orders placed or performed  during the hospital encounter of 01/08/19  . ED EKG  . ED EKG  . EKG 12-Lead  . EKG 12-Lead    IMPRESSION AND PLAN:  Principal Problem:   Atrial fibrillation with RVR ([name]) -patient is currently on diltiazem drip, will admit to telemetry floor, continue diltiazem drip, get an echocardiogram and a cardiology consult Active Problems:   Chronic diastolic heart failure ([place]) -continue home meds   HTN (hypertension) -home dose antihypertensives   Diabetes ([name]) -sliding scale insulin with corresponding glucose checks   COPD (chronic obstructive pulmonary disease) ([name]) -home dose inhalers   HLD (hyperlipidemia) -home dose antilipid   GERD (gastroesophageal reflux disease) -home dose PPI  Chart review performed and case discussed with ED provider. Labs, imaging and/or ECG reviewed by provider and discussed with patient/family. Management plans discussed with the patient and/or family.  DVT PROPHYLAXIS: Systemic anticoagulation  GI PROPHYLAXIS:  PPI   ADMISSION STATUS: Inpatient     CODE STATUS: Full Code Status History    Date Active Date Inactive Code Status Order ID Comments User Context   12/05/2017 0238 12/05/2017 2109 Full Code [id number]  Saundra Shelling, MD ED   11/04/2016 0919 11/05/2016 1511 Full Code [id number]  [name], MD Inpatient   09/21/2016 1605 09/22/2016 1806 Full Code [id number]  Lytle Butte, MD ED   05/09/2016 1617 05/12/2016 1629 Full Code [id number]  Idelle Crouch, MD Inpatient   11/05/2015 0638 11/14/2015 1939 Full Code [id number]  Harrie Foreman, MD Inpatient   07/17/2015 1614 07/18/2015 1639 Full Code [id number]  [name], MD Inpatient   07/17/2015 1541 07/17/2015 1614 Full Code [id number]  Dionisio Elgene Coral, MD Inpatient   07/15/2015 1704 07/17/2015 1541 Full Code  [id number]  Demetrios Loll, MD Inpatient    Advance Directive Documentation     Most Recent Value  Type of Advance Directive  Living will  Pre-existing out of facility DNR order (yellow form or pink MOST form)  -  "MOST" Form in Place?  -      TOTAL TIME TAKING CARE OF THIS PATIENT: 45 minutes.   Ethlyn Daniels 01/08/2019, 11:39 PM  [facility]  Office  [phone]  CC: Primary care physician; [name], MD  Note:  This document was prepared using Dragon voice recognition software and may include unintentional dictation errors.

## 2019-01-08 NOTE — ED Provider Notes (Signed)
[facility] Emergency Department Provider Note  ____________________________________________  Time seen: Approximately 5:57 PM  I have reviewed the triage vital signs and the nursing notes.   HISTORY  Chief Complaint Emesis and Diarrhea   HPI Justin Leonard is a 85 y.o. male with a history of CHF with preserved EF, COPD, CAD, diabetes, hypertension, hyperlipidemia who presents for evaluation of vomiting and diarrhea.  Patient reports that he had several visitors over the weekend to see his wife who is end-stage cancer.  Around lunchtime today started having vomiting and diarrhea which she thinks he caught from 1 of the visitors.  He also has had a cough for the last several weeks however that is also worse today.  He describes the emesis is green and the diarrhea is black but he does take iron supplements.  He denies abdominal pain, shortness of breath, fever or chills.  No chest pain.   Past Medical History:  Diagnosis Date  . Anemia   . Arthritis   . Back pain   . CHF (congestive heart failure) ([place])   . COPD (chronic obstructive pulmonary disease) ([place])   . Coronary artery disease   . Deafness   . Depression   . Diabetes mellitus without complication ([place])   . Dizziness    when gets up  . GERD (gastroesophageal reflux disease)   . Hyperlipidemia   . Hypertension   . Melena   . Myocardial infarction ([place])    maybe a small one  . Obesity   . Oxygen decrease    WEARS HS,PRN  . Peripheral neuropathy   . Peripheral neuropathy    bil.hands and feet  . Skin cancer   . Umbilical hernia     Patient Active Problem List   Diagnosis Date Noted  . Chest pain 12/05/2017  . Epigastric hernia   . PAH (pulmonary artery hypertension) ([place]) 10/13/2016  . Abdominal pain, bilateral upper quadrant 05/09/2016  . Diabetic hypoglycemia ([place]) 10/13/2015  . Chronic diastolic heart failure ([place]) 04/17/2015  . HTN (hypertension) 04/17/2015  . Diabetes ([place])  04/17/2015  . COPD (chronic obstructive pulmonary disease) ([place]) 04/17/2015  . Clinical depression 08/23/2012  . Melanoma in situ ([place]) 08/23/2012  . Exomphalos 08/23/2012  . Major depressive disorder with single episode 08/23/2012  . Difficulty hearing 01/18/2012  . HLD (hyperlipidemia) 01/18/2012  . Peripheral nerve disease 01/18/2012  . Adiposity 01/18/2012    Past Surgical History:  Procedure Laterality Date  . CARDIAC CATHETERIZATION Left 07/17/2015   Procedure: Right/Left Heart Cath and Coronary Angiography;  Surgeon: [name], MD;  Location: [facility];  Service: Cardiovascular;  Laterality: Left;  . CARDIAC CATHETERIZATION N/A 07/17/2015   Procedure: Coronary Stent Intervention;  Surgeon: [name], MD;  Location: [facility];  Service: Cardiovascular;  Laterality: N/A;  . CARDIAC CATHETERIZATION Right 05/10/2016   Procedure: Left Heart Cath and Coronary Angiography;  Surgeon: [name], MD;  Location: [facility];  Service: Cardiovascular;  Laterality: Right;  . CHOLECYSTECTOMY N/A 11/04/2016   Procedure: LAPAROSCOPIC CHOLECYSTECTOMY, POSSIBLE OPEN umbilical hernia repair;  Surgeon: [name], MD;  Location: [facility];  Service: General;  Laterality: N/A;  . COLONOSCOPY WITH PROPOFOL N/A 12/12/2015   Procedure: COLONOSCOPY WITH PROPOFOL;  Surgeon: [name], MD;  Location: [facility] ENDOSCOPY;  Service: Gastroenterology;  Laterality: N/A;  . CORONARY ANGIOPLASTY     stent 8/16  . CORONARY ARTERY BYPASS GRAFT    .  ESOPHAGOGASTRODUODENOSCOPY N/A 11/10/2015   Procedure: ESOPHAGOGASTRODUODENOSCOPY (EGD);  Surgeon: [name], MD;  Location: [facility] ENDOSCOPY;  Service: Endoscopy;  Laterality: N/A;  . EYE SURGERY    . gall bladder drain    . MOHS SURGERY      Prior to Admission medications   Medication Sig Start Date End Date Taking? Authorizing Provider  acetaminophen (TYLENOL) 650 MG CR tablet Take 650 mg by mouth 2 (two) times daily as  needed for pain.     [provider]  aspirin EC 81 MG tablet Take 81 mg by mouth daily.    [provider]  clopidogrel (PLAVIX) 75 MG tablet Take 1 tablet (75 mg total) by mouth daily. 05/12/16   [place] Basta, MD  ferrous sulfate 325 (65 FE) MG tablet Take 325 mg by mouth 2 (two) times daily.     [provider]  FLUoxetine (PROZAC) 40 MG capsule Take 40mg  by mouth every morning 05/24/17   [provider]  furosemide (LASIX) 40 MG tablet Take 1 tablet (40 mg total) by mouth daily. 11/27/15   Carrie Mew, MD  HYDROcodone-acetaminophen (NORCO/VICODIN) 5-325 MG tablet  11/07/16   [provider]  insulin glargine (LANTUS) 100 UNIT/ML injection Inject 15-18 Units into the skin at bedtime.     [provider]  isosorbide mononitrate (IMDUR) 30 MG 24 hr tablet Take 1 tablet (30 mg total) by mouth daily. Patient not taking: Reported on 10/12/2018 09/22/16   Nicholes Mango, MD  lisinopril (PRINIVIL,ZESTRIL) 5 MG tablet Take 1 tablet (5 mg total) by mouth daily. Patient taking differently: Take 2.5 mg by mouth daily.  12/06/17   [place] Basta, MD  lovastatin (MEVACOR) 20 MG tablet Take 20 mg by mouth at bedtime.    [provider]  metFORMIN (GLUCOPHAGE) 500 MG tablet Take 500 mg by mouth 2 (two) times daily.     [provider]  metoprolol succinate (TOPROL-XL) 25 MG 24 hr tablet Take 25 mg by mouth daily.    [provider]  nitroGLYCERIN (NITROSTAT) 0.4 MG SL tablet Place 0.4 mg under the tongue every 5 (five) minutes as needed for chest pain.    [provider]  OXYGEN Place 2 L into the nose Nightly.    [provider]  pantoprazole (PROTONIX) 40 MG tablet Take 40 mg by mouth daily.  08/26/15   [provider]  Polyethylene Glycol 3350 (MIRALAX PO) Take 17 g by mouth daily.    [provider]  ranolazine (RANEXA) 500 MG 12 hr tablet Take 500 mg by mouth 2 (two) times  daily.     [provider]  tamsulosin (FLOMAX) 0.4 MG CAPS capsule Take 1 capsule (0.4 mg total) by mouth daily after supper. 07/06/18   Zara Council A, PA-C  Tiotropium Bromide Monohydrate (SPIRIVA RESPIMAT) 2.5 MCG/ACT AERS Inhale 2 puffs into the lungs See admin instructions. Inhale 2 puffs 1 to 2 times a day.    [provider]  VENTOLIN HFA 108 (90 BASE) MCG/ACT inhaler Inhale 1-2 puffs into the lungs as needed for wheezing or shortness of breath. Reported on 12/16/2015 06/24/15   [provider]    Allergies Patient has no known allergies.  Family History  Problem Relation Age of Onset  . Hypertension Father   . CVA Father   . Anemia Neg Hx   . Arrhythmia Neg Hx   . Asthma Neg Hx   . Clotting disorder Neg Hx   . Fainting Neg  Hx   . Heart attack Neg Hx   . Heart disease Neg Hx   . Heart failure Neg Hx   . Hyperlipidemia Neg Hx   . Prostate cancer Neg Hx   . Bladder Cancer Neg Hx   . Kidney cancer Neg Hx     Social History Social History   Tobacco Use  . Smoking status: Former Smoker    Packs/day: 3.00    Years: 30.00    Pack years: 90.00    Types: Cigarettes    Last attempt to quit: 04/16/1978    Years since quitting: 40.7  . Smokeless tobacco: Never Used  . Tobacco comment: Quit smoking 1979  Substance Use Topics  . Alcohol use: No    Alcohol/week: 0.0 standard drinks  . Drug use: No    Review of Systems  Constitutional: Negative for fever. Eyes: Negative for visual changes. ENT: Negative for sore throat. Neck: No neck pain  Cardiovascular: Negative for chest pain. Respiratory: Negative for shortness of breath. + cough Gastrointestinal: Negative for abdominal pain. + vomiting and diarrhea. Genitourinary: Negative for dysuria. Musculoskeletal: Negative for back pain. Skin: Negative for rash. Neurological: Negative for headaches, weakness or numbness. Psych: No SI or  HI  ____________________________________________   PHYSICAL EXAM:  VITAL SIGNS: ED Triage Vitals  Enc Vitals Group     BP 01/08/19 1718 (!) 147/92     Pulse Rate 01/08/19 1718 (!) 48     Resp 01/08/19 1718 18     Temp 01/08/19 1718 97.7 F (36.5 C)     Temp Source 01/08/19 1718 Oral     SpO2 01/08/19 1718 93 %     Weight 01/08/19 1721 218 lb (98.9 kg)     Height 01/08/19 1721 6\' 2"  (1.88 m)     Head Circumference --      Peak Flow --      Pain Score 01/08/19 1721 0     Pain Loc --      Pain Edu? --      Excl. in [place]? --     Constitutional: Alert and oriented. Well appearing and in no apparent distress. HEENT:      Head: Normocephalic and atraumatic.         Eyes: Conjunctivae are normal. Sclera is non-icteric.       Mouth/Throat: Mucous membranes are moist.       Neck: Supple with no signs of meningismus. Cardiovascular: Regular rate and rhythm. No murmurs, gallops, or rubs. 2+ symmetrical distal pulses are present in all extremities. No JVD. Respiratory: Normal respiratory effort. Lungs are clear to auscultation bilaterally. No wheezes, crackles, or rhonchi.  Gastrointestinal: Soft, non tender, and non distended with positive bowel sounds. No rebound or guarding. Genitourinary: Rectal exam showing black stool guaiac negative Musculoskeletal: Nontender with normal range of motion in all extremities. No edema, cyanosis, or erythema of extremities. Neurologic: Normal speech and language. Face is symmetric. Moving all extremities. No gross focal neurologic deficits are appreciated. Skin: Skin is warm, dry and intact. No rash noted. Psychiatric: Mood and affect are normal. Speech and behavior are normal.  ____________________________________________   LABS (all labs ordered are listed, but only abnormal results are displayed)  Labs Reviewed  COMPREHENSIVE METABOLIC PANEL - Abnormal; Notable for the following components:      Result Value   Glucose, Bld 181 (*)    BUN 29  (*)    Creatinine, Ser 1.34 (*)    AST 55 (*)  ALT 90 (*)    GFR calc non Af Amer 47 (*)    GFR calc Af Amer 54 (*)    All other components within normal limits  CBC - Abnormal; Notable for the following components:   WBC 13.4 (*)    All other components within normal limits  URINALYSIS, COMPLETE (UACMP) WITH MICROSCOPIC - Abnormal; Notable for the following components:   Color, Urine YELLOW (*)    APPearance CLEAR (*)    Ketones, ur 5 (*)    Protein, ur 100 (*)    Bacteria, UA RARE (*)    All other components within normal limits  LIPASE, BLOOD  PROTIME-INR  APTT  INFLUENZA PANEL BY PCR (TYPE A & B)  MAGNESIUM  TROPONIN I   ____________________________________________  EKG  ED ECG REPORT I, Rudene Re, the attending physician, personally viewed and interpreted this ECG.  Atrial fibrillation, rate of 148, prolonged QTC, normal axis, diffuse ST depressions with no ST elevation.  A. fib is new when compared to prior. ____________________________________________  RADIOLOGY  I have personally reviewed the images performed during this visit and I agree with the Radiologist's read.   Interpretation by Radiologist:  No results found.    ____________________________________________   PROCEDURES  Procedure(s) performed: None Procedures Critical Care performed: yes  CRITICAL CARE Performed by: Rudene Re  ?  Total critical care time: 40 min  Critical care time was exclusive of separately billable procedures and treating other patients.  Critical care was necessary to treat or prevent imminent or life-threatening deterioration.  Critical care was time spent personally by me on the following activities: development of treatment plan with patient and/or surrogate as well as nursing, discussions with consultants, evaluation of patient's response to treatment, examination of patient, obtaining history from patient or surrogate, ordering and performing  treatments and interventions, ordering and review of laboratory studies, ordering and review of radiographic studies, pulse oximetry and re-evaluation of patient's condition.  ____________________________________________   INITIAL IMPRESSION / ASSESSMENT AND PLAN / ED COURSE  85 y.o. male with a history of CHF with preserved EF, COPD, CAD, diabetes, hypertension, hyperlipidemia who presents for evaluation of vomiting, diarrhea, and cough since lunch today.  Normal work of breathing, normal oxygen saturation, lungs are clear to auscultation.  Patient it is in A. fib with RVR which is new for him.  Most likely due to dehydration plus or minus electrolyte abnormalities in the setting of vomiting and diarrhea.  Will give IV fluids, Zofran and magnesium.  We will hold off on rate controlling agents at this time until patient's preload is optimized.  Will check for flu.  Chest x-ray to rule out pneumonia.  Labs to rule out dehydration, AKI, electrolyte abnormalities.    _________________________ 10:18 PM on 01/08/2019 -----------------------------------------  Labs with no significant findings.  Influenza negative.  Patient with no further episodes of vomiting or diarrhea in the emergency room.  After liter of normal saline, 20 mg of Cardizem x2 and 180 mg of extended release p.o. Cardizem patient's heart rate still bouncing from 90s to the 130s therefore will admit patient.  Patient was started on Cardizem drip.   As part of my medical decision making, I reviewed the following data within the [place] notes reviewed and incorporated, Labs reviewed , EKG interpreted , Old EKG reviewed, Old chart reviewed, Radiograph reviewed , Discussed with admitting physician , Notes from prior ED visits and [place] Controlled Substance Database    Pertinent labs &  imaging results that were available during my care of the patient were reviewed by me and considered in my medical decision making  (see chart for details).    ____________________________________________   FINAL CLINICAL IMPRESSION(S) / ED DIAGNOSES  Final diagnoses:  Atrial fibrillation with RVR ([name])  Dehydration  Nausea vomiting and diarrhea      NEW MEDICATIONS STARTED DURING THIS VISIT:  ED Discharge Orders    None       Note:  This document was prepared using Dragon voice recognition software and may include unintentional dictation errors.    Rudene Re, MD 01/08/19 2218

## 2019-01-08 NOTE — ED Triage Notes (Addendum)
Patient reports since waking up this AM he has had 4 episodes of diarrhea and emesis around 5 times today. Denies abd pain. Abd pain not tender in triage. HX COPD and CHF

## 2019-01-09 ENCOUNTER — Inpatient Hospital Stay
Admit: 2019-01-09 | Discharge: 2019-01-09 | Disposition: A | Discharge status: Home / Self Care | Payer: Medicare Other | Attending: Cardiovascular Disease | Admitting: Cardiovascular Disease

## 2019-01-09 ENCOUNTER — Encounter: Payer: Self-pay | Admitting: *Deleted

## 2019-01-09 LAB — CBC
HCT: 35.4 % — ABNORMAL LOW (ref 39.0–52.0)
Hemoglobin: 11.5 g/dL — ABNORMAL LOW (ref 13.0–17.0)
MCH: 31.2 pg (ref 26.0–34.0)
MCHC: 32.5 g/dL (ref 30.0–36.0)
MCV: 95.9 fL (ref 80.0–100.0)
Platelets: 171 10*3/uL (ref 150–400)
RBC: 3.69 MIL/uL — ABNORMAL LOW (ref 4.22–5.81)
RDW: 13.4 % (ref 11.5–15.5)
WBC: 14.1 10*3/uL — ABNORMAL HIGH (ref 4.0–10.5)
nRBC: 0 % (ref 0.0–0.2)

## 2019-01-09 LAB — ECHOCARDIOGRAM COMPLETE
Height: 74 in
Weight: 3502.4 oz

## 2019-01-09 LAB — BASIC METABOLIC PANEL
Anion gap: 6 (ref 5–15)
BUN: 34 mg/dL — ABNORMAL HIGH (ref 8–23)
CO2: 25 mmol/L (ref 22–32)
Calcium: 8.8 mg/dL — ABNORMAL LOW (ref 8.9–10.3)
Chloride: 108 mmol/L (ref 98–111)
Creatinine, Ser: 1.58 mg/dL — ABNORMAL HIGH (ref 0.61–1.24)
GFR calc non Af Amer: 38 mL/min — ABNORMAL LOW (ref >60)
GFR, EST AFRICAN AMERICAN: 44 mL/min — AB (ref >60)
Glucose, Bld: 198 mg/dL — ABNORMAL HIGH (ref 70–99)
Potassium: 4.2 mmol/L (ref 3.5–5.1)
Sodium: 139 mmol/L (ref 135–145)

## 2019-01-09 LAB — GLUCOSE, CAPILLARY
GLUCOSE-CAPILLARY: 118 mg/dL — AB (ref 70–99)
Glucose-Capillary: 199 mg/dL — ABNORMAL HIGH (ref 70–99)
Glucose-Capillary: 145 mg/dL — ABNORMAL HIGH (ref 70–99)
Glucose-Capillary: 175 mg/dL — ABNORMAL HIGH (ref 70–99)
Glucose-Capillary: 150 mg/dL — ABNORMAL HIGH (ref 70–99)

## 2019-01-09 MED ORDER — APIXABAN 2.5 MG PO TABS
2.5000 mg | ORAL_TABLET | Freq: Two times a day (BID) | ORAL | Status: DC
  Administered 2019-01-09 – 2019-01-10 (×2): 2.5 mg via ORAL
  Filled 2019-01-09 (×2): qty 1

## 2019-01-09 MED ORDER — RANOLAZINE ER 500 MG PO TB12
500.0000 mg | ORAL_TABLET | Freq: Two times a day (BID) | ORAL | Status: DC
  Filled 2019-01-09: qty 1

## 2019-01-09 MED ORDER — ACETAMINOPHEN 325 MG PO TABS
650.0000 mg | ORAL_TABLET | Freq: Four times a day (QID) | ORAL | Status: DC | PRN
  Administered 2019-01-09 (×2): 650 mg via ORAL
  Filled 2019-01-09 (×2): qty 2

## 2019-01-09 MED ORDER — PRAVASTATIN SODIUM 20 MG PO TABS: 20.0000 mg | ORAL_TABLET | Freq: Every day | ORAL | Status: DC

## 2019-01-09 MED ORDER — AMIODARONE HCL IN DEXTROSE 360-4.14 MG/200ML-% IV SOLN
60.0000 mg/h | INTRAVENOUS | Status: DC
  Administered 2019-01-09: 60 mg/h via INTRAVENOUS
  Filled 2019-01-09: qty 200

## 2019-01-09 MED ORDER — PANTOPRAZOLE SODIUM 40 MG PO TBEC
40.0000 mg | DELAYED_RELEASE_TABLET | Freq: Every day | ORAL | Status: DC
  Administered 2019-01-09 – 2019-01-10 (×2): 40 mg via ORAL
  Filled 2019-01-09 (×2): qty 1

## 2019-01-09 MED ORDER — PRAVASTATIN SODIUM 20 MG PO TABS
20.0000 mg | ORAL_TABLET | Freq: Every day | ORAL | Status: DC
  Administered 2019-01-09 (×2): 20 mg via ORAL
  Filled 2019-01-09 (×2): qty 1

## 2019-01-09 MED ORDER — AMIODARONE HCL IN DEXTROSE 360-4.14 MG/200ML-% IV SOLN
30.0000 mg/h | INTRAVENOUS | Status: DC
  Administered 2019-01-09 – 2019-01-10 (×2): 30 mg/h via INTRAVENOUS
  Filled 2019-01-09 (×2): qty 200

## 2019-01-09 MED ORDER — INSULIN ASPART 100 UNIT/ML ~~LOC~~ SOLN
0.0000 [IU] | Freq: Three times a day (TID) | SUBCUTANEOUS | Status: DC
  Administered 2019-01-09: 2 [IU] via SUBCUTANEOUS
  Administered 2019-01-09 – 2019-01-10 (×3): 1 [IU] via SUBCUTANEOUS
  Filled 2019-01-09 (×4): qty 1

## 2019-01-09 MED ORDER — ONDANSETRON HCL 4 MG PO TABS: 4.0000 mg | ORAL_TABLET | Freq: Four times a day (QID) | ORAL | Status: DC | PRN

## 2019-01-09 MED ORDER — RANOLAZINE ER 500 MG PO TB12
500.0000 mg | ORAL_TABLET | Freq: Two times a day (BID) | ORAL | Status: DC
  Administered 2019-01-09 – 2019-01-10 (×4): 500 mg via ORAL
  Filled 2019-01-09 (×5): qty 1

## 2019-01-09 MED ORDER — TAMSULOSIN HCL 0.4 MG PO CAPS
0.4000 mg | ORAL_CAPSULE | Freq: Every day | ORAL | Status: DC
  Administered 2019-01-09 (×2): 0.4 mg via ORAL
  Filled 2019-01-09 (×2): qty 1

## 2019-01-09 MED ORDER — FLUOXETINE HCL 20 MG PO CAPS
40.0000 mg | ORAL_CAPSULE | Freq: Every day | ORAL | Status: DC
  Administered 2019-01-09: 20 mg via ORAL
  Administered 2019-01-10: 40 mg via ORAL
  Filled 2019-01-09 (×2): qty 2

## 2019-01-09 MED ORDER — APIXABAN 5 MG PO TABS
5.0000 mg | ORAL_TABLET | Freq: Two times a day (BID) | ORAL | Status: DC
  Administered 2019-01-09: 5 mg via ORAL
  Filled 2019-01-09: qty 1

## 2019-01-09 MED ORDER — ONDANSETRON HCL 4 MG/2ML IJ SOLN: 4.0000 mg | Freq: Four times a day (QID) | INTRAMUSCULAR | Status: DC | PRN

## 2019-01-09 MED ORDER — ORAL CARE MOUTH RINSE
15.0000 mL | Freq: Two times a day (BID) | OROMUCOSAL | Status: DC
  Administered 2019-01-10: 15 mL via OROMUCOSAL

## 2019-01-09 MED ORDER — TIOTROPIUM BROMIDE MONOHYDRATE 18 MCG IN CAPS
18.0000 ug | ORAL_CAPSULE | Freq: Every day | RESPIRATORY_TRACT | Status: DC
  Administered 2019-01-09 – 2019-01-10 (×2): 18 ug via RESPIRATORY_TRACT
  Filled 2019-01-09: qty 5

## 2019-01-09 MED ORDER — INSULIN ASPART 100 UNIT/ML ~~LOC~~ SOLN: 0.0000 [IU] | Freq: Every day | SUBCUTANEOUS | Status: DC

## 2019-01-09 MED ORDER — ACETAMINOPHEN 650 MG RE SUPP: 650.0000 mg | Freq: Four times a day (QID) | RECTAL | Status: DC | PRN

## 2019-01-09 MED ORDER — CLOPIDOGREL BISULFATE 75 MG PO TABS: 75.0000 mg | ORAL_TABLET | Freq: Every day | ORAL | Status: DC

## 2019-01-09 MED ORDER — ASPIRIN EC 81 MG PO TBEC
81.0000 mg | DELAYED_RELEASE_TABLET | Freq: Every day | ORAL | Status: DC
  Administered 2019-01-09 – 2019-01-10 (×2): 81 mg via ORAL
  Filled 2019-01-09 (×2): qty 1

## 2019-01-09 NOTE — Consult Note (Signed)
Justin Leonard is a 85 y.o. male  [id number]  Primary Cardiologist: [name] Reason for Consultation: Atrial fibrillation  HPI: This is a 85 year old white male with a history of CABG diabetes was seen in my office on December 18, 2018 with palpitation and was placed on metoprolol and EKG at that time showed sinus rhythm 85 bpm with right bundle branch block.  Patient had gastroenteritis and developed palpitation and tingling sensation in his fingers and was found to be in atrial fibrillation with rapid ventricular response rate and was admitted to the hospital.   Review of Systems: No chest pain but does feel short of breath and no orthopnea PND and leg swelling   Past Medical History:  Diagnosis Date  . Anemia   . Arthritis   . Back pain   . CHF (congestive heart failure) ([place])   . COPD (chronic obstructive pulmonary disease) ([place])   . Coronary artery disease   . Deafness   . Depression   . Diabetes mellitus without complication ([place])   . Dizziness    when gets up  . GERD (gastroesophageal reflux disease)   . Hyperlipidemia   . Hypertension   . Melena   . Myocardial infarction ([place])    maybe a small one  . Obesity   . Oxygen decrease    WEARS HS,PRN  . Peripheral neuropathy   . Peripheral neuropathy    bil.hands and feet  . Skin cancer   . Umbilical hernia     Medications Prior to Admission  Medication Sig Dispense Refill  . acetaminophen (TYLENOL) 650 MG CR tablet Take 650 mg by mouth 2 (two) times daily as needed for pain.     Marland Kitchen aspirin EC 81 MG tablet Take 81 mg by mouth daily.    . clopidogrel (PLAVIX) 75 MG tablet Take 1 tablet (75 mg total) by mouth daily. 30 tablet 0  . ferrous sulfate 325 (65 FE) MG tablet Take 325 mg by mouth 2 (two) times daily.     Marland Kitchen FLUoxetine (PROZAC) 40 MG capsule Take 40 mg by mouth daily.     . furosemide (LASIX) 40 MG tablet Take 1 tablet (40 mg total) by mouth daily. 10 tablet 0  . HYDROcodone-acetaminophen (NORCO/VICODIN)  5-325 MG tablet     . insulin glargine (LANTUS) 100 UNIT/ML injection Inject 15-18 Units into the skin at bedtime.     . isosorbide mononitrate (IMDUR) 30 MG 24 hr tablet Take 1 tablet (30 mg total) by mouth daily. (Patient not taking: Reported on 10/12/2018) 30 tablet 0  . lisinopril (PRINIVIL,ZESTRIL) 5 MG tablet Take 1 tablet (5 mg total) by mouth daily. (Patient taking differently: Take 2.5 mg by mouth daily. ) 30 tablet 0  . lovastatin (MEVACOR) 20 MG tablet Take 20 mg by mouth at bedtime.    . metFORMIN (GLUCOPHAGE) 500 MG tablet Take 500 mg by mouth 2 (two) times daily.     . metoprolol succinate (TOPROL-XL) 25 MG 24 hr tablet Take 25 mg by mouth daily.    . nitroGLYCERIN (NITROSTAT) 0.4 MG SL tablet Place 0.4 mg under the tongue every 5 (five) minutes as needed for chest pain.    . OXYGEN Place 2 L into the nose Nightly.    . pantoprazole (PROTONIX) 40 MG tablet Take 40 mg by mouth daily.     . Polyethylene Glycol 3350 (MIRALAX PO) Take 17 g by mouth daily.    . ranolazine (RANEXA) 500 MG 12 hr  tablet Take 500 mg by mouth 2 (two) times daily.     . tamsulosin (FLOMAX) 0.4 MG CAPS capsule Take 1 capsule (0.4 mg total) by mouth daily after supper. 90 capsule 3  . Tiotropium Bromide Monohydrate (SPIRIVA RESPIMAT) 2.5 MCG/ACT AERS Inhale 2 puffs into the lungs See admin instructions. Inhale 2 puffs 1 to 2 times a day.    . VENTOLIN HFA 108 (90 BASE) MCG/ACT inhaler Inhale 1-2 puffs into the lungs as needed for wheezing or shortness of breath. Reported on 12/16/2015       . apixaban  5 mg Oral BID  . aspirin EC  81 mg Oral Daily  . FLUoxetine  40 mg Oral Daily  . insulin aspart  0-5 Units Subcutaneous QHS  . insulin aspart  0-9 Units Subcutaneous TID WC  . mouth rinse  15 mL Mouth Rinse BID  . pantoprazole  40 mg Oral Daily  . pravastatin  20 mg Oral q1800  . ranolazine  500 mg Oral BID  . tamsulosin  0.4 mg Oral QPC supper  . tiotropium  18 mcg Inhalation Daily    Infusions: .  diltiazem (CARDIZEM) infusion 5 mg/hr (01/09/19 0700)    No Known Allergies  Social History   Socioeconomic History  . Marital status: Married    Spouse name: Not on file  . Number of children: Not on file  . Years of education: Not on file  . Highest education level: Not on file  Occupational History  . Not on file  Social Needs  . Financial resource strain: Not on file  . Food insecurity:    Worry: Not on file    Inability: Not on file  . Transportation needs:    Medical: Not on file    Non-medical: Not on file  Tobacco Use  . Smoking status: Former Smoker    Packs/day: 3.00    Years: 30.00    Pack years: 90.00    Types: Cigarettes    Last attempt to quit: 04/16/1978    Years since quitting: 40.7  . Smokeless tobacco: Never Used  . Tobacco comment: Quit smoking 1979  Substance and Sexual Activity  . Alcohol use: No    Alcohol/week: 0.0 standard drinks  . Drug use: No  . Sexual activity: Not Currently  Lifestyle  . Physical activity:    Days per week: Not on file    Minutes per session: Not on file  . Stress: Not on file  Relationships  . Social connections:    Talks on phone: Not on file    Gets together: Not on file    Attends religious service: Not on file    Active member of club or organization: Not on file    Attends meetings of clubs or organizations: Not on file    Relationship status: Not on file  . Intimate partner violence:    Fear of current or ex partner: Not on file    Emotionally abused: Not on file    Physically abused: Not on file    Forced sexual activity: Not on file  Other Topics Concern  . Not on file  Social History Narrative  . Not on file    Family History  Problem Relation Age of Onset  . Hypertension Father   . CVA Father   . Anemia Neg Hx   . Arrhythmia Neg Hx   . Asthma Neg Hx   . Clotting disorder Neg Hx   . Fainting Neg Hx   .  Heart attack Neg Hx   . Heart disease Neg Hx   . Heart failure Neg Hx   . Hyperlipidemia  Neg Hx   . Prostate cancer Neg Hx   . Bladder Cancer Neg Hx   . Kidney cancer Neg Hx     PHYSICAL EXAM: Vitals:   01/09/19 0625 01/09/19 0800  BP: (!) 101/57 (!) 125/58  Pulse: 79 83  Resp: 18 18  Temp:  98.2 F (36.8 C)  SpO2: 91% 94%     Intake/Output Summary (Last 24 hours) at 01/09/2019 1004 Last data filed at 01/09/2019 0700 Gross per 24 hour  Intake 39.76 ml  Output 100 ml  Net -60.24 ml    General:  Well appearing. No respiratory difficulty HEENT: normal Neck: supple. no JVD. Carotids 2+ bilat; no bruits. No lymphadenopathy or thryomegaly appreciated. Cor: PMI nondisplaced. Regular rate & rhythm. No rubs, gallops or murmurs. Lungs: clear Abdomen: soft, nontender, nondistended. No hepatosplenomegaly. No bruits or masses. Good bowel sounds. Extremities: no cyanosis, clubbing, rash, edema Neuro: alert & oriented x 3, cranial nerves grossly intact. moves all 4 extremities w/o difficulty. Affect pleasant.  ECG: Atrial fibrillation with rapid ventricular response rate with right bundle branch block and nonspecific ST-T changes  Results for orders placed or performed during the hospital encounter of 01/08/19 (from the past 24 hour(s))  Lipase, blood     Status: None   Collection Time: 01/08/19  5:39 PM  Result Value Ref Range   Lipase 35 11 - 51 U/L  Comprehensive metabolic panel     Status: Abnormal   Collection Time: 01/08/19  5:39 PM  Result Value Ref Range   Sodium 139 135 - 145 mmol/L   Potassium 4.1 3.5 - 5.1 mmol/L   Chloride 105 98 - 111 mmol/L   CO2 24 22 - 32 mmol/L   Glucose, Bld 181 (H) 70 - 99 mg/dL   BUN 29 (H) 8 - 23 mg/dL   Creatinine, Ser 1.34 (H) 0.61 - 1.24 mg/dL   Calcium 9.5 8.9 - 10.3 mg/dL   Total Protein 7.1 6.5 - 8.1 g/dL   Albumin 4.0 3.5 - 5.0 g/dL   AST 55 (H) 15 - 41 U/L   ALT 90 (H) 0 - 44 U/L   Alkaline Phosphatase 108 38 - 126 U/L   Total Bilirubin 0.6 0.3 - 1.2 mg/dL   GFR calc non Af Amer 47 (L) >60 mL/min   GFR calc Af Amer  54 (L) >60 mL/min   Anion gap 10 5 - 15  CBC     Status: Abnormal   Collection Time: 01/08/19  5:39 PM  Result Value Ref Range   WBC 13.4 (H) 4.0 - 10.5 K/uL   RBC 4.32 4.22 - 5.81 MIL/uL   Hemoglobin 13.2 13.0 - 17.0 g/dL   HCT 41.3 39.0 - 52.0 %   MCV 95.6 80.0 - 100.0 fL   MCH 30.6 26.0 - 34.0 pg   MCHC 32.0 30.0 - 36.0 g/dL   RDW 13.2 11.5 - 15.5 %   Platelets 223 150 - 400 K/uL   nRBC 0.0 0.0 - 0.2 %  Protime-INR     Status: None   Collection Time: 01/08/19  5:39 PM  Result Value Ref Range   Prothrombin Time 12.8 11.4 - 15.2 seconds   INR 0.97   APTT     Status: None   Collection Time: 01/08/19  5:39 PM  Result Value Ref Range   aPTT 29 24 -  36 seconds  Magnesium     Status: None   Collection Time: 01/08/19  5:39 PM  Result Value Ref Range   Magnesium 1.7 1.7 - 2.4 mg/dL  Troponin I - Add-On to previous collection     Status: None   Collection Time: 01/08/19  5:39 PM  Result Value Ref Range   Troponin I <0.03 <0.03 ng/mL  Urinalysis, Complete w Microscopic     Status: Abnormal   Collection Time: 01/08/19  6:24 PM  Result Value Ref Range   Color, Urine YELLOW (A) YELLOW   APPearance CLEAR (A) CLEAR   Specific Gravity, Urine 1.018 1.005 - 1.030   pH 5.0 5.0 - 8.0   Glucose, UA NEGATIVE NEGATIVE mg/dL   Hgb urine dipstick NEGATIVE NEGATIVE   Bilirubin Urine NEGATIVE NEGATIVE   Ketones, ur 5 (A) NEGATIVE mg/dL   Protein, ur 100 (A) NEGATIVE mg/dL   Nitrite NEGATIVE NEGATIVE   Leukocytes, UA NEGATIVE NEGATIVE   RBC / HPF 0-5 0 - 5 RBC/hpf   WBC, UA 0-5 0 - 5 WBC/hpf   Bacteria, UA RARE (A) NONE SEEN   Squamous Epithelial / LPF 0-5 0 - 5   Mucus PRESENT    Hyaline Casts, UA PRESENT   Influenza panel by PCR (type A & B)     Status: None   Collection Time: 01/08/19  6:24 PM  Result Value Ref Range   Influenza A By PCR NEGATIVE NEGATIVE   Influenza B By PCR NEGATIVE NEGATIVE  Glucose, capillary     Status: Abnormal   Collection Time: 01/09/19  1:03 AM  Result  Value Ref Range   Glucose-Capillary 199 (H) 70 - 99 mg/dL  Basic metabolic panel     Status: Abnormal   Collection Time: 01/09/19  4:50 AM  Result Value Ref Range   Sodium 139 135 - 145 mmol/L   Potassium 4.2 3.5 - 5.1 mmol/L   Chloride 108 98 - 111 mmol/L   CO2 25 22 - 32 mmol/L   Glucose, Bld 198 (H) 70 - 99 mg/dL   BUN 34 (H) 8 - 23 mg/dL   Creatinine, Ser 1.58 (H) 0.61 - 1.24 mg/dL   Calcium 8.8 (L) 8.9 - 10.3 mg/dL   GFR calc non Af Amer 38 (L) >60 mL/min   GFR calc Af Amer 44 (L) >60 mL/min   Anion gap 6 5 - 15  CBC     Status: Abnormal   Collection Time: 01/09/19  4:50 AM  Result Value Ref Range   WBC 14.1 (H) 4.0 - 10.5 K/uL   RBC 3.69 (L) 4.22 - 5.81 MIL/uL   Hemoglobin 11.5 (L) 13.0 - 17.0 g/dL   HCT 35.4 (L) 39.0 - 52.0 %   MCV 95.9 80.0 - 100.0 fL   MCH 31.2 26.0 - 34.0 pg   MCHC 32.5 30.0 - 36.0 g/dL   RDW 13.4 11.5 - 15.5 %   Platelets 171 150 - 400 K/uL   nRBC 0.0 0.0 - 0.2 %  Glucose, capillary     Status: Abnormal   Collection Time: 01/09/19  7:57 AM  Result Value Ref Range   Glucose-Capillary 145 (H) 70 - 99 mg/dL   No results found.   ASSESSMENT AND PLAN: Atrial fibrillation with rapid ventricular response rate which is new onset as had EKG on December 18, 2018 which showed sinus rhythm and was placed on metoprolol.  Patient currently is in atrial fibrillation with ventricular rate about 85 on Cardizem drip.  Advised changing the Cardizem to IV amiodarone with follow-up echocardiogram to further evaluate ejection fraction.  Destanee Bedonie A

## 2019-01-09 NOTE — Plan of Care (Signed)
  Problem: Activity: Goal: Risk for activity intolerance will decrease Outcome: Progressing Note:  Up with 1 assist with urinal and cane   Problem: Elimination: Goal: Will not experience complications related to urinary retention Outcome: Progressing   Problem: Pain Managment: Goal: General experience of comfort will improve Outcome: Progressing Note:  Received tylenol once for arthritis pain in his back & shoudlers   Problem: Safety: Goal: Ability to remain free from injury will improve Outcome: Progressing   Problem: Skin Integrity: Goal: Risk for impaired skin integrity will decrease Outcome: Progressing   Problem: Cardiac: Goal: Ability to achieve and maintain adequate cardiopulmonary perfusion will improve Outcome: Progressing Note:  Pt continues on cardizem gtt @ 5mg /hr rate is not controlled but still afib. Cardiology to see pt today. Echo also ordered

## 2019-01-09 NOTE — Progress Notes (Signed)
[facility] NAME: Justin Leonard    MR#:  [id number]  DATE OF BIRTH:  05/17/1927  SUBJECTIVE:   Patient admitted to the hospital secondary to symptoms of gastroenteritis and then noted to be in atrial fibrillation with rapid ventricular response.  Patient denies any nausea vomiting or diarrhea now.  Denies any chest pains or shortness of breath.  Remains in A. fib but rate controlled on a Cardizem drip.  REVIEW OF SYSTEMS:    Review of Systems  Constitutional: Negative for chills and fever.  HENT: Negative for congestion and tinnitus.   Eyes: Negative for blurred vision and double vision.  Respiratory: Negative for cough, shortness of breath and wheezing.   Cardiovascular: Negative for chest pain, orthopnea and PND.  Gastrointestinal: Negative for abdominal pain, diarrhea, nausea and vomiting.  Genitourinary: Negative for dysuria and hematuria.  Neurological: Negative for dizziness, sensory change and focal weakness.  All other systems reviewed and are negative.   Nutrition: Heart Healthy/Carb control Tolerating Diet: Yes Tolerating PT: Await Eval.   DRUG ALLERGIES:  No Known Allergies  VITALS:  Blood pressure (!) 106/58, pulse (!) 57, temperature 98.4 F (36.9 C), temperature source Oral, resp. rate 18, height 6\' 2"  (1.88 m), weight 99.3 kg, SpO2 92 %.  PHYSICAL EXAMINATION:   Physical Exam  GENERAL:  85 y.o.-year-old obese patient lying in bed in no acute distress.  EYES: Pupils equal, round, reactive to light and accommodation. No scleral icterus. Extraocular muscles intact.  HEENT: Head atraumatic, normocephalic. Oropharynx and nasopharynx clear.  NECK:  Supple, no jugular venous distention. No thyroid enlargement, no tenderness.  LUNGS: Normal breath sounds bilaterally, no wheezing, rales, rhonchi. No use of accessory muscles of respiration.  CARDIOVASCULAR: S1, S2 Irregular. No murmurs, rubs, or gallops.    ABDOMEN: Soft, nontender, nondistended. Bowel sounds present. No organomegaly or mass.  EXTREMITIES: No cyanosis, clubbing, Trace edema b/l.  NEUROLOGIC: Cranial nerves II through XII are intact. No focal Motor or sensory deficits b/l. Globally weak.    PSYCHIATRIC: The patient is alert and oriented x 3.  SKIN: No obvious rash, lesion, or ulcer.  Multiple actinic/seborrheic keratoses throughout body   LABORATORY PANEL:   CBC Recent Labs  Lab 01/09/19 0450  WBC 14.1*  HGB 11.5*  HCT 35.4*  PLT 171   ------------------------------------------------------------------------------------------------------------------  Chemistries  Recent Labs  Lab 01/08/19 1739 01/09/19 0450  NA 139 139  K 4.1 4.2  CL 105 108  CO2 24 25  GLUCOSE 181* 198*  BUN 29* 34*  CREATININE 1.34* 1.58*  CALCIUM 9.5 8.8*  MG 1.7  --   AST 55*  --   ALT 90*  --   ALKPHOS 108  --   BILITOT 0.6  --    ------------------------------------------------------------------------------------------------------------------  Cardiac Enzymes Recent Labs  Lab 01/08/19 1739  TROPONINI <0.03   ------------------------------------------------------------------------------------------------------------------  RADIOLOGY:  No results found.   ASSESSMENT AND PLAN:   85 year old male with past medical history of COPD, history of MI, hypertension, hyperlipidemia, peripheral neuropathy, obesity who presented to the hospital due to nausea vomiting diarrhea and also noted to be in atrial fibrillation with rapid ventricular response.  1.  Atrial fibrillation with rapid ventricular response- started on a Cardizem drip and rates have improved. - Currently rate controlled, Seen by cardiology and started on amiodarone drip and weaned off the Cardizem drip.  Patient started on Eliquis as per cardiology.  Await echocardiogram results  2.  Acute  gastroenteritis-that was the cause of patient's nausea vomiting and diarrhea.   Now resolved.  Continue supportive care.  3.  COPD-no acute exacerbation.  Continue Spiriva.  4.  BPH-continue Flomax.  No urinary retention.  5.  GERD-continue Protonix.    6.  Hyperlipidemia-continue Pravachol.   All the records are reviewed and case discussed with Care Management/Social Worker. Management plans discussed with the patient, family and they are in agreement.  CODE STATUS: Full code  DVT Prophylaxis: Eliquis  TOTAL TIME TAKING CARE OF THIS PATIENT: 30 minutes.   POSSIBLE D/C IN 1-2 DAYS, DEPENDING ON CLINICAL CONDITION.   Henreitta Leber M.D on 01/09/2019 at 1:56 PM  Between 7am to 6pm - Pager - [phone]  After 6pm go to [email or web address] - [employer] [facility]  Office  [phone]  CC: Primary care physician; [name], MD

## 2019-01-09 NOTE — ED Notes (Signed)
ED TO INPATIENT HANDOFF REPORT  Name/Age/Gender Justin Leonard 85 y.o. male  Code Status Code Status History    Date Active Date Inactive Code Status Order ID Comments User Context   12/05/2017 0238 12/05/2017 2109 Full Code [id number]  Saundra Shelling, MD ED   11/04/2016 0919 11/05/2016 1511 Full Code [id number]  [name], MD Inpatient   09/21/2016 1605 09/22/2016 1806 Full Code [id number]  Lytle Butte, MD ED   05/09/2016 1617 05/12/2016 1629 Full Code [id number]  Idelle Crouch, MD Inpatient   11/05/2015 0638 11/14/2015 1939 Full Code [id number]  Harrie Foreman, MD Inpatient   07/17/2015 1614 07/18/2015 1639 Full Code [id number]  [name], MD Inpatient   07/17/2015 1541 07/17/2015 1614 Full Code [id number]  [name], MD Inpatient   07/15/2015 1704 07/17/2015 1541 Full Code [id number]  Demetrios Loll, MD Inpatient    Advance Directive Documentation     Most Recent Value  Type of Advance Directive  Living will  Pre-existing out of facility DNR order (yellow form or pink MOST form)  -  "MOST" Form in Place?  -      Chief Complaint Emesis  Level of Care/Admitting Diagnosis ED Disposition    ED Disposition Condition [place]: [place] [[id number]]  Level of Care: Telemetry [5]  Diagnosis: Atrial fibrillation with RVR [facility]) [[id number]]  Admitting Physician: Lance Coon [[id number]]  Attending Physician: Lance Coon [phone]  Estimated length of stay: past midnight tomorrow  Certification:: I certify this patient will need inpatient services for at least 2 midnights  Bed request comments: 2a  PT Class (Do Not Modify): Inpatient [101]  PT Acc Code (Do Not Modify): Private [1]       Medical History Past Medical History:  Diagnosis Date  . Anemia   . Arthritis   . Back pain   . CHF (congestive heart failure) ([place])   . COPD (chronic obstructive pulmonary disease) ([place])   . Coronary artery disease   . Deafness   .  Depression   . Diabetes mellitus without complication ([place])   . Dizziness    when gets up  . GERD (gastroesophageal reflux disease)   . Hyperlipidemia   . Hypertension   . Melena   . Myocardial infarction ([place])    maybe a small one  . Obesity   . Oxygen decrease    WEARS HS,PRN  . Peripheral neuropathy   . Peripheral neuropathy    bil.hands and feet  . Skin cancer   . Umbilical hernia     Allergies No Known Allergies  IV Location/Drains/Wounds Patient Lines/Drains/Airways Status   Active Line/Drains/Airways    Name:   Placement date:   Placement time:   Site:   Days:   Peripheral IV 01/08/19 Right Antecubital   01/08/19    1810    Antecubital   1   Incision - 4 Ports Abdomen Upper Right Right Umbilicus   89/37/34    2876     796          Labs/Imaging Results for orders placed or performed during the hospital encounter of 01/08/19 (from the past 48 hour(s))  Lipase, blood     Status: None   Collection Time: 01/08/19  5:39 PM  Result Value Ref Range   Lipase 35 11 - 51 U/L    Comment: Performed at [facility], [address] [place], [place] [address]  Comprehensive metabolic panel  Status: Abnormal   Collection Time: 01/08/19  5:39 PM  Result Value Ref Range   Sodium 139 135 - 145 mmol/L   Potassium 4.1 3.5 - 5.1 mmol/L   Chloride 105 98 - 111 mmol/L   CO2 24 22 - 32 mmol/L   Glucose, Bld 181 (H) 70 - 99 mg/dL   BUN 29 (H) 8 - 23 mg/dL   Creatinine, Ser 1.34 (H) 0.61 - 1.24 mg/dL   Calcium 9.5 8.9 - 10.3 mg/dL   Total Protein 7.1 6.5 - 8.1 g/dL   Albumin 4.0 3.5 - 5.0 g/dL   AST 55 (H) 15 - 41 U/L   ALT 90 (H) 0 - 44 U/L   Alkaline Phosphatase 108 38 - 126 U/L   Total Bilirubin 0.6 0.3 - 1.2 mg/dL   GFR calc non Af Amer 47 (L) >60 mL/min   GFR calc Af Amer 54 (L) >60 mL/min   Anion gap 10 5 - 15    Comment: Performed at [facility], [address] [place], [place] [address]  CBC     Status: Abnormal   Collection Time: 01/08/19   5:39 PM  Result Value Ref Range   WBC 13.4 (H) 4.0 - 10.5 K/uL   RBC 4.32 4.22 - 5.81 MIL/uL   Hemoglobin 13.2 13.0 - 17.0 g/dL   HCT 41.3 39.0 - 52.0 %   MCV 95.6 80.0 - 100.0 fL   MCH 30.6 26.0 - 34.0 pg   MCHC 32.0 30.0 - 36.0 g/dL   RDW 13.2 11.5 - 15.5 %   Platelets 223 150 - 400 K/uL   nRBC 0.0 0.0 - 0.2 %    Comment: Performed at [facility], [address], [place], [place] [address]  Protime-INR     Status: None   Collection Time: 01/08/19  5:39 PM  Result Value Ref Range   Prothrombin Time 12.8 11.4 - 15.2 seconds   INR 0.97     Comment: Performed at [facility], [address] [place], [place] [address]  APTT     Status: None   Collection Time: 01/08/19  5:39 PM  Result Value Ref Range   aPTT 29 24 - 36 seconds    Comment: Performed at [facility], [address], [place], [place] [address]  Magnesium     Status: None   Collection Time: 01/08/19  5:39 PM  Result Value Ref Range   Magnesium 1.7 1.7 - 2.4 mg/dL    Comment: Performed at [facility], [address] [place], [place] [address]  Troponin I - Add-On to previous collection     Status: None   Collection Time: 01/08/19  5:39 PM  Result Value Ref Range   Troponin I <0.03 <0.03 ng/mL    Comment: Performed at [facility], [address] [place],  [address]  Urinalysis, Complete w Microscopic     Status: Abnormal   Collection Time: 01/08/19  6:24 PM  Result Value Ref Range   Color, Urine YELLOW (A) YELLOW   APPearance CLEAR (A) CLEAR   Specific Gravity, Urine 1.018 1.005 - 1.030   pH 5.0 5.0 - 8.0   Glucose, UA NEGATIVE NEGATIVE mg/dL   Hgb urine dipstick NEGATIVE NEGATIVE   Bilirubin Urine NEGATIVE NEGATIVE   Ketones, ur 5 (A) NEGATIVE mg/dL   Protein, ur 100 (A) NEGATIVE mg/dL   Nitrite NEGATIVE NEGATIVE   Leukocytes, UA NEGATIVE NEGATIVE   RBC / HPF 0-5 0 -  5 RBC/hpf   WBC, UA 0-5 0 - 5 WBC/hpf   Bacteria, UA RARE (A) NONE SEEN    Squamous Epithelial / LPF 0-5 0 - 5   Mucus PRESENT    Hyaline Casts, UA PRESENT     Comment: Performed at [facility], [address] [place], [place] [address]  Influenza panel by PCR (type A & B)     Status: None   Collection Time: 01/08/19  6:24 PM  Result Value Ref Range   Influenza A By PCR NEGATIVE NEGATIVE   Influenza B By PCR NEGATIVE NEGATIVE    Comment: (NOTE) The Xpert Xpress Flu assay is intended as an aid in the diagnosis of  influenza and should not be used as a sole basis for treatment.  This  assay is FDA approved for nasopharyngeal swab specimens only. Nasal  washings and aspirates are unacceptable for Xpert Xpress Flu testing. Performed at [facility], [address] [place], [place] [address]    No results found.  Pending Labs [employer] (From admission, onward)    Start     Ordered   Signed and Occupational hygienist morning,   R     Signed and Held   Signed and Held  CBC  Tomorrow morning,   R     Signed and Held          Vitals/Pain Today's Vitals   01/08/19 2300 01/08/19 2303 01/08/19 2304 01/08/19 2315  BP: (!) 134/56     Pulse: (!) 56 (!) 56 (!) 56 73  Resp: (!) 22 (!) 23 (!) 23 (!) 23  Temp:      TempSrc:      SpO2: 98% 97% 98% 97%  Weight:      Height:      PainSc:        Isolation Precautions Droplet precaution  Medications Medications  diltiazem (CARDIZEM) 100 mg in dextrose 5% 19mL (1 mg/mL) infusion (5 mg/hr Intravenous New Bag/Given 01/08/19 2223)  apixaban (ELIQUIS) tablet 5 mg (has no administration in time range)  sodium chloride 0.9 % bolus 1,000 mL (0 mLs Intravenous Stopped 01/08/19 1902)  ondansetron (ZOFRAN) injection 4 mg (4 mg Intravenous Given 01/08/19 1817)  magnesium sulfate IVPB 2 g 50 mL (0 g Intravenous Stopped 01/08/19 1921)  diltiazem (CARDIZEM) injection 20 mg (20 mg Intravenous Given 01/08/19 1912)  diltiazem (CARDIZEM CD) 24 hr capsule 180 mg (180 mg Oral Given  01/08/19 2102)  diltiazem (CARDIZEM) injection 20 mg (20 mg Intravenous Given 01/08/19 2029)  apixaban (ELIQUIS) tablet 5 mg (5 mg Oral Given 01/08/19 2142)    Mobility x1 assist

## 2019-01-09 NOTE — Progress Notes (Signed)
ANTICOAGULATION CONSULT NOTE - Initial Consult  Pharmacy Consult for apixaban Indication: atrial fibrillation  No Known Allergies  Patient Measurements: Height: 6\' 2"  (188 cm) Weight: 218 lb (98.9 kg) IBW/kg (Calculated) : 82.2  Vital Signs: Temp: 97.7 F (36.5 C) (02/10 1718) Temp Source: Oral (02/10 1718) BP: 134/56 (02/10 2300) Pulse Rate: 73 (02/10 2315)  Labs: Recent Labs    01/08/19 1739  HGB 13.2  HCT 41.3  PLT 223  APTT 29  LABPROT 12.8  INR 0.97  CREATININE 1.34*  TROPONINI <0.03    Estimated Creatinine Clearance: 47 mL/min (A) (by C-G formula based on SCr of 1.34 mg/dL (H)).   Medical History: Past Medical History:  Diagnosis Date  . Anemia   . Arthritis   . Back pain   . CHF (congestive heart failure) ([place])   . COPD (chronic obstructive pulmonary disease) ([place])   . Coronary artery disease   . Deafness   . Depression   . Diabetes mellitus without complication ([place])   . Dizziness    when gets up  . GERD (gastroesophageal reflux disease)   . Hyperlipidemia   . Hypertension   . Melena   . Myocardial infarction ([place])    maybe a small one  . Obesity   . Oxygen decrease    WEARS HS,PRN  . Peripheral neuropathy   . Peripheral neuropathy    bil.hands and feet  . Skin cancer   . Umbilical hernia     Medications:  Scheduled:  . apixaban  5 mg Oral BID    Assessment: Patient admitted s/t SOB, V/D w/ h/o CAD s/p cath 2016 & 2017, COPD, DMx. Patient on EKG appears to have new onset of afib w/ RVR. CHADS-VASc = 4 (htn, age > 42 x 2, dm) TBW 98.9 kg Age 85 y/o Scr 1.34 mg/dL (doesn't appear to have CKD but is in AKI)  Goal of Therapy:  Monitor platelets by anticoagulation protocol: Yes   Plan:  Since patient only meets one of the 3 criteria for dose adjustment, Will start patient on apixaban 5 mg twice daily for anticoagulation of new onset afib w/ RVR. Will continue to monitor CBC and CMP to monitor renal fx.  Tobie Lords, PharmD,  BCPS Clinical Pharmacist 01/09/2019

## 2019-01-09 NOTE — Plan of Care (Signed)
  Problem: Education: Goal: Knowledge of General Education information will improve Description Including pain rating scale, medication(s)/side effects and non-pharmacologic comfort measures Outcome: Progressing   Problem: Health Behavior/Discharge Planning: Goal: Ability to manage health-related needs will improve Outcome: Progressing   Problem: Clinical Measurements: Goal: Ability to maintain clinical measurements within normal limits will improve Outcome: Progressing Goal: Will remain free from infection Outcome: Progressing Note:  Remains afebrile Goal: Respiratory complications will improve Outcome: Progressing   Problem: Pain Managment: Goal: General experience of comfort will improve Outcome: Progressing   Problem: Safety: Goal: Ability to remain free from injury will improve Outcome: Progressing   Problem: Clinical Measurements: Goal: Diagnostic test results will improve Outcome: Not Progressing Note:  Potassium 4.2, BUN 34/1.58 WBC 14.1 this am   Problem: Education: Goal: Understanding of medication regimen will improve Outcome: Not Progressing Note:  Remains on Cardizem gtt at 5mg /hr, awaiting 2D echo and cardiology consult

## 2019-01-09 NOTE — Progress Notes (Signed)
  Amiodarone Drug - Drug Interaction Consult Note  Recommendations: Continue to monitor QTc- 2/10 EKG QTC 530- use caution when giving zofran as this can prolong the QT interval. No major Drug interactions with current medications. Potassium WNL. Mag 1.7.   Amiodarone is metabolized by the cytochrome P450 system and therefore has the potential to cause many drug interactions. Amiodarone has an average plasma half-life of 50 days (range 20 to 100 days).   There is potential for drug interactions to occur several weeks or months after stopping treatment and the onset of drug interactions may be slow after initiating amiodarone.   []  Statins: Increased risk of myopathy. Simvastatin- restrict dose to 20mg  daily. Other statins: counsel patients to report any muscle pain or weakness immediately.  []  Anticoagulants: Amiodarone can increase anticoagulant effect. Consider warfarin dose reduction. Patients should be monitored closely and the dose of anticoagulant altered accordingly, remembering that amiodarone levels take several weeks to stabilize.  []  Antiepileptics: Amiodarone can increase plasma concentration of phenytoin, the dose should be reduced. Note that small changes in phenytoin dose can result in large changes in levels. Monitor patient and counsel on signs of toxicity.  []  Beta blockers: increased risk of bradycardia, AV block and myocardial depression. Sotalol - avoid concomitant use.  []   Calcium channel blockers (diltiazem and verapamil): increased risk of bradycardia, AV block and myocardial depression.  []   Cyclosporine: Amiodarone increases levels of cyclosporine. Reduced dose of cyclosporine is recommended.  []  Digoxin dose should be halved when amiodarone is started.  []  Diuretics: increased risk of cardiotoxicity if hypokalemia occurs.  []  Oral hypoglycemic agents (glyburide, glipizide, glimepiride): increased risk of hypoglycemia. Patient's glucose levels should be monitored  closely when initiating amiodarone therapy.   [x]  Drugs that prolong the QT interval:  Torsades de pointes risk may be increased with concurrent use - avoid if possible.  Monitor QTc, also keep magnesium/potassium WNL if concurrent therapy can't be avoided. Marland Kitchen Antibiotics: e.g. fluoroquinolones, erythromycin. . Antiarrhythmics: e.g. quinidine, procainamide, disopyramide, sotalol. . Antipsychotics: e.g. phenothiazines, haloperidol.  . Lithium, tricyclic antidepressants, and methadone. Thank You,  Oswald Hillock  01/09/2019 11:48 AM

## 2019-01-10 LAB — BASIC METABOLIC PANEL
Anion gap: 4 — ABNORMAL LOW (ref 5–15)
BUN: 37 mg/dL — ABNORMAL HIGH (ref 8–23)
CHLORIDE: 106 mmol/L (ref 98–111)
CO2: 27 mmol/L (ref 22–32)
Calcium: 9.1 mg/dL (ref 8.9–10.3)
Creatinine, Ser: 1.45 mg/dL — ABNORMAL HIGH (ref 0.61–1.24)
GFR calc Af Amer: 49 mL/min — ABNORMAL LOW (ref >60)
GFR calc non Af Amer: 42 mL/min — ABNORMAL LOW (ref >60)
Glucose, Bld: 162 mg/dL — ABNORMAL HIGH (ref 70–99)
POTASSIUM: 4 mmol/L (ref 3.5–5.1)
Sodium: 137 mmol/L (ref 135–145)

## 2019-01-10 LAB — CBC
HCT: 31.8 % — ABNORMAL LOW (ref 39.0–52.0)
Hemoglobin: 10.1 g/dL — ABNORMAL LOW (ref 13.0–17.0)
MCH: 30.1 pg (ref 26.0–34.0)
MCHC: 31.8 g/dL (ref 30.0–36.0)
MCV: 94.6 fL (ref 80.0–100.0)
Platelets: 144 10*3/uL — ABNORMAL LOW (ref 150–400)
RBC: 3.36 MIL/uL — ABNORMAL LOW (ref 4.22–5.81)
RDW: 13.2 % (ref 11.5–15.5)
WBC: 8.2 10*3/uL (ref 4.0–10.5)
nRBC: 0 % (ref 0.0–0.2)

## 2019-01-10 LAB — GLUCOSE, CAPILLARY
Glucose-Capillary: 136 mg/dL — ABNORMAL HIGH (ref 70–99)
Glucose-Capillary: 141 mg/dL — ABNORMAL HIGH (ref 70–99)

## 2019-01-10 MED ORDER — APIXABAN 5 MG PO TABS: 5.0000 mg | ORAL_TABLET | Freq: Two times a day (BID) | ORAL | Status: DC

## 2019-01-10 MED ORDER — AMIODARONE HCL 400 MG PO TABS: 400.0000 mg | ORAL_TABLET | Freq: Every day | ORAL | 1 refills | Status: DC

## 2019-01-10 MED ORDER — METOPROLOL SUCCINATE ER 25 MG PO TB24
25.0000 mg | ORAL_TABLET | Freq: Every day | ORAL | Status: DC
  Administered 2019-01-10: 25 mg via ORAL
  Filled 2019-01-10: qty 1

## 2019-01-10 MED ORDER — AMIODARONE HCL 200 MG PO TABS
400.0000 mg | ORAL_TABLET | Freq: Every day | ORAL | Status: DC
  Administered 2019-01-10: 400 mg via ORAL
  Filled 2019-01-10: qty 2

## 2019-01-10 MED ORDER — APIXABAN 5 MG PO TABS: 5.0000 mg | ORAL_TABLET | Freq: Two times a day (BID) | ORAL | 1 refills | Status: DC

## 2019-01-10 NOTE — Discharge Summary (Signed)
[facility] [place] NAME: Justin Leonard    MR#:  [id number]  DATE OF BIRTH:  10-20-1927  DATE OF ADMISSION:  01/08/2019 ADMITTING PHYSICIAN: Lance Coon, MD  DATE OF DISCHARGE: 01/10/2019  2:40 PM  PRIMARY CARE PHYSICIAN: [name], MD    ADMISSION DIAGNOSIS:  Dehydration [E86.0] Atrial fibrillation with RVR ([place]) [I48.91] Nausea vomiting and diarrhea [R11.2, R19.7]  DISCHARGE DIAGNOSIS:  Principal Problem:   Atrial fibrillation with RVR ([place]) Active Problems:   Chronic diastolic heart failure ([name])   HTN (hypertension)   Diabetes ([name])   COPD (chronic obstructive pulmonary disease) ([name])   HLD (hyperlipidemia)   GERD (gastroesophageal reflux disease)   SECONDARY DIAGNOSIS:   Past Medical History:  Diagnosis Date  . Anemia   . Arthritis   . Back pain   . CHF (congestive heart failure) ([place])   . COPD (chronic obstructive pulmonary disease) ([place])   . Coronary artery disease   . Deafness   . Depression   . Diabetes mellitus without complication ([place])   . Dizziness    when gets up  . GERD (gastroesophageal reflux disease)   . Hyperlipidemia   . Hypertension   . Melena   . Myocardial infarction ([place])    maybe a small one  . Obesity   . Oxygen decrease    WEARS HS,PRN  . Peripheral neuropathy   . Peripheral neuropathy    bil.hands and feet  . Skin cancer   . Umbilical hernia     HOSPITAL COURSE:   85 year old male with past medical history of COPD, history of MI, hypertension, hyperlipidemia, peripheral neuropathy, obesity who presented to the hospital due to nausea vomiting diarrhea and also noted to be in atrial fibrillation with rapid ventricular response.  1.  Atrial fibrillation with rapid ventricular response- patient was admitted to the hospital and started on a Cardizem drip.  Patient's heart rates improved.  Seen by cardiology and switch from Cardizem to amiodarone drip.  Patient then  subsequently converted to normal sinus rhythm.  He is now being discharged on oral amiodarone and will continue some low-dose metoprolol. - He was started on long-term anticoagulation with Eliquis and will continue that.  Patient will follow-up with cardiology in the next week to 2 weeks.  2.  Acute gastroenteritis-that was the cause of patient's nausea vomiting and diarrhea.  Now resolved.    3.  COPD-no acute exacerbation.  pt. Will Continue Spiriva.  4.  BPH-continue Flomax.  No urinary retention.  5.  GERD-continue Protonix.    6.  Hyperlipidemia-continue Pravachol.  7. Hx of CAD -no acute chest pain.  Continue aspirin, Eliquis, statin, beta-blocker.  Continue Ranexa.  8.  Diabetes type 2 without complication-patient will continue his Lantus.  DISCHARGE CONDITIONS:   Stable.   CONSULTS OBTAINED:  Treatment Team:  [name], MD  DRUG ALLERGIES:  No Known Allergies  DISCHARGE MEDICATIONS:   Allergies as of 01/10/2019   No Known Allergies     Medication List    STOP taking these medications   clopidogrel 75 MG tablet Commonly known as:  PLAVIX     TAKE these medications   acetaminophen 650 MG CR tablet Commonly known as:  TYLENOL Take 650 mg by mouth 2 (two) times daily as needed for pain.   amiodarone 400 MG tablet Commonly known as:  PACERONE Take 1 tablet (400 mg total) by mouth daily. Start taking on:  January 11, 2019  apixaban 5 MG Tabs tablet Commonly known as:  ELIQUIS Take 1 tablet (5 mg total) by mouth 2 (two) times daily.   aspirin EC 81 MG tablet Take 81 mg by mouth daily.   ferrous sulfate 325 (65 FE) MG tablet Take 325 mg by mouth 2 (two) times daily.   FLUoxetine 40 MG capsule Commonly known as:  PROZAC Take 40 mg by mouth daily.   furosemide 40 MG tablet Commonly known as:  LASIX Take 1 tablet (40 mg total) by mouth daily.   insulin glargine 100 UNIT/ML injection Commonly known as:  LANTUS Inject 15-18 Units into the  skin at bedtime.   isosorbide mononitrate 30 MG 24 hr tablet Commonly known as:  IMDUR Take 1 tablet (30 mg total) by mouth daily.   lisinopril 2.5 MG tablet Commonly known as:  PRINIVIL,ZESTRIL Take 2.5 mg by mouth daily.   lovastatin 20 MG tablet Commonly known as:  MEVACOR Take 20 mg by mouth at bedtime.   metFORMIN 500 MG tablet Commonly known as:  GLUCOPHAGE Take 500 mg by mouth 2 (two) times daily.   metoprolol succinate 25 MG 24 hr tablet Commonly known as:  TOPROL-XL Take 25 mg by mouth daily.   MIRALAX PO Take 17 g by mouth daily.   nitroGLYCERIN 0.4 MG SL tablet Commonly known as:  NITROSTAT Place 0.4 mg under the tongue every 5 (five) minutes as needed for chest pain.   OXYGEN Place 2 L into the nose Nightly.   pantoprazole 40 MG tablet Commonly known as:  PROTONIX Take 40 mg by mouth daily.   ranolazine 500 MG 12 hr tablet Commonly known as:  RANEXA Take 500 mg by mouth 2 (two) times daily.   SPIRIVA RESPIMAT 2.5 MCG/ACT Aers Generic drug:  Tiotropium Bromide Monohydrate Inhale 2 puffs into the lungs See admin instructions. Inhale 2 puffs 1 to 2 times a day.   tamsulosin 0.4 MG Caps capsule Commonly known as:  FLOMAX Take 1 capsule (0.4 mg total) by mouth daily after supper.   VENTOLIN HFA 108 (90 Base) MCG/ACT inhaler Generic drug:  albuterol Inhale 1-2 puffs into the lungs as needed for wheezing or shortness of breath. Reported on 12/16/2015         DISCHARGE INSTRUCTIONS:   DIET:  Cardiac diet and Diabetic diet  DISCHARGE CONDITION:  Stable  ACTIVITY:  Activity as tolerated  OXYGEN:  Home Oxygen: No.   Oxygen Delivery: room air  DISCHARGE LOCATION:  home   If you experience worsening of your admission symptoms, develop shortness of breath, life threatening emergency, suicidal or homicidal thoughts you must seek medical attention immediately by calling 911 or calling your MD immediately  if symptoms less severe.  You Must read  complete instructions/literature along with all the possible adverse reactions/side effects for all the Medicines you take and that have been prescribed to you. Take any new Medicines after you have completely understood and accpet all the possible adverse reactions/side effects.   Please note  You were cared for by a hospitalist during your hospital stay. If you have any questions about your discharge medications or the care you received while you were in the hospital after you are discharged, you can call the unit and asked to speak with the hospitalist on call if the hospitalist that took care of you is not available. Once you are discharged, your primary care physician will handle any further medical issues. Please note that NO REFILLS for any discharge medications will  be authorized once you are discharged, as it is imperative that you return to your primary care physician (or establish a relationship with a primary care physician if you do not have one) for your aftercare needs so that they can reassess your need for medications and monitor your lab values.     Today   Converted to normal sinus rhythm.  Asymptomatic.  No complaints presently.  Will discharge home today.  Outpatient follow-up with cardiology.  VITAL SIGNS:  Blood pressure 140/69, pulse 78, temperature 97.8 F (36.6 C), temperature source Oral, resp. rate 20, height 6\' 2"  (1.88 m), weight 99.3 kg, SpO2 99 %.  I/O:    Intake/Output Summary (Last 24 hours) at 01/10/2019 1514 Last data filed at 01/10/2019 1409 Gross per 24 hour  Intake 618.03 ml  Output 1700 ml  Net -1081.97 ml    PHYSICAL EXAMINATION:   GENERAL:  85 y.o.-year-old obese patient lying in bed in no acute distress.  EYES: Pupils equal, round, reactive to light and accommodation. No scleral icterus. Extraocular muscles intact.  HEENT: Head atraumatic, normocephalic. Oropharynx and nasopharynx clear.  NECK:  Supple, no jugular venous distention. No thyroid  enlargement, no tenderness.  LUNGS: Normal breath sounds bilaterally, no wheezing, rales, rhonchi. No use of accessory muscles of respiration.  CARDIOVASCULAR: S1, S2 Irregular. No murmurs, rubs, or gallops.  ABDOMEN: Soft, nontender, nondistended. Bowel sounds present. No organomegaly or mass.  EXTREMITIES: No cyanosis, clubbing, Trace edema b/l.  NEUROLOGIC: Cranial nerves II through XII are intact. No focal Motor or sensory deficits b/l. Globally weak.    PSYCHIATRIC: The patient is alert and oriented x 3.  SKIN: No obvious rash, lesion, or ulcer.  Multiple actinic/seborrheic keratoses throughout body  DATA REVIEW:   CBC Recent Labs  Lab 01/10/19 0449  WBC 8.2  HGB 10.1*  HCT 31.8*  PLT 144*    Chemistries  Recent Labs  Lab 01/08/19 1739  01/10/19 0449  NA 139   < > 137  K 4.1   < > 4.0  CL 105   < > 106  CO2 24   < > 27  GLUCOSE 181*   < > 162*  BUN 29*   < > 37*  CREATININE 1.34*   < > 1.45*  CALCIUM 9.5   < > 9.1  MG 1.7  --   --   AST 55*  --   --   ALT 90*  --   --   ALKPHOS 108  --   --   BILITOT 0.6  --   --    < > = values in this interval not displayed.    Cardiac Enzymes Recent Labs  Lab 01/08/19 1739  TROPONINI <0.03      RADIOLOGY:  No results found.    Management plans discussed with the patient, family and they are in agreement.  CODE STATUS:     Code Status Orders  (From admission, onward)         Start     Ordered   01/09/19 0034  Full code  Continuous     01/09/19 0033         TOTAL TIME TAKING CARE OF THIS PATIENT: 40 minutes.    Henreitta Leber M.D on 01/10/2019 at 3:14 PM  Between 7am to 6pm - Pager - [phone]  After 6pm go to [email or web address] - Technical [employer]wer [place]  Office  [phone]  CC: Primary care physician; [name], MD

## 2019-01-10 NOTE — Care Management Note (Signed)
Case Management Note  Patient Details  Name: Justin Leonard MRN: [id number] Date of Birth: [birth year]  Subjective/Objective:  Discharging home today with son, Clint.  Patient lives with wife who has end stage cancer and other family members.  Admitted with atrial fibrillation; starting on Eliquis.  States he has prescription coverage.  Eliquis coupon provided.  Current with PCP.  Chronic oxygen at 2L.  Current with PCP.  Denies difficulties obtaining medications or with accessing healthcare.  Medications obtained at [facility] in [place].  Offered HH services and patient and son decline at this time.  No further needs identified.                  Action/Plan:   Expected Discharge Date:  01/10/19               Expected Discharge Plan:  Home/Self Care  In-House Referral:     Discharge planning Services  CM Consult  Post Acute Care Choice:    Choice offered to:     DME Arranged:    DME Agency:     HH Arranged:  Patient Refused [facility] Agency:     Status of Service:  Completed, signed off  If discussed at [employer] of Stay Meetings, dates discussed:    Additional Comments:  Elza Rafter, RN 01/10/2019, 1:57 PM

## 2019-01-10 NOTE — Plan of Care (Signed)
  Problem: Elimination: Goal: Will not experience complications related to urinary retention Outcome: Progressing   Problem: Pain Managment: Goal: General experience of comfort will improve Outcome: Progressing Note:  Received tylenol once for arthritis pain    Problem: Safety: Goal: Ability to remain free from injury will improve Outcome: Progressing   Problem: Skin Integrity: Goal: Risk for impaired skin integrity will decrease Outcome: Progressing   Problem: Cardiac: Goal: Ability to achieve and maintain adequate cardiopulmonary perfusion will improve Outcome: Progressing Note:  Continues on amiodarone, but back in sinus rhythm now    Problem: Education: Goal: Knowledge of General Education information will improve Description Including pain rating scale, medication(s)/side effects and non-pharmacologic comfort measures Outcome: Completed/Met

## 2019-01-10 NOTE — Progress Notes (Signed)
Pt discharged to home via wc.  Instructions and rx given to pt.  Questions answered.  No distress.  

## 2019-01-10 NOTE — Progress Notes (Signed)
SUBJECTIVE: Patient is feeling well. No chest pain, no GI upset, on 3L oxygen.     Vitals:   01/09/19 1556 01/09/19 1941 01/10/19 0411 01/10/19 0729  BP: (!) 106/50 (!) 103/59 131/68 140/69  Pulse: 76 80 83 78  Resp: 18 18 20    Temp: 99.2 F (37.3 C) 98.2 F (36.8 C) 98 F (36.7 C) 97.8 F (36.6 C)  TempSrc: Oral Oral Oral Oral  SpO2: 93% 95% 97% 99%  Weight:      Height:        Intake/Output Summary (Last 24 hours) at 01/10/2019 0833 Last data filed at 01/10/2019 9449 Gross per 24 hour  Intake 677.74 ml  Output 1600 ml  Net -922.26 ml    LABS: Basic Metabolic Panel: Recent Labs    01/08/19 1739 01/09/19 0450 01/10/19 0449  NA 139 139 137  K 4.1 4.2 4.0  CL 105 108 106  CO2 24 25 27   GLUCOSE 181* 198* 162*  BUN 29* 34* 37*  CREATININE 1.34* 1.58* 1.45*  CALCIUM 9.5 8.8* 9.1  MG 1.7  --   --    Liver Function Tests: Recent Labs    01/08/19 1739  AST 55*  ALT 90*  ALKPHOS 108  BILITOT 0.6  PROT 7.1  ALBUMIN 4.0   Recent Labs    01/08/19 1739  LIPASE 35   CBC: Recent Labs    01/09/19 0450 01/10/19 0449  WBC 14.1* 8.2  HGB 11.5* 10.1*  HCT 35.4* 31.8*  MCV 95.9 94.6  PLT 171 144*   Cardiac Enzymes: Recent Labs    01/08/19 1739  TROPONINI <0.03   BNP: Invalid input(s): POCBNP D-Dimer: No results for input(s): DDIMER in the last 72 hours. Hemoglobin A1C: No results for input(s): HGBA1C in the last 72 hours. Fasting Lipid Panel: No results for input(s): CHOL, HDL, LDLCALC, TRIG, CHOLHDL, LDLDIRECT in the last 72 hours. Thyroid Function Tests: No results for input(s): TSH, T4TOTAL, T3FREE, THYROIDAB in the last 72 hours.  Invalid input(s): FREET3 Anemia Panel: No results for input(s): VITAMINB12, FOLATE, FERRITIN, TIBC, IRON, RETICCTPCT in the last 72 hours.   PHYSICAL EXAM General: Elderly, frail HEENT:  Normocephalic and atramatic Neck:  No JVD.  Lungs: Clear bilaterally to auscultation and percussion. Heart: HRRR . Normal S1  and S2 without gallops or murmurs.  Abdomen: Bowel sounds are positive, abdomen soft and non-tender  Msk:  Back normal, normal gait. Normal strength and tone for age. Extremities: No clubbing, cyanosis or edema.   Neuro: Alert and oriented X 3. Psych:  Good affect, responds appropriately  TELEMETRY: NSR 84bpm  ASSESSMENT AND PLAN:  Atrial fibrillation: Afib has converted back to NSR with amiodarone drip. Now rate controlled. Stop amiodarone drip and will start oral amiodarone 400mg  daily. Echo shows normal LVEF, normal wall motion, mild diastolic dysfunction.   Add low-dose metoprolol for rate control and ok to discharge home from cardiac perspective and follow up outpatient with Dr. [name] at [facility] Friday 2/14 at 10am.   Principal Problem:   Atrial fibrillation with RVR ([place]) Active Problems:   Chronic diastolic heart failure ([place])   HTN (hypertension)   Diabetes ([place])   COPD (chronic obstructive pulmonary disease) ([place])   HLD (hyperlipidemia)   GERD (gastroesophageal reflux disease)    Jake Bathe, NP-C 01/10/2019 8:33 AM Cell: [phone]

## 2019-01-10 NOTE — Progress Notes (Signed)
  Amiodarone Drug - Drug Interaction Consult Note  Recommendations: No new recommendations or changes. No new medications ordered.  Continue to monitor QTc- 2/10 EKG QTC 530- use caution when giving zofran as this can prolong the QT interval. No major Drug interactions with current medications. Potassium WNL. Mag 1.7 (2/10).   Amiodarone is metabolized by the cytochrome P450 system and therefore has the potential to cause many drug interactions. Amiodarone has an average plasma half-life of 50 days (range 20 to 100 days).   There is potential for drug interactions to occur several weeks or months after stopping treatment and the onset of drug interactions may be slow after initiating amiodarone.   []  Statins: Increased risk of myopathy. Simvastatin- restrict dose to 20mg  daily. Other statins: counsel patients to report any muscle pain or weakness immediately.  []  Anticoagulants: Amiodarone can increase anticoagulant effect. Consider warfarin dose reduction. Patients should be monitored closely and the dose of anticoagulant altered accordingly, remembering that amiodarone levels take several weeks to stabilize.  []  Antiepileptics: Amiodarone can increase plasma concentration of phenytoin, the dose should be reduced. Note that small changes in phenytoin dose can result in large changes in levels. Monitor patient and counsel on signs of toxicity.  []  Beta blockers: increased risk of bradycardia, AV block and myocardial depression. Sotalol - avoid concomitant use.  []   Calcium channel blockers (diltiazem and verapamil): increased risk of bradycardia, AV block and myocardial depression.  []   Cyclosporine: Amiodarone increases levels of cyclosporine. Reduced dose of cyclosporine is recommended.  []  Digoxin dose should be halved when amiodarone is started.  []  Diuretics: increased risk of cardiotoxicity if hypokalemia occurs.  []  Oral hypoglycemic agents (glyburide, glipizide, glimepiride):  increased risk of hypoglycemia. Patient's glucose levels should be monitored closely when initiating amiodarone therapy.   [x]  Drugs that prolong the QT interval:  Torsades de pointes risk may be increased with concurrent use - avoid if possible.  Monitor QTc, also keep magnesium/potassium WNL if concurrent therapy can't be avoided. Marland Kitchen Antibiotics: e.g. fluoroquinolones, erythromycin. . Antiarrhythmics: e.g. quinidine, procainamide, disopyramide, sotalol. . Antipsychotics: e.g. phenothiazines, haloperidol.  . Lithium, tricyclic antidepressants, and methadone. Thank You,  Oswald Hillock  01/10/2019 12:36 PM

## 2019-07-03 ENCOUNTER — Other Ambulatory Visit: Payer: Self-pay

## 2019-07-03 ENCOUNTER — Encounter (INDEPENDENT_AMBULATORY_CARE_PROVIDER_SITE_OTHER): Payer: Self-pay

## 2019-07-03 ENCOUNTER — Ambulatory Visit (INDEPENDENT_AMBULATORY_CARE_PROVIDER_SITE_OTHER): Payer: Medicare Other | Admitting: Vascular Surgery

## 2019-07-03 VITALS — BP 109/53 | HR 77 | Resp 16 | Ht 74.0 in | Wt 219.6 lb

## 2019-07-03 DIAGNOSIS — J410 Simple chronic bronchitis: Secondary | ICD-10-CM | POA: Diagnosis not present

## 2019-07-03 DIAGNOSIS — I4891 Unspecified atrial fibrillation: Secondary | ICD-10-CM | POA: Diagnosis not present

## 2019-07-03 DIAGNOSIS — E119 Type 2 diabetes mellitus without complications: Secondary | ICD-10-CM | POA: Diagnosis not present

## 2019-07-03 DIAGNOSIS — Z794 Long term (current) use of insulin: Secondary | ICD-10-CM

## 2019-07-03 DIAGNOSIS — I10 Essential (primary) hypertension: Secondary | ICD-10-CM

## 2019-07-03 DIAGNOSIS — E785 Hyperlipidemia, unspecified: Secondary | ICD-10-CM

## 2019-07-03 DIAGNOSIS — I6529 Occlusion and stenosis of unspecified carotid artery: Secondary | ICD-10-CM | POA: Insufficient documentation

## 2019-07-03 DIAGNOSIS — I6523 Occlusion and stenosis of bilateral carotid arteries: Secondary | ICD-10-CM

## 2019-07-03 NOTE — Assessment & Plan Note (Signed)
lipid control important in reducing the progression of atherosclerotic disease. Continue statin therapy  

## 2019-07-03 NOTE — Progress Notes (Signed)
Patient ID: Justin Leonard, male   DOB: Dec 07, 1926, 85 y.o.   MRN: [id number]  Chief Complaint  Patient presents with  . New Patient (Initial Visit)    ref Humphrey Rolls for carotid artery stenosis 80% blockage    HPI Justin Leonard is a 85 y.o. male.  I am asked to see the patient by Dr. Rennis Harding for evaluation of carotid stenosis.  The patient reports dizziness and lightheadedness particularly with standing over the past year or so.  Nothing seems to really make that better but rising too quickly makes it worse.  A few weeks ago, he had an episode where he lost vision in his eyes and was unable to see for several minutes.  This returned but it prompted a work-up by his cardiologist.  He had a previous history of carotid disease and a recent carotid duplex was performed demonstrating greater than 70% right ICA stenosis and 50 to 70% left ICA stenosis.  Given these findings, he was referred for further evaluation and treatment.  He has not had arm or leg weakness or numbness.  No speech or swallowing difficulty.  Despite his advanced age he is sharp as attack and still living independently with good activity.   Past Medical History:  Diagnosis Date  . Anemia   . Arthritis   . Back pain   . CHF (congestive heart failure) ([place])   . COPD (chronic obstructive pulmonary disease) ([place])   . Coronary artery disease   . Deafness   . Depression   . Diabetes mellitus without complication ([place])   . Dizziness    when gets up  . GERD (gastroesophageal reflux disease)   . Hyperlipidemia   . Hypertension   . Melena   . Myocardial infarction ([place])    maybe a small one  . Obesity   . Oxygen decrease    WEARS HS,PRN  . Peripheral neuropathy   . Peripheral neuropathy    bil.hands and feet  . Skin cancer   . Umbilical hernia     Past Surgical History:  Procedure Laterality Date  . CARDIAC CATHETERIZATION Left 07/17/2015   Procedure: Right/Left Heart Cath and Coronary Angiography;  Surgeon:  [name], MD;  Location: [place] [facility];  Service: Cardiovascular;  Laterality: Left;  . CARDIAC CATHETERIZATION N/A 07/17/2015   Procedure: Coronary Stent Intervention;  Surgeon: [name], MD;  Location: [place] [facility];  Service: Cardiovascular;  Laterality: N/A;  . CARDIAC CATHETERIZATION Right 05/10/2016   Procedure: Left Heart Cath and Coronary Angiography;  Surgeon: [name], MD;  Location: [place] [facility];  Service: Cardiovascular;  Laterality: Right;  . CHOLECYSTECTOMY N/A 11/04/2016   Procedure: LAPAROSCOPIC CHOLECYSTECTOMY, POSSIBLE OPEN umbilical hernia repair;  Surgeon: [name], MD;  Location: [facility] ORS;  Service: General;  Laterality: N/A;  . COLONOSCOPY WITH PROPOFOL N/A 12/12/2015   Procedure: COLONOSCOPY WITH PROPOFOL;  Surgeon: [name], MD;  Location: [facility] ENDOSCOPY;  Service: Gastroenterology;  Laterality: N/A;  . CORONARY ANGIOPLASTY     stent 8/16  . CORONARY ARTERY BYPASS GRAFT    . ESOPHAGOGASTRODUODENOSCOPY N/A 11/10/2015   Procedure: ESOPHAGOGASTRODUODENOSCOPY (EGD);  Surgeon: [name], MD;  Location[facility] ENDOSCOPY;  Service: Endoscopy;  Laterality: N/A;  . EYE SURGERY    . gall bladder drain    . MOHS SURGERY      Family History  Problem Relation Age of Onset  . Hypertension Father   . CVA  Father   . Anemia Neg Hx   . Arrhythmia Neg Hx   . Asthma Neg Hx   . Clotting disorder Neg Hx   . Fainting Neg Hx   . Heart attack Neg Hx   . Heart disease Neg Hx   . Heart failure Neg Hx   . Hyperlipidemia Neg Hx   . Prostate cancer Neg Hx   . Bladder Cancer Neg Hx   . Kidney cancer Neg Hx     Social History Social History   Tobacco Use  . Smoking status: Former Smoker    Packs/day: 3.00    Years: 30.00    Pack years: 90.00    Types: Cigarettes    Quit date: 04/16/1978    Years since quitting: 41.2  . Smokeless tobacco: Never Used  . Tobacco comment: Quit smoking 1979  Substance Use Topics  . Alcohol use: No     Alcohol/week: 0.0 standard drinks  . Drug use: No     No Known Allergies  Current Outpatient Medications  Medication Sig Dispense Refill  . acetaminophen (TYLENOL) 650 MG CR tablet Take 650 mg by mouth 2 (two) times daily as needed for pain.     . ferrous sulfate 325 (65 FE) MG tablet Take 325 mg by mouth 2 (two) times daily.     Marland Kitchen FLUoxetine (PROZAC) 40 MG capsule Take 40 mg by mouth daily.     . hydrALAZINE (APRESOLINE) 50 MG tablet Take by mouth.    . lovastatin (MEVACOR) 20 MG tablet Take 20 mg by mouth at bedtime.    . metFORMIN (GLUCOPHAGE) 500 MG tablet Take 500 mg by mouth 2 (two) times daily.     . metoprolol succinate (TOPROL-XL) 25 MG 24 hr tablet Take 25 mg by mouth daily.    . nitroGLYCERIN (NITROSTAT) 0.4 MG SL tablet Place 0.4 mg under the tongue every 5 (five) minutes as needed for chest pain.    . OXYGEN Place 2 L into the nose Nightly.    . pantoprazole (PROTONIX) 40 MG tablet Take 40 mg by mouth daily.     . Polyethylene Glycol 3350 (MIRALAX PO) Take 17 g by mouth daily.    . ranolazine (RANEXA) 500 MG 12 hr tablet Take 500 mg by mouth 2 (two) times daily.     . tamsulosin (FLOMAX) 0.4 MG CAPS capsule Take 1 capsule (0.4 mg total) by mouth daily after supper. 90 capsule 3  . Tiotropium Bromide Monohydrate (SPIRIVA RESPIMAT) 2.5 MCG/ACT AERS Inhale 2 puffs into the lungs See admin instructions. Inhale 2 puffs 1 to 2 times a day.    . VENTOLIN HFA 108 (90 BASE) MCG/ACT inhaler Inhale 1-2 puffs into the lungs as needed for wheezing or shortness of breath. Reported on 12/16/2015    . amiodarone (PACERONE) 400 MG tablet Take 1 tablet (400 mg total) by mouth daily. 30 tablet 1  . apixaban (ELIQUIS) 5 MG TABS tablet Take 1 tablet (5 mg total) by mouth 2 (two) times daily. 60 tablet 1  . aspirin EC 81 MG tablet Take 81 mg by mouth daily.    . furosemide (LASIX) 40 MG tablet Take 1 tablet (40 mg total) by mouth daily. (Patient not taking: Reported on 07/03/2019) 10 tablet 0  .  insulin glargine (LANTUS) 100 UNIT/ML injection Inject 15-18 Units into the skin at bedtime.     . isosorbide mononitrate (IMDUR) 30 MG 24 hr tablet Take 1 tablet (30 mg total) by mouth daily. (Patient  not taking: Reported on 07/03/2019) 30 tablet 0  . lisinopril (PRINIVIL,ZESTRIL) 2.5 MG tablet Take 2.5 mg by mouth daily.     No current facility-administered medications for this visit.       REVIEW OF SYSTEMS (Negative unless checked)  Constitutional: [] Weight loss  [] Fever  [] Chills Cardiac: [] Chest pain   [] Chest pressure   [] Palpitations   [] Shortness of breath when laying flat   [x] Shortness of breath at rest   [x] Shortness of breath with exertion. Vascular:  [] Pain in legs with walking   [] Pain in legs at rest   [] Pain in legs when laying flat   [] Claudication   [] Pain in feet when walking  [] Pain in feet at rest  [] Pain in feet when laying flat   [] History of DVT   [] Phlebitis   [] Swelling in legs   [] Varicose veins   [] Non-healing ulcers Pulmonary:   [] Uses home oxygen   [] Productive cough   [] Hemoptysis   [] Wheeze  [x] COPD   [] Asthma Neurologic:  [x] Dizziness  [] Blackouts   [] Seizures   [] History of stroke   [] History of TIA  [] Aphasia   [x] Temporary blindness   [] Dysphagia   [] Weakness or numbness in arms   [] Weakness or numbness in legs Musculoskeletal:  [x] Arthritis   [] Joint swelling   [x] Joint pain   [] Low back pain Hematologic:  [] Easy bruising  [] Easy bleeding   [] Hypercoagulable state   [] Anemic  [] Hepatitis Gastrointestinal:  [] Blood in stool   [] Vomiting blood  [] Gastroesophageal reflux/heartburn   [] Abdominal pain Genitourinary:  [] Chronic kidney disease   [] Difficult urination  [] Frequent urination  [] Burning with urination   [] Hematuria Skin:  [] Rashes   [] Ulcers   [] Wounds Psychological:  [] History of anxiety   []  History of major depression.    Physical Exam BP (!) 109/53 (BP Location: Right Arm)   Pulse 77   Resp 16   Ht 6\' 2"  (1.88 m)   Wt 219 lb 9.6 oz (99.6 kg)    BMI 28.19 kg/m  Gen:  WD/WN, NAD.  Appears younger than stated age Head: [place]/AT, No temporalis wasting.  Ear/Nose/Throat: Hearing diminished, nares w/o erythema or drainage, oropharynx w/o Erythema/Exudate Eyes: Conjunctiva clear, sclera non-icteric  Neck: trachea midline.  Right carotid bruit is present Pulmonary:  Good air movement, clear to auscultation bilaterally.  Cardiac: irregular Vascular:  Vessel Right Left  Radial Palpable Palpable                          PT  1+ palpable  1+ palpable  DP  1+ palpable Palpable   Gastrointestinal: soft, non-tender/non-distended.  Musculoskeletal: M/S 5/5 throughout.  Extremities without ischemic changes.  No deformity or atrophy.  Mild lower extremity edema. Neurologic: Sensation grossly intact in extremities.  Symmetrical.  Speech is fluent. Motor exam as listed above. Psychiatric: Judgment intact, Mood & affect appropriate for pt's clinical situation. Dermatologic: No rashes or ulcers noted.  No cellulitis or open wounds.  Radiology No results found.  Labs No results found for this or any previous visit (from the past 2160 hour(s)).  Assessment/Plan:  HTN (hypertension) blood pressure control important in reducing the progression of atherosclerotic disease. On appropriate oral medications.   Atrial fibrillation with RVR ([name]) Rate controlled and on anticoagulation  Diabetes ([name]) blood glucose control important in reducing the progression of atherosclerotic disease. Also, involved in wound healing. On appropriate medications.   HLD (hyperlipidemia) lipid control important in reducing the progression of atherosclerotic disease. Continue statin  therapy   Symptomatic carotid artery stenosis a recent carotid duplex was performed demonstrating greater than 70% right ICA stenosis and 50 to 70% left ICA stenosis. We had a long discussion today with he and his son regarding our options for treatment.  Despite his advanced  age, he remains independent and active.  The temporary monocular blindness was likely a symptom of his carotid disease.  The dizziness may or may not be a symptom of his carotid disease.  He had a prolonged recovery after cholecystectomy and spent some time in rehab.  He was initially not felt to be a surgical candidate although he did eventually pull-through.  If his anatomy is acceptable for treatment with carotid artery stenting, this would likely be his best option.  He is clearly a high risk patient and was symptomatic.  We got several limitations of carotid stenting including tortuosity, aortic arch anatomy, and severe calcification that could be encountered at the time of angiography and which would lead to a diagnostic study and subsequent carotid endarterectomy.  If these limitations are not seen, right carotid artery stent placement would likely be his best option.  I have discussed the risks and benefits of this procedure.  I discussed that this is performed largely for stroke reduction risk and I cannot promise that his dizziness will get better.  He and his son voiced their understanding and are agreeable to proceed with right carotid stent placement.      Leotis Pain 07/03/2019, 4:13 PM   This note was created with Dragon medical transcription system.  Any errors from dictation are unintentional.

## 2019-07-03 NOTE — Assessment & Plan Note (Signed)
blood glucose control important in reducing the progression of atherosclerotic disease. Also, involved in wound healing. On appropriate medications.  

## 2019-07-03 NOTE — Assessment & Plan Note (Signed)
a recent carotid duplex was performed demonstrating greater than 70% right ICA stenosis and 50 to 70% left ICA stenosis. We had a long discussion today with he and his son regarding our options for treatment.  Despite his advanced age, he remains independent and active.  The temporary monocular blindness was likely a symptom of his carotid disease.  The dizziness may or may not be a symptom of his carotid disease.  He had a prolonged recovery after cholecystectomy and spent some time in rehab.  He was initially not felt to be a surgical candidate although he did eventually pull-through.  If his anatomy is acceptable for treatment with carotid artery stenting, this would likely be his best option.  He is clearly a high risk patient and was symptomatic.  We got several limitations of carotid stenting including tortuosity, aortic arch anatomy, and severe calcification that could be encountered at the time of angiography and which would lead to a diagnostic study and subsequent carotid endarterectomy.  If these limitations are not seen, right carotid artery stent placement would likely be his best option.  I have discussed the risks and benefits of this procedure.  I discussed that this is performed largely for stroke reduction risk and I cannot promise that his dizziness will get better.  He and his son voiced their understanding and are agreeable to proceed with right carotid stent placement.

## 2019-07-03 NOTE — Patient Instructions (Signed)
Carotid Angioplasty With Stent Carotid angioplasty with stent is a procedure to open or widen an artery in the neck (carotid artery) that has become narrowed. This is done by inflating a small balloon inside the artery and then placing a small piece of metal that looks like a coil or spring (stent) inside the artery. The stent helps keep the artery open by supporting the artery walls. The carotid arteries supply blood to the brain. When fats, cholesterol, and other materials (plaque) build up in an artery, the artery becomes narrow and can become blocked. This can reduce or block blood flow to certain areas of the brain, which can cause serious health problems, including stroke. Tell a health care provider about:  Any allergies you have.  All medicines you are taking, including vitamins, herbs, eye drops, creams, and over-the-counter medicines.  Any problems you or family members have had with anesthetic medicines.  Any blood disorders you have.  Any surgeries you have had.  Any medical conditions you have.  Whether you are pregnant or may be pregnant. What are the risks? Generally, this is a safe procedure. However, problems may occur, including:  Infection.  Bleeding.  Allergic reactions to medicines or dyes.  Damage to other structures or organs, or to the carotid artery itself.  The carotid artery becoming blocked again.  A collection of blood under the skin (hematoma) around the stent site that gets larger.  A blood clot in another part of the body.  Kidney injury.  Stroke.  Heart attack. What happens before the procedure?  Ask your health care provider about: ? Changing or stopping your regular medicines. This is especially important if you are taking diabetes medicines or blood thinners. ? Whether aspirin is recommended before this procedure. ? Taking over-the-counter medicines, vitamins, herbs, and supplements.  Follow instructions from your health care provider  about eating or drinking restrictions.  Do not use any products that contain nicotine or tobacco for 4 weeks before the procedure. These products include cigarettes, e-cigarettes, and chewing tobacco. If you need help quitting, ask your health care provider.  Ask your health care provider what steps will be taken to help prevent infection. These may include: ? Removing hair at the surgery site. ? Washing skin with a germ-killing soap. ? Taking antibiotic medicine.  You may have blood tests and imaging tests done.  Plan to have someone take you home from the hospital or clinic.  If you will be going home right after the procedure, plan to have someone with you for 24 hours. What happens during the procedure?   An IV will be inserted into one of your veins.  You may be given one or more of the following: ? A medicine to help you relax (sedative). ? A medicine to numb the area where the catheter will be inserted (local anesthetic).  Most commonly, an incision will be made in your groin. In some cases, an incision may be made in your wrist or forearm instead of your groin.  A small, thin tube (catheter) will be inserted through your incision, into an artery. The catheter will be threaded upward into your carotid artery. An X-ray machine (fluoroscope) will help your health care provider guide the catheter to the correct place in your artery.  Dye will be injected into the catheter and will travel to the narrow or blocked part of your carotid artery.  X-ray images will be taken of how the dye flows through your artery. While the images   are being taken, you may be given instructions about breathing, swallowing, moving, or talking.  A filter (distal protection device) will be inserted into your artery. This will be used to catch plaque that comes loose in your artery during the procedure. This reduces the risk of plaque moving into your brain.  A small balloon will be inserted into your  artery. The balloon will be inflated for a few seconds to widen your artery and will then be removed.  The stent will be placed in your artery.  A second small balloon will be inserted into your artery and inflated. This expands the stent inside of your artery so that the stent holds up the artery walls. The balloon will then be removed.  The catheter and the distal protection device will be removed from your artery.  Your incision may be closed with stitches (sutures), skin glue, or adhesive tape.  A bandage (dressing) will be placed over your incision. The procedure may vary among health care providers and hospitals. What happens after the procedure?  Your blood pressure, heart rate, breathing rate, and blood oxygen level will be monitored until you leave the hospital or clinic.  You may continue to receive fluids and medicines through an IV.  You may need to have pressure placed on the incision site to prevent bleeding.  You will need to keep the area still for a few hours, or as long as directed by your health care provider. If the procedure was done in the groin, you will be instructed not to bend or cross your legs.  You may have some pain. Pain medicines will be available to help you.  You may have a test that uses sound waves to take pictures (ultrasound) of the carotid artery. This can be compared to future tests to check for changes in the artery.  Do not drive for 24 hours. Summary  Carotid angioplasty with stent is a procedure to open or widen an artery in the neck (carotid artery) that has become narrowed.  The procedure is done to lower the risk of problems that can result from reduced blood flow to the brain, including a stroke.  The stent placed inside the artery will help keep the artery open by supporting the artery walls.  Follow instructions from your health care provider about taking medicines and about eating and drinking before the procedure. This  information is not intended to replace advice given to you by your health care provider. Make sure you discuss any questions you have with your health care provider. Document Released: 03/29/2005 Document Revised: 09/12/2018 Document Reviewed: 09/07/2018 Elsevier Patient Education  2020 [employer].

## 2019-07-03 NOTE — Assessment & Plan Note (Signed)
Rate controlled and on anticoagulation. 

## 2019-07-03 NOTE — Assessment & Plan Note (Signed)
blood pressure control important in reducing the progression of atherosclerotic disease. On appropriate oral medications.  

## 2019-07-06 ENCOUNTER — Telehealth (INDEPENDENT_AMBULATORY_CARE_PROVIDER_SITE_OTHER): Payer: Self-pay

## 2019-07-06 NOTE — Telephone Encounter (Signed)
Spoke with the patients son Justin Leonard and the patient is now scheduled for his procedure with Dr. Lucky Cowboy on 07/12/2019 with a 8:45 am arrival time to the [facility]. Patient will do his pre-op and Covid testing on 07/10/2019 at 2:45 pm at the [facility]. Pre-procedure instructions were discussed and this information will be mailed to the patient.

## 2019-07-09 ENCOUNTER — Other Ambulatory Visit: Admission: RE | Admit: 2019-07-09 | Payer: Medicare Other | Source: Ambulatory Visit

## 2019-07-09 ENCOUNTER — Other Ambulatory Visit (INDEPENDENT_AMBULATORY_CARE_PROVIDER_SITE_OTHER): Payer: Self-pay | Admitting: Nurse Practitioner

## 2019-07-10 ENCOUNTER — Other Ambulatory Visit: Payer: Self-pay

## 2019-07-10 ENCOUNTER — Encounter
Admission: RE | Admit: 2019-07-10 | Discharge: 2019-07-10 | Disposition: A | Discharge status: Home / Self Care | Payer: Medicare Other | Source: Ambulatory Visit | Attending: Vascular Surgery | Admitting: Vascular Surgery

## 2019-07-10 HISTORY — DX: Unspecified atrial fibrillation: I48.91

## 2019-07-10 LAB — CREATININE, SERUM
Creatinine, Ser: 1.29 mg/dL — ABNORMAL HIGH (ref 0.61–1.24)
GFR calc Af Amer: 56 mL/min — ABNORMAL LOW (ref >60)
GFR calc non Af Amer: 48 mL/min — ABNORMAL LOW (ref >60)

## 2019-07-10 LAB — BUN: BUN: 24 mg/dL — ABNORMAL HIGH (ref 8–23)

## 2019-07-11 ENCOUNTER — Ambulatory Visit: Payer: Medicare Other | Admitting: Urology

## 2019-07-11 LAB — SARS CORONAVIRUS 2 (TAT 6-24 HRS): SARS Coronavirus 2: NEGATIVE

## 2019-07-12 ENCOUNTER — Other Ambulatory Visit: Payer: Self-pay

## 2019-07-12 ENCOUNTER — Inpatient Hospital Stay
Admission: RE | Admit: 2019-07-12 | Discharge: 2019-07-13 | DRG: 036 | Disposition: A | Discharge status: Home / Self Care | Payer: Medicare Other | Attending: Vascular Surgery | Admitting: Vascular Surgery

## 2019-07-12 ENCOUNTER — Encounter
Admission: RE | Disposition: A | Discharge status: Home / Self Care | Payer: Self-pay | Source: Home / Self Care | Attending: Vascular Surgery

## 2019-07-12 DIAGNOSIS — I252 Old myocardial infarction: Secondary | ICD-10-CM

## 2019-07-12 DIAGNOSIS — Z8673 Personal history of transient ischemic attack (TIA), and cerebral infarction without residual deficits: Secondary | ICD-10-CM

## 2019-07-12 DIAGNOSIS — Z9981 Dependence on supplemental oxygen: Secondary | ICD-10-CM | POA: Diagnosis not present

## 2019-07-12 DIAGNOSIS — J449 Chronic obstructive pulmonary disease, unspecified: Secondary | ICD-10-CM | POA: Diagnosis present

## 2019-07-12 DIAGNOSIS — I10 Essential (primary) hypertension: Secondary | ICD-10-CM | POA: Diagnosis present

## 2019-07-12 DIAGNOSIS — Z7902 Long term (current) use of antithrombotics/antiplatelets: Secondary | ICD-10-CM

## 2019-07-12 DIAGNOSIS — Z85828 Personal history of other malignant neoplasm of skin: Secondary | ICD-10-CM

## 2019-07-12 DIAGNOSIS — Z79899 Other long term (current) drug therapy: Secondary | ICD-10-CM | POA: Diagnosis not present

## 2019-07-12 DIAGNOSIS — Z20828 Contact with and (suspected) exposure to other viral communicable diseases: Secondary | ICD-10-CM | POA: Diagnosis present

## 2019-07-12 DIAGNOSIS — E785 Hyperlipidemia, unspecified: Secondary | ICD-10-CM | POA: Diagnosis present

## 2019-07-12 DIAGNOSIS — F329 Major depressive disorder, single episode, unspecified: Secondary | ICD-10-CM | POA: Diagnosis present

## 2019-07-12 DIAGNOSIS — I2510 Atherosclerotic heart disease of native coronary artery without angina pectoris: Secondary | ICD-10-CM | POA: Diagnosis present

## 2019-07-12 DIAGNOSIS — E1142 Type 2 diabetes mellitus with diabetic polyneuropathy: Secondary | ICD-10-CM | POA: Diagnosis present

## 2019-07-12 DIAGNOSIS — K219 Gastro-esophageal reflux disease without esophagitis: Secondary | ICD-10-CM | POA: Diagnosis present

## 2019-07-12 DIAGNOSIS — Z7984 Long term (current) use of oral hypoglycemic drugs: Secondary | ICD-10-CM | POA: Diagnosis not present

## 2019-07-12 DIAGNOSIS — H9190 Unspecified hearing loss, unspecified ear: Secondary | ICD-10-CM | POA: Diagnosis present

## 2019-07-12 DIAGNOSIS — I6523 Occlusion and stenosis of bilateral carotid arteries: Secondary | ICD-10-CM | POA: Diagnosis present

## 2019-07-12 DIAGNOSIS — Z7951 Long term (current) use of inhaled steroids: Secondary | ICD-10-CM

## 2019-07-12 DIAGNOSIS — I6521 Occlusion and stenosis of right carotid artery: Principal | ICD-10-CM | POA: Diagnosis present

## 2019-07-12 HISTORY — PX: CAROTID PTA/STENT INTERVENTION: CATH118231

## 2019-07-12 LAB — GLUCOSE, CAPILLARY: Glucose-Capillary: 140 mg/dL — ABNORMAL HIGH (ref 70–99)

## 2019-07-12 LAB — MRSA PCR SCREENING: MRSA by PCR: NEGATIVE

## 2019-07-12 SURGERY — CAROTID PTA/STENT INTERVENTION
Anesthesia: Moderate Sedation | Laterality: Right

## 2019-07-12 MED ORDER — OXYCODONE-ACETAMINOPHEN 5-325 MG PO TABS: 1.0000 | ORAL_TABLET | ORAL | Status: DC | PRN

## 2019-07-12 MED ORDER — FLUOXETINE HCL 20 MG PO CAPS
40.0000 mg | ORAL_CAPSULE | Freq: Every day | ORAL | Status: DC
  Filled 2019-07-12 (×2): qty 2

## 2019-07-12 MED ORDER — FENTANYL CITRATE (PF) 100 MCG/2ML IJ SOLN
INTRAMUSCULAR | Status: AC
  Filled 2019-07-12: qty 2

## 2019-07-12 MED ORDER — CHLORHEXIDINE GLUCONATE CLOTH 2 % EX PADS
6.0000 | MEDICATED_PAD | Freq: Every day | CUTANEOUS | Status: DC
  Administered 2019-07-12: 6 via TOPICAL

## 2019-07-12 MED ORDER — HEPARIN SODIUM (PORCINE) 1000 UNIT/ML IJ SOLN
INTRAMUSCULAR | Status: DC | PRN
  Administered 2019-07-12: 4000 [IU] via INTRAVENOUS
  Administered 2019-07-12: 6000 [IU] via INTRAVENOUS

## 2019-07-12 MED ORDER — CEFAZOLIN SODIUM-DEXTROSE 2-4 GM/100ML-% IV SOLN
INTRAVENOUS | Status: AC
  Filled 2019-07-12: qty 100

## 2019-07-12 MED ORDER — PANTOPRAZOLE SODIUM 40 MG PO TBEC
40.0000 mg | DELAYED_RELEASE_TABLET | Freq: Every day | ORAL | Status: DC
  Administered 2019-07-12: 40 mg via ORAL
  Filled 2019-07-12: qty 1

## 2019-07-12 MED ORDER — NITROGLYCERIN 0.4 MG SL SUBL: 0.4000 mg | SUBLINGUAL_TABLET | SUBLINGUAL | Status: DC | PRN

## 2019-07-12 MED ORDER — ALUM & MAG HYDROXIDE-SIMETH 200-200-20 MG/5ML PO SUSP: 15.0000 mL | ORAL | Status: DC | PRN

## 2019-07-12 MED ORDER — FAMOTIDINE 20 MG PO TABS
40.0000 mg | ORAL_TABLET | Freq: Once | ORAL | Status: AC | PRN
  Administered 2019-07-12: 40 mg via ORAL

## 2019-07-12 MED ORDER — HYDROMORPHONE HCL 1 MG/ML IJ SOLN: 1.0000 mg | Freq: Once | INTRAMUSCULAR | Status: DC | PRN

## 2019-07-12 MED ORDER — IODIXANOL 320 MG/ML IV SOLN
INTRAVENOUS | Status: DC | PRN
  Administered 2019-07-12: 110 mL via INTRA_ARTERIAL

## 2019-07-12 MED ORDER — TAMSULOSIN HCL 0.4 MG PO CAPS
0.4000 mg | ORAL_CAPSULE | Freq: Every day | ORAL | Status: DC
  Filled 2019-07-12: qty 1

## 2019-07-12 MED ORDER — ONDANSETRON HCL 4 MG/2ML IJ SOLN: 4.0000 mg | Freq: Four times a day (QID) | INTRAMUSCULAR | Status: DC | PRN

## 2019-07-12 MED ORDER — PRAVASTATIN SODIUM 20 MG PO TABS
20.0000 mg | ORAL_TABLET | Freq: Every day | ORAL | Status: DC
  Filled 2019-07-12: qty 1

## 2019-07-12 MED ORDER — ALBUTEROL SULFATE (2.5 MG/3ML) 0.083% IN NEBU: 2.5000 mg | INHALATION_SOLUTION | Freq: Four times a day (QID) | RESPIRATORY_TRACT | Status: DC | PRN

## 2019-07-12 MED ORDER — SODIUM CHLORIDE 0.9 % IV SOLN
INTRAVENOUS | Status: DC
  Administered 2019-07-12: 09:00:00 via INTRAVENOUS

## 2019-07-12 MED ORDER — ACETAMINOPHEN ER 650 MG PO TBCR: 650.0000 mg | EXTENDED_RELEASE_TABLET | Freq: Two times a day (BID) | ORAL | Status: DC | PRN

## 2019-07-12 MED ORDER — MIDAZOLAM HCL 5 MG/5ML IJ SOLN
INTRAMUSCULAR | Status: AC
  Filled 2019-07-12: qty 5

## 2019-07-12 MED ORDER — DIPHENHYDRAMINE HCL 50 MG/ML IJ SOLN
INTRAMUSCULAR | Status: AC
  Administered 2019-07-12: 25 mg via INTRAVENOUS
  Filled 2019-07-12: qty 1

## 2019-07-12 MED ORDER — TIOTROPIUM BROMIDE MONOHYDRATE 18 MCG IN CAPS
1.0000 | ORAL_CAPSULE | Freq: Every day | RESPIRATORY_TRACT | Status: DC
  Filled 2019-07-12: qty 5

## 2019-07-12 MED ORDER — POLYETHYLENE GLYCOL 3350 17 G PO PACK
17.0000 g | PACK | Freq: Every day | ORAL | Status: DC
  Administered 2019-07-12: 17 g via ORAL
  Filled 2019-07-12: qty 1

## 2019-07-12 MED ORDER — CLOPIDOGREL BISULFATE 75 MG PO TABS
75.0000 mg | ORAL_TABLET | Freq: Every day | ORAL | Status: DC
  Administered 2019-07-12: 17:00:00 75 mg via ORAL
  Filled 2019-07-12: qty 1

## 2019-07-12 MED ORDER — MORPHINE SULFATE (PF) 2 MG/ML IV SOLN: 2.0000 mg | INTRAVENOUS | Status: DC | PRN

## 2019-07-12 MED ORDER — PHENOL 1.4 % MT LIQD
1.0000 | OROMUCOSAL | Status: DC | PRN
  Filled 2019-07-12: qty 177

## 2019-07-12 MED ORDER — HYDRALAZINE HCL 50 MG PO TABS
50.0000 mg | ORAL_TABLET | Freq: Two times a day (BID) | ORAL | Status: DC
  Administered 2019-07-12 (×2): 50 mg via ORAL
  Filled 2019-07-12 (×2): qty 1

## 2019-07-12 MED ORDER — DIPHENHYDRAMINE HCL 50 MG/ML IJ SOLN: 50.0000 mg | Freq: Once | INTRAMUSCULAR | Status: DC | PRN

## 2019-07-12 MED ORDER — ACETAMINOPHEN 325 MG PO TABS
325.0000 mg | ORAL_TABLET | ORAL | Status: DC | PRN
  Administered 2019-07-12: 650 mg via ORAL
  Filled 2019-07-12: qty 2

## 2019-07-12 MED ORDER — TRAMADOL HCL 50 MG PO TABS: 50.0000 mg | ORAL_TABLET | Freq: Four times a day (QID) | ORAL | Status: DC | PRN

## 2019-07-12 MED ORDER — PHENYLEPHRINE HCL (PRESSORS) 10 MG/ML IV SOLN
INTRAVENOUS | Status: AC
  Filled 2019-07-12: qty 1

## 2019-07-12 MED ORDER — CEFAZOLIN SODIUM-DEXTROSE 2-4 GM/100ML-% IV SOLN
2.0000 g | Freq: Three times a day (TID) | INTRAVENOUS | Status: AC
  Administered 2019-07-12 – 2019-07-13 (×2): 2 g via INTRAVENOUS
  Filled 2019-07-12 (×2): qty 100

## 2019-07-12 MED ORDER — TRAMADOL HCL 50 MG PO TABS: 100.0000 mg | ORAL_TABLET | Freq: Four times a day (QID) | ORAL | Status: DC | PRN

## 2019-07-12 MED ORDER — SODIUM CHLORIDE 0.9 % IV SOLN
INTRAVENOUS | Status: DC
  Administered 2019-07-12 – 2019-07-13 (×2): via INTRAVENOUS

## 2019-07-12 MED ORDER — METOPROLOL TARTRATE 5 MG/5ML IV SOLN: 2.0000 mg | INTRAVENOUS | Status: DC | PRN

## 2019-07-12 MED ORDER — FENTANYL CITRATE (PF) 100 MCG/2ML IJ SOLN
INTRAMUSCULAR | Status: DC | PRN
  Administered 2019-07-12: 50 ug via INTRAVENOUS

## 2019-07-12 MED ORDER — ACETAMINOPHEN 650 MG RE SUPP: 325.0000 mg | RECTAL | Status: DC | PRN

## 2019-07-12 MED ORDER — SODIUM CHLORIDE 0.9 % IV SOLN: 500.0000 mL | Freq: Once | INTRAVENOUS | Status: DC | PRN

## 2019-07-12 MED ORDER — DIPHENHYDRAMINE HCL 50 MG/ML IJ SOLN
25.0000 mg | Freq: Once | INTRAMUSCULAR | Status: AC
  Administered 2019-07-12: 09:00:00 25 mg via INTRAVENOUS

## 2019-07-12 MED ORDER — HEPARIN SODIUM (PORCINE) 1000 UNIT/ML IJ SOLN
INTRAMUSCULAR | Status: AC
  Filled 2019-07-12: qty 1

## 2019-07-12 MED ORDER — POTASSIUM CHLORIDE CRYS ER 20 MEQ PO TBCR: 20.0000 meq | EXTENDED_RELEASE_TABLET | Freq: Every day | ORAL | Status: DC | PRN

## 2019-07-12 MED ORDER — FAMOTIDINE IN NACL 20-0.9 MG/50ML-% IV SOLN
20.0000 mg | Freq: Two times a day (BID) | INTRAVENOUS | Status: DC
  Administered 2019-07-12: 20 mg via INTRAVENOUS
  Filled 2019-07-12: qty 50

## 2019-07-12 MED ORDER — FERROUS SULFATE 325 (65 FE) MG PO TABS
325.0000 mg | ORAL_TABLET | Freq: Two times a day (BID) | ORAL | Status: DC
  Administered 2019-07-12 (×2): 325 mg via ORAL
  Filled 2019-07-12 (×2): qty 1

## 2019-07-12 MED ORDER — MIDAZOLAM HCL 2 MG/2ML IJ SOLN
INTRAMUSCULAR | Status: DC | PRN
  Administered 2019-07-12: 1 mg via INTRAVENOUS

## 2019-07-12 MED ORDER — GUAIFENESIN-DM 100-10 MG/5ML PO SYRP: 15.0000 mL | ORAL_SOLUTION | ORAL | Status: DC | PRN

## 2019-07-12 MED ORDER — CEFAZOLIN SODIUM-DEXTROSE 2-4 GM/100ML-% IV SOLN
2.0000 g | Freq: Once | INTRAVENOUS | Status: AC
  Administered 2019-07-12: 2 g via INTRAVENOUS

## 2019-07-12 MED ORDER — ATROPINE SULFATE 1 MG/10ML IJ SOSY
PREFILLED_SYRINGE | INTRAMUSCULAR | Status: AC
  Administered 2019-07-12: 0.4 mg
  Filled 2019-07-12: qty 20

## 2019-07-12 MED ORDER — METOPROLOL SUCCINATE ER 25 MG PO TB24
12.5000 mg | ORAL_TABLET | Freq: Every day | ORAL | Status: DC
  Administered 2019-07-12: 12.5 mg via ORAL
  Filled 2019-07-12 (×2): qty 0.5

## 2019-07-12 MED ORDER — METHYLPREDNISOLONE SODIUM SUCC 125 MG IJ SOLR: 125.0000 mg | Freq: Once | INTRAMUSCULAR | Status: DC | PRN

## 2019-07-12 MED ORDER — MAGNESIUM SULFATE 2 GM/50ML IV SOLN: 2.0000 g | Freq: Every day | INTRAVENOUS | Status: DC | PRN

## 2019-07-12 MED ORDER — ESMOLOL HCL-SODIUM CHLORIDE 2000 MG/100ML IV SOLN
INTRAVENOUS | Status: AC
  Filled 2019-07-12: qty 100

## 2019-07-12 MED ORDER — POLYETHYLENE GLYCOL 3350 17 GM/SCOOP PO POWD
17.0000 g | Freq: Every day | ORAL | Status: DC
  Filled 2019-07-12: qty 255

## 2019-07-12 MED ORDER — LISINOPRIL 5 MG PO TABS
2.5000 mg | ORAL_TABLET | Freq: Every day | ORAL | Status: DC
  Administered 2019-07-12: 2.5 mg via ORAL
  Filled 2019-07-12: qty 1

## 2019-07-12 MED ORDER — HYDRALAZINE HCL 20 MG/ML IJ SOLN: 5.0000 mg | INTRAMUSCULAR | Status: DC | PRN

## 2019-07-12 MED ORDER — RANOLAZINE ER 500 MG PO TB12
500.0000 mg | ORAL_TABLET | Freq: Two times a day (BID) | ORAL | Status: DC
  Administered 2019-07-12 (×2): 500 mg via ORAL
  Filled 2019-07-12 (×4): qty 1

## 2019-07-12 MED ORDER — LABETALOL HCL 5 MG/ML IV SOLN: 10.0000 mg | INTRAVENOUS | Status: DC | PRN

## 2019-07-12 MED ORDER — MIDAZOLAM HCL 2 MG/ML PO SYRP: 8.0000 mg | ORAL_SOLUTION | Freq: Once | ORAL | Status: DC | PRN

## 2019-07-12 MED ORDER — DOPAMINE-DEXTROSE 3.2-5 MG/ML-% IV SOLN
INTRAVENOUS | Status: AC
  Filled 2019-07-12: qty 250

## 2019-07-12 MED ORDER — FAMOTIDINE 20 MG PO TABS
ORAL_TABLET | ORAL | Status: AC
  Administered 2019-07-12: 40 mg via ORAL
  Filled 2019-07-12: qty 2

## 2019-07-12 SURGICAL SUPPLY — 21 items
BALLN VTRAC 4.5X30X135 (BALLOONS) ×3
BALLOON VTRAC 4.5X30X135 (BALLOONS) ×1 IMPLANT
CATH ANGIO 5F 100CM .035 PIG (CATHETERS) ×3 IMPLANT
CATH BEACON 5 .035 100 H1 TIP (CATHETERS) ×3 IMPLANT
CATH BEACON 5 .035 100 JB2 TIP (CATHETERS) ×3 IMPLANT
CATH G 5FX100 (CATHETERS) ×3 IMPLANT
CATH SIM1 100CM (CATHETERS) ×3 IMPLANT
DEVICE EMBOSHIELD NAV6 4.0-7.0 (FILTER) ×6 IMPLANT
DEVICE PRESTO INFLATION (MISCELLANEOUS) ×3 IMPLANT
DEVICE SAFEGUARD 24CM (GAUZE/BANDAGES/DRESSINGS) ×3 IMPLANT
DEVICE STARCLOSE SE CLOSURE (Vascular Products) ×3 IMPLANT
DEVICE TORQUE .025-.038 (MISCELLANEOUS) ×3 IMPLANT
GLIDEWIRE ANGLED SS 035X260CM (WIRE) ×3 IMPLANT
KIT CAROTID MANIFOLD (MISCELLANEOUS) ×3 IMPLANT
PACK ANGIOGRAPHY (CUSTOM PROCEDURE TRAY) ×3 IMPLANT
SHEATH BRITE TIP 6FRX11 (SHEATH) ×3 IMPLANT
SHEATH SHUTTLE SELECT 6F (SHEATH) ×3 IMPLANT
STENT XACT CAR 9-7X40X136 (Permanent Stent) ×3 IMPLANT
TUBING CONTRAST HIGH PRESS 72 (TUBING) ×3 IMPLANT
WIRE G VAS 035X260 STIFF (WIRE) ×3 IMPLANT
WIRE J 3MM .035X145CM (WIRE) ×3 IMPLANT

## 2019-07-12 NOTE — Op Note (Signed)
OPERATIVE NOTE DATE: 07/12/2019  PROCEDURE: 1.  Ultrasound guidance for vascular access right femoral artery 2.  Placement of a 9 mm proximal 7 mm distal 4 cm Exact stent with the use of the NAV-6 embolic protection device in the right carotid artery  PRE-OPERATIVE DIAGNOSIS: 1. High grade right carotid artery stenosis. 2.  Previous TIA symptoms 3.  COPD with home oxygen use at night  POST-OPERATIVE DIAGNOSIS:  Same as above  SURGEON: Leotis Pain, MD  ASSISTANT(S): None  ANESTHESIA: local/MCS  ESTIMATED BLOOD LOSS: 30 cc  CONTRAST: 110 cc  FLUORO TIME: 15.9 minutes  MODERATE CONSCIOUS SEDATION TIME:  Approximately 60 minutes using 1 mg of Versed and 50 mcg of Fentanyl  FINDING(S): 1.   >90% right carotid artery stenosis  SPECIMEN(S):   none  INDICATIONS:   Patient is a 85 y.o. male who presents with right carotid artery stenosis.  The patient has severe disease with multiple medical comorbidities and recent symptoms worrisome for focal neurologic symptoms and carotid artery stenting was felt to be preferred to endarterectomy for that reason.  Risks and benefits were discussed and informed consent was obtained.   DESCRIPTION: After obtaining full informed written consent, the patient was brought back to the vascular suite and placed supine upon the table.  The patient received IV antibiotics prior to induction. Moderate conscious sedation was administered during a face to face encounter with the patient throughout the procedure with my supervision of the RN administering medicines and monitoring the patients vital signs and mental status throughout from the start of the procedure until the patient was taken to the recovery room.  After obtaining adequate anesthesia, the patient was prepped and draped in the standard fashion.   The right femoral artery was visualized with ultrasound and found to be widely patent. It was then accessed under direct ultrasound guidance without  difficulty with a Seldinger needle. A permanent image was recorded. A J-wire was placed and we then placed a 6 French sheath. The patient was then heparinized and a total of 6000 units of intravenous heparin were given and an ACT was checked.  This was subtherapeutic and an additional 4000 units of heparin were given. A pigtail catheter was then placed into the ascending aorta. 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 I then selectively cannulated the innominate with significant difficulty with a headhunter and then a JB2 catheter but these would not advance into the common carotid artery.  I was able to advance a Bentson Hanafee catheter all the way into the external iliac artery, but when I try to put a stiff wire and this pulled back into the aorta.  With the Bentson Hanafee catheter up I was able to get imaging which showed a greater than 90 to 95% right ICA stenosis about 1 cm above the bifurcation with irregularity and moderate stenosis at the bifurcation.  The intracranial circulation showed decent flow in the anterior cerebral artery but no flow in the middle cerebral artery.  At this point, we tried a Simmons 1 catheter and surprisingly this tracked much better.  I then advanced into the external carotid artery with a Glidewire and the Simmons catheter and then exchanged for the Amplatz Super Stiff wire. Over the Amplatz Super Stiff wire, a 6 [place] shuttle sheath was placed into the mid common carotid artery. I then used the NAV-6  Embolic protection device and crossed the lesion and parked this in the distal internal carotid  artery at the base of the skull.  I then selected a 9 mm proximal 7 mm distal 4 cm long exact stent. This was deployed across the lesion encompassing it in its entirety. A 4.5 mm diameter by 3 cm length balloon was used to post dilate the stent. Only about a 15-20 % residual stenosis was present after angioplasty. Completion angiogram showed normal intracranial  filling without new defects. At this point I elected to terminate the procedure. The sheath was removed and StarClose closure device was deployed in the right femoral artery with excellent hemostatic result. The patient was taken to the recovery room in stable condition having tolerated the procedure well.  COMPLICATIONS: none  CONDITION: stable  Leotis Pain 07/12/2019 12:55 PM   This note was created with Dragon Medical transcription system. Any errors in dictation are purely unintentional.

## 2019-07-12 NOTE — H&P (Signed)
[facility] History & Physical Update  The patient was interviewed and re-examined.  The patient's previous History and Physical has been reviewed and is unchanged.  There is no change in the plan of care. We plan to proceed with the scheduled procedure.  Leotis Pain, MD  07/12/2019, 8:31 AM

## 2019-07-13 DIAGNOSIS — I6521 Occlusion and stenosis of right carotid artery: Principal | ICD-10-CM

## 2019-07-13 LAB — BASIC METABOLIC PANEL
Anion gap: 4 — ABNORMAL LOW (ref 5–15)
BUN: 18 mg/dL (ref 8–23)
CO2: 24 mmol/L (ref 22–32)
Calcium: 9.1 mg/dL (ref 8.9–10.3)
Chloride: 111 mmol/L (ref 98–111)
Creatinine, Ser: 0.98 mg/dL (ref 0.61–1.24)
GFR calc Af Amer: >60 mL/min (ref >60)
GFR calc non Af Amer: >60 mL/min (ref >60)
Glucose, Bld: 186 mg/dL — ABNORMAL HIGH (ref 70–99)
Potassium: 3.9 mmol/L (ref 3.5–5.1)
Sodium: 139 mmol/L (ref 135–145)

## 2019-07-13 LAB — CBC
HCT: 29.8 % — ABNORMAL LOW (ref 39.0–52.0)
Hemoglobin: 9.8 g/dL — ABNORMAL LOW (ref 13.0–17.0)
MCH: 31.4 pg (ref 26.0–34.0)
MCHC: 32.9 g/dL (ref 30.0–36.0)
MCV: 95.5 fL (ref 80.0–100.0)
Platelets: 152 10*3/uL (ref 150–400)
RBC: 3.12 MIL/uL — ABNORMAL LOW (ref 4.22–5.81)
RDW: 13.1 % (ref 11.5–15.5)
WBC: 6.6 10*3/uL (ref 4.0–10.5)
nRBC: 0 % (ref 0.0–0.2)

## 2019-07-13 LAB — GLUCOSE, CAPILLARY: Glucose-Capillary: 131 mg/dL — ABNORMAL HIGH (ref 70–99)

## 2019-07-13 LAB — POCT ACTIVATED CLOTTING TIME: Activated Clotting Time: 186 seconds

## 2019-07-13 LAB — MAGNESIUM: Magnesium: 2 mg/dL (ref 1.7–2.4)

## 2019-07-13 MED ORDER — CLOPIDOGREL BISULFATE 75 MG PO TABS: 75.0000 mg | ORAL_TABLET | Freq: Every day | ORAL | 3 refills | Status: DC

## 2019-07-13 NOTE — Discharge Instructions (Signed)
° °  Carotid Artery Disease  The carotid arteries are arteries on both sides of the neck. They carry blood to the brain, face, and neck. Carotid artery disease happens when these arteries become smaller (narrow) or get blocked. If these arteries become smaller or get blocked, you are more likely to have a stroke or a warning stroke (transient ischemic attack). Follow these instructions at home:  Take over-the-counter and prescription medicines only as told by your doctor.  Make sure you understand all instructions about your medicines. Do not stop taking your medicines without talking to your doctor first.  Follow your doctor's diet instructions. It is important to follow a healthy diet. ? Eat foods that include plenty of: ? Fresh fruits. ? Vegetables. ? Lean meats. ? Avoid these foods: ? Foods that are high in fat. ? Foods that are high in salt (sodium). ? Foods that are fried. ? Foods that are processed. ? Foods that have few good nutrients (poor nutritional value).  Keep a healthy weight.  Stay active. Get at least 30 minutes of activity every day.  Do not smoke.  Limit alcohol use to: ? No more than 2 drinks a day for men. ? No more than 1 drink a day for women who are not pregnant.  Do not use illegal drugs.  Keep all follow-up visits as told by your doctor. This is important. Contact a doctor if: Get help right away if:  You have any symptoms of stroke or TIA. The acronym BEFAST is an easy way to remember the main warning signs of stroke. ? B = Balance problems. Signs include dizziness, sudden trouble walking, or loss of balance ? E = Eye problems. This includes trouble seeing or a sudden change in vision. ? F = Face changes. This includes sudden weakness or numbness of the face, or the face or eyelid drooping to one side. ? A = Arm weakness or numbness. This happens suddenly and usually on one side of the body. ? S = Speech problems. This includes trouble speaking or  trouble understanding. ? T = Time. Time to call 911 or seek emergency care. Do not wait to see if symptoms go away. Make note of the time your symptoms started.  Other signs of stroke may include: ? A sudden, severe headache with no known cause. ? Feeling sick to your stomach (nauseous) or throwing up (vomiting). ? Seizure. Call your local emergency services (911 in [place] Do notdrive yourself to the clinic or hospital. Summary  The carotid arteries are arteries on both sides of the neck.  If these arteries get smaller or get blocked, you are more likely to have a stroke or a warning stroke (transient ischemic attack).  Take over-the-counter and prescription medicines only as told by your doctor.  Keep all follow-up visits as told by your doctor. This is important. This information is not intended to replace advice given to you by your health care provider. Make sure you discuss any questions you have with your health care provider. Document Released: 11/01/2012 Document Revised: 11/10/2017 Document Reviewed: 11/10/2017 Elsevier Patient Education  2020 [place] may shower as of tomorrow. Keep your groins clean and dry. No driving until cleared at your first post-op visit.

## 2019-07-13 NOTE — Discharge Summary (Signed)
[facility]    Discharge Summary  Patient ID:  JIRAIYA MCEWAN MRN: [id number] DOB/AGE: 1927/08/28 85 y.o.  Admit date: 07/12/2019 Discharge date: 07/13/2019 Date of Surgery: 07/12/2019 Surgeon: Surgeon(s): Algernon Huxley, MD  Admission Diagnosis: Carotid stenosis, symptomatic w/o infarct, right [I65.21]  Discharge Diagnoses:  Carotid stenosis, symptomatic w/o infarct, right [I65.21]  Secondary Diagnoses: Past Medical History:  Diagnosis Date  . Afib ([place])   . Anemia   . Arthritis   . Back pain   . CHF (congestive heart failure) ([place])   . COPD (chronic obstructive pulmonary disease) ([place])   . Coronary artery disease   . Deafness   . Depression   . Diabetes mellitus without complication ([place])   . Dizziness    when gets up  . GERD (gastroesophageal reflux disease)   . Hyperlipidemia   . Hypertension   . Melena   . Myocardial infarction ([place])    maybe a small one  . Obesity   . Oxygen decrease    WEARS HS,PRN  . Peripheral neuropathy   . Peripheral neuropathy    bil.hands and feet  . Skin cancer   . Umbilical hernia    Procedure(s): CAROTID PTA/STENT INTERVENTION (RIGHT)  Discharged Condition: Good  HPI / Hospital Course:  Justin Leonard is a 85 y.o. male with multiple medical issues (see above) The patient presented with reports of dizziness and lightheadedness particularly with standing over the past year or so.  Nothing seems to really make that better but rising too quickly makes it worse.  A few weeks ago, he had an episode where he lost vision in his eyes and was unable to see for several minutes.  This prompted a work-up by his cardiologist.  He had a previous history of carotid disease and a recent carotid duplex was performed demonstrating greater than 70% right ICA stenosis and 50 to 70% left ICA stenosis. On 07/13/19, the patient underwent:  1.  Ultrasound guidance for vascular access right femoral artery 2.  Placement of a 9 mm  proximal 7 mm distal 4 cm Exact stent with the use of the NAV-6 embolic protection device in the right carotid artery  The patient tolerated the procedure well was transferred from the recovery room to the ICU for observation overnight.  The patient's night of surgery was unremarkable.  Postop day #1/day of discharge the patient was tolerating a regular diet, urinating without issue, ambulating at his baseline and his discomfort was controlled with use of p.o. pain medication.  The patient was afebrile with stable vital signs.  Physical exam:  A&Ox3, NAD Face: Symmetrical, tongue midline  Trachea: Midline Neck: No ecchymosis or swelling noted CV: Regular rate and rhythm Pulmonary: Clear to auscultation bilaterally Abdomen: Soft, nontender, nondistended, positive bowel sounds Right groin: Clean and dry.  No evidence of swelling or drainage. Vascular: Extremities are warm distally to the toes.  Minimal edema Neuro: Upper/lower 5/5, right/left 5/5, motor/sensory intact  Complications: None  Consults: None  Significant Diagnostic Studies: CBC Lab Results  Component Value Date   WBC 6.6 07/13/2019   HGB 9.8 (L) 07/13/2019   HCT 29.8 (L) 07/13/2019   MCV 95.5 07/13/2019   PLT 152 07/13/2019   BMET    Component Value Date/Time   NA 139 07/13/2019 0448   NA 139 06/24/2014 1501   K 3.9 07/13/2019 0448   K 4.6 06/24/2014 1501   CL 111 07/13/2019 0448   CL 106 06/24/2014 1501  CO2 24 07/13/2019 0448   CO2 26 06/24/2014 1501   GLUCOSE 186 (H) 07/13/2019 0448   GLUCOSE 118 (H) 06/24/2014 1501   BUN 18 07/13/2019 0448   BUN 22 (H) 06/24/2014 1501   CREATININE 0.98 07/13/2019 0448   CREATININE 1.45 (H) 06/24/2014 1501   CALCIUM 9.1 07/13/2019 0448   CALCIUM 9.1 06/24/2014 1501   GFRNONAA >60 07/13/2019 0448   GFRNONAA 44 (L) 06/24/2014 1501   GFRAA >60 07/13/2019 0448   GFRAA 51 (L) 06/24/2014 1501   COAG Lab Results  Component Value Date   INR 0.97 01/08/2019   INR 0.97  10/25/2016   INR 1.16 09/21/2016   Disposition:  Discharge to :Home  Allergies as of 07/13/2019   No Known Allergies     Medication List    TAKE these medications   acetaminophen 650 MG CR tablet Commonly known as: TYLENOL Take 650 mg by mouth 2 (two) times daily as needed for pain.   clopidogrel 75 MG tablet Commonly known as: PLAVIX Take 1 tablet (75 mg total) by mouth daily.   ferrous sulfate 325 (65 FE) MG tablet Take 325 mg by mouth 2 (two) times daily.   FLUoxetine 40 MG capsule Commonly known as: PROZAC Take 40 mg by mouth daily.   hydrALAZINE 50 MG tablet Commonly known as: APRESOLINE Take 50 mg by mouth 2 (two) times daily.   lisinopril 2.5 MG tablet Commonly known as: ZESTRIL Take 2.5 mg by mouth daily. On hold   lovastatin 20 MG tablet Commonly known as: MEVACOR Take 20 mg by mouth at bedtime.   metFORMIN 500 MG tablet Commonly known as: GLUCOPHAGE Take 500 mg by mouth 2 (two) times daily.   metoprolol succinate 25 MG 24 hr tablet Commonly known as: TOPROL-XL Take 12.5 mg by mouth daily.   MIRALAX PO Take 17 g by mouth daily.   nitroGLYCERIN 0.4 MG SL tablet Commonly known as: NITROSTAT Place 0.4 mg under the tongue every 5 (five) minutes as needed for chest pain.   OXYGEN Place 2 L into the nose Nightly.   pantoprazole 40 MG tablet Commonly known as: PROTONIX Take 40 mg by mouth daily.   ranolazine 500 MG 12 hr tablet Commonly known as: RANEXA Take 500 mg by mouth 2 (two) times daily.   Spiriva Respimat 2.5 MCG/ACT Aers Generic drug: Tiotropium Bromide Monohydrate Inhale 2 puffs into the lungs See admin instructions. Inhale 2 puffs 1 to 2 times a day.   tamsulosin 0.4 MG Caps capsule Commonly known as: FLOMAX Take 1 capsule (0.4 mg total) by mouth daily after supper.   Ventolin HFA 108 (90 Base) MCG/ACT inhaler Generic drug: albuterol Inhale 1-2 puffs into the lungs as needed for wheezing or shortness of breath. Reported on  12/16/2015      Verbal and written Discharge instructions given to the patient. Wound care per Discharge AVS Follow-up Information    Kris Hartmann, NP Follow up in 1 week(s).   Specialty: Vascular Surgery Why: Carotid Stent. See Arna Medici. First post-op check. Contact information: [place] [address] [phone]          Signed: Sela Hua, PA-C  07/13/2019, 10:41 AM

## 2019-07-20 ENCOUNTER — Other Ambulatory Visit: Payer: Self-pay

## 2019-07-20 ENCOUNTER — Encounter (INDEPENDENT_AMBULATORY_CARE_PROVIDER_SITE_OTHER): Payer: Self-pay | Admitting: Nurse Practitioner

## 2019-07-20 ENCOUNTER — Ambulatory Visit (INDEPENDENT_AMBULATORY_CARE_PROVIDER_SITE_OTHER): Payer: Medicare Other | Admitting: Nurse Practitioner

## 2019-07-20 VITALS — BP 132/57 | HR 81 | Resp 12 | Ht 74.0 in | Wt 219.0 lb

## 2019-07-20 DIAGNOSIS — K219 Gastro-esophageal reflux disease without esophagitis: Secondary | ICD-10-CM

## 2019-07-20 DIAGNOSIS — I6521 Occlusion and stenosis of right carotid artery: Secondary | ICD-10-CM

## 2019-07-20 DIAGNOSIS — J410 Simple chronic bronchitis: Secondary | ICD-10-CM

## 2019-07-20 NOTE — Progress Notes (Signed)
SUBJECTIVE:  Patient ID: Justin Leonard, male    DOB: [birth year], 85 y.o.   MRN: [id number] Chief Complaint  Patient presents with  . Follow-up    HPI  Justin Leonard is a 85 y.o. male that presents today for an incision check after carotid stenting.  The incision is very nearly well-healed.  The patient denies any issues with unilateral numbness or weakness.  He endorses having a slight headache which is gotten better every day since the surgery.  The patient continues to have some bruising at the groin site however there is no indication that hematoma may be present.  He denies any fever, chills, nausea, vomiting or diarrhea.  Past Medical History:  Diagnosis Date  . Afib ([place])   . Anemia   . Arthritis   . Back pain   . CHF (congestive heart failure) ([place])   . COPD (chronic obstructive pulmonary disease) ([place])   . Coronary artery disease   . Deafness   . Depression   . Diabetes mellitus without complication ([place])   . Dizziness    when gets up  . GERD (gastroesophageal reflux disease)   . Hyperlipidemia   . Hypertension   . Melena   . Myocardial infarction ([place])    maybe a small one  . Obesity   . Oxygen decrease    WEARS HS,PRN  . Peripheral neuropathy   . Peripheral neuropathy    bil.hands and feet  . Skin cancer   . Umbilical hernia     Past Surgical History:  Procedure Laterality Date  . CARDIAC CATHETERIZATION Left 07/17/2015   Procedure: Right/Left Heart Cath and Coronary Angiography;  Surgeon: [name], MD;  Location: [facility];  Service: Cardiovascular;  Laterality: Left;  . CARDIAC CATHETERIZATION N/A 07/17/2015   Procedure: Coronary Stent Intervention;  Surgeon: [name], MD;  Location: [place] [facility];  Service: Cardiovascular;  Laterality: N/A;  . CARDIAC CATHETERIZATION Right 05/10/2016   Procedure: Left Heart Cath and Coronary Angiography;  Surgeon: [name], MD;  Location: [place] [facility];  Service:  Cardiovascular;  Laterality: Right;  . CAROTID PTA/STENT INTERVENTION Right 07/12/2019   Procedure: CAROTID PTA/STENT INTERVENTION;  Surgeon: Algernon Huxley, MD;  Location: [place] [facility];  Service: Cardiovascular;  Laterality: Right;  . CHOLECYSTECTOMY N/A 11/04/2016   Procedure: LAPAROSCOPIC CHOLECYSTECTOMY, POSSIBLE OPEN umbilical hernia repair;  Surgeon: [name], MD;  Location: [facility];  Service: General;  Laterality: N/A;  . COLONOSCOPY WITH PROPOFOL N/A 12/12/2015   Procedure: COLONOSCOPY WITH PROPOFOL;  Surgeon: [name], MD;  Location: [facility]OS[facility];  Service: Gastroenterology;  Laterality: N/A;  . CORONARY ANGIOPLASTY     stent 8/16  . CORONARY ARTERY BYPASS GRAFT  1994  . ESOPHAGOGASTRODUODENOSCOPY N/A 11/10/2015   Procedure: ESOPHAGOGASTRODUODENOSCOPY (EGD);  Surgeon: [name], MD;  Location: [facility];  Service: Endoscopy;  Laterality: N/A;  . EYE SURGERY    . gall bladder drain    . MOHS SURGERY      Social History   Socioeconomic History  . Marital status: Widowed    Spouse name: Not on file  . Number of children: Not on file  . Years of education: Not on file  . Highest education level: Not on file  Occupational History  . Not on file  Social Needs  . Financial resource strain: Not on file  . Food insecurity    Worry: Not on file  Inability: Not on file  . Transportation needs    Medical: Not on file    Non-medical: Not on file  Tobacco Use  . Smoking status: Former Smoker    Packs/day: 3.00    Years: 30.00    Pack years: 90.00    Types: Cigarettes    Quit date: 04/16/1978    Years since quitting: 41.2  . Smokeless tobacco: Never Used  . Tobacco comment: Quit smoking 1979  Substance and Sexual Activity  . Alcohol use: No    Alcohol/week: 0.0 standard drinks  . Drug use: No  . Sexual activity: Not Currently  Lifestyle  . Physical activity    Days per week: Not on file    Minutes per session: Not on file  . Stress: Not on file   Relationships  . Social Herbalist on phone: Not on file    Gets together: Not on file    Attends religious service: Not on file    Active member of club or organization: Not on file    Attends meetings of clubs or organizations: Not on file    Relationship status: Not on file  . Intimate partner violence    Fear of current or ex partner: Not on file    Emotionally abused: Not on file    Physically abused: Not on file    Forced sexual activity: Not on file  Other Topics Concern  . Not on file  Social History Narrative  . Not on file    Family History  Problem Relation Age of Onset  . Hypertension Father   . CVA Father   . Anemia Neg Hx   . Arrhythmia Neg Hx   . Asthma Neg Hx   . Clotting disorder Neg Hx   . Fainting Neg Hx   . Heart attack Neg Hx   . Heart disease Neg Hx   . Heart failure Neg Hx   . Hyperlipidemia Neg Hx   . Prostate cancer Neg Hx   . Bladder Cancer Neg Hx   . Kidney cancer Neg Hx     No Known Allergies   Review of Systems   Review of Systems: Negative Unless Checked Constitutional: [] Weight loss  [] Fever  [] Chills Cardiac: [] Chest pain   []  Atrial Fibrillation  [] Palpitations   [] Shortness of breath when laying flat   [] Shortness of breath with exertion. [] Shortness of breath at rest Vascular:  [] Pain in legs with walking   [] Pain in legs with standing [] Pain in legs when laying flat   [] Claudication    [] Pain in feet when laying flat    [] History of DVT   [] Phlebitis   [] Swelling in legs   [] Varicose veins   [] Non-healing ulcers Pulmonary:   [] Uses home oxygen   [] Productive cough   [] Hemoptysis   [] Wheeze  [] COPD   [] Asthma Neurologic:  [] Dizziness   [] Seizures  [] Blackouts [] History of stroke   [] History of TIA  [] Aphasia   [] Temporary Blindness   [] Weakness or numbness in arm   [] Weakness or numbness in leg Musculoskeletal:   [] Joint swelling   [] Joint pain   [] Low back pain  []  History of Knee Replacement [] Arthritis [] back Surgeries   []  Spinal Stenosis    Hematologic:  [] Easy bruising  [] Easy bleeding   [] Hypercoagulable state   [] Anemic Gastrointestinal:  [] Diarrhea   [] Vomiting  [] Gastroesophageal reflux/heartburn   [] Difficulty swallowing. [] Abdominal pain Genitourinary:  [] Chronic kidney disease   [] Difficult urination  [] Anuric   []   Blood in urine [] Frequent urination  [] Burning with urination   [] Hematuria Skin:  [] Rashes   [] Ulcers [] Wounds Psychological:  [] History of anxiety   []  History of major depression  []  Memory Difficulties      OBJECTIVE:   Physical Exam  BP (!) 132/57 (BP Location: Left Arm, Patient Position: Sitting, Cuff Size: Normal)   Pulse 81   Resp 12   Ht 6\' 2"  (1.88 m)   Wt 219 lb (99.3 kg)   BMI 28.12 kg/m   Gen: WD/WN, NAD Head: [place]/AT, No temporalis wasting.  Ear/Nose/Throat: Hearing grossly intact, nares w/o erythema or drainage Eyes: PER, EOMI, sclera nonicteric.  Neck: Supple, no masses.  No JVD.  Pulmonary:  Good air movement, no use of accessory muscles.  Cardiac: RRR Vascular:  Clean dry and intact incision site from carotid stent placement in right groin.  Significant bruising however in the healing phases.  Tissues are soft. Vessel Right Left  Radial Palpable Palpable   Gastrointestinal: soft, non-distended. No guarding/no peritoneal signs.  Musculoskeletal: M/S 5/5 throughout.  No deformity or atrophy.  Neurologic: Pain and light touch intact in extremities.  Symmetrical.  Speech is fluent. Motor exam as listed above. Psychiatric: Judgment intact, Mood & affect appropriate for pt's clinical situation. Dermatologic: No Venous rashes. No Ulcers Noted.  No changes consistent with cellulitis. Lymph : No Cervical lymphadenopathy, no lichenification or skin changes of chronic lymphedema.       ASSESSMENT AND PLAN:  1. Carotid stenosis, symptomatic w/o infarct, right The patient's incision site looks well.  He has quite a bit of bruising but it is all soft at this time and  showing some healing phases.  We will have the patient follow-up in 2 to 3 months for a carotid duplex to assess his stent patency.  2. Simple chronic bronchitis ([place]) Continue pulmonary medications and aerosols as already ordered, these medications have been reviewed and there are no changes at this time.    3. Gastroesophageal reflux disease, esophagitis presence not specified Continue PPI as already ordered, this medication has been reviewed and there are no changes at this time.  Avoidence of caffeine and alcohol  Moderate elevation of the head of the bed    Current Outpatient Medications on File Prior to Visit  Medication Sig Dispense Refill  . acetaminophen (TYLENOL) 650 MG CR tablet Take 650 mg by mouth 2 (two) times daily as needed for pain.     Marland Kitchen clopidogrel (PLAVIX) 75 MG tablet Take 1 tablet (75 mg total) by mouth daily. 90 tablet 3  . ferrous sulfate 325 (65 FE) MG tablet Take 325 mg by mouth 2 (two) times daily.     Marland Kitchen FLUoxetine (PROZAC) 40 MG capsule Take 40 mg by mouth daily.     . hydrALAZINE (APRESOLINE) 50 MG tablet Take 50 mg by mouth 2 (two) times daily.     Marland Kitchen lovastatin (MEVACOR) 20 MG tablet Take 20 mg by mouth at bedtime.    . metFORMIN (GLUCOPHAGE) 500 MG tablet Take 500 mg by mouth 2 (two) times daily.     . metoprolol succinate (TOPROL-XL) 25 MG 24 hr tablet Take 12.5 mg by mouth daily.     . nitroGLYCERIN (NITROSTAT) 0.4 MG SL tablet Place 0.4 mg under the tongue every 5 (five) minutes as needed for chest pain.    . OXYGEN Place 2 L into the nose Nightly.    . pantoprazole (PROTONIX) 40 MG tablet Take 40 mg by mouth daily.     Marland Kitchen  Polyethylene Glycol 3350 (MIRALAX PO) Take 17 g by mouth daily.    . ranolazine (RANEXA) 500 MG 12 hr tablet Take 500 mg by mouth 2 (two) times daily.     . tamsulosin (FLOMAX) 0.4 MG CAPS capsule Take 1 capsule (0.4 mg total) by mouth daily after supper. 90 capsule 3  . Tiotropium Bromide Monohydrate (SPIRIVA RESPIMAT) 2.5 MCG/ACT  AERS Inhale 2 puffs into the lungs See admin instructions. Inhale 2 puffs 1 to 2 times a day.    . VENTOLIN HFA 108 (90 BASE) MCG/ACT inhaler Inhale 1-2 puffs into the lungs as needed for wheezing or shortness of breath. Reported on 12/16/2015     No current facility-administered medications on file prior to visit.     There are no Patient Instructions on file for this visit. No follow-ups on file.   Kris Hartmann, NP  This note was completed with Sales executive.  Any errors are purely unintentional.

## 2019-08-10 ENCOUNTER — Other Ambulatory Visit: Payer: Self-pay | Admitting: Urology

## 2019-08-10 DIAGNOSIS — N401 Enlarged prostate with lower urinary tract symptoms: Secondary | ICD-10-CM

## 2019-08-13 ENCOUNTER — Telehealth: Payer: Self-pay | Admitting: Urology

## 2019-08-13 DIAGNOSIS — N401 Enlarged prostate with lower urinary tract symptoms: Secondary | ICD-10-CM

## 2019-08-13 NOTE — Telephone Encounter (Signed)
Pt's son called and pt needs a refill for Tamsulosin.  He also scheduled a virtual visit for this Wednesday.

## 2019-08-14 MED ORDER — TAMSULOSIN HCL 0.4 MG PO CAPS: 0.4000 mg | ORAL_CAPSULE | Freq: Every day | ORAL | 0 refills | Status: DC

## 2019-08-14 NOTE — Telephone Encounter (Signed)
30day temp script sent

## 2019-08-14 NOTE — Progress Notes (Deleted)
8:35 AM   Justin Leonard [birth year] [id number]  Referring provider: [name], [place],  [place] [address]  No chief complaint on file.   HPI: 85yo M with a history of urinary retention and BPH with LU TS who presents for a one year follow up.  BPH WITH LUTS  (prostate and/or bladder) IPSS score: *** PVR: ***   Previous score: 17/2   Previous PVR: ***   Major complaint(s):  x *** years. Denies any dysuria, hematuria or suprapubic pain.   Currently taking: tamsulosin 0.4 mg daily   His has had ***.   Denies any recent fevers, chills, nausea or vomiting.      Score:  1-7 Mild 8-19 Moderate 20-35 Severe   PMH: Past Medical History:  Diagnosis Date  . Afib ([place])   . Anemia   . Arthritis   . Back pain   . CHF (congestive heart failure) ([place])   . COPD (chronic obstructive pulmonary disease) ([place])   . Coronary artery disease   . Deafness   . Depression   . Diabetes mellitus without complication ([place])   . Dizziness    when gets up  . GERD (gastroesophageal reflux disease)   . Hyperlipidemia   . Hypertension   . Melena   . Myocardial infarction ([place])    maybe a small one  . Obesity   . Oxygen decrease    WEARS HS,PRN  . Peripheral neuropathy   . Peripheral neuropathy    bil.hands and feet  . Skin cancer   . Umbilical hernia     Surgical History: Past Surgical History:  Procedure Laterality Date  . CARDIAC CATHETERIZATION Left 07/17/2015   Procedure: Right/Left Heart Cath and Coronary Angiography;  Surgeon: [name], MD;  Location: [place] [facility];  Service: Cardiovascular;  Laterality: Left;  . CARDIAC CATHETERIZATION N/A 07/17/2015   Procedure: Coronary Stent Intervention;  Surgeon: [name], MD;  Location: [place] [facility];  Service: Cardiovascular;  Laterality: N/A;  . CARDIAC CATHETERIZATION Right 05/10/2016   Procedure: Left Heart Cath and Coronary Angiography;  Surgeon: [name], MD;   Location: [place] [facility];  Service: Cardiovascular;  Laterality: Right;  . CAROTID PTA/STENT INTERVENTION Right 07/12/2019   Procedure: CAROTID PTA/STENT INTERVENTION;  Surgeon: Algernon Huxley, MD;  Location: [place] [facility];  Service: Cardiovascular;  Laterality: Right;  . CHOLECYSTECTOMY N/A 11/04/2016   Procedure: LAPAROSCOPIC CHOLECYSTECTOMY, POSSIBLE OPEN umbilical hernia repair;  Surgeon: [name], MD;  Location: [facility];  Service: General;  Laterality: N/A;  . COLONOSCOPY WITH PROPOFOL N/A 12/12/2015   Procedure: COLONOSCOPY WITH PROPOFOL;  Surgeon: [name], MD;  Location: [facility] ENDOSCOPY;  Service: Gastroenterology;  Laterality: N/A;  . CORONARY ANGIOPLASTY     stent 8/16  . CORONARY ARTERY BYPASS GRAFT  1994  . ESOPHAGOGASTRODUODENOSCOPY N/A 11/10/2015   Procedure: ESOPHAGOGASTRODUODENOSCOPY (EGD);  Surgeon: [name], MD;  Location: [facility] ENDOSCOPY;  Service: Endoscopy;  Laterality: N/A;  . EYE SURGERY    . gall bladder drain    . MOHS SURGERY      Home Medications:  Allergies as of 08/15/2019   No Known Allergies     Medication List       Accurate as of August 14, 2019  8:35 AM. If you have any questions, ask your nurse or doctor.        acetaminophen 650 MG CR tablet Commonly known as: TYLENOL Take 650  mg by mouth 2 (two) times daily as needed for pain.   clopidogrel 75 MG tablet Commonly known as: PLAVIX Take 1 tablet (75 mg total) by mouth daily.   ferrous sulfate 325 (65 FE) MG tablet Take 325 mg by mouth 2 (two) times daily.   FLUoxetine 40 MG capsule Commonly known as: PROZAC Take 40 mg by mouth daily.   hydrALAZINE 50 MG tablet Commonly known as: APRESOLINE Take 50 mg by mouth 2 (two) times daily.   lovastatin 20 MG tablet Commonly known as: MEVACOR Take 20 mg by mouth at bedtime.   metFORMIN 500 MG tablet Commonly known as: GLUCOPHAGE Take 500 mg by mouth 2 (two) times daily.   metoprolol succinate 25 MG 24 hr tablet Commonly  known as: TOPROL-XL Take 12.5 mg by mouth daily.   MIRALAX PO Take 17 g by mouth daily.   nitroGLYCERIN 0.4 MG SL tablet Commonly known as: NITROSTAT Place 0.4 mg under the tongue every 5 (five) minutes as needed for chest pain.   OXYGEN Place 2 L into the nose Nightly.   pantoprazole 40 MG tablet Commonly known as: PROTONIX Take 40 mg by mouth daily.   ranolazine 500 MG 12 hr tablet Commonly known as: RANEXA Take 500 mg by mouth 2 (two) times daily.   Spiriva Respimat 2.5 MCG/ACT Aers Generic drug: Tiotropium Bromide Monohydrate Inhale 2 puffs into the lungs See admin instructions. Inhale 2 puffs 1 to 2 times a day.   tamsulosin 0.4 MG Caps capsule Commonly known as: FLOMAX Take 1 capsule (0.4 mg total) by mouth daily after supper.   Ventolin HFA 108 (90 Base) MCG/ACT inhaler Generic drug: albuterol Inhale 1-2 puffs into the lungs as needed for wheezing or shortness of breath. Reported on 12/16/2015       Allergies: No Known Allergies  Family History: Family History  Problem Relation Age of Onset  . Hypertension Father   . CVA Father   . Anemia Neg Hx   . Arrhythmia Neg Hx   . Asthma Neg Hx   . Clotting disorder Neg Hx   . Fainting Neg Hx   . Heart attack Neg Hx   . Heart disease Neg Hx   . Heart failure Neg Hx   . Hyperlipidemia Neg Hx   . Prostate cancer Neg Hx   . Bladder Cancer Neg Hx   . Kidney cancer Neg Hx     Social History:  reports that he quit smoking about 41 years ago. His smoking use included cigarettes. He has a 90.00 pack-year smoking history. He has never used smokeless tobacco. He reports that he does not drink alcohol or use drugs.  ROS:                                        Physical Exam: There were no vitals taken for this visit.  Constitutional:  Well nourished. Alert and oriented, No acute distress. HEENT: [place] AT, moist mucus membranes.  Trachea midline, no masses. Cardiovascular: No clubbing, cyanosis,  or edema. Respiratory: Normal respiratory effort, no increased work of breathing. GI: Abdomen is soft, non tender, non distended, no abdominal masses. Liver and spleen not palpable.  No hernias appreciated.  Stool sample for occult testing is not indicated.   GU: No CVA tenderness.  No bladder fullness or masses.  Patient with circumcised/uncircumcised phallus. ***Foreskin easily retracted***  Urethral meatus is patent.  No penile discharge. No penile lesions or rashes. Scrotum without lesions, cysts, rashes and/or edema.  Testicles are located scrotally bilaterally. No masses are appreciated in the testicles. Left and right epididymis are normal. Rectal: Patient with  normal sphincter tone. Anus and perineum without scarring or rashes. No rectal masses are appreciated. Prostate is approximately *** grams, *** nodules are appreciated. Seminal vesicles are normal. Skin: No rashes, bruises or suspicious lesions. Lymph: No cervical or inguinal adenopathy. Neurologic: Grossly intact, no focal deficits, moving all 4 extremities. Psychiatric: Normal mood and affect.   Laboratory Data: Results for orders placed or performed during the hospital encounter of 07/12/19  MRSA PCR Screening   Specimen: Nasopharyngeal  Result Value Ref Range   MRSA by PCR NEGATIVE NEGATIVE  Glucose, capillary  Result Value Ref Range   Glucose-Capillary 140 (H) 70 - 99 mg/dL  CBC  Result Value Ref Range   WBC 6.6 4.0 - 10.5 K/uL   RBC 3.12 (L) 4.22 - 5.81 MIL/uL   Hemoglobin 9.8 (L) 13.0 - 17.0 g/dL   HCT 29.8 (L) 39.0 - 52.0 %   MCV 95.5 80.0 - 100.0 fL   MCH 31.4 26.0 - 34.0 pg   MCHC 32.9 30.0 - 36.0 g/dL   RDW 13.1 11.5 - 15.5 %   Platelets 152 150 - 400 K/uL   nRBC 0.0 0.0 - 0.2 %  Basic metabolic panel  Result Value Ref Range   Sodium 139 135 - 145 mmol/L   Potassium 3.9 3.5 - 5.1 mmol/L   Chloride 111 98 - 111 mmol/L   CO2 24 22 - 32 mmol/L   Glucose, Bld 186 (H) 70 - 99 mg/dL   BUN 18 8 - 23 mg/dL    Creatinine, Ser 0.98 0.61 - 1.24 mg/dL   Calcium 9.1 8.9 - 10.3 mg/dL   GFR calc non Af Amer >60 >60 mL/min   GFR calc Af Amer >60 >60 mL/min   Anion gap 4 (L) 5 - 15  Magnesium  Result Value Ref Range   Magnesium 2.0 1.7 - 2.4 mg/dL  Glucose, capillary  Result Value Ref Range   Glucose-Capillary 131 (H) 70 - 99 mg/dL  POCT Activated clotting time  Result Value Ref Range   Activated Clotting Time 186 seconds   I have reviewed labs.     Assessment & Plan:   1. History of urinary retention-  Patient reports that he has been voiding well.    2. BPH with LUTS  - IPSS score is 3/1, it is improving  - Continue tamsulosin 0.4 mg daily; refills given  - RTC in 12 months for IPSS and exam   No follow-ups on file.  These notes generated with voice recognition software. I apologize for typographical errors.  Zara Council, PA-C  [facility] [address] [place], [place] [address] [phone]

## 2019-08-15 ENCOUNTER — Other Ambulatory Visit: Payer: Self-pay

## 2019-08-15 ENCOUNTER — Telehealth (INDEPENDENT_AMBULATORY_CARE_PROVIDER_SITE_OTHER): Payer: Medicare Other | Admitting: Urology

## 2019-08-15 DIAGNOSIS — N138 Other obstructive and reflux uropathy: Secondary | ICD-10-CM | POA: Diagnosis not present

## 2019-08-15 DIAGNOSIS — N401 Enlarged prostate with lower urinary tract symptoms: Secondary | ICD-10-CM

## 2019-08-15 DIAGNOSIS — Z87898 Personal history of other specified conditions: Secondary | ICD-10-CM

## 2019-08-15 MED ORDER — TAMSULOSIN HCL 0.4 MG PO CAPS: 0.4000 mg | ORAL_CAPSULE | Freq: Every day | ORAL | 3 refills | Status: DC

## 2019-08-21 NOTE — Progress Notes (Signed)
Virtual Visit via Telephone Note  I connected with Justin Leonard on 08/21/19 at  1:00 PM EDT by telephone and verified that I am speaking with the correct person using two identifiers.  Location: Patient: Home Provider: Office    I discussed the limitations, risks, security and privacy concerns of performing an evaluation and management service by telephone and the availability of in person appointments. I also discussed with the patient that there may be a patient responsible charge related to this service. The patient expressed understanding and agreed to proceed.   History of Present Illness: Justin Leonard is a 85 year old male with a history of urinary retention and BPH with LU TS who is contacted via telephone for his yearly visit.    He states he is having no issues with urination.  He feels he is emptying his bladder completely.  Patient denies any gross hematuria, dysuria or suprapubic/flank pain.  Patient denies any fevers, chills, nausea or vomiting.   He is needing his Flomax refilled.     Observations/Objective: Justin Leonard is hard of hearing, but he answers questions appropriately and does not sound distressed.   Assessment and Plan:  1. History of urinary retention No instances of retention  2. BPH with LU TS Flomax refilled  Follow Up Instructions:  Justin Leonard will contact us for any urinary issues.  I discussed the assessment and treatment plan with the patient. The patient was provided an opportunity to ask questions and all were answered. The patient agreed with the plan and demonstrated an understanding of the instructions.   The patient was advised to call back or seek an in-person evaluation if the symptoms worsen or if the condition fails to improve as anticipated.  I provided 10 minutes of non-face-to-face time during this encounter.   Justin Harvel, PA-C

## 2019-10-02 ENCOUNTER — Encounter (INDEPENDENT_AMBULATORY_CARE_PROVIDER_SITE_OTHER): Payer: Medicare Other

## 2019-10-02 ENCOUNTER — Ambulatory Visit (INDEPENDENT_AMBULATORY_CARE_PROVIDER_SITE_OTHER): Payer: Medicare Other | Admitting: Vascular Surgery

## 2019-10-11 ENCOUNTER — Ambulatory Visit: Payer: Medicare Other | Admitting: Family

## 2019-10-23 ENCOUNTER — Encounter (INDEPENDENT_AMBULATORY_CARE_PROVIDER_SITE_OTHER): Payer: Self-pay | Admitting: Vascular Surgery

## 2019-10-23 ENCOUNTER — Ambulatory Visit (INDEPENDENT_AMBULATORY_CARE_PROVIDER_SITE_OTHER): Payer: Medicare Other | Admitting: Vascular Surgery

## 2019-10-23 ENCOUNTER — Other Ambulatory Visit: Payer: Self-pay

## 2019-10-23 ENCOUNTER — Ambulatory Visit (INDEPENDENT_AMBULATORY_CARE_PROVIDER_SITE_OTHER): Payer: Medicare Other

## 2019-10-23 DIAGNOSIS — I6521 Occlusion and stenosis of right carotid artery: Secondary | ICD-10-CM

## 2019-11-06 ENCOUNTER — Other Ambulatory Visit: Payer: Self-pay

## 2019-11-06 ENCOUNTER — Emergency Department: Payer: Medicare Other

## 2019-11-06 ENCOUNTER — Encounter: Payer: Self-pay | Admitting: Emergency Medicine

## 2019-11-06 ENCOUNTER — Emergency Department
Admission: EM | Admit: 2019-11-06 | Discharge: 2019-11-06 | Disposition: A | Discharge status: Home / Self Care | Payer: Medicare Other | Attending: Emergency Medicine | Admitting: Emergency Medicine

## 2019-11-06 DIAGNOSIS — I5032 Chronic diastolic (congestive) heart failure: Secondary | ICD-10-CM | POA: Insufficient documentation

## 2019-11-06 DIAGNOSIS — Z87891 Personal history of nicotine dependence: Secondary | ICD-10-CM | POA: Diagnosis not present

## 2019-11-06 DIAGNOSIS — E119 Type 2 diabetes mellitus without complications: Secondary | ICD-10-CM | POA: Insufficient documentation

## 2019-11-06 DIAGNOSIS — Z951 Presence of aortocoronary bypass graft: Secondary | ICD-10-CM | POA: Insufficient documentation

## 2019-11-06 DIAGNOSIS — I110 Hypertensive heart disease with heart failure: Secondary | ICD-10-CM | POA: Diagnosis not present

## 2019-11-06 DIAGNOSIS — M25461 Effusion, right knee: Secondary | ICD-10-CM

## 2019-11-06 DIAGNOSIS — G8929 Other chronic pain: Secondary | ICD-10-CM | POA: Insufficient documentation

## 2019-11-06 DIAGNOSIS — Z79899 Other long term (current) drug therapy: Secondary | ICD-10-CM | POA: Diagnosis not present

## 2019-11-06 DIAGNOSIS — M1711 Unilateral primary osteoarthritis, right knee: Secondary | ICD-10-CM | POA: Diagnosis not present

## 2019-11-06 DIAGNOSIS — Z85828 Personal history of other malignant neoplasm of skin: Secondary | ICD-10-CM | POA: Insufficient documentation

## 2019-11-06 DIAGNOSIS — J449 Chronic obstructive pulmonary disease, unspecified: Secondary | ICD-10-CM | POA: Diagnosis not present

## 2019-11-06 DIAGNOSIS — M25561 Pain in right knee: Secondary | ICD-10-CM | POA: Diagnosis present

## 2019-11-06 LAB — CBC WITH DIFFERENTIAL/PLATELET
Abs Immature Granulocytes: 0.05 10*3/uL (ref 0.00–0.07)
Basophils Absolute: 0 10*3/uL (ref 0.0–0.1)
Basophils Relative: 0 %
Eosinophils Absolute: 0.1 10*3/uL (ref 0.0–0.5)
Eosinophils Relative: 1 %
HCT: 30.3 % — ABNORMAL LOW (ref 39.0–52.0)
Hemoglobin: 10.1 g/dL — ABNORMAL LOW (ref 13.0–17.0)
Immature Granulocytes: 1 %
Lymphocytes Relative: 17 %
Lymphs Abs: 1.6 10*3/uL (ref 0.7–4.0)
MCH: 31.3 pg (ref 26.0–34.0)
MCHC: 33.3 g/dL (ref 30.0–36.0)
MCV: 93.8 fL (ref 80.0–100.0)
Monocytes Absolute: 1.1 10*3/uL — ABNORMAL HIGH (ref 0.1–1.0)
Monocytes Relative: 12 %
Neutro Abs: 6.7 10*3/uL (ref 1.7–7.7)
Neutrophils Relative %: 69 %
Platelets: 169 10*3/uL (ref 150–400)
RBC: 3.23 MIL/uL — ABNORMAL LOW (ref 4.22–5.81)
RDW: 12.8 % (ref 11.5–15.5)
WBC: 9.6 10*3/uL (ref 4.0–10.5)
nRBC: 0 % (ref 0.0–0.2)

## 2019-11-06 LAB — BASIC METABOLIC PANEL
Anion gap: 9 (ref 5–15)
BUN: 14 mg/dL (ref 8–23)
CO2: 23 mmol/L (ref 22–32)
Calcium: 9.5 mg/dL (ref 8.9–10.3)
Chloride: 102 mmol/L (ref 98–111)
Creatinine, Ser: 1.09 mg/dL (ref 0.61–1.24)
GFR calc Af Amer: >60 mL/min (ref >60)
GFR calc non Af Amer: 59 mL/min — ABNORMAL LOW (ref >60)
Glucose, Bld: 145 mg/dL — ABNORMAL HIGH (ref 70–99)
Potassium: 3.8 mmol/L (ref 3.5–5.1)
Sodium: 134 mmol/L — ABNORMAL LOW (ref 135–145)

## 2019-11-06 LAB — URIC ACID: Uric Acid, Serum: 3.7 mg/dL (ref 3.7–8.6)

## 2019-11-06 MED ORDER — HYDROCODONE-ACETAMINOPHEN 5-325 MG PO TABS
1.0000 | ORAL_TABLET | Freq: Once | ORAL | Status: AC
  Administered 2019-11-06: 1 via ORAL
  Filled 2019-11-06: qty 1

## 2019-11-06 MED ORDER — HYDROCODONE-ACETAMINOPHEN 5-325 MG PO TABS: 1.0000 | ORAL_TABLET | Freq: Three times a day (TID) | ORAL | 0 refills | Status: AC | PRN

## 2019-11-06 NOTE — Discharge Instructions (Signed)
You are being treated for acute flare of your chronic knee pain. You may take 1 pain pill + 1 Tylenol 650 each morning and night, as needed. Rest with the legs elevated and apply ice to reduce swelling in the knee. Pump your feet and do simple knee exercises to reduce swelling in the legs. Follow-up with Dr. Sabra Heck, as planned. Return as needed.

## 2019-11-06 NOTE — ED Triage Notes (Signed)
Pt sent by PMD for knee redness and swelling. Concerned for gout or infection

## 2019-11-06 NOTE — ED Notes (Signed)
See triage note  States he bent his knee back  Having increased pain   Denies any fall

## 2019-11-06 NOTE — ED Provider Notes (Signed)
[facility] Emergency Department Provider Note ____________________________________________  Time seen: 1630  I have reviewed the triage vital signs and the nursing notes.  HISTORY  Chief Complaint  Knee Pain  HPI Justin Leonard is a 85 y.o. male presents to the ED accompanied by his adult son, for evaluation of acute on chronic right knee pain.  Patient was evaluated at his PCPs office today for ongoing right knee pain.  PCP was concerned for possible septic joint versus an acute gout flare.  Had been followed by Dr. Earnestine Leys in orthopedics, and had a Kenalog injection to the knee on 10/28.  The procedure provided intermittent relief until about a week ago.  Patient began to experience increasing knee pain and disability.  He has been less active secondary to the knee pain, and presents now for further evaluation.  Denies any interim injury, slip, fall.  Denies any chest pain, shortness of breath, fever, chills, sweats.  Patient has no history of gouty arthritis, but does have significant tricompartmental arthritis that has been managed conservatively.  Patient is also on Plavix daily for  A. fib.  He also had a recent right stent placed for stenosis.  Past Medical History:  Diagnosis Date  . Afib ([place])   . Anemia   . Arthritis   . Back pain   . CHF (congestive heart failure) ([place])   . COPD (chronic obstructive pulmonary disease) ([place])   . Coronary artery disease   . Deafness   . Depression   . Diabetes mellitus without complication ([place])   . Dizziness    when gets up  . GERD (gastroesophageal reflux disease)   . Hyperlipidemia   . Hypertension   . Melena   . Myocardial infarction ([place])    maybe a small one  . Obesity   . Oxygen decrease    WEARS HS,PRN  . Peripheral neuropathy   . Peripheral neuropathy    bil.hands and feet  . Skin cancer   . Umbilical hernia     Patient Active Problem List   Diagnosis Date Noted  . Carotid stenosis,  symptomatic w/o infarct, right 07/12/2019  . Symptomatic carotid artery stenosis 07/03/2019  . GERD (gastroesophageal reflux disease) 01/08/2019  . Atrial fibrillation with RVR ([place]) 01/08/2019  . Chest pain 12/05/2017  . Epigastric hernia   . PAH (pulmonary artery hypertension) ([place]) 10/13/2016  . Abdominal pain, bilateral upper quadrant 05/09/2016  . Diabetic hypoglycemia ([place]) 10/13/2015  . Chronic diastolic heart failure ([place]) 04/17/2015  . HTN (hypertension) 04/17/2015  . Diabetes ([place]) 04/17/2015  . COPD (chronic obstructive pulmonary disease) ([place]) 04/17/2015  . Clinical depression 08/23/2012  . Melanoma in situ ([place]) 08/23/2012  . Exomphalos 08/23/2012  . Major depressive disorder with single episode 08/23/2012  . Difficulty hearing 01/18/2012  . HLD (hyperlipidemia) 01/18/2012  . Peripheral nerve disease 01/18/2012  . Adiposity 01/18/2012    Past Surgical History:  Procedure Laterality Date  . CARDIAC CATHETERIZATION Left 07/17/2015   Procedure: Right/Left Heart Cath and Coronary Angiography;  Surgeon: [name], MD;  Location: [place] [facility];  Service: Cardiovascular;  Laterality: Left;  . CARDIAC CATHETERIZATION N/A 07/17/2015   Procedure: Coronary Stent Intervention;  Surgeon: [name], MD;  Location: [place] [facility];  Service: Cardiovascular;  Laterality: N/A;  . CARDIAC CATHETERIZATION Right 05/10/2016   Procedure: Left Heart Cath and Coronary Angiography;  Surgeon: [name], MD;  Location: [place] [facility];  Service: Cardiovascular;  Laterality:  Right;  Marland Kitchen CAROTID PTA/STENT INTERVENTION Right 07/12/2019   Procedure: CAROTID PTA/STENT INTERVENTION;  Surgeon: Algernon Huxley, MD;  Location: [place] [facility];  Service: Cardiovascular;  Laterality: Right;  . CHOLECYSTECTOMY N/A 11/04/2016   Procedure: LAPAROSCOPIC CHOLECYSTECTOMY, POSSIBLE OPEN umbilical hernia repair;  Surgeon: [name], MD;  Location: [facility] ORS;  Service: General;   Laterality: N/A;  . COLONOSCOPY WITH PROPOFOL N/A 12/12/2015   Procedure: COLONOSCOPY WITH PROPOFOL;  Surgeon: [name], MD;  Location: [facility] ENDOSCOPY;  Service: Gastroenterology;  Laterality: N/A;  . CORONARY ANGIOPLASTY     stent 8/16  . CORONARY ARTERY BYPASS GRAFT  1994  . ESOPHAGOGASTRODUODENOSCOPY N/A 11/10/2015   Procedure: ESOPHAGOGASTRODUODENOSCOPY (EGD);  Surgeon: [name], MD;  Location: [facility] ENDOSCOPY;  Service: Endoscopy;  Laterality: N/A;  . EYE SURGERY    . gall bladder drain    . MOHS SURGERY      Prior to Admission medications   Medication Sig Start Date End Date Taking? Authorizing Provider  acetaminophen (TYLENOL) 650 MG CR tablet Take 650 mg by mouth 2 (two) times daily as needed for pain.     [provider]  ferrous sulfate 325 (65 FE) MG tablet Take 325 mg by mouth 2 (two) times daily.     [provider]  FLUoxetine (PROZAC) 40 MG capsule Take 40 mg by mouth daily.  05/24/17   [provider]  hydrALAZINE (APRESOLINE) 50 MG tablet Take 50 mg by mouth 2 (two) times daily.     [provider]  HYDROcodone-acetaminophen (NORCO) 5-325 MG tablet Take 1 tablet by mouth 3 (three) times daily as needed for up to 5 days. 11/06/19 11/11/19  Jmarion Christiano, Dannielle Karvonen, PA-C  lovastatin (MEVACOR) 20 MG tablet Take 20 mg by mouth at bedtime.    [provider]  metFORMIN (GLUCOPHAGE) 500 MG tablet Take 500 mg by mouth 2 (two) times daily.     [provider]  metoprolol succinate (TOPROL-XL) 25 MG 24 hr tablet Take 12.5 mg by mouth daily.     [provider]  nitroGLYCERIN (NITROSTAT) 0.4 MG SL tablet Place 0.4 mg under the tongue every 5 (five) minutes as needed for chest pain.    [provider]  OXYGEN Place 2 L into the nose Nightly.    [provider]  pantoprazole (PROTONIX) 40 MG tablet Take 40 mg by mouth daily.  08/26/15   [provider]  Polyethylene Glycol 3350 (MIRALAX PO) Take 17 g  by mouth daily.    [provider]  ranolazine (RANEXA) 500 MG 12 hr tablet Take 500 mg by mouth 2 (two) times daily.     [provider]  tamsulosin (FLOMAX) 0.4 MG CAPS capsule Take 1 capsule (0.4 mg total) by mouth daily. 08/15/19   Zara Council A, PA-C  Tiotropium Bromide Monohydrate (SPIRIVA RESPIMAT) 2.5 MCG/ACT AERS Inhale 2 puffs into the lungs See admin instructions. Inhale 2 puffs 1 to 2 times a day.    [provider]  VENTOLIN HFA 108 (90 BASE) MCG/ACT inhaler Inhale 1-2 puffs into the lungs as needed for wheezing or shortness of breath. Reported on 12/16/2015 06/24/15   [provider]    Allergies Patient has no known allergies.  Family History  Problem Relation Age of Onset  . Hypertension Father   . CVA Father   . Anemia Neg Hx   . Arrhythmia Neg Hx   . Asthma Neg Hx   . Clotting  disorder Neg Hx   . Fainting Neg Hx   . Heart attack Neg Hx   . Heart disease Neg Hx   . Heart failure Neg Hx   . Hyperlipidemia Neg Hx   . Prostate cancer Neg Hx   . Bladder Cancer Neg Hx   . Kidney cancer Neg Hx     Social History Social History   Tobacco Use  . Smoking status: Former Smoker    Packs/day: 3.00    Years: 30.00    Pack years: 90.00    Types: Cigarettes    Quit date: 04/16/1978    Years since quitting: 41.5  . Smokeless tobacco: Never Used  . Tobacco comment: Quit smoking 1979  Substance Use Topics  . Alcohol use: No    Alcohol/week: 0.0 standard drinks  . Drug use: No    Review of Systems  Constitutional: Negative for fever. Cardiovascular: Negative for chest pain. Respiratory: Negative for shortness of breath. Gastrointestinal: Negative for abdominal pain, vomiting and diarrhea. Genitourinary: Negative for dysuria. Musculoskeletal: Negative for back pain. Right knee pain as above Skin: Negative for rash. Neurological: Negative for headaches, focal weakness or  numbness. ____________________________________________  PHYSICAL EXAM:  VITAL SIGNS: ED Triage Vitals  Enc Vitals Group     BP 11/06/19 1535 126/61     Pulse Rate 11/06/19 1535 (!) 119     Resp 11/06/19 1535 18     Temp 11/06/19 1535 98.2 F (36.8 C)     Temp Source 11/06/19 1535 Oral     SpO2 11/06/19 1535 96 %     Weight 11/06/19 1536 213 lb (96.6 kg)     Height 11/06/19 1536 6\' 2"  (1.88 m)     Head Circumference --      Peak Flow --      Pain Score 11/06/19 1540 8     Pain Loc --      Pain Edu? --      Excl. in [place]? --     Constitutional: Alert and oriented. Well appearing and in no distress. Head: Normocephalic and atraumatic. Eyes: Conjunctivae are normal. Normal extraocular movements Cardiovascular: Normal rate, regular rhythm. Normal distal pulses. 2 + pitting edema distally to bilateral LEs. No cyanosis, clubbing, erythema Respiratory: Normal respiratory effort. No wheezes/rales/rhonchi. Musculoskeletal: right knee with moderate joint effusion. overlying skin is without erythema, edema, or induration. Tenderness to the lateral joint line. Nontender with normal range of motion in all extremities.  Neurologic: Normal speech and language. No gross focal neurologic deficits are appreciated. Skin:  Skin is warm, dry and intact. No rash noted. ____________________________________________   LABS (pertinent positives/negatives)  Labs Reviewed  CBC WITH DIFFERENTIAL/PLATELET - Abnormal; Notable for the following components:      Result Value   RBC 3.23 (*)    Hemoglobin 10.1 (*)    HCT 30.3 (*)    Monocytes Absolute 1.1 (*)    All other components within normal limits  BASIC METABOLIC PANEL - Abnormal; Notable for the following components:   Sodium 134 (*)    Glucose, Bld 145 (*)    GFR calc non Af Amer 59 (*)    All other components within normal limits  URIC ACID  ____________________________________________   RADIOLOGY  DG Right Knee  IMPRESSION: 1. Moderate  knee effusion without definite acute bony abnormality. 2. Severe tricompartmental degenerative changes. ____________________________________________  PROCEDURES  Norco 5-325 mg PO Procedures ____________________________________________  INITIAL IMPRESSION / ASSESSMENT AND PLAN / ED COURSE  Patient with ED  evaluation of acute on chronic right knee pain with effusion.  Patient clinical picture is reassuring as it does not show a septic appearing joint.  Labs are reassuring and uric acid is negative.  X-ray does confirm moderate to severe tricompartmental arthritis.  Patient likely is experiencing acute knee pain and effusion secondary to his underlying arthropathy.  Patient and his son are reassured overall by the exam findings today.  Patient will be treated with hydrocodone for acute pain and is encouraged to take the medication as needed.  He is also advised to rest with the legs elevated to help reduce dependent edema.  He will follow-up with Ortho on Friday as scheduled, and should consider talking to the primary provider about restarting his previous fluid pill.  Questions were encouraged and answered.  Patient is discharged to the care of his adult son at this time.  Justin Leonard was evaluated in Emergency Department on 11/06/2019 for the symptoms described in the history of present illness. He was evaluated in the context of the global COVID-19 pandemic, which necessitated consideration that the patient might be at risk for infection with the SARS-CoV-2 virus that causes COVID-19. Institutional protocols and algorithms that pertain to the evaluation of patients at risk for COVID-19 are in a state of rapid change based on information released by regulatory bodies including the CDC and federal and state organizations. These policies and algorithms were followed during the patient's care in the ED.  I reviewed the patient's prescription history over the last 12 months in the multi-state  controlled substances database(s) that includes [place], [place], [place], [place], [place], [place], [place], [place], [place], [place], [place], [place], [place], and [place].  Results were notable for no RX history. ____________________________________________  FINAL CLINICAL IMPRESSION(S) / ED DIAGNOSES  Final diagnoses:  Chronic pain of right knee  Effusion of right knee  Primary osteoarthritis of right knee      Carmie End, Dannielle Karvonen, PA-C 11/06/19 2331    Vanessa Delta, MD 11/08/19 [phone]

## 2019-11-06 NOTE — ED Triage Notes (Signed)
Pt presents to ED via POV with c/o R knee pain. Pt states Dr. Sabra Heck gave patient a shot of cortisone to R knee. Pt states he bent his knee back and over extended his knee with pain noted ever since.   Pt presents with knee brace noted to R knee.

## 2020-01-08 ENCOUNTER — Ambulatory Visit: Payer: Self-pay | Admitting: Cardiovascular Disease

## 2020-01-21 ENCOUNTER — Other Ambulatory Visit (INDEPENDENT_AMBULATORY_CARE_PROVIDER_SITE_OTHER): Payer: Self-pay | Admitting: Vascular Surgery

## 2020-01-21 DIAGNOSIS — I6523 Occlusion and stenosis of bilateral carotid arteries: Secondary | ICD-10-CM

## 2020-01-21 DIAGNOSIS — Z95820 Peripheral vascular angioplasty status with implants and grafts: Secondary | ICD-10-CM

## 2020-01-22 ENCOUNTER — Ambulatory Visit (INDEPENDENT_AMBULATORY_CARE_PROVIDER_SITE_OTHER): Payer: Medicare PPO

## 2020-01-22 ENCOUNTER — Ambulatory Visit (INDEPENDENT_AMBULATORY_CARE_PROVIDER_SITE_OTHER): Payer: Medicare Other | Admitting: Vascular Surgery

## 2020-01-22 ENCOUNTER — Encounter (INDEPENDENT_AMBULATORY_CARE_PROVIDER_SITE_OTHER): Payer: Self-pay | Admitting: Vascular Surgery

## 2020-01-22 ENCOUNTER — Other Ambulatory Visit: Payer: Self-pay

## 2020-01-22 VITALS — BP 165/64 | HR 98 | Resp 10 | Ht 74.0 in | Wt 219.0 lb

## 2020-01-22 DIAGNOSIS — I4891 Unspecified atrial fibrillation: Secondary | ICD-10-CM | POA: Diagnosis not present

## 2020-01-22 DIAGNOSIS — E119 Type 2 diabetes mellitus without complications: Secondary | ICD-10-CM

## 2020-01-22 DIAGNOSIS — E785 Hyperlipidemia, unspecified: Secondary | ICD-10-CM | POA: Diagnosis not present

## 2020-01-22 DIAGNOSIS — I6523 Occlusion and stenosis of bilateral carotid arteries: Secondary | ICD-10-CM

## 2020-01-22 DIAGNOSIS — Z95820 Peripheral vascular angioplasty status with implants and grafts: Secondary | ICD-10-CM | POA: Diagnosis not present

## 2020-01-22 DIAGNOSIS — I10 Essential (primary) hypertension: Secondary | ICD-10-CM

## 2020-01-22 DIAGNOSIS — Z794 Long term (current) use of insulin: Secondary | ICD-10-CM

## 2020-01-22 NOTE — Assessment & Plan Note (Signed)
Approximately 6 months status post right carotid stent placement for high-grade symptomatic stenosis.  Stent is widely patent and the left carotid artery has mild disease in the 1 to 39% range.  Overall doing well.  Continue current medical regimen.  Recheck in 6 months.

## 2020-01-22 NOTE — Progress Notes (Signed)
MRN : [id number]  Justin Leonard is a 86 y.o. (Jun 26, 1927) male who presents with chief complaint of  Chief Complaint  Patient presents with  . Follow-up    U/S follow up  .  History of Present Illness: Patient is doing well today.  He is about 6 months status post right carotid artery stent placement for high-grade symptomatic stenosis.  No new complaints or problems since his last visit.  Had his follow-up duplex today. Stent is widely patent and the left carotid artery has mild disease in the 1 to 39% range.   Current Outpatient Medications  Medication Sig Dispense Refill  . acetaminophen (TYLENOL) 650 MG CR tablet Take 650 mg by mouth 2 (two) times daily as needed for pain.     Marland Kitchen clopidogrel (PLAVIX) 75 MG tablet clopidogrel 75 mg tablet  TAKE 1 TABLET BY MOUTH ONCE DAILY    . ferrous sulfate 325 (65 FE) MG tablet Take 325 mg by mouth 2 (two) times daily.     Marland Kitchen FLUoxetine (PROZAC) 40 MG capsule Take 40 mg by mouth daily.     . furosemide (LASIX) 40 MG tablet furosemide 40 mg tablet    . hydrALAZINE (APRESOLINE) 50 MG tablet Take 50 mg by mouth 2 (two) times daily.     Marland Kitchen lovastatin (MEVACOR) 20 MG tablet Take 20 mg by mouth at bedtime.    . metoprolol succinate (TOPROL-XL) 25 MG 24 hr tablet Take 12.5 mg by mouth daily.     . nitroGLYCERIN (NITROSTAT) 0.4 MG SL tablet Place 0.4 mg under the tongue every 5 (five) minutes as needed for chest pain.    Marland Kitchen ondansetron (ZOFRAN) 4 MG tablet Take by mouth.    . OXYGEN Place 2 L into the nose Nightly.    . pantoprazole (PROTONIX) 40 MG tablet Take 40 mg by mouth daily.     . Polyethylene Glycol 3350 (MIRALAX PO) Take 17 g by mouth daily.    . ranolazine (RANEXA) 500 MG 12 hr tablet Take 500 mg by mouth 2 (two) times daily.     . tamsulosin (FLOMAX) 0.4 MG CAPS capsule Take 1 capsule (0.4 mg total) by mouth daily. 90 capsule 3  . Tiotropium Bromide Monohydrate (SPIRIVA RESPIMAT) 2.5 MCG/ACT AERS Inhale 2 puffs into the lungs See admin  instructions. Inhale 2 puffs 1 to 2 times a day.    . VENTOLIN HFA 108 (90 BASE) MCG/ACT inhaler Inhale 1-2 puffs into the lungs as needed for wheezing or shortness of breath. Reported on 12/16/2015    . metFORMIN (GLUCOPHAGE) 500 MG tablet Take 500 mg by mouth 2 (two) times daily.      No current facility-administered medications for this visit.    Past Medical History:  Diagnosis Date  . Afib ([place])   . Anemia   . Arthritis   . Back pain   . CHF (congestive heart failure) ([place])   . COPD (chronic obstructive pulmonary disease) ([place])   . Coronary artery disease   . Deafness   . Depression   . Diabetes mellitus without complication ([place])   . Dizziness    when gets up  . GERD (gastroesophageal reflux disease)   . Hyperlipidemia   . Hypertension   . Melena   . Myocardial infarction ([place])    maybe a small one  . Obesity   . Oxygen decrease    WEARS HS,PRN  . Peripheral neuropathy   . Peripheral neuropathy    bil.hands and  feet  . Skin cancer   . Umbilical hernia     Past Surgical History:  Procedure Laterality Date  . CARDIAC CATHETERIZATION Left 07/17/2015   Procedure: Right/Left Heart Cath and Coronary Angiography;  Surgeon: [name], MD;  Location: [place] [facility];  Service: Cardiovascular;  Laterality: Left;  . CARDIAC CATHETERIZATION N/A 07/17/2015   Procedure: Coronary Stent Intervention;  Surgeon: [name], MD;  Location: [place] [facility];  Service: Cardiovascular;  Laterality: N/A;  . CARDIAC CATHETERIZATION Right 05/10/2016   Procedure: Left Heart Cath and Coronary Angiography;  Surgeon: [name], MD;  Location: [place] [facility];  Service: Cardiovascular;  Laterality: Right;  . CAROTID PTA/STENT INTERVENTION Right 07/12/2019   Procedure: CAROTID PTA/STENT INTERVENTION;  Surgeon: Algernon Huxley, MD;  Location: [place] [facility];  Service: Cardiovascular;  Laterality: Right;  . CHOLECYSTECTOMY N/A 11/04/2016   Procedure: LAPAROSCOPIC  CHOLECYSTECTOMY, POSSIBLE OPEN umbilical hernia repair;  Surgeon: [name], MD;  Location: [facility];  Service: General;  Laterality: N/A;  . COLONOSCOPY WITH PROPOFOL N/A 12/12/2015   Procedure: COLONOSCOPY WITH PROPOFOL;  Surgeon: [name], MD;  Location: [facility] ENDOSCOPY;  Service: Gastroenterology;  Laterality: N/A;  . CORONARY ANGIOPLASTY     stent 8/16  . CORONARY ARTERY BYPASS GRAFT  1994  . ESOPHAGOGASTRODUODENOSCOPY N/A 11/10/2015   Procedure: ESOPHAGOGASTRODUODENOSCOPY (EGD);  Surgeon: [name], MD;  Location: [facility] ENDOSCOPY;  Service: Endoscopy;  Laterality: N/A;  . EYE SURGERY    . gall bladder drain    . MOHS SURGERY       Social History   Tobacco Use  . Smoking status: Former Smoker    Packs/day: 3.00    Years: 30.00    Pack years: 90.00    Types: Cigarettes    Quit date: 04/16/1978    Years since quitting: 41.7  . Smokeless tobacco: Never Used  . Tobacco comment: Quit smoking 1979  Substance Use Topics  . Alcohol use: No    Alcohol/week: 0.0 standard drinks  . Drug use: No    Family History  Problem Relation Age of Onset  . Hypertension Father   . CVA Father   . Anemia Neg Hx   . Arrhythmia Neg Hx   . Asthma Neg Hx   . Clotting disorder Neg Hx   . Fainting Neg Hx   . Heart attack Neg Hx   . Heart disease Neg Hx   . Heart failure Neg Hx   . Hyperlipidemia Neg Hx   . Prostate cancer Neg Hx   . Bladder Cancer Neg Hx   . Kidney cancer Neg Hx      Allergies  Allergen Reactions  . No Known Allergies     REVIEW OF SYSTEMS (Negative unless checked)  Constitutional: [] ?Weight loss  [] ?Fever  [] ?Chills Cardiac: [] ?Chest pain   [] ?Chest pressure   [] ?Palpitations   [] ?Shortness of breath when laying flat   [x] ?Shortness of breath at rest   [x] ?Shortness of breath with exertion. Vascular:  [] ?Pain in legs with walking   [] ?Pain in legs at rest   [] ?Pain in legs when laying flat   [] ?Claudication   [] ?Pain in feet when walking  [] ?Pain in feet at rest   [] ?Pain in feet when laying flat   [] ?History of DVT   [] ?Phlebitis   [] ?Swelling in legs   [] ?Varicose veins   [] ?Non-healing ulcers Pulmonary:   [] ?Uses home oxygen   [] ?Productive cough   [] ?Hemoptysis   [] ?  Wheeze  [x] ?COPD   [] ?Asthma Neurologic:  [x] ?Dizziness  [] ?Blackouts   [] ?Seizures   [] ?History of stroke   [] ?History of TIA  [] ?Aphasia   [x] ?Temporary blindness   [] ?Dysphagia   [] ?Weakness or numbness in arms   [] ?Weakness or numbness in legs Musculoskeletal:  [x] ?Arthritis   [] ?Joint swelling   [x] ?Joint pain   [] ?Low back pain Hematologic:  [] ?Easy bruising  [] ?Easy bleeding   [] ?Hypercoagulable state   [] ?Anemic  [] ?Hepatitis Gastrointestinal:  [] ?Blood in stool   [] ?Vomiting blood  [] ?Gastroesophageal reflux/heartburn   [] ?Abdominal pain Genitourinary:  [] ?Chronic kidney disease   [] ?Difficult urination  [] ?Frequent urination  [] ?Burning with urination   [] ?Hematuria Skin:  [] ?Rashes   [] ?Ulcers   [] ?Wounds Psychological:  [] ?History of anxiety   [] ? History of major depression.  Physical Examination  Vitals:   01/22/20 1438  BP: (!) 165/64  Pulse: 98  Resp: 10  Weight: 219 lb (99.3 kg)  Height: 6\' 2"  (1.88 m)   Body mass index is 28.12 kg/m. Gen:  WD/WN, NAD Head: [place]/AT, No temporalis wasting. Ear/Nose/Throat: Hearing aids in place, nares w/o erythema or drainage, trachea midline Eyes: Conjunctiva clear. Sclera non-icteric Neck: Supple.  No bruit  Pulmonary:  Good air movement, equal and clear to auscultation bilaterally.  Cardiac: RRR, No JVD Vascular:  Vessel Right Left  Radial Palpable Palpable               Musculoskeletal: M/S 5/5 throughout.  No deformity or atrophy. Walks with a walker, mild LE edema. Neurologic: CN 2-12 intact. Sensation grossly intact in extremities.  Symmetrical.  Speech is fluent. Motor exam as listed above. Psychiatric: Judgment intact, Mood & affect appropriate for pt's clinical situation. Dermatologic: No rashes or ulcers noted.   No cellulitis or open wounds.      CBC Lab Results  Component Value Date   WBC 9.6 11/06/2019   HGB 10.1 (L) 11/06/2019   HCT 30.3 (L) 11/06/2019   MCV 93.8 11/06/2019   PLT 169 11/06/2019    BMET    Component Value Date/Time   NA 134 (L) 11/06/2019 1707   NA 139 06/24/2014 1501   K 3.8 11/06/2019 1707   K 4.6 06/24/2014 1501   CL 102 11/06/2019 1707   CL 106 06/24/2014 1501   CO2 23 11/06/2019 1707   CO2 26 06/24/2014 1501   GLUCOSE 145 (H) 11/06/2019 1707   GLUCOSE 118 (H) 06/24/2014 1501   BUN 14 11/06/2019 1707   BUN 22 (H) 06/24/2014 1501   CREATININE 1.09 11/06/2019 1707   CREATININE 1.45 (H) 06/24/2014 1501   CALCIUM 9.5 11/06/2019 1707   CALCIUM 9.1 06/24/2014 1501   GFRNONAA 59 (L) 11/06/2019 1707   GFRNONAA 44 (L) 06/24/2014 1501   GFRAA >60 11/06/2019 1707   GFRAA 51 (L) 06/24/2014 1501   CrCl cannot be calculated (Patient's most recent lab result is older than the maximum 21 days allowed.).  COAG Lab Results  Component Value Date   INR 0.97 01/08/2019   INR 0.97 10/25/2016   INR 1.16 09/21/2016    Radiology No results found.   Assessment/Plan HTN (hypertension) blood pressure control important in reducing the progression of atherosclerotic disease. On appropriate oral medications.   Atrial fibrillation with RVR ([name]) Rate controlled and on anticoagulation  Diabetes ([name]) blood glucose control important in reducing the progression of atherosclerotic disease. Also, involved in wound healing. On appropriate medications.   HLD (hyperlipidemia) lipid control important in reducing the progression of atherosclerotic disease. Continue  statin therapy   Symptomatic carotid artery stenosis Approximately 6 months status post right carotid stent placement for high-grade symptomatic stenosis.  Stent is widely patent and the left carotid artery has mild disease in the 1 to 39% range.  Overall doing well.  Continue current medical regimen.   Recheck in 6 months.    Leotis Pain, MD  01/22/2020 3:16 PM    This note was created with Dragon medical transcription system.  Any errors from dictation are purely unintentional

## 2020-04-11 ENCOUNTER — Ambulatory Visit (INDEPENDENT_AMBULATORY_CARE_PROVIDER_SITE_OTHER): Payer: Medicare PPO | Admitting: Vascular Surgery

## 2020-04-11 ENCOUNTER — Other Ambulatory Visit: Payer: Self-pay

## 2020-04-11 ENCOUNTER — Encounter (INDEPENDENT_AMBULATORY_CARE_PROVIDER_SITE_OTHER): Payer: Self-pay | Admitting: Vascular Surgery

## 2020-04-11 VITALS — BP 129/55 | HR 69 | Resp 16 | Ht 74.0 in | Wt 225.4 lb

## 2020-04-11 DIAGNOSIS — I4891 Unspecified atrial fibrillation: Secondary | ICD-10-CM | POA: Diagnosis not present

## 2020-04-11 DIAGNOSIS — E785 Hyperlipidemia, unspecified: Secondary | ICD-10-CM | POA: Diagnosis not present

## 2020-04-11 DIAGNOSIS — E119 Type 2 diabetes mellitus without complications: Secondary | ICD-10-CM | POA: Diagnosis not present

## 2020-04-11 DIAGNOSIS — I10 Essential (primary) hypertension: Secondary | ICD-10-CM | POA: Diagnosis not present

## 2020-04-11 DIAGNOSIS — I6523 Occlusion and stenosis of bilateral carotid arteries: Secondary | ICD-10-CM

## 2020-04-11 DIAGNOSIS — Z794 Long term (current) use of insulin: Secondary | ICD-10-CM

## 2020-04-11 NOTE — Progress Notes (Signed)
MRN : [id number]  Justin Leonard is a 86 y.o. (1927/11/12) male who presents with chief complaint of  Chief Complaint  Patient presents with  . Follow-up    ref Manuella Ghazi left carotid occulsion <70%  .  History of Present Illness: Patient presents earlier than his scheduled follow-up visit due to a recent CT scan performed by his cardiologist suggesting worsening carotid disease.  We saw him earlier this year and his carotid disease was found to be fairly mild on the left side.  His right carotid stent was patent.  He was seen by his cardiologist last month where a duplex had suggested worsening carotid artery stenosis which prompted a CT scan.  I do not yet have the images for review, but have requested them we will plan to evaluate them once I see them.  The official report is of a 70% or greater left ICA stenosis just beyond the origin.  Mention is made of the carotid stent on the right without mention of significant stenosis.  The patient is currently asymptomatic.  He was symptomatic prior to the treatment of his right carotid stenosis previously, but has had no recent focal neurologic symptoms. Specifically, the patient denies amaurosis fugax, speech or swallowing difficulties, or arm or leg weakness or numbness   Current Outpatient Medications  Medication Sig Dispense Refill  . acetaminophen (TYLENOL) 650 MG CR tablet Take 650 mg by mouth 2 (two) times daily as needed for pain.     Marland Kitchen clopidogrel (PLAVIX) 75 MG tablet clopidogrel 75 mg tablet  TAKE 1 TABLET BY MOUTH ONCE DAILY    . ferrous sulfate 325 (65 FE) MG tablet Take 325 mg by mouth 2 (two) times daily.     Marland Kitchen FLUoxetine (PROZAC) 40 MG capsule Take 40 mg by mouth daily.     . hydrALAZINE (APRESOLINE) 25 MG tablet     . losartan (COZAAR) 25 MG tablet     . lovastatin (MEVACOR) 20 MG tablet Take 20 mg by mouth at bedtime.    . metoprolol succinate (TOPROL-XL) 25 MG 24 hr tablet Take 12.5 mg by mouth daily.     . nitroGLYCERIN  (NITROSTAT) 0.4 MG SL tablet Place 0.4 mg under the tongue every 5 (five) minutes as needed for chest pain.    . OXYGEN Place 2 L into the nose Nightly.    . pantoprazole (PROTONIX) 40 MG tablet Take 40 mg by mouth daily.     . Polyethylene Glycol 3350 (MIRALAX PO) Take 17 g by mouth daily.    . ranolazine (RANEXA) 500 MG 12 hr tablet Take 500 mg by mouth 2 (two) times daily.     Marland Kitchen spironolactone (ALDACTONE) 25 MG tablet     . tamsulosin (FLOMAX) 0.4 MG CAPS capsule Take 1 capsule (0.4 mg total) by mouth daily. 90 capsule 3  . Tiotropium Bromide Monohydrate (SPIRIVA RESPIMAT) 2.5 MCG/ACT AERS Inhale 2 puffs into the lungs See admin instructions. Inhale 2 puffs 1 to 2 times a day.    . torsemide (DEMADEX) 20 MG tablet     . VENTOLIN HFA 108 (90 BASE) MCG/ACT inhaler Inhale 1-2 puffs into the lungs as needed for wheezing or shortness of breath. Reported on 12/16/2015    . furosemide (LASIX) 40 MG tablet furosemide 40 mg tablet    . hydrALAZINE (APRESOLINE) 50 MG tablet Take 50 mg by mouth 2 (two) times daily.     . metFORMIN (GLUCOPHAGE) 500 MG tablet Take 500 mg by  mouth 2 (two) times daily.      No current facility-administered medications for this visit.    Past Medical History:  Diagnosis Date  . Afib ([place])   . Anemia   . Arthritis   . Back pain   . CHF (congestive heart failure) ([place])   . COPD (chronic obstructive pulmonary disease) ([place])   . Coronary artery disease   . Deafness   . Depression   . Diabetes mellitus without complication ([place])   . Dizziness    when gets up  . GERD (gastroesophageal reflux disease)   . Hyperlipidemia   . Hypertension   . Melena   . Myocardial infarction ([place])    maybe a small one  . Obesity   . Oxygen decrease    WEARS HS,PRN  . Peripheral neuropathy   . Peripheral neuropathy    bil.hands and feet  . Skin cancer   . Umbilical hernia     Past Surgical History:  Procedure Laterality Date  . CARDIAC CATHETERIZATION Left 07/17/2015    Procedure: Right/Left Heart Cath and Coronary Angiography;  Surgeon: [name], MD;  Location: [facility];  Service: Cardiovascular;  Laterality: Left;  . CARDIAC CATHETERIZATION N/A 07/17/2015   Procedure: Coronary Stent Intervention;  Surgeon: [name], MD;  Location: [facility];  Service: Cardiovascular;  Laterality: N/A;  . CARDIAC CATHETERIZATION Right 05/10/2016   Procedure: Left Heart Cath and Coronary Angiography;  Surgeon: [name], MD;  Location: [facility];  Service: Cardiovascular;  Laterality: Right;  . CAROTID PTA/STENT INTERVENTION Right 07/12/2019   Procedure: CAROTID PTA/STENT INTERVENTION;  Surgeon: Algernon Huxley, MD;  Location: [facility];  Service: Cardiovascular;  Laterality: Right;  . CHOLECYSTECTOMY N/A 11/04/2016   Procedure: LAPAROSCOPIC CHOLECYSTECTOMY, POSSIBLE OPEN umbilical hernia repair;  Surgeon: [name], MD;  Location: [facility];  Service: General;  Laterality: N/A;  . COLONOSCOPY WITH PROPOFOL N/A 12/12/2015   Procedure: COLONOSCOPY WITH PROPOFOL;  Surgeon: [name], MD;  Location: [facility];  Service: Gastroenterology;  Laterality: N/A;  . CORONARY ANGIOPLASTY     stent 8/16  . CORONARY ARTERY BYPASS GRAFT  1994  . ESOPHAGOGASTRODUODENOSCOPY N/A 11/10/2015   Procedure: ESOPHAGOGASTRODUODENOSCOPY (EGD);  Surgeon: [name], MD;  Location: [facility];  Service: Endoscopy;  Laterality: N/A;  . EYE SURGERY    . gall bladder drain    . MOHS SURGERY       Social History   Tobacco Use  . Smoking status: Former Smoker    Packs/day: 3.00    Years: 30.00    Pack years: 90.00    Types: Cigarettes    Quit date: 04/16/1978    Years since quitting: 42.0  . Smokeless tobacco: Never Used  . Tobacco comment: Quit smoking 1979  Substance Use Topics  . Alcohol use: No    Alcohol/week: 0.0 standard drinks  . Drug use: No      Family History  Problem Relation Age of Onset  . Hypertension  Father   . CVA Father   . Anemia Neg Hx   . Arrhythmia Neg Hx   . Asthma Neg Hx   . Clotting disorder Neg Hx   . Fainting Neg Hx   . Heart attack Neg Hx   . Heart disease Neg Hx   . Heart failure Neg Hx   . Hyperlipidemia Neg Hx   . Prostate cancer Neg Hx   . Bladder Cancer Neg  Hx   . Kidney cancer Neg Hx      Allergies  Allergen Reactions  . No Known Allergies     REVIEW OF SYSTEMS(Negative unless checked)  Constitutional: [] ??Weight loss[] ??Fever[] ??Chills Cardiac:[] ??Chest pain[] ??Chest pressure[] ??Palpitations [] ??Shortness of breath when laying flat [x] ??Shortness of breath at rest [x] ??Shortness of breath with exertion. Vascular: [] ??Pain in legs with walking[] ??Pain in legsat rest[] ??Pain in legs when laying flat [] ??Claudication [] ??Pain in feet when walking [] ??Pain in feet at rest [] ??Pain in feet when laying flat [] ??History of DVT [] ??Phlebitis [] ??Swelling in legs [] ??Varicose veins [] ??Non-healing ulcers Pulmonary: [] ??Uses home oxygen [] ??Productive cough[] ??Hemoptysis [] ??Wheeze [x] ??COPD [] ??Asthma Neurologic: [x] ??Dizziness [] ??Blackouts [] ??Seizures [] ??History of stroke [] ??History of TIA[] ??Aphasia [x] ??Temporary blindness[] ??Dysphagia [] ??Weaknessor numbness in arms [] ??Weakness or numbnessin legs Musculoskeletal: [x] ??Arthritis [] ??Joint swelling [x] ??Joint pain [] ??Low back pain Hematologic:[] ??Easy bruising[] ??Easy bleeding [] ??Hypercoagulable state [] ??Anemic [] ??Hepatitis Gastrointestinal:[] ??Blood in stool[] ??Vomiting blood[] ??Gastroesophageal reflux/heartburn[] ??Abdominal pain Genitourinary: [] ??Chronic kidney disease [] ??Difficulturination [] ??Frequenturination [] ??Burning with urination[] ??Hematuria Skin: [] ??Rashes [] ??Ulcers [] ??Wounds Psychological: [] ??History of anxiety[] ??History of major depression.  Physical  Examination  Vitals:   04/11/20 1036 04/11/20 1037  BP: (!) 116/50 (!) 129/55  Pulse: 69   Resp: 16   Weight: 225 lb 6.4 oz (102.2 kg)   Height: 6\' 2"  (1.88 m)    Body mass index is 28.94 kg/m. Gen:  WD/WN, NAD.  Appears younger than stated age Head: [place]/AT, No temporalis wasting. Ear/Nose/Throat: Hearing diminished, nares w/o erythema or drainage, trachea midline Eyes: Conjunctiva clear. Sclera non-icteric Neck: Supple.  No bruit  Pulmonary:  Good air movement, equal and clear to auscultation bilaterally.  Cardiac: Irregular Vascular:  Vessel Right Left  Radial Palpable Palpable       Musculoskeletal: M/S 5/5 throughout.  No deformity or atrophy.  Trace lower extremity edema. Neurologic: CN 2-12 intact. Sensation grossly intact in extremities.  Symmetrical.  Speech is fluent. Motor exam as listed above. Psychiatric: Judgment intact, Mood & affect appropriate for pt's clinical situation. Dermatologic: No rashes or ulcers noted.  No cellulitis or open wounds.      CBC Lab Results  Component Value Date   WBC 9.6 11/06/2019   HGB 10.1 (L) 11/06/2019   HCT 30.3 (L) 11/06/2019   MCV 93.8 11/06/2019   PLT 169 11/06/2019    BMET    Component Value Date/Time   NA 134 (L) 11/06/2019 1707   NA 139 06/24/2014 1501   K 3.8 11/06/2019 1707   K 4.6 06/24/2014 1501   CL 102 11/06/2019 1707   CL 106 06/24/2014 1501   CO2 23 11/06/2019 1707   CO2 26 06/24/2014 1501   GLUCOSE 145 (H) 11/06/2019 1707   GLUCOSE 118 (H) 06/24/2014 1501   BUN 14 11/06/2019 1707   BUN 22 (H) 06/24/2014 1501   CREATININE 1.09 11/06/2019 1707   CREATININE 1.45 (H) 06/24/2014 1501   CALCIUM 9.5 11/06/2019 1707   CALCIUM 9.1 06/24/2014 1501   GFRNONAA 59 (L) 11/06/2019 1707   GFRNONAA 44 (L) 06/24/2014 1501   GFRAA >60 11/06/2019 1707   GFRAA 51 (L) 06/24/2014 1501   CrCl cannot be calculated (Patient's most recent lab result is older than the maximum 21 days allowed.).  COAG Lab Results   Component Value Date   INR 0.97 01/08/2019   INR 0.97 10/25/2016   INR 1.16 09/21/2016    Radiology No results found.   Assessment/Plan HTN (hypertension) blood pressure control important in reducing the progression of atherosclerotic disease. On appropriate oral medications.   Atrial fibrillation with RVR ([name]) Rate controlled and on anticoagulation  Diabetes (  [place]) blood glucose control important in reducing the progression of atherosclerotic disease. Also, involved in wound healing. On appropriate medications.   HLD (hyperlipidemia) lipid control important in reducing the progression of atherosclerotic disease. Continue statin therapy  Carotid stenosis, bilateral We had seen the patient earlier this year, and his carotid disease was fairly mild at that point.  A recent CT scan done by his cardiologist has the official report of a 70% or greater left ICA stenosis.  I have not had a chance to yet review this study, but we will plan to review this next week when I get a disc and contact the patient to discuss the results.  If he has a 70% stenosis at the age of 81 with no symptoms, I would not recommend any intervention.  If he has a critical stenosis or symptoms, we may consider carotid stenting which she has already had on the right and did well from.  He is likely a poor surgical candidate for open endarterectomy.  I will review his CT scan once I receive the images.    Leotis Pain, MD  04/11/2020 2:00 PM    This note was created with Dragon medical transcription system.  Any errors from dictation are purely unintentional

## 2020-04-11 NOTE — Patient Instructions (Signed)
Carotid Artery Disease  Carotid artery disease is the narrowing or blockage of one or both carotid arteries. This condition is also called carotid artery stenosis. The carotid arteries are the two main blood vessels on either side of the neck. They send blood to the brain, other parts of the head, and the neck.  This condition increases your risk for a stroke or a transient ischemic attack (TIA). A TIA is a "mini-stroke" that causes stroke-like symptoms that go away quickly. What are the causes? This condition is mainly caused by a narrowing and hardening of the carotid arteries. The carotid arteries can become narrow or clogged with a buildup of plaque. Plaque includes:  Fat.  Cholesterol.  Calcium.  Other substances. What increases the risk? The following factors may make you more likely to develop this condition:  Having certain medical conditions, such as: ? High cholesterol. ? High blood pressure. ? Diabetes. ? Obesity.  Smoking.  A family history of cardiovascular disease.  Not being active or lack of regular exercise.  Being male. Men have a higher risk of having arteries become narrow and harden earlier in life than women.  Old age. What are the signs or symptoms? This condition may not have any signs or symptoms until a stroke or TIA happens. In some cases, your doctor may be able to hear a whooshing sound. This can suggest a change in blood flow caused by plaque buildup. An eye exam can also help find signs of the condition. How is this treated? This condition may be treated with more than one treatment. Treatment options include:  Lifestyle changes, such as: ? Quitting smoking. ? Getting regular exercise, or getting exercise as told by your doctor. ? Eating a healthy diet. ? Managing stress. ? Keeping a healthy weight.  Medicines to control: ? Blood pressure. ? Cholesterol. ? Blood clotting.  Surgery. You may have: ? A surgery to remove the blockages in  the carotid arteries. ? A procedure in which a small mesh tube (stent) is used to widen the blocked carotid arteries. Follow these instructions at home: Eating and drinking Follow instructions about your diet from your doctor. It is important to follow a healthy diet.  Eat a diet that includes: ? A lot of fresh fruits and vegetables. ? Low-fat (lean) meats.  Avoid these foods: ? Foods that are high in fat. ? Foods that are high in salt (sodium). ? Foods that are fried. ? Foods that are processed. ? Foods that have few good nutrients (poor nutritional value).  Lifestyle   Keep a healthy weight.  Do exercises as told by your doctor to stay active. Each week, you should get one of the following: ? At least 150 minutes of exercise that raises your heart rate and makes you sweat (moderate-intensity exercise). ? At least 75 minutes of exercise that takes a lot of effort.  Do not use any products that contain nicotine or tobacco, such as cigarettes, e-cigarettes, and chewing tobacco. If you need help quitting, ask your doctor.  Do not drink alcohol if: ? Your doctor tells you not to drink. ? You are pregnant, may be pregnant, or are planning to become pregnant.  If you drink alcohol: ? Limit how much you use to:  0-1 drink a day for women.  0-2 drinks a day for men. ? Be aware of how much alcohol is in your drink. In the [place] one drink equals one 12 oz bottle of beer (355 mL), one 5   oz glass of wine (148 mL), or one 1 oz glass of hard liquor (44 mL).  Do not use drugs.  Manage your stress. Ask your doctor for tips on how to do this. General instructions  Take over-the-counter and prescription medicines only as told by your doctor.  Keep all follow-up visits as told by your doctor. This is important. Where to find more information  American Heart Association: www.heart.org Get help right away if:  You have any signs of a stroke. "BE FAST" is an easy way to remember the  main warning signs: ? B - Balance. Signs are dizziness, sudden trouble walking, or loss of balance. ? E - Eyes. Signs are trouble seeing or a change in how you see. ? F - Face. Signs are sudden weakness or loss of feeling of the face, or the face or eyelid drooping on one side. ? A - Arms. Signs are weakness or loss of feeling in an arm. This happens suddenly and usually on one side of the body. ? S - Speech. Signs are sudden trouble speaking, slurred speech, or trouble understanding what people say. ? T - Time. Time to call emergency services. Write down what time symptoms started.  You have other signs of a stroke, such as: ? A sudden, very bad headache with no known cause. ? Feeling like you may vomit (nausea). ? Vomiting. ? A seizure. These symptoms may be an emergency. Do not wait to see if the symptoms will go away. Get medical help right away. Call your local emergency services (911 in the [place] Do not drive yourself to the hospital. Summary  The carotid arteries are blood vessels on both sides of the neck.  If these arteries get smaller or get blocked, you are more likely to have a stroke or a mini-stroke.  This condition can be treated with lifestyle changes, medicines, surgery, or a blend of these treatments.  Get help right away if you have any signs of a stroke. "BE FAST" is an easy way to remember the main warning signs of stroke. This information is not intended to replace advice given to you by your health care provider. Make sure you discuss any questions you have with your health care provider. Document Revised: 06/11/2019 Document Reviewed: 06/11/2019 Elsevier Patient Education  2020 [employer]vier Inc[employer]  

## 2020-04-11 NOTE — Assessment & Plan Note (Signed)
We had seen the patient earlier this year, and his carotid disease was fairly mild at that point.  A recent CT scan done by his cardiologist has the official report of a 70% or greater left ICA stenosis.  I have not had a chance to yet review this study, but we will plan to review this next week when I get a disc and contact the patient to discuss the results.  If he has a 70% stenosis at the age of 63 with no symptoms, I would not recommend any intervention.  If he has a critical stenosis or symptoms, we may consider carotid stenting which she has already had on the right and did well from.  He is likely a poor surgical candidate for open endarterectomy.  I will review his CT scan once I receive the images.

## 2020-05-26 ENCOUNTER — Other Ambulatory Visit: Payer: Self-pay | Admitting: Nephrology

## 2020-05-26 ENCOUNTER — Other Ambulatory Visit (HOSPITAL_COMMUNITY): Payer: Self-pay | Admitting: Nephrology

## 2020-05-26 DIAGNOSIS — N1832 Chronic kidney disease, stage 3b: Secondary | ICD-10-CM

## 2020-05-26 DIAGNOSIS — E1122 Type 2 diabetes mellitus with diabetic chronic kidney disease: Secondary | ICD-10-CM

## 2020-06-04 ENCOUNTER — Ambulatory Visit
Admission: RE | Admit: 2020-06-04 | Discharge: 2020-06-04 | Disposition: A | Discharge status: Home / Self Care | Payer: Medicare PPO | Source: Ambulatory Visit | Attending: Nephrology | Admitting: Nephrology

## 2020-06-04 ENCOUNTER — Other Ambulatory Visit: Payer: Self-pay

## 2020-06-04 DIAGNOSIS — N1832 Chronic kidney disease, stage 3b: Secondary | ICD-10-CM | POA: Diagnosis present

## 2020-06-04 DIAGNOSIS — E1122 Type 2 diabetes mellitus with diabetic chronic kidney disease: Secondary | ICD-10-CM | POA: Insufficient documentation

## 2020-07-22 ENCOUNTER — Ambulatory Visit (INDEPENDENT_AMBULATORY_CARE_PROVIDER_SITE_OTHER): Payer: Medicare PPO

## 2020-07-22 ENCOUNTER — Ambulatory Visit (INDEPENDENT_AMBULATORY_CARE_PROVIDER_SITE_OTHER): Payer: Medicare PPO | Admitting: Vascular Surgery

## 2020-07-22 ENCOUNTER — Other Ambulatory Visit: Payer: Self-pay

## 2020-07-22 ENCOUNTER — Encounter (INDEPENDENT_AMBULATORY_CARE_PROVIDER_SITE_OTHER): Payer: Self-pay | Admitting: Vascular Surgery

## 2020-07-22 VITALS — BP 118/57 | HR 71 | Resp 16 | Wt 206.0 lb

## 2020-07-22 DIAGNOSIS — I10 Essential (primary) hypertension: Secondary | ICD-10-CM | POA: Diagnosis not present

## 2020-07-22 DIAGNOSIS — I4891 Unspecified atrial fibrillation: Secondary | ICD-10-CM

## 2020-07-22 DIAGNOSIS — I2510 Atherosclerotic heart disease of native coronary artery without angina pectoris: Secondary | ICD-10-CM | POA: Diagnosis not present

## 2020-07-22 DIAGNOSIS — Z794 Long term (current) use of insulin: Secondary | ICD-10-CM

## 2020-07-22 DIAGNOSIS — E119 Type 2 diabetes mellitus without complications: Secondary | ICD-10-CM

## 2020-07-22 DIAGNOSIS — E785 Hyperlipidemia, unspecified: Secondary | ICD-10-CM

## 2020-07-22 DIAGNOSIS — I6523 Occlusion and stenosis of bilateral carotid arteries: Secondary | ICD-10-CM | POA: Diagnosis not present

## 2020-07-22 NOTE — Assessment & Plan Note (Signed)
Carotid duplex today reveals a widely patent right carotid artery stent without recurrent stenosis.  The velocities fall in the 1 to 39% range on the left.  He had had a CT scan earlier this year that had suggested a higher degree of stenosis on the left, but on my review the stenosis did not appear to be more than 40 to 50% at the worst.  At this point, in a 86 year old who is asymptomatic I would not recommend any intervention and we will just follow this on an annual basis.  No change in medical regimen.

## 2020-07-22 NOTE — Patient Instructions (Signed)
Carotid Artery Disease  Carotid artery disease is the narrowing or blockage of one or both carotid arteries. This condition is also called carotid artery stenosis. The carotid arteries are the two main blood vessels on either side of the neck. They send blood to the brain, other parts of the head, and the neck.  This condition increases your risk for a stroke or a transient ischemic attack (TIA). A TIA is a "mini-stroke" that causes stroke-like symptoms that go away quickly. What are the causes? This condition is mainly caused by a narrowing and hardening of the carotid arteries. The carotid arteries can become narrow or clogged with a buildup of plaque. Plaque includes:  Fat.  Cholesterol.  Calcium.  Other substances. What increases the risk? The following factors may make you more likely to develop this condition:  Having certain medical conditions, such as: ? High cholesterol. ? High blood pressure. ? Diabetes. ? Obesity.  Smoking.  A family history of cardiovascular disease.  Not being active or lack of regular exercise.  Being male. Men have a higher risk of having arteries become narrow and harden earlier in life than women.  Old age. What are the signs or symptoms? This condition may not have any signs or symptoms until a stroke or TIA happens. In some cases, your doctor may be able to hear a whooshing sound. This can suggest a change in blood flow caused by plaque buildup. An eye exam can also help find signs of the condition. How is this treated? This condition may be treated with more than one treatment. Treatment options include:  Lifestyle changes, such as: ? Quitting smoking. ? Getting regular exercise, or getting exercise as told by your doctor. ? Eating a healthy diet. ? Managing stress. ? Keeping a healthy weight.  Medicines to control: ? Blood pressure. ? Cholesterol. ? Blood clotting.  Surgery. You may have: ? A surgery to remove the blockages in  the carotid arteries. ? A procedure in which a small mesh tube (stent) is used to widen the blocked carotid arteries. Follow these instructions at home: Eating and drinking Follow instructions about your diet from your doctor. It is important to follow a healthy diet.  Eat a diet that includes: ? A lot of fresh fruits and vegetables. ? Low-fat (lean) meats.  Avoid these foods: ? Foods that are high in fat. ? Foods that are high in salt (sodium). ? Foods that are fried. ? Foods that are processed. ? Foods that have few good nutrients (poor nutritional value).  Lifestyle   Keep a healthy weight.  Do exercises as told by your doctor to stay active. Each week, you should get one of the following: ? At least 150 minutes of exercise that raises your heart rate and makes you sweat (moderate-intensity exercise). ? At least 75 minutes of exercise that takes a lot of effort.  Do not use any products that contain nicotine or tobacco, such as cigarettes, e-cigarettes, and chewing tobacco. If you need help quitting, ask your doctor.  Do not drink alcohol if: ? Your doctor tells you not to drink. ? You are pregnant, may be pregnant, or are planning to become pregnant.  If you drink alcohol: ? Limit how much you use to:  0-1 drink a day for women.  0-2 drinks a day for men. ? Be aware of how much alcohol is in your drink. In the [place] one drink equals one 12 oz bottle of beer (355 mL), one 5   oz glass of wine (148 mL), or one 1 oz glass of hard liquor (44 mL).  Do not use drugs.  Manage your stress. Ask your doctor for tips on how to do this. General instructions  Take over-the-counter and prescription medicines only as told by your doctor.  Keep all follow-up visits as told by your doctor. This is important. Where to find more information  American Heart Association: www.heart.org Get help right away if:  You have any signs of a stroke. "BE FAST" is an easy way to remember the  main warning signs: ? B - Balance. Signs are dizziness, sudden trouble walking, or loss of balance. ? E - Eyes. Signs are trouble seeing or a change in how you see. ? F - Face. Signs are sudden weakness or loss of feeling of the face, or the face or eyelid drooping on one side. ? A - Arms. Signs are weakness or loss of feeling in an arm. This happens suddenly and usually on one side of the body. ? S - Speech. Signs are sudden trouble speaking, slurred speech, or trouble understanding what people say. ? T - Time. Time to call emergency services. Write down what time symptoms started.  You have other signs of a stroke, such as: ? A sudden, very bad headache with no known cause. ? Feeling like you may vomit (nausea). ? Vomiting. ? A seizure. These symptoms may be an emergency. Do not wait to see if the symptoms will go away. Get medical help right away. Call your local emergency services (911 in the [place] Do not drive yourself to the hospital. Summary  The carotid arteries are blood vessels on both sides of the neck.  If these arteries get smaller or get blocked, you are more likely to have a stroke or a mini-stroke.  This condition can be treated with lifestyle changes, medicines, surgery, or a blend of these treatments.  Get help right away if you have any signs of a stroke. "BE FAST" is an easy way to remember the main warning signs of stroke. This information is not intended to replace advice given to you by your health care provider. Make sure you discuss any questions you have with your health care provider. Document Revised: 06/11/2019 Document Reviewed: 06/11/2019 Elsevier Patient Education  2020 [employer]vier Inc[employer]  

## 2020-07-22 NOTE — Progress Notes (Signed)
MRN : [id number]  Justin Leonard is a 86 y.o. (1927-06-18) male who presents with chief complaint of  Chief Complaint  Patient presents with  . Follow-up    ultrasound follow up  .  History of Present Illness: Patient returns in follow-up of his carotid disease.  He reports he has been less dizzy and swimmy headed lately.  He and his son are not sure if this is not because he stopped one of his medications when he ran out of it a couple weeks ago.  This is prescribed by his cardiologist and I told him that certainly might be the case, but to check with them to make sure that he does not need to be on a replacement medicine or that medicine.  He has not had any focal neurologic deficits. Specifically, the patient denies amaurosis fugax, speech or swallowing difficulties, or arm or leg weakness or numbness. Carotid duplex today reveals a widely patent right carotid artery stent without recurrent stenosis.  The velocities fall in the 1 to 39% range on the left.  He had had a CT scan earlier this year that had suggested a higher degree of stenosis on the left, but on my review the stenosis did not appear to be more than 40 to 50% at the worst.   Current Outpatient Medications  Medication Sig Dispense Refill  . acetaminophen (TYLENOL) 650 MG CR tablet Take 650 mg by mouth 2 (two) times daily as needed for pain.     Marland Kitchen clopidogrel (PLAVIX) 75 MG tablet clopidogrel 75 mg tablet  TAKE 1 TABLET BY MOUTH ONCE DAILY    . ferrous sulfate 325 (65 FE) MG tablet Take 325 mg by mouth 2 (two) times daily.     Marland Kitchen FLUoxetine (PROZAC) 40 MG capsule Take 40 mg by mouth daily.     Marland Kitchen lisinopril (ZESTRIL) 5 MG tablet Take 5 mg by mouth daily.    Marland Kitchen losartan (COZAAR) 25 MG tablet     . lovastatin (MEVACOR) 20 MG tablet Take 20 mg by mouth at bedtime.    . metoprolol succinate (TOPROL-XL) 25 MG 24 hr tablet Take 12.5 mg by mouth daily.     . nitroGLYCERIN (NITROSTAT) 0.4 MG SL tablet Place 0.4 mg under the tongue  every 5 (five) minutes as needed for chest pain.    . OXYGEN Place 2 L into the nose Nightly.    . pantoprazole (PROTONIX) 40 MG tablet Take 40 mg by mouth daily.     . Polyethylene Glycol 3350 (MIRALAX PO) Take 17 g by mouth daily.    . ranolazine (RANEXA) 500 MG 12 hr tablet Take 500 mg by mouth 2 (two) times daily.     Marland Kitchen spironolactone (ALDACTONE) 25 MG tablet     . tamsulosin (FLOMAX) 0.4 MG CAPS capsule Take 1 capsule (0.4 mg total) by mouth daily. 90 capsule 3  . Tiotropium Bromide Monohydrate (SPIRIVA RESPIMAT) 2.5 MCG/ACT AERS Inhale 2 puffs into the lungs See admin instructions. Inhale 2 puffs 1 to 2 times a day.    . torsemide (DEMADEX) 20 MG tablet     . VENTOLIN HFA 108 (90 BASE) MCG/ACT inhaler Inhale 1-2 puffs into the lungs as needed for wheezing or shortness of breath. Reported on 12/16/2015    . furosemide (LASIX) 40 MG tablet furosemide 40 mg tablet (Patient not taking: Reported on 07/22/2020)    . hydrALAZINE (APRESOLINE) 25 MG tablet  (Patient not taking: Reported on 07/22/2020)    .  hydrALAZINE (APRESOLINE) 50 MG tablet Take 50 mg by mouth 2 (two) times daily.  (Patient not taking: Reported on 07/22/2020)    . metFORMIN (GLUCOPHAGE) 500 MG tablet Take 500 mg by mouth 2 (two) times daily.  (Patient not taking: Reported on 07/22/2020)     No current facility-administered medications for this visit.    Past Medical History:  Diagnosis Date  . Afib ([place])   . Anemia   . Arthritis   . Back pain   . CHF (congestive heart failure) ([place])   . COPD (chronic obstructive pulmonary disease) ([place])   . Coronary artery disease   . Deafness   . Depression   . Diabetes mellitus without complication ([place])   . Dizziness    when gets up  . GERD (gastroesophageal reflux disease)   . Hyperlipidemia   . Hypertension   . Melena   . Myocardial infarction ([place])    maybe a small one  . Obesity   . Oxygen decrease    WEARS HS,PRN  . Peripheral neuropathy   . Peripheral neuropathy     bil.hands and feet  . Skin cancer   . Umbilical hernia     Past Surgical History:  Procedure Laterality Date  . CARDIAC CATHETERIZATION Left 07/17/2015   Procedure: Right/Left Heart Cath and Coronary Angiography;  Surgeon: [name], MD;  Location: [facility];  Service: Cardiovascular;  Laterality: Left;  . CARDIAC CATHETERIZATION N/A 07/17/2015   Procedure: Coronary Stent Intervention;  Surgeon: [name], MD;  Location: [facility];  Service: Cardiovascular;  Laterality: N/A;  . CARDIAC CATHETERIZATION Right 05/10/2016   Procedure: Left Heart Cath and Coronary Angiography;  Surgeon: [name], MD;  Location: [facility];  Service: Cardiovascular;  Laterality: Right;  . CAROTID PTA/STENT INTERVENTION Right 07/12/2019   Procedure: CAROTID PTA/STENT INTERVENTION;  Surgeon: Algernon Huxley, MD;  Location: [facility];  Service: Cardiovascular;  Laterality: Right;  . CHOLECYSTECTOMY N/A 11/04/2016   Procedure: LAPAROSCOPIC CHOLECYSTECTOMY, POSSIBLE OPEN umbilical hernia repair;  Surgeon: [name], MD;  Location: [facility];  Service: General;  Laterality: N/A;  . COLONOSCOPY WITH PROPOFOL N/A 12/12/2015   Procedure: COLONOSCOPY WITH PROPOFOL;  Surgeon: [name], MD;  Location: [facility];  Service: Gastroenterology;  Laterality: N/A;  . CORONARY ANGIOPLASTY     stent 8/16  . CORONARY ARTERY BYPASS GRAFT  1994  . ESOPHAGOGASTRODUODENOSCOPY N/A 11/10/2015   Procedure: ESOPHAGOGASTRODUODENOSCOPY (EGD);  Surgeon: [name], MD;  Location: [facility];  Service: Endoscopy;  Laterality: N/A;  . EYE SURGERY    . gall bladder drain    . MOHS SURGERY       Social History   Tobacco Use  . Smoking status: Former Smoker    Packs/day: 3.00    Years: 30.00    Pack years: 90.00    Types: Cigarettes    Quit date: 04/16/1978    Years since quitting: 42.2  . Smokeless tobacco: Never Used  . Tobacco comment: Quit smoking 1979  Vaping Use  .  Vaping Use: Never used  Substance Use Topics  . Alcohol use: No    Alcohol/week: 0.0 standard drinks  . Drug use: No      Family History  Problem Relation Age of Onset  . Hypertension Father   . CVA Father   . Anemia Neg Hx   . Arrhythmia Neg Hx   . Asthma Neg Hx   . Clotting  disorder Neg Hx   . Fainting Neg Hx   . Heart attack Neg Hx   . Heart disease Neg Hx   . Heart failure Neg Hx   . Hyperlipidemia Neg Hx   . Prostate cancer Neg Hx   . Bladder Cancer Neg Hx   . Kidney cancer Neg Hx     Allergies  Allergen Reactions  . No Known Allergies     REVIEW OF SYSTEMS(Negative unless checked)  Constitutional: [] ???Weight loss[] ???Fever[] ???Chills Cardiac:[] ???Chest pain[] ???Chest pressure[] ???Palpitations [] ???Shortness of breath when laying flat [x] ???Shortness of breath at rest [x] ???Shortness of breath with exertion. Vascular: [] ???Pain in legs with walking[] ???Pain in legsat rest[] ???Pain in legs when laying flat [] ???Claudication [] ???Pain in feet when walking [] ???Pain in feet at rest [] ???Pain in feet when laying flat [] ???History of DVT [] ???Phlebitis [] ???Swelling in legs [] ???Varicose veins [] ???Non-healing ulcers Pulmonary: [] ???Uses home oxygen [] ???Productive cough[] ???Hemoptysis [] ???Wheeze [x] ???COPD [] ???Asthma Neurologic: [x] ???Dizziness [] ???Blackouts [] ???Seizures [] ???History of stroke [] ???History of TIA[] ???Aphasia [x] ???Temporary blindness[] ???Dysphagia [] ???Weaknessor numbness in arms [] ???Weakness or numbnessin legs Musculoskeletal: [x] ???Arthritis [] ???Joint swelling [x] ???Joint pain [] ???Low back pain Hematologic:[] ???Easy bruising[] ???Easy bleeding [] ???Hypercoagulable state [] ???Anemic [] ???Hepatitis Gastrointestinal:[] ???Blood in stool[] ???Vomiting blood[] ???Gastroesophageal reflux/heartburn[] ???Abdominal pain Genitourinary: [] ???Chronic kidney  disease [] ???Difficulturination [] ???Frequenturination [] ???Burning with urination[] ???Hematuria Skin: [] ???Rashes [] ???Ulcers [] ???Wounds Psychological: [] ???History of anxiety[] ???History of major depression.  Physical Examination  Vitals:   07/22/20 1549  BP: (!) 118/57  Pulse: 71  Resp: 16  Weight: 206 lb (93.4 kg)   Body mass index is 26.45 kg/m. Gen:  WD/WN, NAD Head: [place]/AT, No temporalis wasting. Ear/Nose/Throat: Hearing diminished, nares w/o erythema or drainage, trachea midline Eyes: Conjunctiva clear. Sclera non-icteric Neck: Supple.  No bruit  Pulmonary:  Good air movement, equal and clear to auscultation bilaterally.  Cardiac: Irregular Vascular:  Vessel Right Left  Radial Palpable Palpable       Musculoskeletal: M/S 5/5 throughout.  No deformity or atrophy.  Trace lower extremity edema.  Walks with a walker Neurologic: CN 2-12 intact. Sensation grossly intact in extremities.  Symmetrical.  Speech is fluent. Motor exam as listed above. Psychiatric: Judgment intact, Mood & affect appropriate for pt's clinical situation. Dermatologic: No rashes or ulcers noted.  No cellulitis or open wounds.      CBC Lab Results  Component Value Date   WBC 9.6 11/06/2019   HGB 10.1 (L) 11/06/2019   HCT 30.3 (L) 11/06/2019   MCV 93.8 11/06/2019   PLT 169 11/06/2019    BMET    Component Value Date/Time   NA 134 (L) 11/06/2019 1707   NA 139 06/24/2014 1501   K 3.8 11/06/2019 1707   K 4.6 06/24/2014 1501   CL 102 11/06/2019 1707   CL 106 06/24/2014 1501   CO2 23 11/06/2019 1707   CO2 26 06/24/2014 1501   GLUCOSE 145 (H) 11/06/2019 1707   GLUCOSE 118 (H) 06/24/2014 1501   BUN 14 11/06/2019 1707   BUN 22 (H) 06/24/2014 1501   CREATININE 1.09 11/06/2019 1707   CREATININE 1.45 (H) 06/24/2014 1501   CALCIUM 9.5 11/06/2019 1707   CALCIUM 9.1 06/24/2014 1501   GFRNONAA 59 (L) 11/06/2019 1707   GFRNONAA 44 (L) 06/24/2014 1501   GFRAA >60 11/06/2019  1707   GFRAA 51 (L) 06/24/2014 1501   CrCl cannot be calculated (Patient's most recent lab result is older than the maximum 21 days allowed.).  COAG Lab Results  Component Value Date   INR 0.97 01/08/2019   INR 0.97 10/25/2016   INR 1.16 09/21/2016    Radiology No results found.   Assessment/Plan  HTN (hypertension) blood pressure control important in reducing the progression of atherosclerotic disease. On appropriate oral medications.   Atrial fibrillation with RVR ([name]) Rate controlled and on anticoagulation  Diabetes ([name]) blood glucose control important in reducing the progression of atherosclerotic disease. Also, involved in wound healing. On appropriate medications.   HLD (hyperlipidemia) lipid control important in reducing the progression of atherosclerotic disease. Continue statin therapy  Carotid stenosis, bilateral Carotid duplex today reveals a widely patent right carotid artery stent without recurrent stenosis.  The velocities fall in the 1 to 39% range on the left.  He had had a CT scan earlier this year that had suggested a higher degree of stenosis on the left, but on my review the stenosis did not appear to be more than 40 to 50% at the worst.  At this point, in a 86 year old who is asymptomatic I would not recommend any intervention and we will just follow this on an annual basis.  No change in medical regimen.    Leotis Pain, MD  07/22/2020 4:20 PM    This note was created with Dragon medical transcription system.  Any errors from dictation are purely unintentional

## 2020-09-15 ENCOUNTER — Other Ambulatory Visit (INDEPENDENT_AMBULATORY_CARE_PROVIDER_SITE_OTHER): Payer: Self-pay | Admitting: Vascular Surgery

## 2020-10-05 ENCOUNTER — Other Ambulatory Visit: Payer: Self-pay | Admitting: Urology

## 2020-10-05 ENCOUNTER — Other Ambulatory Visit (INDEPENDENT_AMBULATORY_CARE_PROVIDER_SITE_OTHER): Payer: Self-pay | Admitting: Vascular Surgery

## 2020-10-10 ENCOUNTER — Other Ambulatory Visit: Payer: Self-pay | Admitting: Urology

## 2020-10-13 ENCOUNTER — Other Ambulatory Visit: Payer: Self-pay | Admitting: Urology

## 2020-10-13 MED ORDER — TAMSULOSIN HCL 0.4 MG PO CAPS: 0.4000 mg | ORAL_CAPSULE | Freq: Every day | ORAL | 0 refills | Status: DC

## 2020-10-13 NOTE — Progress Notes (Signed)
10/14/2020 2:21 PM   Justin Leonard [birth year] [id number]  Referring provider: [name], MD [address] [place],  [place] [address]  Chief Complaint  Patient presents with  . Benign Prostatic Hypertrophy    HPI: Urological history  - BPH with urinary retention  Justin Leonard is a 86 y.o. male who presents today for follow up with his son, Justin Leonard.    BPH WITH LUTS  (prostate and/or bladder) IPSS score: 11/2   PVR: 36 mL Previous score: 17/2   Previous PVR: 77 mL  Major complaint(s):  Frequency, difficulty urinating and weak urinary stream.  Denies any dysuria, hematuria or suprapubic pain.   Currently taking: tamsulosin 0.4 mg daily    Denies any recent fevers, chills, nausea or vomiting.   IPSS    Row Name 10/14/20 1300         International Prostate Symptom Score   How often have you had the sensation of not emptying your bladder? Less than half the time     How often have you had to urinate less than every two hours? Less than 1 in 5 times     How often have you found you stopped and started again several times when you urinated? Less than half the time     How often have you found it difficult to postpone urination? Not at All     How often have you had a weak urinary stream? About half the time     How often have you had to strain to start urination? Less than half the time     How many times did you typically get up at night to urinate? 1 Time     Total IPSS Score 11       Quality of Life due to urinary symptoms   If you were to spend the rest of your life with your urinary condition just the way it is now how would you feel about that? Mostly Satisfied            Score:  1-7 Mild 8-19 Moderate 20-35 Severe  PMH: Past Medical History:  Diagnosis Date  . Afib ([place])   . Anemia   . Arthritis   . Back pain   . CHF (congestive heart failure) ([place])   . COPD (chronic obstructive pulmonary disease) ([place])   . Coronary artery  disease   . Deafness   . Depression   . Diabetes mellitus without complication ([place])   . Dizziness    when gets up  . GERD (gastroesophageal reflux disease)   . Hyperlipidemia   . Hypertension   . Melena   . Myocardial infarction ([place])    maybe a small one  . Obesity   . Oxygen decrease    WEARS HS,PRN  . Peripheral neuropathy   . Peripheral neuropathy    bil.hands and feet  . Skin cancer   . Umbilical hernia     Surgical History: Past Surgical History:  Procedure Laterality Date  . CARDIAC CATHETERIZATION Left 07/17/2015   Procedure: Right/Left Heart Cath and Coronary Angiography;  Surgeon: [name], MD;  Location: [facility];  Service: Cardiovascular;  Laterality: Left;  . CARDIAC CATHETERIZATION N/A 07/17/2015   Procedure: Coronary Stent Intervention;  Surgeon: [name], MD;  Location: [facility];  Service: Cardiovascular;  Laterality: N/A;  . CARDIAC CATHETERIZATION Right 05/10/2016   Procedure: Left Heart Cath and Coronary Angiography;  Surgeon: Earlyne Iba  Richardson Dopp, MD;  Location: [facility];  Service: Cardiovascular;  Laterality: Right;  . CAROTID PTA/STENT INTERVENTION Right 07/12/2019   Procedure: CAROTID PTA/STENT INTERVENTION;  Surgeon: Algernon Huxley, MD;  Location: [facility];  Service: Cardiovascular;  Laterality: Right;  . CHOLECYSTECTOMY N/A 11/04/2016   Procedure: LAPAROSCOPIC CHOLECYSTECTOMY, POSSIBLE OPEN umbilical hernia repair;  Surgeon: [name], MD;  Location: [facility];  Service: General;  Laterality: N/A;  . COLONOSCOPY WITH PROPOFOL N/A 12/12/2015   Procedure: COLONOSCOPY WITH PROPOFOL;  Surgeon: [name], MD;  Location: [facility]OS[facility];  Service: Gastroenterology;  Laterality: N/A;  . CORONARY ANGIOPLASTY     stent 8/16  . CORONARY ARTERY BYPASS GRAFT  1994  . ESOPHAGOGASTRODUODENOSCOPY N/A 11/10/2015   Procedure: ESOPHAGOGASTRODUODENOSCOPY (EGD);  Surgeon: [name], MD;  [facility] END[facility];   Service: Endoscopy;  Laterality: N/A;  . EYE SURGERY    . gall bladder drain    . MOHS SURGERY      Home Medications:  Allergies as of 10/14/2020      Reactions   No Known Allergies       Medication List       Accurate as of October 14, 2020  2:21 PM. If you have any questions, ask your nurse or doctor.        STOP taking these medications   furosemide 40 MG tablet Commonly known as: LASIX Stopped by: Shelle Galdamez, PA-C   hydrALAZINE 25 MG tablet Commonly known as: APRESOLINE Stopped by: Stepen Prins, PA-C   hydrALAZINE 50 MG tablet Commonly known as: APRESOLINE Stopped by: Karel Turpen, PA-C   lisinopril 5 MG tablet Commonly known as: ZESTRIL Stopped by: Moishe Schellenberg, PA-C   metFORMIN 500 MG tablet Commonly known as: GLUCOPHAGE Stopped by: Bernadine Melecio, PA-C   ranolazine 500 MG 12 hr tablet Commonly known as: RANEXA Stopped by: Zara Council, PA-C     TAKE these medications   acetaminophen 650 MG CR tablet Commonly known as: TYLENOL Take 650 mg by mouth 2 (two) times daily as needed for pain.   clopidogrel 75 MG tablet Commonly known as: PLAVIX Take 1 tablet by mouth once daily   ferrous sulfate 325 (65 FE) MG tablet Take 325 mg by mouth 2 (two) times daily.   FLUoxetine 40 MG capsule Commonly known as: PROZAC Take 40 mg by mouth daily.   losartan 25 MG tablet Commonly known as: COZAAR   lovastatin 20 MG tablet Commonly known as: MEVACOR Take 20 mg by mouth at bedtime.   metoprolol succinate 25 MG 24 hr tablet Commonly known as: TOPROL-XL Take 12.5 mg by mouth daily.   MIRALAX PO Take 17 g by mouth daily.   nitroGLYCERIN 0.4 MG SL tablet Commonly known as: NITROSTAT Place 0.4 mg under the tongue every 5 (five) minutes as needed for chest pain.   OXYGEN Place 2 L into the nose Nightly.   pantoprazole 40 MG tablet Commonly known as: PROTONIX Take 40 mg by mouth daily.   Spiriva Respimat 2.5 MCG/ACT Aers Generic  drug: Tiotropium Bromide Monohydrate Inhale 2 puffs into the lungs See admin instructions. Inhale 2 puffs 1 to 2 times a day.   spironolactone 25 MG tablet Commonly known as: ALDACTONE   tamsulosin 0.4 MG Caps capsule Commonly known as: FLOMAX Take 1 capsule (0.4 mg total) by mouth daily.   torsemide 20 MG tablet Commonly known as: DEMADEX   Ventolin HFA 108 (90 Base) MCG/ACT inhaler Generic drug: albuterol  Inhale 1-2 puffs into the lungs as needed for wheezing or shortness of breath. Reported on 12/16/2015       Allergies:  Allergies  Allergen Reactions  . No Known Allergies     Family History: Family History  Problem Relation Age of Onset  . Hypertension Father   . CVA Father   . Anemia Neg Hx   . Arrhythmia Neg Hx   . Asthma Neg Hx   . Clotting disorder Neg Hx   . Fainting Neg Hx   . Heart attack Neg Hx   . Heart disease Neg Hx   . Heart failure Neg Hx   . Hyperlipidemia Neg Hx   . Prostate cancer Neg Hx   . Bladder Cancer Neg Hx   . Kidney cancer Neg Hx     Social History:  reports that he quit smoking about 42 years ago. His smoking use included cigarettes. He has a 90.00 pack-year smoking history. He has never used smokeless tobacco. He reports that he does not drink alcohol and does not use drugs.  ROS: Pertinent ROS in HPI  Physical Exam: BP (!) 114/46   Pulse 61   Ht '6\' 2"'  (1.88 m)   Wt 210 lb (95.3 kg)   BMI 26.96 kg/m   Constitutional:  Well nourished. Alert and oriented, No acute distress. HEENT: [place] AT, mask in place.  Trachea midline Cardiovascular: No clubbing, cyanosis, or edema. Respiratory: Normal respiratory effort, no increased work of breathing. Neurologic: Grossly intact, no focal deficits, moving all 4 extremities. Psychiatric: Normal mood and affect.  Laboratory Data: Lab Results  Component Value Date   WBC 9.6 11/06/2019   HGB 10.1 (L) 11/06/2019   HCT 30.3 (L) 11/06/2019   MCV 93.8 11/06/2019   PLT 169 11/06/2019    Lab  Results  Component Value Date   CREATININE 1.09 11/06/2019    Lab Results  Component Value Date   HGBA1C 5.4 11/05/2015    Lab Results  Component Value Date   TSH 3.455 11/05/2015       Component Value Date/Time   CHOL 102 12/05/2017 0512   HDL 38 (L) 12/05/2017 0512   CHOLHDL 2.7 12/05/2017 0512   VLDL 19 12/05/2017 0512   LDLCALC 45 12/05/2017 0512    Lab Results  Component Value Date   AST 55 (H) 01/08/2019   Lab Results  Component Value Date   ALT 90 (H) 01/08/2019   Specimen:  Blood - Blood, Venous  Ref Range & Units 3 mo ago Comments  Glucose 65 - 99 mg/dL 162High                                                Fasting reference interval                         For someone without known diabetes, a glucose    value >125 mg/dL indicates that they may have    diabetes and this should be confirmed with a    follow-up test.          BUN 7 - 25 mg/dL 32High        Creatinine 0.70 - 1.11 mg/dL 1.55High    For patients >89 years of age, the reference limit    for Creatinine is approximately 13% higher for people  identified as African-American.          eGFR Non-African > OR = 60 mL/min/1.21m 39Low        eGFR African > OR = 60 mL/min/1.752m45Low        BUN/Creatinine Ratio 6.00 - 22.00 (calc) 21        Sodium 135 - 146 mmol/L 138        Potassium 3.5 - 5.3 mmol/L 5.3        Chloride 98 - 110 mmol/L 107        Bicarbonate (CO2) 20 - 32 mmol/L 27        Calcium 8.6 - 10.3 mg/dL 10.0        Phosphorus 2.1 - 4.3 mg/dL 3.3        Albumin 3.6 - 5.1 g/dL 3.9        Resulting Agency  [id number]   Resulting Agency Comment  Performing Organization Information:   Site ID: [id number]   Name: [facility]   Address: [address] [id number] Director: JaCathleen Fearsessling Specimen Collected: 06/23/20  10:35 AM Last Resulted: 06/24/20 2:45 PM  [facility]  Result Received: 07/21/20 10:24 AM    Urinalysis    Component Value Date/Time   COLORURINE YELLOW (A) 01/08/2019 18[place]) 01/08/2019 1824   APPEARANCEUR Clear 12/16/2015 1125   LABSPEC 1.018 01/08/2019 1824   PHRoper.0 01/08/2019 [place]2/08/2019 18[place]GATIVE 01/08/2019 18[place]2/08/2019 1824   BILIRUBINUR Negative 12/16/2015 1125   KETONESUR 5 (A) 01/08/2019 1824   PROTEINUR 100 (A) 01/08/2019 1824   NITRITE NEGATIVE 01/08/2019 1824   LEUKOCYTESUR NEGATIVE 01/08/2019 1824   LEUKOCYTESUR Negative 12/16/2015 1125    I have reviewed the labs.   Pertinent Imaging: Results for YATAO, SATZMRN [id number] of 10/14/2020 14:17  Ref. Range 10/14/2020 13:55  Scan Result Unknown 36   Assessment & Plan:    1. BPH with LUTS IPSS score is 11/2, it is improving Continue conservative management, avoiding bladder irritants and timed voiding's Continue tamsulosin 0.4 mg daily - refills given RTC in 12 months for I PSS and PVR   Return in about 1 year (around 10/14/2021) for IPSS and PVR.  These notes generated with voice recognition software. I apologize for typographical errors.  SHZara CouncilPA-C  [facility] [address] [place] [id number]

## 2020-10-14 ENCOUNTER — Encounter: Payer: Self-pay | Admitting: Urology

## 2020-10-14 ENCOUNTER — Other Ambulatory Visit: Payer: Self-pay

## 2020-10-14 ENCOUNTER — Ambulatory Visit: Payer: Medicare PPO | Admitting: Urology

## 2020-10-14 VITALS — BP 114/46 | HR 61 | Ht 74.0 in | Wt 210.0 lb

## 2020-10-14 DIAGNOSIS — N138 Other obstructive and reflux uropathy: Secondary | ICD-10-CM | POA: Diagnosis not present

## 2020-10-14 DIAGNOSIS — N401 Enlarged prostate with lower urinary tract symptoms: Secondary | ICD-10-CM

## 2020-10-14 LAB — BLADDER SCAN AMB NON-IMAGING: Scan Result: 36

## 2020-10-14 MED ORDER — TAMSULOSIN HCL 0.4 MG PO CAPS: 0.4000 mg | ORAL_CAPSULE | Freq: Every day | ORAL | 3 refills | Status: DC

## 2021-01-08 ENCOUNTER — Other Ambulatory Visit (INDEPENDENT_AMBULATORY_CARE_PROVIDER_SITE_OTHER): Payer: Self-pay | Admitting: Vascular Surgery

## 2021-05-27 ENCOUNTER — Inpatient Hospital Stay: Payer: Medicare PPO

## 2021-05-27 ENCOUNTER — Emergency Department: Payer: Medicare PPO

## 2021-05-27 ENCOUNTER — Other Ambulatory Visit: Payer: Self-pay

## 2021-05-27 ENCOUNTER — Encounter: Payer: Self-pay | Admitting: Radiology

## 2021-05-27 ENCOUNTER — Inpatient Hospital Stay
Admission: EM | Admit: 2021-05-27 | Discharge: 2021-06-02 | DRG: 054 | Disposition: A | Discharge status: Hospice | Payer: Medicare PPO | Attending: Internal Medicine | Admitting: Internal Medicine

## 2021-05-27 DIAGNOSIS — J449 Chronic obstructive pulmonary disease, unspecified: Secondary | ICD-10-CM | POA: Diagnosis present

## 2021-05-27 DIAGNOSIS — I2721 Secondary pulmonary arterial hypertension: Secondary | ICD-10-CM | POA: Diagnosis present

## 2021-05-27 DIAGNOSIS — C7931 Secondary malignant neoplasm of brain: Secondary | ICD-10-CM | POA: Diagnosis not present

## 2021-05-27 DIAGNOSIS — Z8249 Family history of ischemic heart disease and other diseases of the circulatory system: Secondary | ICD-10-CM

## 2021-05-27 DIAGNOSIS — I2510 Atherosclerotic heart disease of native coronary artery without angina pectoris: Secondary | ICD-10-CM

## 2021-05-27 DIAGNOSIS — S06320A Contusion and laceration of left cerebrum without loss of consciousness, initial encounter: Secondary | ICD-10-CM

## 2021-05-27 DIAGNOSIS — E1142 Type 2 diabetes mellitus with diabetic polyneuropathy: Secondary | ICD-10-CM | POA: Diagnosis present

## 2021-05-27 DIAGNOSIS — Z85820 Personal history of malignant melanoma of skin: Secondary | ICD-10-CM

## 2021-05-27 DIAGNOSIS — Z66 Do not resuscitate: Secondary | ICD-10-CM | POA: Diagnosis not present

## 2021-05-27 DIAGNOSIS — Z20822 Contact with and (suspected) exposure to covid-19: Secondary | ICD-10-CM | POA: Diagnosis present

## 2021-05-27 DIAGNOSIS — I619 Nontraumatic intracerebral hemorrhage, unspecified: Secondary | ICD-10-CM | POA: Diagnosis present

## 2021-05-27 DIAGNOSIS — G8191 Hemiplegia, unspecified affecting right dominant side: Secondary | ICD-10-CM | POA: Diagnosis present

## 2021-05-27 DIAGNOSIS — N1831 Chronic kidney disease, stage 3a: Secondary | ICD-10-CM | POA: Diagnosis present

## 2021-05-27 DIAGNOSIS — R778 Other specified abnormalities of plasma proteins: Secondary | ICD-10-CM | POA: Diagnosis not present

## 2021-05-27 DIAGNOSIS — Z951 Presence of aortocoronary bypass graft: Secondary | ICD-10-CM

## 2021-05-27 DIAGNOSIS — G40909 Epilepsy, unspecified, not intractable, without status epilepticus: Secondary | ICD-10-CM | POA: Diagnosis present

## 2021-05-27 DIAGNOSIS — C7800 Secondary malignant neoplasm of unspecified lung: Secondary | ICD-10-CM | POA: Diagnosis present

## 2021-05-27 DIAGNOSIS — R911 Solitary pulmonary nodule: Secondary | ICD-10-CM | POA: Diagnosis present

## 2021-05-27 DIAGNOSIS — I130 Hypertensive heart and chronic kidney disease with heart failure and stage 1 through stage 4 chronic kidney disease, or unspecified chronic kidney disease: Secondary | ICD-10-CM | POA: Diagnosis present

## 2021-05-27 DIAGNOSIS — R531 Weakness: Secondary | ICD-10-CM | POA: Diagnosis not present

## 2021-05-27 DIAGNOSIS — N183 Chronic kidney disease, stage 3 unspecified: Secondary | ICD-10-CM | POA: Diagnosis present

## 2021-05-27 DIAGNOSIS — I482 Chronic atrial fibrillation, unspecified: Secondary | ICD-10-CM | POA: Diagnosis present

## 2021-05-27 DIAGNOSIS — Z7189 Other specified counseling: Secondary | ICD-10-CM

## 2021-05-27 DIAGNOSIS — K219 Gastro-esophageal reflux disease without esophagitis: Secondary | ICD-10-CM | POA: Diagnosis present

## 2021-05-27 DIAGNOSIS — E785 Hyperlipidemia, unspecified: Secondary | ICD-10-CM | POA: Diagnosis present

## 2021-05-27 DIAGNOSIS — G9341 Metabolic encephalopathy: Secondary | ICD-10-CM | POA: Diagnosis present

## 2021-05-27 DIAGNOSIS — I5032 Chronic diastolic (congestive) heart failure: Secondary | ICD-10-CM

## 2021-05-27 DIAGNOSIS — L8990 Pressure ulcer of unspecified site, unspecified stage: Secondary | ICD-10-CM | POA: Insufficient documentation

## 2021-05-27 DIAGNOSIS — Z9981 Dependence on supplemental oxygen: Secondary | ICD-10-CM

## 2021-05-27 DIAGNOSIS — Z515 Encounter for palliative care: Secondary | ICD-10-CM | POA: Diagnosis not present

## 2021-05-27 DIAGNOSIS — E1122 Type 2 diabetes mellitus with diabetic chronic kidney disease: Secondary | ICD-10-CM | POA: Diagnosis present

## 2021-05-27 DIAGNOSIS — D509 Iron deficiency anemia, unspecified: Secondary | ICD-10-CM | POA: Diagnosis present

## 2021-05-27 DIAGNOSIS — Z87891 Personal history of nicotine dependence: Secondary | ICD-10-CM

## 2021-05-27 DIAGNOSIS — R569 Unspecified convulsions: Secondary | ICD-10-CM | POA: Diagnosis not present

## 2021-05-27 DIAGNOSIS — M1990 Unspecified osteoarthritis, unspecified site: Secondary | ICD-10-CM | POA: Diagnosis present

## 2021-05-27 DIAGNOSIS — Z823 Family history of stroke: Secondary | ICD-10-CM

## 2021-05-27 DIAGNOSIS — H9190 Unspecified hearing loss, unspecified ear: Secondary | ICD-10-CM | POA: Diagnosis present

## 2021-05-27 DIAGNOSIS — Z7902 Long term (current) use of antithrombotics/antiplatelets: Secondary | ICD-10-CM

## 2021-05-27 DIAGNOSIS — R4701 Aphasia: Secondary | ICD-10-CM | POA: Diagnosis present

## 2021-05-27 DIAGNOSIS — E1165 Type 2 diabetes mellitus with hyperglycemia: Secondary | ICD-10-CM | POA: Diagnosis not present

## 2021-05-27 DIAGNOSIS — Z7901 Long term (current) use of anticoagulants: Secondary | ICD-10-CM

## 2021-05-27 DIAGNOSIS — I252 Old myocardial infarction: Secondary | ICD-10-CM

## 2021-05-27 DIAGNOSIS — I10 Essential (primary) hypertension: Secondary | ICD-10-CM | POA: Diagnosis present

## 2021-05-27 DIAGNOSIS — Z79899 Other long term (current) drug therapy: Secondary | ICD-10-CM

## 2021-05-27 LAB — CBC
HCT: 29.6 % — ABNORMAL LOW (ref 39.0–52.0)
Hemoglobin: 10.1 g/dL — ABNORMAL LOW (ref 13.0–17.0)
MCH: 31.3 pg (ref 26.0–34.0)
MCHC: 34.1 g/dL (ref 30.0–36.0)
MCV: 91.6 fL (ref 80.0–100.0)
Platelets: 166 10*3/uL (ref 150–400)
RBC: 3.23 MIL/uL — ABNORMAL LOW (ref 4.22–5.81)
RDW: 12.8 % (ref 11.5–15.5)
WBC: 8 10*3/uL (ref 4.0–10.5)
nRBC: 0 % (ref 0.0–0.2)

## 2021-05-27 LAB — URINALYSIS, COMPLETE (UACMP) WITH MICROSCOPIC
Bacteria, UA: NONE SEEN
Bilirubin Urine: NEGATIVE
Glucose, UA: 50 mg/dL — AB
Ketones, ur: NEGATIVE mg/dL
Leukocytes,Ua: NEGATIVE
Nitrite: NEGATIVE
Protein, ur: 100 mg/dL — AB
Specific Gravity, Urine: 1.018 (ref 1.005–1.030)
pH: 5 (ref 5.0–8.0)

## 2021-05-27 LAB — PROTIME-INR
INR: 1.1 (ref 0.8–1.2)
Prothrombin Time: 14.1 seconds (ref 11.4–15.2)

## 2021-05-27 LAB — BASIC METABOLIC PANEL
Anion gap: 8 (ref 5–15)
BUN: 23 mg/dL (ref 8–23)
CO2: 26 mmol/L (ref 22–32)
Calcium: 10.2 mg/dL (ref 8.9–10.3)
Chloride: 105 mmol/L (ref 98–111)
Creatinine, Ser: 1.2 mg/dL (ref 0.61–1.24)
GFR, Estimated: 57 mL/min — ABNORMAL LOW (ref >60)
Glucose, Bld: 208 mg/dL — ABNORMAL HIGH (ref 70–99)
Potassium: 3.9 mmol/L (ref 3.5–5.1)
Sodium: 139 mmol/L (ref 135–145)

## 2021-05-27 LAB — RESP PANEL BY RT-PCR (FLU A&B, COVID) ARPGX2
Influenza A by PCR: NEGATIVE
Influenza B by PCR: NEGATIVE
SARS Coronavirus 2 by RT PCR: NEGATIVE

## 2021-05-27 LAB — TROPONIN I (HIGH SENSITIVITY)
Troponin I (High Sensitivity): 20 ng/L — ABNORMAL HIGH (ref <18)
Troponin I (High Sensitivity): 17 ng/L (ref <18)

## 2021-05-27 LAB — BRAIN NATRIURETIC PEPTIDE: B Natriuretic Peptide: 149.1 pg/mL — ABNORMAL HIGH (ref 0.0–100.0)

## 2021-05-27 LAB — APTT: aPTT: 30 seconds (ref 24–36)

## 2021-05-27 LAB — MRSA NEXT GEN BY PCR, NASAL: MRSA by PCR Next Gen: NOT DETECTED

## 2021-05-27 LAB — GLUCOSE, CAPILLARY: Glucose-Capillary: 215 mg/dL — ABNORMAL HIGH (ref 70–99)

## 2021-05-27 MED ORDER — LABETALOL HCL 5 MG/ML IV SOLN
10.0000 mg | Freq: Once | INTRAVENOUS | Status: AC
  Administered 2021-05-27: 10 mg via INTRAVENOUS
  Filled 2021-05-27: qty 4

## 2021-05-27 MED ORDER — DEXAMETHASONE SODIUM PHOSPHATE 10 MG/ML IJ SOLN
10.0000 mg | Freq: Once | INTRAMUSCULAR | Status: AC
  Administered 2021-05-27: 10 mg via INTRAVENOUS
  Filled 2021-05-27: qty 1

## 2021-05-27 MED ORDER — ACETAMINOPHEN 650 MG RE SUPP: 650.0000 mg | Freq: Four times a day (QID) | RECTAL | Status: DC | PRN

## 2021-05-27 MED ORDER — FERROUS SULFATE 325 (65 FE) MG PO TABS
325.0000 mg | ORAL_TABLET | Freq: Two times a day (BID) | ORAL | Status: DC
  Administered 2021-05-27 – 2021-06-01 (×10): 325 mg via ORAL
  Filled 2021-05-27 (×11): qty 1

## 2021-05-27 MED ORDER — LEVETIRACETAM IN NACL 1000 MG/100ML IV SOLN
1000.0000 mg | Freq: Once | INTRAVENOUS | Status: AC
  Administered 2021-05-27: 1000 mg via INTRAVENOUS
  Filled 2021-05-27: qty 100

## 2021-05-27 MED ORDER — ACETAMINOPHEN 325 MG PO TABS
650.0000 mg | ORAL_TABLET | Freq: Four times a day (QID) | ORAL | Status: DC | PRN
  Administered 2021-05-30 – 2021-05-31 (×2): 650 mg via ORAL
  Filled 2021-05-27 (×2): qty 2

## 2021-05-27 MED ORDER — PRAVASTATIN SODIUM 20 MG PO TABS
20.0000 mg | ORAL_TABLET | Freq: Every day | ORAL | Status: DC
  Administered 2021-05-27 – 2021-05-31 (×5): 20 mg via ORAL
  Filled 2021-05-27 (×5): qty 1

## 2021-05-27 MED ORDER — CHLORHEXIDINE GLUCONATE CLOTH 2 % EX PADS
6.0000 | MEDICATED_PAD | Freq: Every day | CUTANEOUS | Status: DC
  Administered 2021-05-27 – 2021-05-31 (×5): 6 via TOPICAL

## 2021-05-27 MED ORDER — FLUOXETINE HCL 20 MG PO CAPS
40.0000 mg | ORAL_CAPSULE | Freq: Every day | ORAL | Status: DC
  Administered 2021-05-27 – 2021-06-01 (×6): 40 mg via ORAL
  Filled 2021-05-27 (×6): qty 2

## 2021-05-27 MED ORDER — METOPROLOL TARTRATE 5 MG/5ML IV SOLN: 12.5000 mg | Freq: Every day | INTRAVENOUS | Status: DC

## 2021-05-27 MED ORDER — ALBUTEROL SULFATE HFA 108 (90 BASE) MCG/ACT IN AERS: 1.0000 | INHALATION_SPRAY | RESPIRATORY_TRACT | Status: DC | PRN

## 2021-05-27 MED ORDER — DEXAMETHASONE SODIUM PHOSPHATE 4 MG/ML IJ SOLN
4.0000 mg | Freq: Two times a day (BID) | INTRAMUSCULAR | Status: DC
  Administered 2021-05-28: 4 mg via INTRAVENOUS
  Filled 2021-05-27: qty 1

## 2021-05-27 MED ORDER — LEVETIRACETAM IN NACL 500 MG/100ML IV SOLN
500.0000 mg | Freq: Two times a day (BID) | INTRAVENOUS | Status: DC
  Administered 2021-05-27 – 2021-05-29 (×4): 500 mg via INTRAVENOUS
  Filled 2021-05-27 (×8): qty 100

## 2021-05-27 MED ORDER — ALBUTEROL SULFATE (2.5 MG/3ML) 0.083% IN NEBU: 2.5000 mg | INHALATION_SOLUTION | RESPIRATORY_TRACT | Status: DC | PRN

## 2021-05-27 MED ORDER — TIOTROPIUM BROMIDE MONOHYDRATE 18 MCG IN CAPS
18.0000 ug | ORAL_CAPSULE | Freq: Every day | RESPIRATORY_TRACT | Status: DC
  Administered 2021-05-28 – 2021-06-01 (×5): 18 ug via RESPIRATORY_TRACT
  Filled 2021-05-27 (×2): qty 5

## 2021-05-27 MED ORDER — TIOTROPIUM BROMIDE MONOHYDRATE 2.5 MCG/ACT IN AERS: 2.0000 | INHALATION_SPRAY | RESPIRATORY_TRACT | Status: DC

## 2021-05-27 MED ORDER — IOHEXOL 300 MG/ML  SOLN
100.0000 mL | Freq: Once | INTRAMUSCULAR | Status: AC | PRN
  Administered 2021-05-27: 100 mL via INTRAVENOUS

## 2021-05-27 MED ORDER — GADOBUTROL 1 MMOL/ML IV SOLN
9.0000 mL | Freq: Once | INTRAVENOUS | Status: AC | PRN
  Administered 2021-05-27: 9 mL via INTRAVENOUS
  Filled 2021-05-27: qty 10

## 2021-05-27 MED ORDER — TAMSULOSIN HCL 0.4 MG PO CAPS
0.4000 mg | ORAL_CAPSULE | Freq: Every day | ORAL | Status: DC
  Administered 2021-05-27 – 2021-06-01 (×6): 0.4 mg via ORAL
  Filled 2021-05-27 (×6): qty 1

## 2021-05-27 MED ORDER — SODIUM CHLORIDE 0.9 % IV SOLN: INTRAVENOUS | Status: DC

## 2021-05-27 MED ORDER — METOPROLOL TARTRATE 5 MG/5ML IV SOLN
2.5000 mg | Freq: Two times a day (BID) | INTRAVENOUS | Status: DC
  Administered 2021-05-27 – 2021-05-28 (×2): 2.5 mg via INTRAVENOUS
  Filled 2021-05-27 (×2): qty 5

## 2021-05-27 MED ORDER — NITROGLYCERIN 0.4 MG SL SUBL: 0.4000 mg | SUBLINGUAL_TABLET | SUBLINGUAL | Status: DC | PRN

## 2021-05-27 MED ORDER — LOSARTAN POTASSIUM 25 MG PO TABS
25.0000 mg | ORAL_TABLET | Freq: Every day | ORAL | Status: DC
  Administered 2021-05-27 – 2021-06-01 (×6): 25 mg via ORAL
  Filled 2021-05-27 (×6): qty 1

## 2021-05-27 MED ORDER — METOPROLOL SUCCINATE ER 25 MG PO TB24
12.5000 mg | ORAL_TABLET | Freq: Every day | ORAL | Status: DC
  Administered 2021-05-27 – 2021-05-31 (×5): 12.5 mg via ORAL
  Filled 2021-05-27 (×4): qty 0.5
  Filled 2021-05-27: qty 1

## 2021-05-27 MED ORDER — POLYETHYLENE GLYCOL 3350 17 G PO PACK: 17.0000 g | PACK | Freq: Every day | ORAL | Status: DC | PRN

## 2021-05-27 MED ORDER — PANTOPRAZOLE SODIUM 40 MG PO TBEC
40.0000 mg | DELAYED_RELEASE_TABLET | Freq: Every day | ORAL | Status: DC
  Administered 2021-05-27 – 2021-06-01 (×6): 40 mg via ORAL
  Filled 2021-05-27 (×6): qty 1

## 2021-05-27 MED ORDER — HYDRALAZINE HCL 20 MG/ML IJ SOLN: 5.0000 mg | INTRAMUSCULAR | Status: DC | PRN

## 2021-05-27 MED ORDER — LORAZEPAM 2 MG/ML IJ SOLN
1.0000 mg | INTRAMUSCULAR | Status: DC | PRN
  Administered 2021-05-27: 1 mg via INTRAVENOUS
  Filled 2021-05-27: qty 1

## 2021-05-27 MED ORDER — ONDANSETRON HCL 4 MG/2ML IJ SOLN: 4.0000 mg | Freq: Three times a day (TID) | INTRAMUSCULAR | Status: DC | PRN

## 2021-05-27 NOTE — ED Notes (Signed)
Patient taken to MRI

## 2021-05-27 NOTE — H&P (Signed)
History and Physical    Justin Leonard VVO:[id number] DOB: 1927/06/21 DOA: 05/27/2021  Referring MD/NP/PA:   PCP: [name], MD   Patient coming from:  The patient is coming from home.          Chief Complaint: AMS and possible seizure  HPI: Justin Leonard is a 87 y.o. 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  Per his son, pt is normally active, alert and oriented. This morning, patient called his son and told his son that he had seizure last night. Pt is found to be confused per his son. He moves all extremities.  No facial droop or slurred speech.  Not sure if patient had fall or not. When I saw pt in ED, he is confused, recognizes his son and knows that he is in hospital, but is not oriented to time. He has increased urinary frequency lately. Pt is taking torsemide and spironolactone for dCHF.  Patient does not seem to have abdominal pain or chest pain.  No active nausea, vomiting, diarrhea, cough or respiratory distress noted.  ED Course: pt was found to have WBC 8.0, INR 1.1, PTT 30, troponin level 20--> 17, negative COVID PCR, renal function close to baseline, temperature normal, blood pressure 181/94, 156/76, heart rate 89, RR 18, oxygen saturation 91-96% on room air.  Chest x-ray showed CABG, cardiomegaly and interstitial prominence, with multiple bilateral lung nodules.  CT of C-spine is negative for acute issues.  CT of head showed ICH and underlying metastasized disease cannot be ruled out. Pt is admitted to stepdown as inpatient.  Dr. Izora Ribas of neurosurgery is consulted.  CT  head: 1. Acute parenchymal hemorrhages (3 total) within the bilateral frontal lobes with surrounding edema. The largest hemorrhage is present within the medial mid left frontal lobe, measuring 2.5 x 3.0 cm. Underlying lesions (i.e. metastases) at these sites cannot be excluded, and a contrast-enhanced brain MRI is recommended for further evaluation. 2. Trace subarachnoid hemorrhage within the posterior right sylvian fissure. 3. Chronic left basal ganglia lacunar infarct. 4. Background generalized parenchymal atrophy and cerebral white matter chronic small vessel ischemic disease.  Review of Systems: Could not reviewed accurately due to altered mental status  Allergy:  Allergies  Allergen Reactions   No Known Allergies     Past Medical History:  Diagnosis Date   Afib ([name])    Anemia    Arthritis    Back pain    CHF (congestive heart failure) ([name])    COPD (chronic obstructive pulmonary disease) ([name])    Coronary artery disease    Deafness    Depression    Diabetes mellitus without complication ([name])    Dizziness    when gets up   GERD (gastroesophageal reflux disease)    Hyperlipidemia    Hypertension    Melena    Myocardial infarction ([place])    maybe a small one   Obesity    Oxygen decrease    WEARS HS,PRN   Peripheral neuropathy    Peripheral neuropathy    bil.hands and feet   Skin cancer    Umbilical hernia     Past Surgical History:  Procedure Laterality Date   CARDIAC  CATHETERIZATION Left 07/17/2015   Procedure: Right/Left Heart Cath and Coronary Angiography;  Surgeon: [name], MD;  Location: [facility];  Service: Cardiovascular;  Laterality: Left;   CARDIAC CATHETERIZATION N/A 07/17/2015   Procedure: Coronary Stent Intervention;  Surgeon: [name], MD;  Location: [facility];  Service: Cardiovascular;  Laterality: N/A;   CARDIAC CATHETERIZATION Right 05/10/2016   Procedure: Left Heart Cath and Coronary Angiography;   Surgeon: [name], MD;  Location: [facility];  Service: Cardiovascular;  Laterality: Right;   CAROTID PTA/STENT INTERVENTION Right 07/12/2019   Procedure: CAROTID PTA/STENT INTERVENTION;  Surgeon: Algernon Huxley, MD;  Location: [facility];  Service: Cardiovascular;  Laterality: Right;   CHOLECYSTECTOMY N/A 11/04/2016   Procedure: LAPAROSCOPIC CHOLECYSTECTOMY, POSSIBLE OPEN umbilical hernia repair;  Surgeon: [name], MD;  Location: [facility];  Service: General;  Laterality: N/A;   COLONOSCOPY WITH PROPOFOL N/A 12/12/2015   Procedure: COLONOSCOPY WITH PROPOFOL;  Surgeon: [name], MD;  Location: [facility];  Service: Gastroenterology;  Laterality: N/A;   CORONARY ANGIOPLASTY     stent 8/16   CORONARY ARTERY BYPASS GRAFT  1994   ESOPHAGOGASTRODUODENOSCOPY N/A 11/10/2015   Procedure: ESOPHAGOGASTRODUODENOSCOPY (EGD);  Surgeon: [name], MD;  Location: [facility];  Service: Endoscopy;  Laterality: N/A;   EYE SURGERY     gall bladder drain     MOHS SURGERY      Social History:  reports that he quit smoking about 43 years ago. His smoking use included cigarettes. He has a 90.00 pack-year smoking history. He has never used smokeless tobacco. He reports that he does not drink alcohol and does not use drugs.  Family History:  Family History  Problem Relation Age of Onset   Hypertension Father    CVA Father    Anemia Neg Hx    Arrhythmia Neg Hx    Asthma Neg Hx    Clotting disorder Neg Hx    Fainting Neg Hx    Heart attack Neg Hx    Heart disease Neg Hx    Heart failure Neg Hx    Hyperlipidemia Neg Hx    Prostate cancer Neg Hx    Bladder Cancer Neg Hx    Kidney cancer Neg Hx      Prior to Admission medications   Medication Sig Start Date End Date Taking? Authorizing Provider  acetaminophen (TYLENOL) 650 MG CR tablet Take 650 mg by mouth 2 (two) times daily as needed for pain.    Yes [provider]  ELIQUIS 2.5 MG TABS tablet Take 2.5 mg by  mouth 2 (two) times daily. 02/13/21  Yes [provider]  ferrous sulfate 325 (65 FE) MG tablet Take 325 mg by mouth 2 (two) times daily.    Yes [provider]  FLUoxetine (PROZAC) 40 MG capsule Take 40 mg by mouth daily.  05/24/17  Yes [provider]  losartan (COZAAR) 25 MG tablet  03/23/20  Yes [provider]  lovastatin (MEVACOR) 20 MG tablet Take 20 mg by mouth at bedtime.   Yes [provider]  metoprolol succinate (TOPROL-XL) 25 MG 24 hr tablet Take 12.5 mg by mouth daily.    Yes [provider]  pantoprazole (PROTONIX) 40 MG tablet Take 40 mg by mouth daily.  08/26/15  Yes [provider]  Polyethylene Glycol 3350 (MIRALAX PO) Take 17 g by mouth daily.   Yes [provider]  spironolactone (ALDACTONE)  25 MG tablet  03/28/20  Yes [provider]  tamsulosin (FLOMAX) 0.4 MG CAPS capsule Take 1 capsule (0.4 mg total) by mouth daily. 10/14/20  Yes McGowan, [name], PA-C  Tiotropium Bromide Monohydrate 2.5 MCG/ACT AERS Inhale 2 puffs into the lungs See admin instructions. Inhale 2 puffs 1 to 2 times a day.   Yes [provider]  torsemide (DEMADEX) 20 MG tablet  03/28/20  Yes [provider]  VENTOLIN HFA 108 (90 BASE) MCG/ACT inhaler Inhale 1-2 puffs into the lungs as needed for wheezing or shortness of breath. Reported on 12/16/2015 06/24/15  Yes [provider]  clopidogrel (PLAVIX) 75 MG tablet Take 1 tablet by mouth once daily Patient not taking: No sig reported 01/09/21   Algernon Huxley, MD  nitroGLYCERIN (NITROSTAT) 0.4 MG SL tablet Place 0.4 mg under the tongue every 5 (five) minutes as needed for chest pain.    [provider]  OXYGEN Place 2 L into the nose Nightly.    [provider]    Physical Exam: Vitals:   05/27/21 1345 05/27/21 1400 05/27/21 1430 05/27/21 1530  BP: (!) 156/76 (!) 177/91 (!) 146/62 (!) 142/58  Pulse: 89 (!) 121 66 76  Resp: 16 (!) 22 (!) 6  11  Temp:      TempSrc:      SpO2: 94% 92% 92% 95%  Weight:      Height:       General: Not in acute distress HEENT:       Eyes: PERRL, EOMI, no scleral icterus.       ENT: No discharge from the ears and nose       Neck: No JVD, no bruit, no mass felt. Heme: No neck lymph node enlargement. Cardiac: S1/S2, RRR, No murmurs, No gallops or rubs. Respiratory: No rales, wheezing, rhonchi or rubs. GI: Soft, nondistended, nontender, no organomegaly, BS present. GU: No hematuria Ext: No pitting leg edema bilaterally. 1+DP/PT pulse bilaterally. Musculoskeletal: No joint deformities, No joint redness or warmth, no limitation of ROM in spin. Skin: No rashes.  Neuro: confused, recognizes his son and knows that he is in hospital, but is not oriented to time, cranial nerves II-XII grossly intact, moves all extremities normally.  Psych: Patient is not psychotic, no suicidal or hemocidal ideation.  Labs on Admission: I have personally reviewed following labs and imaging studies  CBC: Recent Labs  Lab 05/27/21 0906  WBC 8.0  HGB 10.1*  HCT 29.6*  MCV 91.6  PLT 403   Basic Metabolic Panel: Recent Labs  Lab 05/27/21 0906  NA 139  K 3.9  CL 105  CO2 26  GLUCOSE 208*  BUN 23  CREATININE 1.20  CALCIUM 10.2   GFR: Estimated Creatinine Clearance: 46.6 mL/min (by C-G formula based on SCr of 1.2 mg/dL). Liver Function Tests: No results for input(s): AST, ALT, ALKPHOS, BILITOT, PROT, ALBUMIN in the last 168 hours. No results for input(s): LIPASE, AMYLASE in the last 168 hours. No results for input(s): AMMONIA in the last 168 hours. Coagulation Profile: Recent Labs  Lab 05/27/21 1326  INR 1.1   Cardiac Enzymes: No results for input(s): CKTOTAL, CKMB, CKMBINDEX, TROPONINI in the last 168 hours. BNP (last 3 results) No results for input(s): PROBNP in the last 8760 hours. HbA1C: No results for input(s): HGBA1C in the last 72 hours. CBG: No results for input(s): GLUCAP in the last  168 hours. Lipid Profile: No results for input(s): CHOL, HDL, LDLCALC, TRIG, CHOLHDL, LDLDIRECT  in the last 72 hours. Thyroid Function Tests: No results for input(s): TSH, T4TOTAL, FREET4, T3FREE, THYROIDAB in the last 72 hours. Anemia Panel: No results for input(s): VITAMINB12, FOLATE, FERRITIN, TIBC, IRON, RETICCTPCT in the last 72 hours. Urine analysis:    Component Value Date/Time   COLORURINE YELLOW (A) 05/27/2021 0906   APPEARANCEUR CLEAR (A) 05/27/2021 0906   APPEARANCEUR Clear 12/16/2015 1125   LABSPEC 1.018 05/27/2021 0906   PHURINE 5.0 05/27/2021 0906   GLUCOSEU 50 (A) 05/27/2021 0906   HGBUR SMALL (A) 05/27/2021 0906   BILIRUBINUR NEGATIVE 05/27/2021 0906   BILIRUBINUR Negative 12/16/2015 1125   KETONESUR NEGATIVE 05/27/2021 0906   PROTEINUR 100 (A) 05/27/2021 0906   NITRITE NEGATIVE 05/27/2021 0906   LEUKOCYTESUR NEGATIVE 05/27/2021 0906   Sepsis Labs: @LABRCNTIP (procalcitonin:4,lacticidven:4) ) Recent Results (from the past 240 hour(s))  Resp Panel by RT-PCR (Flu A&B, Covid) Nasopharyngeal Swab     Status: None   Collection Time: 05/27/21  2:10 PM   Specimen: Nasopharyngeal Swab; Nasopharyngeal(NP) swabs in vial transport medium  Result Value Ref Range Status   SARS Coronavirus 2 by RT PCR NEGATIVE NEGATIVE Final    Comment: (NOTE) SARS-CoV-2 target nucleic acids are NOT DETECTED.  The SARS-CoV-2 RNA is generally detectable in upper respiratory specimens during the acute phase of infection. The lowest concentration of SARS-CoV-2 viral copies this assay can detect is 138 copies/mL. A negative result does not preclude SARS-Cov-2 infection and should not be used as the sole basis for treatment or other patient management decisions. A negative result may occur with  improper specimen collection/handling, submission of specimen other than nasopharyngeal swab, presence of viral mutation(s) within the areas targeted by this assay, and inadequate number of  viral copies(<138 copies/mL). A negative result must be combined with clinical observations, patient history, and epidemiological information. The expected result is Negative.  Fact Sheet for Patients:  EntrepreneurPulse.com.au  Fact Sheet for Healthcare Providers:  IncredibleEmployment.be  This test is no t yet approved or cleared by the [place] FDA and  has been authorized for detection and/or diagnosis of SARS-CoV-2 by FDA under an Emergency Use Authorization (EUA). This EUA will remain  in effect (meaning this test can be used) for the duration of the COVID-19 declaration under Section 564(b)(1) of the Act, 21 U.S.C.section 360bbb-3(b)(1), unless the authorization is terminated  or revoked sooner.       Influenza A by PCR NEGATIVE NEGATIVE Final   Influenza B by PCR NEGATIVE NEGATIVE Final    Comment: (NOTE) The Xpert Xpress SARS-CoV-2/FLU/RSV plus assay is intended as an aid in the diagnosis of influenza from Nasopharyngeal swab specimens and should not be used as a sole basis for treatment. Nasal washings and aspirates are unacceptable for Xpert Xpress SARS-CoV-2/FLU/RSV testing.  Fact Sheet for Patients: EntrepreneurPulse.com.au  Fact Sheet for Healthcare Providers: IncredibleEmployment.be  This test is not yet approved or cleared by the [place] FDA and has been authorized for detection and/or diagnosis of SARS-CoV-2 by FDA under an Emergency Use Authorization (EUA). This EUA will remain in effect (meaning this test can be used) for the duration of the COVID-19 declaration under Section 564(b)(1) of the Act, 21 U.S.C. section 360bbb-3(b)(1), unless the authorization is terminated or revoked.  Performed at [facility], [address], [place], [place] [address]      Radiological Exams on Admission: CT Head Wo Contrast  Result Date: 05/27/2021 CLINICAL DATA:  Delirium.  EXAM: CT HEAD WITHOUT CONTRAST CT CERVICAL SPINE WITHOUT CONTRAST TECHNIQUE:  Multidetector CT imaging of the head and cervical spine was performed following the standard protocol without intravenous contrast. Multiplanar CT image reconstructions of the cervical spine were also generated. COMPARISON:  No pertinent prior exams available for comparison. FINDINGS: CT HEAD FINDINGS Brain: Moderate generalized cerebral atrophy. Comparatively mild cerebellar atrophy. 2.5 x 2.4 x 3.0 cm acute parenchymal hemorrhage within the medial mid left frontal lobe with mild to moderate surrounding edema. 1 cm acute parenchymal hemorrhage within the medial mid right frontal lobe with mild surrounding edema (series 2, image 22). 1.0 x 1.4 x 1.0 within the lateral right frontal lobe with mild surrounding edema (series 2, image 17). Trace acute subarachnoid hemorrhage along the posterior aspect of the right sylvian fissure (series 2, image 14). Chronic lacunar infarct within the left caudate nucleus. Background mild ill-defined hypoattenuation within the cerebral white matter, nonspecific but compatible with chronic small vessel ischemic disease. No demarcated cortical infarct. No hydrocephalus or midline shift. Vascular: No hyperdense vessel.  Atherosclerotic calcifications. Skull: Normal. Negative for fracture or focal lesion. Sinuses/Orbits: Visualized orbits show no acute finding. No significant paranasal sinus disease. CT CERVICAL SPINE FINDINGS Alignment: Cervical dextrocurvature. Straightening of the expected cervical lordosis. 2 mm C3-C4 grade 1 anterolisthesis. Fused C5-C6 grade 1 retrolisthesis. Skull base and vertebrae: The basion-dental and atlanto-dental intervals are maintained.No evidence of acute fracture to the cervical spine. Fusion across the disc space at C2-C3. Bilateral C2-C3 facet joint ankylosis also present. Soft tissues and spinal canal: No prevertebral fluid or swelling. No visible canal hematoma. Right carotid  artery stent. Disc levels: Cervical spondylosis with multilevel disc space narrowing, disc bulges, posterior disc osteophytes, endplate spurring, uncovertebral hypertrophy and facet arthrosis. Multilevel spinal canal stenosis. Most notably, there is apparent moderate spinal canal stenosis at C5-C6. Multilevel ventral osteophytes. Most notably, a bridging ventral osteophyte is present at C5-C6. Upper chest: The upper chest is excluded from the field of view. These results were called by telephone at the time of interpretation on 05/27/2021 at 12:52 pm to provider Duffy Bruce , who verbally acknowledged these results. IMPRESSION: CT head: 1. Acute parenchymal hemorrhages (3 total) within the bilateral frontal lobes with surrounding edema. The largest hemorrhage is present within the medial mid left frontal lobe, measuring 2.5 x 3.0 cm. Underlying lesions (i.e. metastases) at these sites cannot be excluded, and a contrast-enhanced brain MRI is recommended for further evaluation. 2. Trace subarachnoid hemorrhage within the posterior right sylvian fissure. 3. Chronic left basal ganglia lacunar infarct. 4. Background generalized parenchymal atrophy and cerebral white matter chronic small vessel ischemic disease. CT cervical spine: 1. No evidence of acute fracture to the cervical spine. 2. 2 mm C3-C4 grade 1 anterolisthesis. 3. Trace fused C5-C6 grade 1 retrolisthesis. 4. Cervical spondylosis, as described. 5. Fusion across the C2-C3 disc space and posterior elements. Electronically Signed   By: Kellie Simmering DO   On: 05/27/2021 12:59   CT CERVICAL SPINE WO CONTRAST  Result Date: 05/27/2021 CLINICAL DATA:  Delirium. EXAM: CT HEAD WITHOUT CONTRAST CT CERVICAL SPINE WITHOUT CONTRAST TECHNIQUE: Multidetector CT imaging of the head and cervical spine was performed following the standard protocol without intravenous contrast. Multiplanar CT image reconstructions of the cervical spine were also generated. COMPARISON:  No  pertinent prior exams available for comparison. FINDINGS: CT HEAD FINDINGS Brain: Moderate generalized cerebral atrophy. Comparatively mild cerebellar atrophy. 2.5 x 2.4 x 3.0 cm acute parenchymal hemorrhage within the medial mid left frontal lobe with mild to moderate surrounding edema. 1 cm acute parenchymal hemorrhage  within the medial mid right frontal lobe with mild surrounding edema (series 2, image 22). 1.0 x 1.4 x 1.0 within the lateral right frontal lobe with mild surrounding edema (series 2, image 17). Trace acute subarachnoid hemorrhage along the posterior aspect of the right sylvian fissure (series 2, image 14). Chronic lacunar infarct within the left caudate nucleus. Background mild ill-defined hypoattenuation within the cerebral white matter, nonspecific but compatible with chronic small vessel ischemic disease. No demarcated cortical infarct. No hydrocephalus or midline shift. Vascular: No hyperdense vessel.  Atherosclerotic calcifications. Skull: Normal. Negative for fracture or focal lesion. Sinuses/Orbits: Visualized orbits show no acute finding. No significant paranasal sinus disease. CT CERVICAL SPINE FINDINGS Alignment: Cervical dextrocurvature. Straightening of the expected cervical lordosis. 2 mm C3-C4 grade 1 anterolisthesis. Fused C5-C6 grade 1 retrolisthesis. Skull base and vertebrae: The basion-dental and atlanto-dental intervals are maintained.No evidence of acute fracture to the cervical spine. Fusion across the disc space at C2-C3. Bilateral C2-C3 facet joint ankylosis also present. Soft tissues and spinal canal: No prevertebral fluid or swelling. No visible canal hematoma. Right carotid artery stent. Disc levels: Cervical spondylosis with multilevel disc space narrowing, disc bulges, posterior disc osteophytes, endplate spurring, uncovertebral hypertrophy and facet arthrosis. Multilevel spinal canal stenosis. Most notably, there is apparent moderate spinal canal stenosis at C5-C6.  Multilevel ventral osteophytes. Most notably, a bridging ventral osteophyte is present at C5-C6. Upper chest: The upper chest is excluded from the field of view. These results were called by telephone at the time of interpretation on 05/27/2021 at 12:52 pm to provider Duffy Bruce , who verbally acknowledged these results. IMPRESSION: CT head: 1. Acute parenchymal hemorrhages (3 total) within the bilateral frontal lobes with surrounding edema. The largest hemorrhage is present within the medial mid left frontal lobe, measuring 2.5 x 3.0 cm. Underlying lesions (i.e. metastases) at these sites cannot be excluded, and a contrast-enhanced brain MRI is recommended for further evaluation. 2. Trace subarachnoid hemorrhage within the posterior right sylvian fissure. 3. Chronic left basal ganglia lacunar infarct. 4. Background generalized parenchymal atrophy and cerebral white matter chronic small vessel ischemic disease. CT cervical spine: 1. No evidence of acute fracture to the cervical spine. 2. 2 mm C3-C4 grade 1 anterolisthesis. 3. Trace fused C5-C6 grade 1 retrolisthesis. 4. Cervical spondylosis, as described. 5. Fusion across the C2-C3 disc space and posterior elements. Electronically Signed   By: Kellie Simmering DO   On: 05/27/2021 12:59   DG Chest Portable 1 View  Result Date: 05/27/2021 CLINICAL DATA:  Cough.  Weakness. EXAM: PORTABLE CHEST 1 VIEW COMPARISON:  12/05/2017. FINDINGS: Prior CABG. Cardiomegaly. Bilateral interstitial prominence. Interstitial edema and or pneumonitis cannot be excluded. Bilateral pulmonary nodular densities are noted. Chest CT can be obtained for further evaluation. No pleural effusion or pneumothorax. Biapical pleural thickening consistent scarring. No acute bony abnormality. IMPRESSION: 1. Prior CABG. Cardiomegaly. Bilateral interstitial prominence consistent with interstitial edema. Pneumonitis cannot be excluded. 2. Multiple bilateral pulmonary nodules for which chest CT can be  obtained for further evaluation. These pulmonary nodules could be infectious or secondary to metastatic disease. Electronically Signed   By: Marcello Moores  Register   On: 05/27/2021 12:37     EKG: I have personally reviewed.  Atrial fibrillation, QTC 472, right bundle blockage, early R wave progression   Assessment/Plan Principal Problem:   ICH (intracerebral hemorrhage) ([name]) Active Problems:   Chronic diastolic heart failure ([name])   HTN (hypertension)   COPD (chronic obstructive pulmonary disease) ([name])   Elevated troponin  HLD (hyperlipidemia)   PAH (pulmonary artery hypertension) ([name])   Symptomatic carotid artery stenosis   CAD (coronary artery disease), native coronary artery   Seizure ([name])   Lung nodule   CKD (chronic kidney disease), stage IIIa   Iron deficiency anemia   Atrial fibrillation, chronic ([name])   Acute metabolic encephalopathy   ICH (intracerebral hemorrhage) and acute metabolic encephalopathy: CT-head showed acute parenchymal hemorrhages and trace subarachnoid hemorrhage. Metastatic disease cannot be rulled out. Pt has hx of melanoma.  Dr. Izora Ribas of neurosurgeon recommended MRI of brain and CT scan of chest/abd/pelvis to evaluate for possible metastasized disease.  -will admit to SDU as inpt -Frequent neurochecks -10 mg of Decadron was given, will continue 4 mg bid - Keppra 1000 mg, then 500 mg bid -f/u MRI-brain which is ordered by Dr. Izora Ribas -f/u CT-chest/abd/pelvis -hold Eliquis (per EDP, Dr. Cari Caraway does not think we need to reverse Eliquis effect) -pt is not taking ASA or plavix  Possible seizure ([place]) -Seizure precaution -When necessary Ativan for seizure -on keppra as above  Chronic diastolic heart failure and Hx of pulm HT N: 2D echo on 01/09/2019 showed EF of 60-65% with grade 1 diastolic dysfunction.  Patient does not have leg edema no respiratory distress.  CHF seems compensated. -Hold torsemide and spironolactone since patient has limited  oral intake -Check BNP --> 149  HTN (hypertension): -IV hydralazine as needed for Bp >160 due to [place] -Cozaar and metoprolol  COPD (chronic obstructive pulmonary disease) ([place]): Stable -Bronchodilators  Elevated troponin and hx of CAD (coronary artery disease), native coronary artery: No CP.  Trop 20 -->17.  Already normalized.  Likely nonspecific. -Pravastatin  HLD (hyperlipidemia) -Pravastatin  Lung nodule: -f/u CT-chest/abd/pelvis  CKD (chronic kidney disease), stage IIIa: Renal function close to baseline.  Recent baseline creatinine 1.0-1.3.  His creatinine is 1.20, BUN 23 -f/u with BMP  Iron deficiency anemia: Hemoglobin stable, 10.1 -Continue iron supplement  Atrial fibrillation, chronic ([name]) -Continue metoprolol -Hold Eliquis    DVT ppx: SCD Code Status: Partial code per his son (OK for CPR, no intubation) Family Communication:   Yes, patient's son   at bed side Disposition Plan:  Anticipate discharge back to previous environment Consults called: Dr. Izora Ribas Admission status and Level of care: Stepdown:  as inpt       Status is: Inpatient  Remains inpatient appropriate because:Inpatient level of care appropriate due to severity of illness  Dispo: The patient is from: Home              Anticipated d/c is to: Home              Patient currently is not medically stable to d/c.   Difficult to place patient No           Date of Service 05/27/2021    Ivor [facility]   If 7PM-7AM, please contact night-coverage [email or web address] 05/27/2021, 5:00 PM

## 2021-05-27 NOTE — ED Notes (Signed)
Seizure pads applied. Son at bedside.

## 2021-05-27 NOTE — Progress Notes (Addendum)
Received patient from ED via stretcher on NRB. Patient is awake alert but non-verbal, would not answer questions. He moves all extremities.   Connected to the monitor. VS checked. CHG bath done. MRSA swab done. Patient already had repeat CT prior to arriving in the unit.   Patient has Stage 2 sacral area. Sacral foam applied. Endorsed to incoming RN.   Family in the waiting room.

## 2021-05-27 NOTE — ED Triage Notes (Signed)
Pt comes into the ED via [facility] from home c/o weakness.  Pt had a difficult time standing and he does normally walk with a walker.  Pt denies any pain and is negative for stroke screen.  Per the son, he is not at baseline, but unsure what he believes is "off".  Pt answering all questions appropriately for EMS. Pt has recent skin cancer removal from lip, appearance is at baseline.  98.4, 220 CBG, 96% RA, 176/81, a-fib 70-100 rate.

## 2021-05-27 NOTE — Consult Note (Signed)
Referring Physician:  No referring provider defined for this encounter.  Primary Physician:  [name], MD  Chief Complaint:  possible seizure  Justin Leonard is unable to provide history - level 5 qualifier.  History of Present Illness: 05/27/2021 Justin Leonard is a 87 y.o. male who presents with the chief complaint of altered mental status. He normally lives with his son, but is very independent and is cognitively intact. He told his son today that he may have had a seizure.  He was not as cognitively aware as normal.  His son brought him to ER for evaluation.  He did not take his eliquis today.  The patient reports a mild headache.  He is unable to provide further history.  Of note, the patient has a smoking history and had melanoma in the past.  Review of Systems:  A 10 point review of systems is negative, except for the pertinent positives and negatives detailed in the HPI.  Past Medical History: Past Medical History:  Diagnosis Date   Afib ([place])    Anemia    Arthritis    Back pain    CHF (congestive heart failure) ([name])    COPD (chronic obstructive pulmonary disease) ([name])    Coronary artery disease    Deafness    Depression    Diabetes mellitus without complication ([name])    Dizziness    when gets up   GERD (gastroesophageal reflux disease)    Hyperlipidemia    Hypertension    Melena    Myocardial infarction ([place])    maybe a small one   Obesity    Oxygen decrease    WEARS HS,PRN   Peripheral neuropathy    Peripheral neuropathy    bil.hands and feet   Skin cancer    Umbilical hernia     Past Surgical History: Past Surgical History:  Procedure Laterality Date   CARDIAC CATHETERIZATION Left 07/17/2015   Procedure: Right/Left Heart Cath and Coronary Angiography;  Surgeon: [name], MD;  Location: [place] [facility];  Service: Cardiovascular;  Laterality: Left;   CARDIAC CATHETERIZATION N/A 07/17/2015   Procedure: Coronary Stent  Intervention;  Surgeon: [name], MD;  Location: [place];  Service: Cardiovascular;  Laterality: N/A;   CARDIAC CATHETERIZATION Right 05/10/2016   Procedure: Left Heart Cath and Coronary Angiography;  Surgeon: [name], MD;  Location: [place] [facility];  Service: Cardiovascular;  Laterality: Right;   CAROTID PTA/STENT INTERVENTION Right 07/12/2019   Procedure: CAROTID PTA/STENT INTERVENTION;  Surgeon: Algernon Huxley, MD;  Location: [place];  Service: Cardiovascular;  Laterality: Right;   CHOLECYSTECTOMY N/A 11/04/2016   Procedure: LAPAROSCOPIC CHOLECYSTECTOMY, POSSIBLE OPEN umbilical hernia repair;  Surgeon: [name], MD;  Location: [facility];  Service: General;  Laterality: N/A;   COLONOSCOPY WITH PROPOFOL N/A 12/12/2015   Procedure: COLONOSCOPY WITH PROPOFOL;  Surgeon: [name], MD;  Location: [facility];  Service: Gastroenterology;  Laterality: N/A;   CORONARY ANGIOPLASTY     stent 8/16   CORONARY ARTERY BYPASS GRAFT  1994   ESOPHAGOGASTRODUODENOSCOPY N/A 11/10/2015   Procedure: ESOPHAGOGASTRODUODENOSCOPY (EGD);  Surgeon: [name], MD;  Location: [facility];  Service: Endoscopy;  Laterality: N/A;   EYE SURGERY     gall bladder drain     MOHS SURGERY      Allergies: Allergies as of 05/27/2021 - Review Complete 05/27/2021  Allergen Reaction Noted   No known allergies  01/22/2020  Medications: No current facility-administered medications for this encounter.  Current Outpatient Medications:    acetaminophen (TYLENOL) 650 MG CR tablet, Take 650 mg by mouth 2 (two) times daily as needed for pain. , Disp: , Rfl:    ELIQUIS 2.5 MG TABS tablet, Take 2.5 mg by mouth 2 (two) times daily., Disp: , Rfl:    ferrous sulfate 325 (65 FE) MG tablet, Take 325 mg by mouth 2 (two) times daily. , Disp: , Rfl:    FLUoxetine (PROZAC) 40 MG capsule, Take 40 mg by mouth daily. , Disp: , Rfl:    losartan (COZAAR) 25 MG tablet, , Disp: , Rfl:     lovastatin (MEVACOR) 20 MG tablet, Take 20 mg by mouth at bedtime., Disp: , Rfl:    metoprolol succinate (TOPROL-XL) 25 MG 24 hr tablet, Take 12.5 mg by mouth daily. , Disp: , Rfl:    pantoprazole (PROTONIX) 40 MG tablet, Take 40 mg by mouth daily. , Disp: , Rfl:    Polyethylene Glycol 3350 (MIRALAX PO), Take 17 g by mouth daily., Disp: , Rfl:    spironolactone (ALDACTONE) 25 MG tablet, , Disp: , Rfl:    tamsulosin (FLOMAX) 0.4 MG CAPS capsule, Take 1 capsule (0.4 mg total) by mouth daily., Disp: 90 capsule, Rfl: 3   Tiotropium Bromide Monohydrate 2.5 MCG/ACT AERS, Inhale 2 puffs into the lungs See admin instructions. Inhale 2 puffs 1 to 2 times a day., Disp: , Rfl:    torsemide (DEMADEX) 20 MG tablet, , Disp: , Rfl:    VENTOLIN HFA 108 (90 BASE) MCG/ACT inhaler, Inhale 1-2 puffs into the lungs as needed for wheezing or shortness of breath. Reported on 12/16/2015, Disp: , Rfl:    clopidogrel (PLAVIX) 75 MG tablet, Take 1 tablet by mouth once daily (Patient not taking: No sig reported), Disp: 90 tablet, Rfl: 0   nitroGLYCERIN (NITROSTAT) 0.4 MG SL tablet, Place 0.4 mg under the tongue every 5 (five) minutes as needed for chest pain., Disp: , Rfl:    OXYGEN, Place 2 L into the nose Nightly., Disp: , Rfl:    Social History: Social History   Tobacco Use   Smoking status: Former    Packs/day: 3.00    Years: 30.00    Pack years: 90.00    Types: Cigarettes    Quit date: 04/16/1978    Years since quitting: 43.1   Smokeless tobacco: Never   Tobacco comments:    Quit smoking 1979  Vaping Use   Vaping Use: Never used  Substance Use Topics   Alcohol use: No    Alcohol/week: 0.0 standard drinks   Drug use: No    Family Medical History: Family History  Problem Relation Age of Onset   Hypertension Father    CVA Father    Anemia Neg Hx    Arrhythmia Neg Hx    Asthma Neg Hx    Clotting disorder Neg Hx    Fainting Neg Hx    Heart attack Neg Hx    Heart disease Neg Hx    Heart failure Neg  Hx    Hyperlipidemia Neg Hx    Prostate cancer Neg Hx    Bladder Cancer Neg Hx    Kidney cancer Neg Hx     Physical Examination: Vitals:   05/27/21 1345 05/27/21 1400  BP: (!) 156/76 (!) 177/91  Pulse: 89 (!) 121  Resp: 16 (!) 22  Temp:    SpO2: 94% 92%     General: Patient is  well developed, well nourished, calm, collected, and in no apparent distress.   Psychiatric: Patient is non-anxious.  Head:  Pupils equal, round, and reactive to light.  ENT:  Oral mucosa appears well hydrated.  Neck:   Supple.  Full range of motion.  Respiratory: Patient is breathing without any difficulty.  Extremities: No edema.  Vascular: Palpable pulses in dorsal pedal vessels.  Skin:   On exposed skin, there are many abnormal skin lesions.  NEUROLOGICAL:  General: In no acute distress.   Awake, alert, oriented to person, place, but not to time.  Pupils equal round and reactive to light.  Facial tone is symmetric.  Tongue protrusion is midline.  There is no pronator drift.  Repeats with some errors. He is able to name 3/3.  Strength: Side Biceps Triceps Deltoid Interossei Grip Wrist Ext. Wrist Flex.  R 5 5 5 5 5 5 5   L 5 5 5 5 5 5 5    Side Iliopsoas Quads Hamstring PF DF EHL  R 5 5 5 5 5 5   L 5 5 5 5 5 5     Bilateral upper and lower extremity sensation is intact to light touch. Reflexes are 1+ and symmetric at the biceps, triceps, brachioradialis, patella and achilles. Hoffman's is absent.  Clonus is not present.  Toes are down-going.    Gait is untested.  Imaging: CT Head C spine 05/27/21 IMPRESSION: CT head:   1. Acute parenchymal hemorrhages (3 total) within the bilateral frontal lobes with surrounding edema. The largest hemorrhage is present within the medial mid left frontal lobe, measuring 2.5 x 3.0 cm. Underlying lesions (i.e. metastases) at these sites cannot be excluded, and a contrast-enhanced brain MRI is recommended for further evaluation. 2. Trace  subarachnoid hemorrhage within the posterior right sylvian fissure. 3. Chronic left basal ganglia lacunar infarct. 4. Background generalized parenchymal atrophy and cerebral white matter chronic small vessel ischemic disease.   CT cervical spine:   1. No evidence of acute fracture to the cervical spine. 2. 2 mm C3-C4 grade 1 anterolisthesis. 3. Trace fused C5-C6 grade 1 retrolisthesis. 4. Cervical spondylosis, as described. 5. Fusion across the C2-C3 disc space and posterior elements.     Electronically Signed   By: Kellie Simmering DO   On: 05/27/2021 12:59  I have personally reviewed the images and agree with the above interpretation.  Labs: CBC Latest Ref Rng & Units 05/27/2021 11/06/2019 07/13/2019  WBC 4.0 - 10.5 K/uL 8.0 9.6 6.6  Hemoglobin 13.0 - 17.0 g/dL 10.1(L) 10.1(L) 9.8(L)  Hematocrit 39.0 - 52.0 % 29.6(L) 30.3(L) 29.8(L)  Platelets 150 - 400 K/uL 166 169 152       Assessment and Plan: Mr. Kalman is a pleasant 87 y.o. male with multiple hyperdense lesions on CT concerning for metastatic disease.  Given history of melanoma, this is concerned for intracranial involvement of melanoma or some other primary.  - Hold anticoagulation - Repeat HCT at 6 hours to confirm stability - Keppra 1000mg  now and 500 mg BID  - Dexamethasone 10 mg now, consider ongoing if needed - MRI brain with and without - CT CAP to evaluate for metastatic disease and assess for biopsy   Darin Redmann K. Izora Ribas MD, [place] [facility]

## 2021-05-27 NOTE — ED Provider Notes (Signed)
[facility] Emergency Department Provider Note  ____________________________________________   Event Date/Time   First MD Initiated Contact with Patient 05/27/21 1113     (approximate)  I have reviewed the triage vital signs and the nursing notes.   HISTORY  Chief Complaint Weakness    HPI Justin Leonard is a 87 y.o. male with extensive past medical history as below here with confusion and reported seizure-like activity.  History is somewhat limited as patient is confused.  Per report, patient is normally alert and oriented.  He told his son, who he lives with, that he had a seizure overnight.  He is been confused and intermittently answering questions incorrectly.  He states that he has a mild headache, unable to further elaborate.  He has no history of similar symptoms.  He reportedly has been having increased urinary frequency lately.  Patient does state to me that he feels like he had a seizure overnight.  He states he woke up and was shaking.  He was alert, however.  No history of seizures.  No recent trauma.  He does not believe he fell, though he does not necessarily know.  Denies other complaints.  Remainder of history limited due to confusion.    Past Medical History:  Diagnosis Date   Afib ([place])    Anemia    Arthritis    Back pain    CHF (congestive heart failure) ([name])    COPD (chronic obstructive pulmonary disease) ([name])    Coronary artery disease    Deafness    Depression    Diabetes mellitus without complication ([name])    Dizziness    when gets up   GERD (gastroesophageal reflux disease)    Hyperlipidemia    Hypertension    Melena    Myocardial infarction ([place])    maybe a small one   Obesity    Oxygen decrease    WEARS HS,PRN   Peripheral neuropathy    Peripheral neuropathy    bil.hands and feet   Skin cancer    Umbilical hernia     Patient Active Problem List   Diagnosis Date Noted   Carotid stenosis, bilateral  07/12/2019   Symptomatic carotid artery stenosis 07/03/2019   GERD (gastroesophageal reflux disease) 01/08/2019   Atrial fibrillation with RVR ([place]) 01/08/2019   Chest pain 12/05/2017   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   Epigastric hernia    PAH (pulmonary artery hypertension) ([place]) 10/13/2016   Abdominal pain, bilateral upper quadrant 05/09/2016   Diabetic hypoglycemia ([place]) 10/13/2015   Chronic diastolic heart failure ([place]) 04/17/2015   HTN (hypertension) 04/17/2015   Diabetes ([place]) 04/17/2015   COPD (chronic obstructive pulmonary disease) ([place]) 04/17/2015   Clinical depression 08/23/2012   Melanoma in situ ([place]) 08/23/2012   Exomphalos 08/23/2012   Major depressive disorder with single episode 08/23/2012   Difficulty hearing 01/18/2012   HLD (hyperlipidemia) 01/18/2012   Peripheral nerve disease 01/18/2012   Adiposity 01/18/2012   CAD (coronary artery disease), native coronary artery 01/18/2012    Past Surgical History:  Procedure Laterality Date   CARDIAC CATHETERIZATION Left 07/17/2015   Procedure: Right/Left Heart Cath and Coronary Angiography;  Surgeon: [name], MD;  Location: [place] [facility];  Service: Cardiovascular;  Laterality: Left;   CARDIAC CATHETERIZATION N/A 07/17/2015   Procedure: Coronary Stent Intervention;  Surgeon: [name], MD;  Location: [place] [facility];  Service: Cardiovascular;  Laterality: N/A;   CARDIAC CATHETERIZATION Right 05/10/2016   Procedure: Left Heart  Cath and Coronary Angiography;  Surgeon: [name], MD;  Location: [place] [facility];  Service: Cardiovascular;  Laterality: Right;   CAROTID PTA/STENT INTERVENTION Right 07/12/2019   Procedure: CAROTID PTA/STENT INTERVENTION;  Surgeon: Algernon Huxley, MD;  Location: [place] [facility];  Service: Cardiovascular;  Laterality: Right;   CHOLECYSTECTOMY N/A 11/04/2016   Procedure: LAPAROSCOPIC CHOLECYSTECTOMY, POSSIBLE OPEN umbilical hernia repair;  Surgeon: [name],  MD;  Location: [facility] OR[facility];  Service: General;  Laterality: N/A;   COLONOSCOPY WITH PROPOFOL N/A 12/12/2015   Procedure: COLONOSCOPY WITH PROPOFOL;  Surgeon: [name], MD;  Location: [facility] ENDOSCOPY;  Service: Gastroenterology;  Laterality: N/A;   CORONARY ANGIOPLASTY     stent 8/16   CORONARY ARTERY BYPASS GRAFT  1994   ESOPHAGOGASTRODUODENOSCOPY N/A 11/10/2015   Procedure: ESOPHAGOGASTRODUODENOSCOPY (EGD);  Surgeon: [name], MD;  Location: [facility] ENDOSCOPY;  Service: Endoscopy;  Laterality: N/A;   EYE SURGERY     gall bladder drain     MOHS SURGERY      Prior to Admission medications   Medication Sig Start Date End Date Taking? Authorizing Provider  acetaminophen (TYLENOL) 650 MG CR tablet Take 650 mg by mouth 2 (two) times daily as needed for pain.    Yes [provider]  ELIQUIS 2.5 MG TABS tablet Take 2.5 mg by mouth 2 (two) times daily. 02/13/21  Yes [provider]  ferrous sulfate 325 (65 FE) MG tablet Take 325 mg by mouth 2 (two) times daily.    Yes [provider]  FLUoxetine (PROZAC) 40 MG capsule Take 40 mg by mouth daily.  05/24/17  Yes [provider]  losartan (COZAAR) 25 MG tablet  03/23/20  Yes [provider]  lovastatin (MEVACOR) 20 MG tablet Take 20 mg by mouth at bedtime.   Yes [provider]  metoprolol succinate (TOPROL-XL) 25 MG 24 hr tablet Take 12.5 mg by mouth daily.    Yes [provider]  pantoprazole (PROTONIX) 40 MG tablet Take 40 mg by mouth daily.  08/26/15  Yes [provider]  Polyethylene Glycol 3350 (MIRALAX PO) Take 17 g by mouth daily.   Yes [provider]  spironolactone (ALDACTONE) 25 MG tablet  03/28/20  Yes [provider]  tamsulosin (FLOMAX) 0.4 MG CAPS capsule Take 1 capsule (0.4 mg total) by mouth daily. 10/14/20  Yes McGowan, [name], PA-C  Tiotropium Bromide Monohydrate 2.5 MCG/ACT AERS Inhale 2 puffs into the lungs See admin instructions. Inhale 2 puffs 1 to  2 times a day.   Yes [provider]  torsemide (DEMADEX) 20 MG tablet  03/28/20  Yes [provider]  VENTOLIN HFA 108 (90 BASE) MCG/ACT inhaler Inhale 1-2 puffs into the lungs as needed for wheezing or shortness of breath. Reported on 12/16/2015 06/24/15  Yes [provider]  clopidogrel (PLAVIX) 75 MG tablet Take 1 tablet by mouth once daily Patient not taking: No sig reported 01/09/21   Algernon Huxley, MD  nitroGLYCERIN (NITROSTAT) 0.4 MG SL tablet Place 0.4 mg under the tongue every 5 (five) minutes as needed for chest pain.    [provider]  OXYGEN Place 2 L into the nose Nightly.    [provider]    Allergies No known allergies  Family History  Problem Relation Age of Onset   Hypertension Father    CVA Father    Anemia Neg Hx    Arrhythmia Neg Hx    Asthma Neg  Hx    Clotting disorder Neg Hx    Fainting Neg Hx    Heart attack Neg Hx    Heart disease Neg Hx    Heart failure Neg Hx    Hyperlipidemia Neg Hx    Prostate cancer Neg Hx    Bladder Cancer Neg Hx    Kidney cancer Neg Hx     Social History Social History   Tobacco Use   Smoking status: Former    Packs/day: 3.00    Years: 30.00    Pack years: 90.00    Types: Cigarettes    Quit date: 04/16/1978    Years since quitting: 43.1   Smokeless tobacco: Never   Tobacco comments:    Quit smoking 1979  Vaping Use   Vaping Use: Never used  Substance Use Topics   Alcohol use: No    Alcohol/week: 0.0 standard drinks   Drug use: No    Review of Systems  Review of Systems  Unable to perform ROS: Mental status change    ____________________________________________  PHYSICAL EXAM:      VITAL SIGNS: ED Triage Vitals  Enc Vitals Group     BP 05/27/21 0902 (!) 160/83     Pulse Rate 05/27/21 0902 83     Resp 05/27/21 0902 18     Temp 05/27/21 0902 97.7 F (36.5 C)     Temp Source 05/27/21 0902 Oral     SpO2 05/27/21 0902 96 %     Weight 05/27/21 0903 209 lb 7 oz (95  kg)     Height 05/27/21 0903 6\' 2"  (1.88 m)     Head Circumference --      Peak Flow --      Pain Score 05/27/21 0903 0     Pain Loc --      Pain Edu? --      Excl. in [place]? --      Physical Exam Vitals and nursing note reviewed.  Constitutional:      General: He is not in acute distress.    Appearance: He is well-developed.  HENT:     Head: Normocephalic and atraumatic.     Comments: No apparent head trauma Eyes:     Conjunctiva/sclera: Conjunctivae normal.  Cardiovascular:     Rate and Rhythm: Normal rate and regular rhythm.     Heart sounds: Normal heart sounds.  Pulmonary:     Effort: Pulmonary effort is normal. No respiratory distress.     Breath sounds: No wheezing.  Abdominal:     General: There is no distension.  Musculoskeletal:     Cervical back: Neck supple.  Skin:    General: Skin is warm.     Capillary Refill: Capillary refill takes less than 2 seconds.     Findings: No rash.  Neurological:     Mental Status: He is alert and oriented to person, place, and time.     Motor: No abnormal muscle tone.     Comments: Confused, oriented to person only.  Follows commands.  Cranial nerves intact.  Strength 5-5 bilateral from lower extremities, though diffusely weak and unable to ambulate independently.  Unable to participate in finger-to-nose due to confusion.      ____________________________________________   LABS (all labs ordered are listed, but only abnormal results are displayed)  Labs Reviewed  BASIC METABOLIC PANEL - Abnormal; Notable for the following components:      Result Value   Glucose, Bld 208 (*)    GFR,  Estimated 57 (*)    All other components within normal limits  CBC - Abnormal; Notable for the following components:   RBC 3.23 (*)    Hemoglobin 10.1 (*)    HCT 29.6 (*)    All other components within normal limits  URINALYSIS, COMPLETE (UACMP) WITH MICROSCOPIC - Abnormal; Notable for the following components:   Color, Urine YELLOW (*)     APPearance CLEAR (*)    Glucose, UA 50 (*)    Hgb urine dipstick SMALL (*)    Protein, ur 100 (*)    All other components within normal limits  TROPONIN I (HIGH SENSITIVITY) - Abnormal; Notable for the following components:   Troponin I (High Sensitivity) 20 (*)    All other components within normal limits  PROTIME-INR  APTT  CBG MONITORING, ED  TROPONIN I (HIGH SENSITIVITY)    ____________________________________________  EKG: Atrial fibrillation, ventricular rate 80.  QRS 148, QTc 472.  No acute ST elevations or depressions. No ischemia or infarct noted. ________________________________________  RADIOLOGY All imaging, including plain films, CT scans, and ultrasounds, independently reviewed by me, and interpretations confirmed via formal radiology reads.  ED MD interpretation:   CXR: CABG, multiple pulm nodules CT Head: Multiple areas of IPH, concerning for metastatic disease with bleeding, vasogenic edema  Official radiology report(s): CT Head Wo Contrast  Result Date: 05/27/2021 CLINICAL DATA:  Delirium. EXAM: CT HEAD WITHOUT CONTRAST CT CERVICAL SPINE WITHOUT CONTRAST TECHNIQUE: Multidetector CT imaging of the head and cervical spine was performed following the standard protocol without intravenous contrast. Multiplanar CT image reconstructions of the cervical spine were also generated. COMPARISON:  No pertinent prior exams available for comparison. FINDINGS: CT HEAD FINDINGS Brain: Moderate generalized cerebral atrophy. Comparatively mild cerebellar atrophy. 2.5 x 2.4 x 3.0 cm acute parenchymal hemorrhage within the medial mid left frontal lobe with mild to moderate surrounding edema. 1 cm acute parenchymal hemorrhage within the medial mid right frontal lobe with mild surrounding edema (series 2, image 22). 1.0 x 1.4 x 1.0 within the lateral right frontal lobe with mild surrounding edema (series 2, image 17). Trace acute subarachnoid hemorrhage along the posterior aspect of the  right sylvian fissure (series 2, image 14). Chronic lacunar infarct within the left caudate nucleus. Background mild ill-defined hypoattenuation within the cerebral white matter, nonspecific but compatible with chronic small vessel ischemic disease. No demarcated cortical infarct. No hydrocephalus or midline shift. Vascular: No hyperdense vessel.  Atherosclerotic calcifications. Skull: Normal. Negative for fracture or focal lesion. Sinuses/Orbits: Visualized orbits show no acute finding. No significant paranasal sinus disease. CT CERVICAL SPINE FINDINGS Alignment: Cervical dextrocurvature. Straightening of the expected cervical lordosis. 2 mm C3-C4 grade 1 anterolisthesis. Fused C5-C6 grade 1 retrolisthesis. Skull base and vertebrae: The basion-dental and atlanto-dental intervals are maintained.No evidence of acute fracture to the cervical spine. Fusion across the disc space at C2-C3. Bilateral C2-C3 facet joint ankylosis also present. Soft tissues and spinal canal: No prevertebral fluid or swelling. No visible canal hematoma. Right carotid artery stent. Disc levels: Cervical spondylosis with multilevel disc space narrowing, disc bulges, posterior disc osteophytes, endplate spurring, uncovertebral hypertrophy and facet arthrosis. Multilevel spinal canal stenosis. Most notably, there is apparent moderate spinal canal stenosis at C5-C6. Multilevel ventral osteophytes. Most notably, a bridging ventral osteophyte is present at C5-C6. Upper chest: The upper chest is excluded from the field of view. These results were called by telephone at the time of interpretation on 05/27/2021 at 12:52 pm to provider Duffy Bruce , who verbally  acknowledged these results. IMPRESSION: CT head: 1. Acute parenchymal hemorrhages (3 total) within the bilateral frontal lobes with surrounding edema. The largest hemorrhage is present within the medial mid left frontal lobe, measuring 2.5 x 3.0 cm. Underlying lesions (i.e. metastases) at  these sites cannot be excluded, and a contrast-enhanced brain MRI is recommended for further evaluation. 2. Trace subarachnoid hemorrhage within the posterior right sylvian fissure. 3. Chronic left basal ganglia lacunar infarct. 4. Background generalized parenchymal atrophy and cerebral white matter chronic small vessel ischemic disease. CT cervical spine: 1. No evidence of acute fracture to the cervical spine. 2. 2 mm C3-C4 grade 1 anterolisthesis. 3. Trace fused C5-C6 grade 1 retrolisthesis. 4. Cervical spondylosis, as described. 5. Fusion across the C2-C3 disc space and posterior elements. Electronically Signed   By: Kellie Simmering DO   On: 05/27/2021 12:59   CT CERVICAL SPINE WO CONTRAST  Result Date: 05/27/2021 CLINICAL DATA:  Delirium. EXAM: CT HEAD WITHOUT CONTRAST CT CERVICAL SPINE WITHOUT CONTRAST TECHNIQUE: Multidetector CT imaging of the head and cervical spine was performed following the standard protocol without intravenous contrast. Multiplanar CT image reconstructions of the cervical spine were also generated. COMPARISON:  No pertinent prior exams available for comparison. FINDINGS: CT HEAD FINDINGS Brain: Moderate generalized cerebral atrophy. Comparatively mild cerebellar atrophy. 2.5 x 2.4 x 3.0 cm acute parenchymal hemorrhage within the medial mid left frontal lobe with mild to moderate surrounding edema. 1 cm acute parenchymal hemorrhage within the medial mid right frontal lobe with mild surrounding edema (series 2, image 22). 1.0 x 1.4 x 1.0 within the lateral right frontal lobe with mild surrounding edema (series 2, image 17). Trace acute subarachnoid hemorrhage along the posterior aspect of the right sylvian fissure (series 2, image 14). Chronic lacunar infarct within the left caudate nucleus. Background mild ill-defined hypoattenuation within the cerebral white matter, nonspecific but compatible with chronic small vessel ischemic disease. No demarcated cortical infarct. No hydrocephalus or  midline shift. Vascular: No hyperdense vessel.  Atherosclerotic calcifications. Skull: Normal. Negative for fracture or focal lesion. Sinuses/Orbits: Visualized orbits show no acute finding. No significant paranasal sinus disease. CT CERVICAL SPINE FINDINGS Alignment: Cervical dextrocurvature. Straightening of the expected cervical lordosis. 2 mm C3-C4 grade 1 anterolisthesis. Fused C5-C6 grade 1 retrolisthesis. Skull base and vertebrae: The basion-dental and atlanto-dental intervals are maintained.No evidence of acute fracture to the cervical spine. Fusion across the disc space at C2-C3. Bilateral C2-C3 facet joint ankylosis also present. Soft tissues and spinal canal: No prevertebral fluid or swelling. No visible canal hematoma. Right carotid artery stent. Disc levels: Cervical spondylosis with multilevel disc space narrowing, disc bulges, posterior disc osteophytes, endplate spurring, uncovertebral hypertrophy and facet arthrosis. Multilevel spinal canal stenosis. Most notably, there is apparent moderate spinal canal stenosis at C5-C6. Multilevel ventral osteophytes. Most notably, a bridging ventral osteophyte is present at C5-C6. Upper chest: The upper chest is excluded from the field of view. These results were called by telephone at the time of interpretation on 05/27/2021 at 12:52 pm to provider Duffy Bruce , who verbally acknowledged these results. IMPRESSION: CT head: 1. Acute parenchymal hemorrhages (3 total) within the bilateral frontal lobes with surrounding edema. The largest hemorrhage is present within the medial mid left frontal lobe, measuring 2.5 x 3.0 cm. Underlying lesions (i.e. metastases) at these sites cannot be excluded, and a contrast-enhanced brain MRI is recommended for further evaluation. 2. Trace subarachnoid hemorrhage within the posterior right sylvian fissure. 3. Chronic left basal ganglia lacunar infarct. 4. Background generalized parenchymal  atrophy and cerebral white matter  chronic small vessel ischemic disease. CT cervical spine: 1. No evidence of acute fracture to the cervical spine. 2. 2 mm C3-C4 grade 1 anterolisthesis. 3. Trace fused C5-C6 grade 1 retrolisthesis. 4. Cervical spondylosis, as described. 5. Fusion across the C2-C3 disc space and posterior elements. Electronically Signed   By: Kellie Simmering DO   On: 05/27/2021 12:59   DG Chest Portable 1 View  Result Date: 05/27/2021 CLINICAL DATA:  Cough.  Weakness. EXAM: PORTABLE CHEST 1 VIEW COMPARISON:  12/05/2017. FINDINGS: Prior CABG. Cardiomegaly. Bilateral interstitial prominence. Interstitial edema and or pneumonitis cannot be excluded. Bilateral pulmonary nodular densities are noted. Chest CT can be obtained for further evaluation. No pleural effusion or pneumothorax. Biapical pleural thickening consistent scarring. No acute bony abnormality. IMPRESSION: 1. Prior CABG. Cardiomegaly. Bilateral interstitial prominence consistent with interstitial edema. Pneumonitis cannot be excluded. 2. Multiple bilateral pulmonary nodules for which chest CT can be obtained for further evaluation. These pulmonary nodules could be infectious or secondary to metastatic disease. Electronically Signed   By: Marcello Moores  Register   On: 05/27/2021 12:37    ____________________________________________  PROCEDURES   Procedure(s) performed (including Critical Care):  .Critical Care  Date/Time: 05/27/2021 1:51 PM Performed by: Duffy Bruce, MD Authorized by: Duffy Bruce, MD   Critical care provider statement:    Critical care time (minutes):  35   Critical care time was exclusive of:  Separately billable procedures and treating other patients and teaching time   Critical care was necessary to treat or prevent imminent or life-threatening deterioration of the following conditions:  Cardiac failure, circulatory failure and CNS failure or compromise   Critical care was time spent personally by me on the following activities:   Development of treatment plan with patient or surrogate, discussions with consultants, evaluation of patient's response to treatment, examination of patient, obtaining history from patient or surrogate, ordering and performing treatments and interventions, ordering and review of laboratory studies, ordering and review of radiographic studies, pulse oximetry, re-evaluation of patient's condition and review of old charts   I assumed direction of critical care for this patient from another provider in my specialty: no    ____________________________________________  INITIAL IMPRESSION / MDM / [place] / ED COURSE  As part of my medical decision making, I reviewed the following data within the [place] notes reviewed and incorporated, Old chart reviewed, Notes from prior ED visits, and [place] Controlled Substance Database       *Justin Leonard was evaluated in Emergency Department on 05/27/2021 for the symptoms described in the history of present illness. He was evaluated in the context of the global COVID-19 pandemic, which necessitated consideration that the patient might be at risk for infection with the SARS-CoV-2 virus that causes COVID-19. Institutional protocols and algorithms that pertain to the evaluation of patients at risk for COVID-19 are in a state of rapid change based on information released by regulatory bodies including the CDC and federal and state organizations. These policies and algorithms were followed during the patient's care in the ED.  Some ED evaluations and interventions may be delayed as a result of limited staffing during the pandemic.*     Medical Decision Making:  87 yo M here with AMS. CT head reviewed, is concerning for multiple areas of intraparenchymal hemorrhage.  There is some surrounding edema, particularly in the left frontal lobe area, with findings concerning for possible underlying metastatic disease.  The patient does have  a  remote history of melanoma.  No known metastases per report.  Chest x-ray reviewed, she has pulmonary nodules which certainly is concerning given his history.  Otherwise, patient is hemodynamically stable.  UA without signs of UTI.  Hemoglobin is at baseline.  BMP unremarkable.  Patient discussed with Dr. Cari Caraway, who will evaluate the patient in the ED.  IV Keppra, Decadron started.  Will admit to medicine. Discussed with pt and son, especially my concern for underlying malignancy/mass. IV labetalol PRN SBP>160.  ____________________________________________  FINAL CLINICAL IMPRESSION(S) / ED DIAGNOSES  Final diagnoses:  Intraparenchymal hematoma of brain, left, without loss of consciousness, initial encounter ([place])  New onset seizure ([place])     MEDICATIONS GIVEN DURING THIS VISIT:  Medications  labetalol (NORMODYNE) injection 10 mg (0 mg Intravenous Hold 05/27/21 1350)  levETIRAcetam (KEPPRA) IVPB 1000 mg/100 mL premix (0 mg Intravenous Stopped 05/27/21 1349)  dexamethasone (DECADRON) injection 10 mg (10 mg Intravenous Given 05/27/21 1349)     ED Discharge Orders     None        Note:  This document was prepared using Dragon voice recognition software and may include unintentional dictation errors.   Duffy Bruce, MD 05/27/21 1351

## 2021-05-28 DIAGNOSIS — L8990 Pressure ulcer of unspecified site, unspecified stage: Secondary | ICD-10-CM | POA: Insufficient documentation

## 2021-05-28 LAB — BASIC METABOLIC PANEL
Anion gap: 10 (ref 5–15)
BUN: 21 mg/dL (ref 8–23)
CO2: 24 mmol/L (ref 22–32)
Calcium: 9.8 mg/dL (ref 8.9–10.3)
Chloride: 102 mmol/L (ref 98–111)
Creatinine, Ser: 1.13 mg/dL (ref 0.61–1.24)
GFR, Estimated: >60 mL/min (ref >60)
Glucose, Bld: 306 mg/dL — ABNORMAL HIGH (ref 70–99)
Potassium: 4 mmol/L (ref 3.5–5.1)
Sodium: 136 mmol/L (ref 135–145)

## 2021-05-28 LAB — CBC
HCT: 31.1 % — ABNORMAL LOW (ref 39.0–52.0)
Hemoglobin: 10.7 g/dL — ABNORMAL LOW (ref 13.0–17.0)
MCH: 31.5 pg (ref 26.0–34.0)
MCHC: 34.4 g/dL (ref 30.0–36.0)
MCV: 91.5 fL (ref 80.0–100.0)
Platelets: 170 10*3/uL (ref 150–400)
RBC: 3.4 MIL/uL — ABNORMAL LOW (ref 4.22–5.81)
RDW: 12.9 % (ref 11.5–15.5)
WBC: 6.8 10*3/uL (ref 4.0–10.5)
nRBC: 0 % (ref 0.0–0.2)

## 2021-05-28 LAB — GLUCOSE, CAPILLARY
Glucose-Capillary: 301 mg/dL — ABNORMAL HIGH (ref 70–99)
Glucose-Capillary: 231 mg/dL — ABNORMAL HIGH (ref 70–99)
Glucose-Capillary: 207 mg/dL — ABNORMAL HIGH (ref 70–99)

## 2021-05-28 LAB — HEMOGLOBIN A1C
Hgb A1c MFr Bld: 7.6 % — ABNORMAL HIGH (ref 4.8–5.6)
Mean Plasma Glucose: 171 mg/dL

## 2021-05-28 MED ORDER — DEXAMETHASONE SODIUM PHOSPHATE 4 MG/ML IJ SOLN
2.0000 mg | Freq: Two times a day (BID) | INTRAMUSCULAR | Status: DC
  Administered 2021-05-28 – 2021-05-31 (×6): 2 mg via INTRAVENOUS
  Filled 2021-05-28 (×6): qty 1

## 2021-05-28 MED ORDER — INSULIN ASPART 100 UNIT/ML IJ SOLN
0.0000 [IU] | Freq: Every day | INTRAMUSCULAR | Status: DC
  Administered 2021-05-28: 2 [IU] via SUBCUTANEOUS
  Administered 2021-05-29: 3 [IU] via SUBCUTANEOUS
  Filled 2021-05-28 (×2): qty 1

## 2021-05-28 MED ORDER — HYDRALAZINE HCL 20 MG/ML IJ SOLN: 10.0000 mg | INTRAMUSCULAR | Status: DC | PRN

## 2021-05-28 MED ORDER — INSULIN ASPART 100 UNIT/ML IJ SOLN
0.0000 [IU] | Freq: Three times a day (TID) | INTRAMUSCULAR | Status: DC
  Administered 2021-05-28: 7 [IU] via SUBCUTANEOUS
  Administered 2021-05-29 (×2): 5 [IU] via SUBCUTANEOUS
  Administered 2021-05-29: 3 [IU] via SUBCUTANEOUS
  Administered 2021-05-30 (×2): 5 [IU] via SUBCUTANEOUS
  Filled 2021-05-28 (×6): qty 1

## 2021-05-28 NOTE — Progress Notes (Signed)
[facility] NAME: Deshun Sedivy    MR#:  [id number]  DATE OF BIRTH:  [birth year]  SUBJECTIVE:   patient came in after possible seizure at home. He will present emergency room with altered mental status. Currently awake and to son in the ICU room. Patient a bit tearful not able to hold a meaningful conversation although follows all commands and questions that I asked. He did eat pretty well this morning. Appears to have right upper and lower extremity weakness more than the left. REVIEW OF SYSTEMS:   Review of Systems  Unable to perform ROS: Mental status change  Tolerating Diet:yes Tolerating PT:   DRUG ALLERGIES:   Allergies  Allergen Reactions  . No Known Allergies     VITALS:  Blood pressure (!) 146/75, pulse 64, temperature 98 F (36.7 C), temperature source Oral, resp. rate 19, height 6\' 2"  (1.88 m), weight 95 kg, SpO2 100 %.  PHYSICAL EXAMINATION:   Physical Exam  GENERAL:  87 y.o.-year-old patient lying in the bed with no acute distress.  LUNGS: Normal breath sounds bilaterally, no wheezing, rales, rhonchi. No use of accessory muscles of respiration.  CARDIOVASCULAR: S1, S2 normal. No murmurs, rubs, or gallops.  ABDOMEN: Soft, nontender, nondistended. Bowel sounds present. No organomegaly or mass.  EXTREMITIES: No cyanosis, clubbing or edema b/l.    NEUROLOGIC: right side hemiparesis 4/5. Unable to test in detail due to patient participation, expressive aphasia PSYCHIATRIC:  patient is alert and awake  SKIN:  Pressure Injury 05/27/21 Sacrum Mid Stage 2 -  Partial thickness loss of dermis presenting as a shallow open injury with a red, pink wound bed without slough. (Active)  05/27/21 1800  Location: Sacrum  Location Orientation: Mid  Staging: Stage 2 -  Partial thickness loss of dermis presenting as a shallow open injury with a red, pink wound bed without slough.  Wound Description (Comments):   Present on  Admission: Yes        LABORATORY PANEL:  CBC Recent Labs  Lab 05/28/21 0548  WBC 6.8  HGB 10.7*  HCT 31.1*  PLT 170    Chemistries  Recent Labs  Lab 05/28/21 0548  NA 136  K 4.0  CL 102  CO2 24  GLUCOSE 306*  BUN 21  CREATININE 1.13  CALCIUM 9.8   Cardiac Enzymes No results for input(s): TROPONINI in the last 168 hours. RADIOLOGY:  CT HEAD WO CONTRAST  Result Date: 05/27/2021 CLINICAL DATA:  Altered mental status possible seizure EXAM: CT HEAD WITHOUT CONTRAST TECHNIQUE: Contiguous axial images were obtained from the base of the skull through the vertex without intravenous contrast. COMPARISON:  CT 05/27/2021 FINDINGS: Brain: Advanced atrophy. White matter hypodensity consistent with chronic small vessel ischemic change. Redemonstrated left frontal lobe hemorrhage with hematocrit level, this measures 2.9 by 2.8 by 3.4 cm, previously 2.5 x 2.4 x 3 cm. Increased edema within the surrounding white matter. Hemorrhage within the right parasagittal frontal lobe measuring 1 by 1 by 1 cm, previously 1 cm. Hemorrhage within the lateral right frontal lobe measuring 13 x 11 x 11 mm, previously 10 x 14 x 10 mm, grossly stable. Similar degree of surrounding edema. Trace subarachnoid hemorrhage within the right sylvian fissure as before. Chronic lacunar infarct within the left caudate. No midline shift. Stable ventricle size. Vascular: No hyperdense vessels.  Carotid vascular calcification Skull: Normal. Negative for fracture or focal lesion. Sinuses/Orbits: No acute finding. Other: None IMPRESSION: 1. Multifocal acute  intraparenchymal hemorrhages within the bilateral frontal lobes with surrounding edema are again demonstrated. Largest lesion in the left frontal lobe is slightly increased in size, measuring up to 3.4 cm maximum, previously 3 cm maximum. Slight increased surrounding edema. Hemorrhage is in the right frontal lobe are grossly unchanged. Contrast-enhanced MRI previously recommended  to evaluate for underlying lesions. Trace subarachnoid hemorrhage within the right sylvian fissure is also unchanged 2. Atrophy and chronic small vessel ischemic changes of the white matter Electronically Signed   By: Donavan Foil M.D.   On: 05/27/2021 19:18   CT Head Wo Contrast  Result Date: 05/27/2021 CLINICAL DATA:  Delirium. EXAM: CT HEAD WITHOUT CONTRAST CT CERVICAL SPINE WITHOUT CONTRAST TECHNIQUE: Multidetector CT imaging of the head and cervical spine was performed following the standard protocol without intravenous contrast. Multiplanar CT image reconstructions of the cervical spine were also generated. COMPARISON:  No pertinent prior exams available for comparison. FINDINGS: CT HEAD FINDINGS Brain: Moderate generalized cerebral atrophy. Comparatively mild cerebellar atrophy. 2.5 x 2.4 x 3.0 cm acute parenchymal hemorrhage within the medial mid left frontal lobe with mild to moderate surrounding edema. 1 cm acute parenchymal hemorrhage within the medial mid right frontal lobe with mild surrounding edema (series 2, image 22). 1.0 x 1.4 x 1.0 within the lateral right frontal lobe with mild surrounding edema (series 2, image 17). Trace acute subarachnoid hemorrhage along the posterior aspect of the right sylvian fissure (series 2, image 14). Chronic lacunar infarct within the left caudate nucleus. Background mild ill-defined hypoattenuation within the cerebral white matter, nonspecific but compatible with chronic small vessel ischemic disease. No demarcated cortical infarct. No hydrocephalus or midline shift. Vascular: No hyperdense vessel.  Atherosclerotic calcifications. Skull: Normal. Negative for fracture or focal lesion. Sinuses/Orbits: Visualized orbits show no acute finding. No significant paranasal sinus disease. CT CERVICAL SPINE FINDINGS Alignment: Cervical dextrocurvature. Straightening of the expected cervical lordosis. 2 mm C3-C4 grade 1 anterolisthesis. Fused C5-C6 grade 1 retrolisthesis.  Skull base and vertebrae: The basion-dental and atlanto-dental intervals are maintained.No evidence of acute fracture to the cervical spine. Fusion across the disc space at C2-C3. Bilateral C2-C3 facet joint ankylosis also present. Soft tissues and spinal canal: No prevertebral fluid or swelling. No visible canal hematoma. Right carotid artery stent. Disc levels: Cervical spondylosis with multilevel disc space narrowing, disc bulges, posterior disc osteophytes, endplate spurring, uncovertebral hypertrophy and facet arthrosis. Multilevel spinal canal stenosis. Most notably, there is apparent moderate spinal canal stenosis at C5-C6. Multilevel ventral osteophytes. Most notably, a bridging ventral osteophyte is present at C5-C6. Upper chest: The upper chest is excluded from the field of view. These results were called by telephone at the time of interpretation on 05/27/2021 at 12:52 pm to provider Duffy Bruce , who verbally acknowledged these results. IMPRESSION: CT head: 1. Acute parenchymal hemorrhages (3 total) within the bilateral frontal lobes with surrounding edema. The largest hemorrhage is present within the medial mid left frontal lobe, measuring 2.5 x 3.0 cm. Underlying lesions (i.e. metastases) at these sites cannot be excluded, and a contrast-enhanced brain MRI is recommended for further evaluation. 2. Trace subarachnoid hemorrhage within the posterior right sylvian fissure. 3. Chronic left basal ganglia lacunar infarct. 4. Background generalized parenchymal atrophy and cerebral white matter chronic small vessel ischemic disease. CT cervical spine: 1. No evidence of acute fracture to the cervical spine. 2. 2 mm C3-C4 grade 1 anterolisthesis. 3. Trace fused C5-C6 grade 1 retrolisthesis. 4. Cervical spondylosis, as described. 5. Fusion across the C2-C3 disc space and  posterior elements. Electronically Signed   By: Kellie Simmering DO   On: 05/27/2021 12:59   CT CERVICAL SPINE WO CONTRAST  Result Date:  05/27/2021 CLINICAL DATA:  Delirium. EXAM: CT HEAD WITHOUT CONTRAST CT CERVICAL SPINE WITHOUT CONTRAST TECHNIQUE: Multidetector CT imaging of the head and cervical spine was performed following the standard protocol without intravenous contrast. Multiplanar CT image reconstructions of the cervical spine were also generated. COMPARISON:  No pertinent prior exams available for comparison. FINDINGS: CT HEAD FINDINGS Brain: Moderate generalized cerebral atrophy. Comparatively mild cerebellar atrophy. 2.5 x 2.4 x 3.0 cm acute parenchymal hemorrhage within the medial mid left frontal lobe with mild to moderate surrounding edema. 1 cm acute parenchymal hemorrhage within the medial mid right frontal lobe with mild surrounding edema (series 2, image 22). 1.0 x 1.4 x 1.0 within the lateral right frontal lobe with mild surrounding edema (series 2, image 17). Trace acute subarachnoid hemorrhage along the posterior aspect of the right sylvian fissure (series 2, image 14). Chronic lacunar infarct within the left caudate nucleus. Background mild ill-defined hypoattenuation within the cerebral white matter, nonspecific but compatible with chronic small vessel ischemic disease. No demarcated cortical infarct. No hydrocephalus or midline shift. Vascular: No hyperdense vessel.  Atherosclerotic calcifications. Skull: Normal. Negative for fracture or focal lesion. Sinuses/Orbits: Visualized orbits show no acute finding. No significant paranasal sinus disease. CT CERVICAL SPINE FINDINGS Alignment: Cervical dextrocurvature. Straightening of the expected cervical lordosis. 2 mm C3-C4 grade 1 anterolisthesis. Fused C5-C6 grade 1 retrolisthesis. Skull base and vertebrae: The basion-dental and atlanto-dental intervals are maintained.No evidence of acute fracture to the cervical spine. Fusion across the disc space at C2-C3. Bilateral C2-C3 facet joint ankylosis also present. Soft tissues and spinal canal: No prevertebral fluid or swelling. No  visible canal hematoma. Right carotid artery stent. Disc levels: Cervical spondylosis with multilevel disc space narrowing, disc bulges, posterior disc osteophytes, endplate spurring, uncovertebral hypertrophy and facet arthrosis. Multilevel spinal canal stenosis. Most notably, there is apparent moderate spinal canal stenosis at C5-C6. Multilevel ventral osteophytes. Most notably, a bridging ventral osteophyte is present at C5-C6. Upper chest: The upper chest is excluded from the field of view. These results were called by telephone at the time of interpretation on 05/27/2021 at 12:52 pm to provider Duffy Bruce , who verbally acknowledged these results. IMPRESSION: CT head: 1. Acute parenchymal hemorrhages (3 total) within the bilateral frontal lobes with surrounding edema. The largest hemorrhage is present within the medial mid left frontal lobe, measuring 2.5 x 3.0 cm. Underlying lesions (i.e. metastases) at these sites cannot be excluded, and a contrast-enhanced brain MRI is recommended for further evaluation. 2. Trace subarachnoid hemorrhage within the posterior right sylvian fissure. 3. Chronic left basal ganglia lacunar infarct. 4. Background generalized parenchymal atrophy and cerebral white matter chronic small vessel ischemic disease. CT cervical spine: 1. No evidence of acute fracture to the cervical spine. 2. 2 mm C3-C4 grade 1 anterolisthesis. 3. Trace fused C5-C6 grade 1 retrolisthesis. 4. Cervical spondylosis, as described. 5. Fusion across the C2-C3 disc space and posterior elements. Electronically Signed   By: Kellie Simmering DO   On: 05/27/2021 12:59   MR BRAIN W WO CONTRAST  Result Date: 05/27/2021 CLINICAL DATA:  Seizure-like activity.  Multifocal hemorrhage on CT. EXAM: MRI HEAD WITHOUT AND WITH CONTRAST TECHNIQUE: Multiplanar, multiecho pulse sequences of the brain and surrounding structures were obtained without and with intravenous contrast. CONTRAST:  40mL GADAVIST GADOBUTROL 1 MMOL/ML IV  SOLN COMPARISON:  Head CT 05/27/2021 FINDINGS:  Brain: There are at least 12 enhancing supratentorial brain lesions (annotated on series 15), with a punctate thirteenth lesion questioned in the left frontal lobe (series 15, image 113). As seen on today's earlier CT, some lesions are hemorrhagic including the dominant lesion which measures 3.7 cm in the left frontal lobe with moderate surrounding edema. There is mild edema associated with to hemorrhagic lesions in the right frontal lobe, and there is also mild edema associated with lesions in the mesial right temporal lobe and left occipital lobe. No acute infarct, midline shift, or extra-axial fluid collection is identified. There is moderate cerebral atrophy. T2 hyperintensities elsewhere in the cerebral white matter bilaterally are nonspecific but compatible with mild chronic small vessel ischemic disease. There are chronic lacunar infarcts in the basal ganglia and right cerebellum. Trace subarachnoid hemorrhage is noted in the right sylvian fissure as seen on the earlier CT. Vascular: Major intracranial vascular flow voids are preserved. Skull and upper cervical spine: No suspicious marrow lesion. Sinuses/Orbits: Bilateral cataract extraction. Paranasal sinuses and mastoid air cells are clear. Other: None. IMPRESSION: 1. At least 12 enhancing supratentorial brain lesions consistent with metastases, some of which are hemorrhagic. Moderate edema associated with the dominant left frontal lesion. 2. Trace subarachnoid hemorrhage in the right Sylvian fissure. Electronically Signed   By: Logan Bores M.D.   On: 05/27/2021 19:11   CT CHEST ABDOMEN PELVIS W CONTRAST  Result Date: 05/27/2021 CLINICAL DATA:  Altered mental status.  Cough, weakness. EXAM: CT CHEST, ABDOMEN, AND PELVIS WITH CONTRAST TECHNIQUE: Multidetector CT imaging of the chest, abdomen and pelvis was performed following the standard protocol during bolus administration of intravenous contrast.  CONTRAST:  149mL OMNIPAQUE IOHEXOL 300 MG/ML  SOLN COMPARISON:  Chest x-ray earlier today.  Chest CT 11/06/2015 FINDINGS: CT CHEST FINDINGS Cardiovascular: Heart is mildly enlarged. Coronary artery and aortic calcifications. Prior CABG. No aneurysm or dissection. Mediastinum/Nodes: No mediastinal, hilar, or axillary adenopathy. Trachea and esophagus are unremarkable. Thyroid unremarkable. Lungs/Pleura: Numerous bilateral pulmonary nodules and masses. Index right lower lobe pulmonary nodule on image 129 measures 3.7 cm. Index left lower lobe pulmonary nodule measures 2.2 cm on image 91. Findings concerning for metastases. No effusions. Musculoskeletal: Bilateral gynecomastia.  No acute bony abnormality. CT ABDOMEN PELVIS FINDINGS Hepatobiliary: No focal liver abnormality is seen. Status post cholecystectomy. No biliary dilatation. Pancreas: No focal abnormality or ductal dilatation. Spleen: No focal abnormality.  Normal size. Adrenals/Urinary Tract: Mild fullness of the adrenal glands bilaterally, likely hyperplasia. No renal mass or hydronephrosis. Urinary bladder unremarkable. Stomach/Bowel: Diffuse colonic diverticulosis. No active diverticulitis. Stomach and small bowel decompressed, grossly unremarkable. Vascular/Lymphatic: Aortic atherosclerosis. No evidence of aneurysm or adenopathy. Reproductive: Prostate enlargement Other: No free fluid or free air. Musculoskeletal: No acute bony abnormality. IMPRESSION: Numerous bilateral pulmonary nodules and masses involving all lobes of both lungs with index lesions measured as above. Appearance is concerning for metastatic disease. Cardiomegaly, coronary artery disease. Aortic atherosclerosis. Colonic diverticulosis. Prostate enlargement. Electronically Signed   By: Rolm Baptise M.D.   On: 05/27/2021 19:13   DG Chest Portable 1 View  Result Date: 05/27/2021 CLINICAL DATA:  Cough.  Weakness. EXAM: PORTABLE CHEST 1 VIEW COMPARISON:  12/05/2017. FINDINGS: Prior CABG.  Cardiomegaly. Bilateral interstitial prominence. Interstitial edema and or pneumonitis cannot be excluded. Bilateral pulmonary nodular densities are noted. Chest CT can be obtained for further evaluation. No pleural effusion or pneumothorax. Biapical pleural thickening consistent scarring. No acute bony abnormality. IMPRESSION: 1. Prior CABG. Cardiomegaly. Bilateral interstitial prominence consistent with interstitial  edema. Pneumonitis cannot be excluded. 2. Multiple bilateral pulmonary nodules for which chest CT can be obtained for further evaluation. These pulmonary nodules could be infectious or secondary to metastatic disease. Electronically Signed   By: Marcello Moores  Register   On: 05/27/2021 12:37   ASSESSMENT AND PLAN:  DIEM PAGNOTTA is a 87 y.o. 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  ICH (intracerebral hemorrhage) and acute metabolic encephalopathy: --CT-head showed acute parenchymal hemorrhages and trace subarachnoid hemorrhage. Metastatic disease cannot be rulled out. Pt has hx of melanoma in the 1970's per sons -- Dr. Izora Ribas of neurosurgeon recommendations noted "May need biopsy of lung lesions if family desires it.  I would recommend against intracranial biopsy unless the other lesions are too risky - ok to mobilize- hold anticoagulation - DVT prophylaxis - ok to start tomorrow - would consider PTOT - ok to switch to q4 hour neurochecks and transfer out of unit - continue AEDs - consider dexamethasone for swelling - would consider 2 mg BID   -- Keppra 1000 mg, then 500 mg bid -hold Eliquis (per EDP,  Dr. Cari Caraway does not think we need to reverse Eliquis effect) -pt is not taking ASA or plavix   Possible seizure ([place]) -Seizure precaution -When necessary Ativan for seizure -on keppra as above   Chronic diastolic heart failure and Hx of pulm HT N:  --2D echo on 01/09/2019 showed EF of 60-65% with grade 1 diastolic dysfunction.  Patient does not have leg edema no respiratory distress.  CHF seems compensated. -Hold torsemide and spironolactone since patient has limited oral intake -Check BNP --> 149   HTN (hypertension): -IV hydralazine as needed for Bp >160 due to [place] -Cozaar and metoprolol   COPD (chronic obstructive pulmonary disease) ([place]): Stable -Bronchodilators   Elevated troponin and hx of CAD (coronary artery disease), native coronary artery: No CP.  Trop 20 -->17.  Already normalized.  Likely nonspecific. -Pravastatin   HLD (hyperlipidemia) -Pravastatin   Lung nodule: -CT-chest/abd/pelvis--Numerous bilateral pulmonary nodules and masses involving all lobes of both lungs with index lesions measured as above. Appearance is concerning for metastatic disease.   CKD (chronic kidney disease), stage IIIa: Renal function close to baseline.  Recent baseline creatinine 1.0-1.3.  His creatinine is 1.20, BUN 23   Iron deficiency anemia: Hemoglobin stable, 10.1 -Continue iron supplement   Atrial fibrillation, chronic ([name]) -Continue metoprolol -Hold Eliquis   I had a long discussion with patient's sons outside the ICU room. Patient otherwise at baseline is very independent and functional. Family understands long term poor prognosis given intracranial hemorrhage and abnormal CT chest concerning for metastatic cancer unknown primary and multiple other comorbidities. Sons request DNR DNI. Palliative care to meet with sons today to discuss goals of care.   DVT ppx: SCD Code Status: DNR/DNI--d/w sons in ICU today Family Communication:   Yes, patient's sons  at bed  side Disposition Plan: TBD Consults called: Dr. Izora Ribas Admission status and Level of care: Stepdown:  as inpt         Status is: Inpatient   Remains inpatient appropriate because:Inpatient level of care appropriate due to severity of illness   Dispo: The patient is from: Home  Anticipated d/c is to: TBD              Patient currently is not medically stable to d/c.              Difficult to place patient No    TOTAL TIME TAKING CARE OF THIS PATIENT: 35 minutes.  >50% time spent on counselling and coordination of care  Note: This dictation was prepared with Dragon dictation along with smaller phrase technology. Any transcriptional errors that result from this process are unintentional.  Fritzi Mandes M.D    [facility]   CC: Primary care physician; [name], MD Patient ID: [name], male   DOB: 02-27-1927, 87 y.o.   MRN: [id number]

## 2021-05-28 NOTE — Consult Note (Signed)
Consultation Note Date: 05/28/2021   Patient Name: Justin Leonard  DOB: 04/12/1927  MRN: [id number]  Age / Sex: 87 y.o., male  PCP: [name], MD Referring Physician: Fritzi Mandes, MD  Reason for Consultation: Establishing goals of care  HPI/Patient Profile:  Justin Leonard is a 87 y.o. 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  Clinical Assessment and Goals of Care:  Patient is sitting in bed. His 3 sons are at bedside. They state patient has been functionally independent and going shopping. They state he has had a good QOL.  We discussed his diagnosis, prognosis, GOC, EOL wishes disposition and options.  Created space and opportunity for patient  to explore thoughts and feelings regarding current medical information. They have been updated and understands the current situation. They state their mother died from stomach cancer.   A detailed discussion was had today regarding advanced directives.  Concepts specific to code status, artifical feeding and hydration, IV antibiotics and rehospitalization were discussed.  The difference between an aggressive medical intervention path and a comfort care path was discussed.  Values and goals of care important to patient and family were attempted to be elicited.  Discussed limitations of medical interventions to prolong quality of life in some situations and discussed the concept of human mortality.   Attempted to speak with patient who is extremely hard of hearing. Son  spoke with him and he became tearful but did not speak. Patient was able to do thumbs up and thumbs down though he did not speak. They state QOL is very important no matter the plan moving forward. They state he would want a natural death, no ventilator support, and no feeding tube.    Copy of living will and HPOA copied. Son [name] is his HPOA as patient's wife is dead.   I completed a MOST form today with sons and the signed original was placed in the chart. A photocopy was placed in the chart to be scanned into EMR. The patient outlined their wishes for the following treatment decisions:  Cardiopulmonary Resuscitation: Do Not Attempt Resuscitation (DNR/No CPR)  Medical Interventions: Limited Additional Interventions: Use medical treatment, IV fluids and cardiac monitoring as indicated, DO NOT USE intubation or mechanical ventilation. May consider use of less invasive airway support such as BiPAP or CPAP. Also provide comfort measures. Transfer to the hospital if indicated. Avoid intensive care.   Antibiotics: Antibiotics if indicated  IV Fluids: IV fluids if indicated  Feeding Tube: No feeding tube     SUMMARY OF RECOMMENDATIONS   Will f/u Monday.       Primary Diagnoses: Present on Admission:  ICH (intracerebral hemorrhage) ([place])  Lung nodule  Elevated troponin  CAD (coronary artery  disease), native coronary artery  Chronic diastolic heart failure ([name])  COPD (chronic obstructive pulmonary disease) ([name])  HLD (hyperlipidemia)  HTN (hypertension)  PAH (pulmonary artery hypertension) ([name])  CKD (chronic kidney disease), stage IIIa  Iron deficiency anemia  Atrial fibrillation, chronic ([name])  Acute metabolic encephalopathy   I have reviewed the medical record, interviewed the patient and family, and examined the patient. The following aspects are pertinent.  Past Medical History:  Diagnosis Date   Afib ([place])    Anemia    Arthritis    Back pain    CHF (congestive heart  failure) ([name])    COPD (chronic obstructive pulmonary disease) ([name])    Coronary artery disease    Deafness    Depression    Diabetes mellitus without complication ([name])    Dizziness    when gets up   GERD (gastroesophageal reflux disease)    Hyperlipidemia    Hypertension    Melena    Myocardial infarction ([place])    maybe a small one   Obesity    Oxygen decrease    WEARS HS,PRN   Peripheral neuropathy    Peripheral neuropathy    bil.hands and feet   Skin cancer    Umbilical hernia    Social History   Socioeconomic History   Marital status: Widowed    Spouse name: Not on file   Number of children: Not on file   Years of education: Not on file   Highest education level: Not on file  Occupational History   Not on file  Tobacco Use   Smoking status: Former    Packs/day: 3.00    Years: 30.00    Pack years: 90.00    Types: Cigarettes    Quit date: 04/16/1978    Years since quitting: 43.1   Smokeless tobacco: Never   Tobacco comments:    Quit smoking 1979  Vaping Use   Vaping Use: Never used  Substance and Sexual Activity   Alcohol use: No    Alcohol/week: 0.0 standard drinks   Drug use: No   Sexual activity: Not Currently  Other Topics Concern   Not on file  Social History Narrative   Not on file   Social Determinants of Health   Financial Resource Strain: Not on file  Food Insecurity: Not on file  Transportation Needs: Not on file  Physical Activity: Not on file  Stress: Not on file  Social Connections: Not on file   Family History  Problem Relation Age of Onset   Hypertension Father    CVA Father    Anemia Neg Hx    Arrhythmia Neg Hx    Asthma Neg Hx    Clotting disorder Neg Hx    Fainting Neg Hx    Heart attack Neg Hx    Heart disease Neg Hx    Heart failure Neg Hx    Hyperlipidemia Neg Hx    Prostate cancer Neg Hx    Bladder Cancer Neg Hx    Kidney cancer Neg Hx    Scheduled Meds:  Chlorhexidine Gluconate Cloth  6 each Topical Daily    dexamethasone (DECADRON) injection  2 mg Intravenous Q12H   ferrous sulfate  325 mg Oral BID PC   FLUoxetine  40 mg Oral Daily   insulin aspart  0-5 Units Subcutaneous QHS   insulin aspart  0-9 Units Subcutaneous TID WC   losartan  25 mg Oral Daily   metoprolol succinate  12.5 mg Oral QHS  pantoprazole  40 mg Oral Daily   pravastatin  20 mg Oral q1800   tamsulosin  0.4 mg Oral Daily   tiotropium  18 mcg Inhalation Daily   Continuous Infusions:  levETIRAcetam 500 mg (05/28/21 1050)   PRN Meds:.acetaminophen **OR** acetaminophen, albuterol, hydrALAZINE, LORazepam, nitroGLYCERIN, ondansetron (ZOFRAN) IV, polyethylene glycol Medications Prior to Admission:  Prior to Admission medications   Medication Sig Start Date End Date Taking? Authorizing Provider  acetaminophen (TYLENOL) 650 MG CR tablet Take 650 mg by mouth 2 (two) times daily as needed for pain.    Yes [provider]  ELIQUIS 2.5 MG TABS tablet Take 2.5 mg by mouth 2 (two) times daily. 02/13/21  Yes [provider]  ferrous sulfate 325 (65 FE) MG tablet Take 325 mg by mouth 2 (two) times daily.    Yes [provider]  FLUoxetine (PROZAC) 40 MG capsule Take 40 mg by mouth daily.  05/24/17  Yes [provider]  losartan (COZAAR) 25 MG tablet  03/23/20  Yes [provider]  lovastatin (MEVACOR) 20 MG tablet Take 20 mg by mouth at bedtime.   Yes [provider]  metoprolol succinate (TOPROL-XL) 25 MG 24 hr tablet Take 12.5 mg by mouth daily.    Yes [provider]  pantoprazole (PROTONIX) 40 MG tablet Take 40 mg by mouth daily.  08/26/15  Yes [provider]  Polyethylene Glycol 3350 (MIRALAX PO) Take 17 g by mouth daily.   Yes [provider]  spironolactone (ALDACTONE) 25 MG tablet  03/28/20  Yes [provider]  tamsulosin (FLOMAX) 0.4 MG CAPS capsule Take 1 capsule (0.4 mg total) by mouth daily. 10/14/20  Yes McGowan, [name], PA-C  Tiotropium  Bromide Monohydrate 2.5 MCG/ACT AERS Inhale 2 puffs into the lungs See admin instructions. Inhale 2 puffs 1 to 2 times a day.   Yes [provider]  torsemide (DEMADEX) 20 MG tablet  03/28/20  Yes [provider]  VENTOLIN HFA 108 (90 BASE) MCG/ACT inhaler Inhale 1-2 puffs into the lungs as needed for wheezing or shortness of breath. Reported on 12/16/2015 06/24/15  Yes [provider]  clopidogrel (PLAVIX) 75 MG tablet Take 1 tablet by mouth once daily Patient not taking: No sig reported 01/09/21   Algernon Huxley, MD  nitroGLYCERIN (NITROSTAT) 0.4 MG SL tablet Place 0.4 mg under the tongue every 5 (five) minutes as needed for chest pain.    [provider]  OXYGEN Place 2 L into the nose Nightly.    [provider]   Allergies  Allergen Reactions   No Known Allergies    Review of Systems  Unable to perform ROS  Physical Exam Pulmonary:     Effort: Pulmonary effort is normal.  Neurological:     Mental Status: He is alert.    Vital Signs: BP 140/72   Pulse 66   Temp 98 F (36.7 C) (Oral)   Resp (!) 22   Ht 6\' 2"  (1.88 m)   Wt 95 kg   SpO2 98%   BMI 26.89 kg/m  Pain Scale: PAINAD   Pain Score: 0-No pain   SpO2: SpO2: 98 % O2 Device:SpO2: 98 % O2 Flow Rate: .O2 Flow Rate (L/min): 6 L/min  IO: Intake/output summary:  Intake/Output Summary (Last 24 hours) at 05/28/2021 1444 Last data filed at 05/28/2021 1021 Gross per 24 hour  Intake --  Output 1200 ml  Net -1200 ml    LBM:   Baseline Weight:  Weight: 95 kg Most recent weight: Weight: 95 kg       Time In: 2:00 Time Out: 2:50 Time Total: 50 min Greater than 50%  of this time was spent counseling and coordinating care related to the above assessment and plan. Primary team was updated.   Signed by: Asencion Gowda, NP   Please contact Palliative Medicine Team phone at [phone] for questions and concerns.  For individual provider: See Shea Evans

## 2021-05-28 NOTE — Progress Notes (Signed)
Inpatient Diabetes Program Recommendations  AACE/ADA: New Consensus Statement on Inpatient Glycemic Control  Target Ranges:  Prepandial:   less than 140 mg/dL      Peak postprandial:   less than 180 mg/dL (1-2 hours)      Critically ill patients:  140 - 180 mg/dL   Results for Justin Leonard, Justin Leonard (MRN [id number]) as of 05/28/2021 10:57  Ref. Range 05/27/2021 09:06 05/28/2021 05:48  Glucose Latest Ref Range: 70 - 99 mg/dL 208 (H) 306 (H)     Review of Glycemic Control  Diabetes history: DM2 Outpatient Diabetes medications: None Current orders for Inpatient glycemic control: None; Decadron 4 mg Q12H  Inpatient Diabetes Program Recommendations:    Insulin: Lab glucose 306 mg/dl today. Please consider ordering CBGs AC&HS with Novolog 0-15 units TID with meals and Novolog 0-5 units QHS.  NOTE: Per chart review, noted patient has hx of DM2 and not on any outpatient DM medications. Noted A1C 6.7% on 11/24/20 in Care Everywhere. Patient admitted with intracerebral hemorrhage with acute metabolic encephalopathy and possible seizures. Patient is currently ordered Decadron which is contributing to hyperglycemia. Would recommend checking CBGs AC&HS and ordering Novolog correction scale.  Thanks, Barnie Alderman, RN, MSN, CDE Diabetes Coordinator Inpatient Diabetes Program [phone] (Team Pager from 8am to 5pm)

## 2021-05-28 NOTE — Progress Notes (Signed)
    Attending Progress Note  History: Justin Leonard is here for alteration of mental status with multiple intracranial lesions.  05/28/2021 No changes overnight.  Very hard of hearing.  Has no complaints.  05/27/2021 Justin Leonard is a 87 y.o. male who presents with the chief complaint of altered mental status. He normally lives with his son, but is very independent and is cognitively intact. He told his son today that he may have had a seizure.  He was not as cognitively aware as normal.  His son brought him to ER for evaluation.   He did not take his eliquis today.   The patient reports a mild headache.  He is unable to provide further history.   Of note, the patient has a smoking history and had melanoma in the past.    Physical Exam: Vitals:   05/28/21 0600 05/28/21 0700  BP: (!) 153/77 127/70  Pulse: 66 70  Resp: 17 15  Temp:    SpO2: 95% 96%   OE spontaneously FC Ox self, place, "2004" CNI R drift R UE and LE weakness 3-4/5  L side 5/5  Data:  Recent Labs  Lab 05/27/21 0906 05/28/21 0548  NA 139 136  K 3.9 4.0  CL 105 102  CO2 26 24  BUN 23 21  CREATININE 1.20 1.13  GLUCOSE 208* 306*  CALCIUM 10.2 9.8   No results for input(s): AST, ALT, ALKPHOS in the last 168 hours.  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  These are stable on repeat imaging.  - May need biopsy of lung lesions if family desires it.  I would recommend against intracranial biopsy unless the other lesions are too risky - ok to mobilize - hold anticoagulation - DVT prophylaxis - ok to start tomorrow -  would consider PTOT - ok to switch to q4 hour neurochecks and transfer out of unit - continue AEDs - consider dexamethasone for swelling - would consider 2 mg BID   Meade Maw MD, [facility] Department of Neurosurgery

## 2021-05-29 DIAGNOSIS — R911 Solitary pulmonary nodule: Secondary | ICD-10-CM

## 2021-05-29 DIAGNOSIS — R569 Unspecified convulsions: Secondary | ICD-10-CM

## 2021-05-29 DIAGNOSIS — G9341 Metabolic encephalopathy: Secondary | ICD-10-CM

## 2021-05-29 DIAGNOSIS — Z7189 Other specified counseling: Secondary | ICD-10-CM

## 2021-05-29 DIAGNOSIS — C7931 Secondary malignant neoplasm of brain: Principal | ICD-10-CM

## 2021-05-29 DIAGNOSIS — I619 Nontraumatic intracerebral hemorrhage, unspecified: Secondary | ICD-10-CM

## 2021-05-29 LAB — GLUCOSE, CAPILLARY
Glucose-Capillary: 221 mg/dL — ABNORMAL HIGH (ref 70–99)
Glucose-Capillary: 253 mg/dL — ABNORMAL HIGH (ref 70–99)
Glucose-Capillary: 283 mg/dL — ABNORMAL HIGH (ref 70–99)
Glucose-Capillary: 283 mg/dL — ABNORMAL HIGH (ref 70–99)

## 2021-05-29 MED ORDER — ENOXAPARIN SODIUM 40 MG/0.4ML IJ SOSY
40.0000 mg | PREFILLED_SYRINGE | INTRAMUSCULAR | Status: DC
  Administered 2021-05-29 – 2021-06-01 (×4): 40 mg via SUBCUTANEOUS
  Filled 2021-05-29 (×4): qty 0.4

## 2021-05-29 MED ORDER — POLYETHYLENE GLYCOL 3350 17 G PO PACK
17.0000 g | PACK | Freq: Every day | ORAL | Status: DC
  Administered 2021-05-29 – 2021-06-01 (×4): 17 g via ORAL
  Filled 2021-05-29 (×4): qty 1

## 2021-05-29 MED ORDER — LEVETIRACETAM ER 500 MG PO TB24
500.0000 mg | ORAL_TABLET | Freq: Two times a day (BID) | ORAL | Status: DC
  Administered 2021-05-29 – 2021-06-01 (×6): 500 mg via ORAL
  Filled 2021-05-29 (×7): qty 1

## 2021-05-29 NOTE — Progress Notes (Signed)
Inpatient Diabetes Program Recommendations  AACE/ADA: New Consensus Statement on Inpatient Glycemic Control (2015)  Target Ranges:  Prepandial:   less than 140 mg/dL      Peak postprandial:   less than 180 mg/dL (1-2 hours)      Critically ill patients:  140 - 180 mg/dL   Lab Results  Component Value Date   GLUCAP 221 (H) 05/29/2021   HGBA1C 7.6 (H) 05/28/2021    Review of Glycemic Control Results for [name], Justin Leonard (MRN [id number]) as of 05/29/2021 10:06  Ref. Range 05/27/2021 18:53 05/28/2021 16:17 05/28/2021 19:43 05/28/2021 21:19 05/29/2021 07:31  Glucose-Capillary Latest Ref Range: 70 - 99 mg/dL 215 (H) 301 (H) 231 (H) 207 (H) 221 (H)   Inpatient Diabetes Program Recommendations:   If appropriate: -Please consider ordering CBGs AC&HS with Novolog 0-15 units TID with meals and Novolog 0-5 units QHS.  Thank you, Nani Gasser. Reverie Vaquera, RN, MSN, CDE  Diabetes Coordinator Inpatient Glycemic Control Team Team Pager [phone] (8am-5pm) 05/29/2021 10:14 AM

## 2021-05-29 NOTE — Plan of Care (Signed)
Patient continues with expressive aphasia.  Will occasionally speak when son asks questions.  Has a very good appetite and able to feed self.  Right upper and lower extremity remain very weak and unable to hold off bed.  Family present majority of shift.  Patient with bilateral hearing aids but still unclear if he can hear.    Problem: Education: Goal: Knowledge of General Education information will improve Description: Including pain rating scale, medication(s)/side effects and non-pharmacologic comfort measures Outcome: Not Progressing   Problem: Health Behavior/Discharge Planning: Goal: Ability to manage health-related needs will improve Outcome: Not Progressing   Problem: Clinical Measurements: Goal: Ability to maintain clinical measurements within normal limits will improve Outcome: Not Progressing Goal: Will remain free from infection Outcome: Not Progressing Goal: Diagnostic test results will improve Outcome: Not Progressing Goal: Respiratory complications will improve Outcome: Not Progressing Goal: Cardiovascular complication will be avoided Outcome: Not Progressing   Problem: Activity: Goal: Risk for activity intolerance will decrease Outcome: Not Progressing   Problem: Nutrition: Goal: Adequate nutrition will be maintained Outcome: Not Progressing   Problem: Coping: Goal: Level of anxiety will decrease Outcome: Not Progressing   Problem: Elimination: Goal: Will not experience complications related to bowel motility Outcome: Not Progressing Goal: Will not experience complications related to urinary retention Outcome: Not Progressing   Problem: Pain Managment: Goal: General experience of comfort will improve Outcome: Not Progressing   Problem: Safety: Goal: Ability to remain free from injury will improve Outcome: Not Progressing   Problem: Skin Integrity: Goal: Risk for impaired skin integrity will decrease Outcome: Not Progressing

## 2021-05-29 NOTE — Progress Notes (Signed)
[facility] [place] NAME: Justin Leonard    MR#:  [id number]  DATE OF BIRTH:  11-Nov-1927  SUBJECTIVE:   son at bedside. Patient more awake. Did say a few words. Eating well. Worsening right upper and lower extremity weakness REVIEW OF SYSTEMS:   Review of Systems  Unable to perform ROS: Mental status change  Tolerating Diet:yes Tolerating PT:   DRUG ALLERGIES:   Allergies  Allergen Reactions  . No Known Allergies     VITALS:  Blood pressure (!) 152/76, pulse 81, temperature (!) 97.3 F (36.3 C), temperature source Oral, resp. rate (!) 21, height 6\' 2"  (1.88 m), weight 95 kg, SpO2 94 %.  PHYSICAL EXAMINATION:   Physical Exam  GENERAL:  87 y.o.-year-old patient lying in the bed with no acute distress.  LUNGS: Normal breath sounds bilaterally, no wheezing, rales, rhonchi. No use of accessory muscles of respiration.  CARDIOVASCULAR: S1, S2 normal. No murmurs, rubs, or gallops.  ABDOMEN: Soft, nontender, nondistended. Bowel sounds present. No organomegaly or mass.  EXTREMITIES: No cyanosis, clubbing or edema b/l.    NEUROLOGIC: right side hemiparesis 4/5. Unable to test in detail due to patient participation, expressive aphasia PSYCHIATRIC:  patient is alert and awake  SKIN:  Pressure Injury 05/27/21 Sacrum Mid Stage 2 -  Partial thickness loss of dermis presenting as a shallow open injury with a red, pink wound bed without slough. (Active)  05/27/21 1800  Location: Sacrum  Location Orientation: Mid  Staging: Stage 2 -  Partial thickness loss of dermis presenting as a shallow open injury with a red, pink wound bed without slough.  Wound Description (Comments):   Present on Admission: Yes        LABORATORY PANEL:  CBC Recent Labs  Lab 05/28/21 0548  WBC 6.8  HGB 10.7*  HCT 31.1*  PLT 170     Chemistries  Recent Labs  Lab 05/28/21 0548  NA 136  K 4.0  CL 102  CO2 24  GLUCOSE 306*  BUN 21  CREATININE 1.13   CALCIUM 9.8    Cardiac Enzymes No results for input(s): TROPONINI in the last 168 hours. RADIOLOGY:  CT HEAD WO CONTRAST  Result Date: 05/27/2021 CLINICAL DATA:  Altered mental status possible seizure EXAM: CT HEAD WITHOUT CONTRAST TECHNIQUE: Contiguous axial images were obtained from the base of the skull through the vertex without intravenous contrast. COMPARISON:  CT 05/27/2021 FINDINGS: Brain: Advanced atrophy. White matter hypodensity consistent with chronic small vessel ischemic change. Redemonstrated left frontal lobe hemorrhage with hematocrit level, this measures 2.9 by 2.8 by 3.4 cm, previously 2.5 x 2.4 x 3 cm. Increased edema within the surrounding white matter. Hemorrhage within the right parasagittal frontal lobe measuring 1 by 1 by 1 cm, previously 1 cm. Hemorrhage within the lateral right frontal lobe measuring 13 x 11 x 11 mm, previously 10 x 14 x 10 mm, grossly stable. Similar degree of surrounding edema. Trace subarachnoid hemorrhage within the right sylvian fissure as before. Chronic lacunar infarct within the left caudate. No midline shift. Stable ventricle size. Vascular: No hyperdense vessels.  Carotid vascular calcification Skull: Normal. Negative for fracture or focal lesion. Sinuses/Orbits: No acute finding. Other: None IMPRESSION: 1. Multifocal acute intraparenchymal hemorrhages within the bilateral frontal lobes with surrounding edema are again demonstrated. Largest lesion in the left frontal lobe is slightly increased in size, measuring up to 3.4 cm maximum, previously 3 cm maximum. Slight increased surrounding edema. Hemorrhage is in  the right frontal lobe are grossly unchanged. Contrast-enhanced MRI previously recommended to evaluate for underlying lesions. Trace subarachnoid hemorrhage within the right sylvian fissure is also unchanged 2. Atrophy and chronic small vessel ischemic changes of the white matter Electronically Signed   By: Donavan Foil M.D.   On: 05/27/2021  19:18   MR BRAIN W WO CONTRAST  Result Date: 05/27/2021 CLINICAL DATA:  Seizure-like activity.  Multifocal hemorrhage on CT. EXAM: MRI HEAD WITHOUT AND WITH CONTRAST TECHNIQUE: Multiplanar, multiecho pulse sequences of the brain and surrounding structures were obtained without and with intravenous contrast. CONTRAST:  52mL GADAVIST GADOBUTROL 1 MMOL/ML IV SOLN COMPARISON:  Head CT 05/27/2021 FINDINGS: Brain: There are at least 12 enhancing supratentorial brain lesions (annotated on series 15), with a punctate thirteenth lesion questioned in the left frontal lobe (series 15, image 113). As seen on today's earlier CT, some lesions are hemorrhagic including the dominant lesion which measures 3.7 cm in the left frontal lobe with moderate surrounding edema. There is mild edema associated with to hemorrhagic lesions in the right frontal lobe, and there is also mild edema associated with lesions in the mesial right temporal lobe and left occipital lobe. No acute infarct, midline shift, or extra-axial fluid collection is identified. There is moderate cerebral atrophy. T2 hyperintensities elsewhere in the cerebral white matter bilaterally are nonspecific but compatible with mild chronic small vessel ischemic disease. There are chronic lacunar infarcts in the basal ganglia and right cerebellum. Trace subarachnoid hemorrhage is noted in the right sylvian fissure as seen on the earlier CT. Vascular: Major intracranial vascular flow voids are preserved. Skull and upper cervical spine: No suspicious marrow lesion. Sinuses/Orbits: Bilateral cataract extraction. Paranasal sinuses and mastoid air cells are clear. Other: None. IMPRESSION: 1. At least 12 enhancing supratentorial brain lesions consistent with metastases, some of which are hemorrhagic. Moderate edema associated with the dominant left frontal lesion. 2. Trace subarachnoid hemorrhage in the right Sylvian fissure. Electronically Signed   By: Logan Bores M.D.   On:  05/27/2021 19:11   CT CHEST ABDOMEN PELVIS W CONTRAST  Result Date: 05/27/2021 CLINICAL DATA:  Altered mental status.  Cough, weakness. EXAM: CT CHEST, ABDOMEN, AND PELVIS WITH CONTRAST TECHNIQUE: Multidetector CT imaging of the chest, abdomen and pelvis was performed following the standard protocol during bolus administration of intravenous contrast. CONTRAST:  12mL OMNIPAQUE IOHEXOL 300 MG/ML  SOLN COMPARISON:  Chest x-ray earlier today.  Chest CT 11/06/2015 FINDINGS: CT CHEST FINDINGS Cardiovascular: Heart is mildly enlarged. Coronary artery and aortic calcifications. Prior CABG. No aneurysm or dissection. Mediastinum/Nodes: No mediastinal, hilar, or axillary adenopathy. Trachea and esophagus are unremarkable. Thyroid unremarkable. Lungs/Pleura: Numerous bilateral pulmonary nodules and masses. Index right lower lobe pulmonary nodule on image 129 measures 3.7 cm. Index left lower lobe pulmonary nodule measures 2.2 cm on image 91. Findings concerning for metastases. No effusions. Musculoskeletal: Bilateral gynecomastia.  No acute bony abnormality. CT ABDOMEN PELVIS FINDINGS Hepatobiliary: No focal liver abnormality is seen. Status post cholecystectomy. No biliary dilatation. Pancreas: No focal abnormality or ductal dilatation. Spleen: No focal abnormality.  Normal size. Adrenals/Urinary Tract: Mild fullness of the adrenal glands bilaterally, likely hyperplasia. No renal mass or hydronephrosis. Urinary bladder unremarkable. Stomach/Bowel: Diffuse colonic diverticulosis. No active diverticulitis. Stomach and small bowel decompressed, grossly unremarkable. Vascular/Lymphatic: Aortic atherosclerosis. No evidence of aneurysm or adenopathy. Reproductive: Prostate enlargement Other: No free fluid or free air. Musculoskeletal: No acute bony abnormality. IMPRESSION: Numerous bilateral pulmonary nodules and masses involving all lobes of both lungs with  index lesions measured as above. Appearance is concerning for  metastatic disease. Cardiomegaly, coronary artery disease. Aortic atherosclerosis. Colonic diverticulosis. Prostate enlargement. Electronically Signed   By: Rolm Baptise M.D.   On: 05/27/2021 19:13   ASSESSMENT AND PLAN:  Justin Leonard is a 87 y.o. 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  ICH (intracerebral hemorrhage) and acute metabolic encephalopathy: right hemiparesis --CT-head showed acute parenchymal hemorrhages and trace subarachnoid hemorrhage. Metastatic disease cannot be rulled out. Pt has hx of melanoma in the 1970's per sons -- Dr. Izora Ribas of neurosurgeon recommendations noted "May need biopsy of lung lesions if family desires it.  I would recommend against intracranial biopsy unless the other lesions are too risky - ok to mobilize- hold anticoagulation - DVT prophylaxis - ok to start tomorrow - continue AEDs---- Keppra 500 mg bid - consider dexamethasone for swelling - would consider 2 mg BID   Possible seizure ([name]) -Seizure precaution -When necessary Ativan for seizure -on keppra as above   Chronic diastolic heart failure and Hx of pulm HT N:  --2D echo on 01/09/2019 showed EF of 60-65% with grade 1 diastolic dysfunction.  Patient does not have leg edema no respiratory distress.  CHF seems compensated. -Hold torsemide and spironolactone since patient has limited oral intake -Check BNP --> 149   HTN (hypertension): -IV hydralazine as needed for Bp >160 due to [place] -Cozaar and metoprolol   COPD (chronic obstructive pulmonary disease) ([place]): Stable -Bronchodilators   Elevated troponin  and hx of CAD (coronary artery disease), native coronary artery: No CP.  Trop 20 -->17.  Already normalized.  Likely nonspecific. -Pravastatin   HLD (hyperlipidemia) -Pravastatin   metastatic lesions in the lung and brain -CT-chest/abd/pelvis--Numerous bilateral pulmonary nodules and masses involving all lobes of both lungs with index lesions measured as above. Appearance is concerning for metastatic disease. -- Case discussed with Dr. Tasia Catchings from oncology. Patient is a poor candidate for further evaluation. This was discussed by Dr. Tasia Catchings with family. Hospice is recommended   CKD (chronic kidney disease), stage IIIa: Renal function close to baseline.  Recent baseline creatinine 1.0-1.3.  His creatinine is 1.20, BUN 23   Iron deficiency anemia: Hemoglobin stable, 10.1 -Continue iron supplement   Atrial fibrillation, chronic ([name]) -Continue metoprolol -Hold Eliquis   appreciate palliative care input.  DVT ppx: SCD Code Status: DNR/DNI--d/w sons in ICU  Family Communication:   Yes, patient's sons  at bed side Disposition Plan: TBD Consults called: Dr. Izora Ribas, oncology Admission status and Level of care: MedSurg:  as inpt         Status is: Inpatient   Remains inpatient appropriate because:Inpatient level of care appropriate due to severity of illness   Dispo: The patient is from: Home              Anticipated d/c is to: TBD              Patient currently is not medically stable to d/c.              Difficult to place patient No  TOC for discharge planning  TOTAL TIME TAKING CARE OF THIS PATIENT: 25 minutes.  >50% time spent on counselling and  coordination of care  Note: This dictation was prepared with Dragon dictation along with smaller phrase technology. Any transcriptional errors that result from this process are unintentional.  Fritzi Mandes M.D    [facility]   CC: Primary care physician; [name], MD Patient ID: [name], male   DOB:  [birth year], 87 y.o.   MRN: [id number]

## 2021-05-29 NOTE — Evaluation (Signed)
Physical Therapy Evaluation Patient Details Name: Justin Leonard MRN: [id number] DOB: 02-14-1927 Today's Date: 05/29/2021   History of Present Illness  Pt is a 87 y.o. 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 CT showed "Acute parenchymal hemorrhages (3 total) within the bilateral frontal lobes with surrounding edema." and "Trace subarachnoid hemorrhage within the posterior right sylvian fissure". Follow up imaging revealed left frontal lobe lesion slightly increased in size.   Clinical Impression  Patient alert, nonverbal throughout session, occasional thumbs up. Able to follow visual or tactile commands with extended time for processing and assist for initiation as needed, pt very HOH. Seen with OT to maximize pt function, safety, and mobility. Pt with R sided weakness > L, RLE > RUE  The patient needed maxAx2 for bed mobility and sit <> Stand. Performed several times, pt able to stand statically for several minutes initially, with repetition did exhibit fatigue. TotalAX2 for weight shifts and limb advancement with attempted side steps. Vitals assessed continuously, WFLs. Bilateral knees blocked throughout, pt stabilizing on bed with posterior leg. Pt returned to supine maxAx2, all needs in reach.  Overall the patient demonstrated deficits (see "PT Problem List") that impede the patient's functional abilities, safety, and mobility and would benefit from skilled PT intervention.      Follow Up Recommendations SNF    Equipment Recommendations  Other (comment) (TBD at next venue of care)    Recommendations for  Other Services       Precautions / Restrictions Precautions Precautions: Fall Restrictions Weight Bearing Restrictions: No      Mobility  Bed Mobility Overal bed mobility: Needs Assistance Bed Mobility: Supine to Sit;Sit to Supine     Supine to sit: Max assist;+2 for physical assistance Sit to supine: Max assist;+2 for physical assistance        Transfers Overall transfer level: Needs assistance Equipment used: 2 person hand held assist;Rolling walker (2 wheeled) Transfers: Sit to/from Stand Sit to Stand: Max assist;+2 physical assistance;+2 safety/equipment         General transfer comment: R knee blocked with assist for weight shift and manual facilitation for upright posture. some instances noted for CGA-light minA once upright with knees blocked, pt stabilizing on bed rails.  Ambulation/Gait Ambulation/Gait assistance: Max assist;+2 physical assistance   Assistive device: 2 person hand held assist       General Gait Details: attempted lateral steps at EOB, totalAx2 for weight shift and limb advancement  Stairs            Wheelchair Mobility    Modified Rankin (Stroke Patients Only)       Balance Overall balance assessment: Needs assistance Sitting-balance support: Feet supported;Single extremity supported Sitting balance-Leahy Scale: Poor Sitting balance - Comments: Pt's assist level varies during session but was able to prop with assist.     Standing balance-Leahy Scale: Zero Standing balance comment: +2 assist                             Pertinent Vitals/Pain Pain Assessment: Faces Faces Pain Scale: No hurt    Home Living Family/patient expects to be discharged to:: Private residence Living Arrangements: Children Available  Help at Discharge: Family;Available PRN/intermittently Type of Home: House Home Access: Ramped entrance Entrance Stairs-Rails: Right;Left;Can reach both   Home Layout: One level Home Equipment: Shower  seat;Bedside commode;Walker - 4 wheels;Cane - single point      Prior Function Level of Independence: Independent with assistive device(s)         Comments: use of rollator at home     Hand Dominance   Dominant Hand: Right    Extremity/Trunk Assessment   Upper Extremity Assessment Upper Extremity Assessment: Defer to OT evaluation RUE Deficits / Details: unable to follow commands for formal assessment. Pt is able to active some movement to hold RW and engage bicep and triceps but overall very weak.    Lower Extremity Assessment Lower Extremity Assessment: RLE deficits/detail;LLE deficits/detail RLE Deficits / Details: unable to move against gravity without assistance. some resistance noted with movement attempts LLE Deficits / Details: able to follow commands and move AROM heel slides, ankle DP/PF toe flexion       Communication   Communication: HOH  Cognition Arousal/Alertness: Awake/alert Behavior During Therapy: Flat affect Overall Cognitive Status: Impaired/Different from baseline Area of Impairment: Following commands;Safety/judgement;Awareness                       Following Commands: Follows one step commands with increased time;Follows one step commands inconsistently Safety/Judgement: Decreased awareness of safety;Decreased awareness of deficits Awareness: Intellectual   General Comments: Pt is nonverbal during session. He will give thumbs up/down on occasion. Pt needing mod - max multimodal cuing to sequence and initiate with increased time to process.      General Comments      Exercises     Assessment/Plan    PT Assessment Patient needs continued PT services  PT Problem List Decreased strength;Decreased mobility;Decreased range of motion;Decreased knowledge of precautions;Decreased coordination;Decreased activity tolerance;Decreased balance;Decreased knowledge of use of DME;Pain       PT Treatment Interventions DME instruction;Therapeutic  exercise;Gait training;Balance training;Wheelchair mobility training;Stair training;Neuromuscular re-education;Functional mobility training;Therapeutic activities;Patient/family education    PT Goals (Current goals can be found in the Care Plan section)  Acute Rehab PT Goals Patient Stated Goal: to get stronger PT Goal Formulation: With family Time For Goal Achievement: 06/12/21 Potential to Achieve Goals: Good    Frequency Min 2X/week   Barriers to discharge        Co-evaluation PT/OT/SLP Co-Evaluation/Treatment: Yes Reason for Co-Treatment: Complexity of the patient's impairments (multi-system involvement);Necessary to address cognition/behavior during functional activity;For patient/therapist safety;To address functional/ADL transfers PT goals addressed during session: Mobility/safety with mobility;Balance OT goals addressed during session: ADL's and self-care;Strengthening/ROM       AM-PAC PT "6 Clicks" Mobility  Outcome Measure Help needed turning from your back to your side while in a flat bed without using bedrails?: Total Help needed moving from lying on your back to sitting on the side of a flat bed without using bedrails?: Total Help needed moving to and from a bed to a chair (including a wheelchair)?: Total Help needed standing up from a chair using your arms (e.g., wheelchair or bedside chair)?: Total Help needed to walk in hospital room?: Total Help needed climbing 3-5 steps with a railing? : Total 6 Click Score: 6    End of Session Equipment Utilized During Treatment: Gait belt;Oxygen Activity Tolerance: Patient tolerated treatment well;Patient limited by fatigue Patient left: in bed;with call bell/phone within reach;with bed alarm set;with family/visitor present Nurse Communication: Mobility status PT Visit Diagnosis: Other abnormalities of gait  and mobility (R26.89);Difficulty in walking, not elsewhere classified (R26.2);Muscle weakness (generalized)  (M62.81);Unsteadiness on feet (R26.81);Hemiplegia and hemiparesis Hemiplegia - Right/Left: Right Hemiplegia - dominant/non-dominant: Dominant Hemiplegia - caused by: Nontraumatic SAH;Nontraumatic intracerebral hemorrhage    Time: 6606-0045 PT Time Calculation (min) (ACUTE ONLY): 45 min   Charges:   PT Evaluation $PT Eval Moderate Complexity: 1 Mod PT Treatments $Therapeutic Activity: 8-22 mins $Neuromuscular Re-education: 8-22 mins        Lieutenant Diego PT, DPT 4:01 PM,05/29/21

## 2021-05-29 NOTE — Evaluation (Signed)
Occupational Therapy Evaluation Patient Details Name: Justin Leonard MRN: [id number] DOB: 08-08-1927 Today's Date: 05/29/2021    History of Present Illness Pt is a 87 y.o. 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 CT showed "Acute parenchymal hemorrhages (3 total) within the bilateral frontal lobes with surrounding edema." and "Trace subarachnoid hemorrhage within the posterior right sylvian fissure". Follow up imaging revealed left frontal lobe lesion slightly increased in size.   Clinical Impression   Patient presenting with decreased I in self care,balance, functional mobility/transfer, endurance, and safety awareness. Patient non verbal this session but son present to confirm baseline. Pt lives in home with son who works during the day. Pt was still driving some, performing his own IADLs, and ambulating with occasional use of rollator. Pt would cook himself breakfast and overall caring for himself. Pt seen with PT for skilled co-tx to address functional mobility and self care safety. Pt needing max A of 2 for bed mobility and sit <>stand x 3 reps. Pt standing for several minutes each time with manual facilitation for upright position, weight shift, and 2 lateral steps with total of 2 to facilitate with R knee blocked. Pt does have BM when standing and assist of third person to assist with hygiene and linen change while pt standing. Pt with inattention noted to the R side but does scan room with cuing to do so. Significant weakness in R UE but pt unable to follow formal assessment. Patient will benefit from acute OT to  increase overall independence in the areas of ADLs, functional mobility, and safety awareness in order to safely discharge to next venue of care.    Follow Up Recommendations  SNF;Supervision/Assistance - 24 hour    Equipment Recommendations  Other (comment) (defer to next venue of care)       Precautions / Restrictions Precautions Precautions: Fall Restrictions Weight Bearing Restrictions: No      Mobility Bed Mobility Overal bed mobility: Needs Assistance Bed Mobility: Supine to Sit;Sit to Supine     Supine to sit: Max assist;+2 for physical assistance Sit to supine: Max assist;+2 for physical assistance        Transfers Overall transfer level: Needs assistance Equipment used: 2 person hand held assist;Rolling walker (2 wheeled) Transfers: Sit to/from Stand Sit to Stand: Max assist;+2 physical assistance;+2 safety/equipment         General transfer comment: R knee blocked with assist for weight shift and manual facilitation for upright posture    Balance Overall balance assessment: Needs assistance Sitting-balance support: Feet supported;Single extremity supported Sitting balance-Leahy Scale: Poor Sitting balance - Comments: Pt's assist level varies during session but was able to prop with assist.     Standing balance-Leahy Scale: Zero Standing balance comment: +2 assist                           ADL either performed or assessed with clinical judgement   ADL Overall ADL's : Needs assistance/impaired                     Lower Body Dressing: Total assistance;Bed level     Toilet Transfer Details (indicate cue type  and reason): deferred for safety                 Vision Baseline Vision/History: Wears glasses Wears Glasses: At all times Additional Comments: unable to follow formal assessment but does visually track in the room with cuing. Pt displaying inattention to the R side.            Pertinent Vitals/Pain Pain  Assessment: Faces Faces Pain Scale: No hurt     Hand Dominance Right   Extremity/Trunk Assessment Upper Extremity Assessment Upper Extremity Assessment: RUE deficits/detail RUE Deficits / Details: unable to follow commands for formal assessment. Pt is able to active some movement to hold RW and engage bicep and triceps but overall very weak.   Lower Extremity Assessment Lower Extremity Assessment: Defer to PT evaluation       Communication Communication Communication: HOH   Cognition Arousal/Alertness: Awake/alert Behavior During Therapy: Flat affect Overall Cognitive Status: Impaired/Different from baseline Area of Impairment: Following commands;Safety/judgement;Awareness                       Following Commands: Follows one step commands with increased time;Follows one step commands inconsistently Safety/Judgement: Decreased awareness of safety;Decreased awareness of deficits Awareness: Intellectual   General Comments: Pt is nonverbal during session. He will give thumbs up/down on occasion. Pt needing mod - max multimodal cuing to sequence and initiate with increased time to process.              Home Living Family/patient expects to be discharged to:: Private residence Living Arrangements: Children Available Help at Discharge: Family;Available PRN/intermittently Type of Home: House Home Access: Ramped entrance   Entrance Stairs-Rails: Right;Left;Can reach both Home Layout: One level     Bathroom Shower/Tub: Walk-in shower         Home Equipment: Shower seat;Bedside commode;Walker - 4 wheels;Cane - single point          Prior Functioning/Environment Level of Independence: Independent with assistive device(s)        Comments: use of rollator at home        OT Problem List: Decreased strength;Decreased range of motion;Decreased activity tolerance;Impaired balance (sitting and/or standing);Decreased safety awareness;Impaired  sensation;Decreased cognition;Impaired UE functional use;Decreased knowledge of precautions;Decreased coordination;Decreased knowledge of use of DME or AE      OT Treatment/Interventions: Self-care/ADL training;Manual therapy;Therapeutic exercise;Modalities;Patient/family education;Balance training;Neuromuscular education;Energy conservation;Therapeutic activities;DME and/or AE instruction;Cognitive remediation/compensation    OT Goals(Current goals can be found in the care plan section) Acute Rehab OT Goals Patient Stated Goal: to get stronger OT Goal Formulation: With patient/family Time For Goal Achievement: 06/12/21 Potential to Achieve Goals: Good ADL Goals Pt Will Perform Grooming: with min assist Pt Will Perform Upper Body Dressing: with min assist Pt Will Perform Lower Body Dressing: with mod assist Pt Will Transfer to Toilet: with mod assist Pt Will Perform Toileting - Clothing Manipulation and hygiene: with mod assist  OT Frequency: Min 2X/week   Barriers to D/C:    none known at this time       Co-evaluation PT/OT/SLP Co-Evaluation/Treatment: Yes Reason for Co-Treatment: Complexity of the patient's impairments (multi-system involvement);Necessary to address cognition/behavior during functional activity;For patient/therapist safety;To address functional/ADL transfers PT goals addressed during session: Mobility/safety with mobility;Balance OT goals addressed during session: ADL's and self-care;Strengthening/ROM      AM-PAC OT "6 Clicks" Daily Activity     Outcome Measure Help from another person eating meals?: A Little Help from another person taking care of personal grooming?: A  Little Help from another person toileting, which includes using toliet, bedpan, or urinal?: Total Help from another person bathing (including washing, rinsing, drying)?: A Lot Help from another person to put on and taking off regular upper body clothing?: A Lot Help from another person to put on  and taking off regular lower body clothing?: Total 6 Click Score: 12   End of Session Equipment Utilized During Treatment: Rolling walker Nurse Communication: Mobility status;Precautions  Activity Tolerance: Patient tolerated treatment well Patient left: in bed;with bed alarm set;with call bell/phone within reach;with family/visitor present  OT Visit Diagnosis: Unsteadiness on feet (R26.81);Muscle weakness (generalized) (M62.81);Hemiplegia and hemiparesis Hemiplegia - Right/Left: Right Hemiplegia - dominant/non-dominant: Dominant                Time: 8099-8338 OT Time Calculation (min): 45 min Charges:  OT General Charges $OT Visit: 1 Visit OT Evaluation $OT Eval High Complexity: 1 High OT Treatments $Therapeutic Activity: 8-22 mins  Darleen Crocker, MS, OTR/L , CBIS ascom [phone]  05/29/21, 3:04 PM

## 2021-05-29 NOTE — Consult Note (Signed)
Hematology/Oncology Consult note [facility] [phone] [phone]  Patient Care Team: [name], MD as PCP - General [name], MD as Consulting Physician (Cardiology) Alisa Graff, FNP as Nurse Practitioner (Family Medicine)   Name of the patient: Justin Leonard  [id number]  06/06/1927   Date of visit: 05/29/21 REASON FOR COSULTATION:  Multiple lung nodules and brain mass History of presenting illness-  87 y.o. male with PMH listed at below who presents to ER for evaluation of decreased functional mobility,  Altered mental status, possible seizure. In the emergency room, CT of the head showed intracranial bleeding.  MRI brain showed at least 12 enhancing supra tentorial brain lesions consistent with metastasis.  Some of which are hemorrhagic.  Moderate edema associated with the dominant left frontal lesion.  Trace subarachnoid hemorrhage in the right sylvian fissure.  Patient was admitted and was seen by neurosurgery recommend conservative management.  Started on steroid and seizure medication.  Eliquis was discontinued.  CT cervical spine showed no evidence of acute fracture.  CT chest abdomen pelvis showed numerous bilateral pulmonary nodules and masses involving all lobes of both lungs.  Concerning for metastatic disease.  Cardiomegaly, CAD, atherosclerosis, chronic diverticulosis.  Prostate enlargement. Hematology oncology was consulted for family discussion of possible diagnosis and management plan. Per patient's son, patient was previously active for his age, alert and orientated.  Per family, patient has history of melanoma his face in 44s..  Patient is not able to hold a meaningful conversation due to neurologic deficit from intracranial bleeding.  Review of Systems  Unable to perform ROS: Mental status change   Allergies  Allergen Reactions   No Known Allergies     Patient Active Problem List   Diagnosis  Date Noted   Pressure injury of skin 05/28/2021   ICH (intracerebral hemorrhage) ([place]) 05/27/2021   Seizure ([place]) 05/27/2021   Lung nodule 05/27/2021   CKD (chronic kidney disease), stage IIIa 05/27/2021   Iron deficiency anemia 05/27/2021   Atrial fibrillation, chronic ([place]) 99/35/7017   Acute metabolic encephalopathy 79/39/0300   Carotid stenosis, bilateral 07/12/2019   Symptomatic carotid artery stenosis 07/03/2019   GERD (gastroesophageal reflux disease) 01/08/2019   Atrial fibrillation with RVR ([place]) 01/08/2019   Chest pain 12/05/2017   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   Epigastric hernia    PAH (pulmonary artery hypertension) ([place]) 10/13/2016   Abdominal pain, bilateral upper quadrant 05/09/2016   Diabetic hypoglycemia ([place]) 10/13/2015   Elevated troponin 07/15/2015   Chronic diastolic heart failure ([place]) 04/17/2015   HTN (hypertension) 04/17/2015   Diabetes ([place]) 04/17/2015   COPD (chronic obstructive pulmonary disease) ([place]) 04/17/2015   Clinical depression 08/23/2012   Melanoma in situ ([place]) 08/23/2012   Exomphalos 08/23/2012   Major depressive disorder with single episode 08/23/2012   Difficulty hearing 01/18/2012   HLD (hyperlipidemia) 01/18/2012   Peripheral nerve disease 01/18/2012   Adiposity 01/18/2012   CAD (coronary artery disease), native coronary artery 01/18/2012     Past Medical History:  Diagnosis Date   Afib ([name])    Anemia    Arthritis    Back pain    CHF (congestive heart failure) ([name])    COPD (chronic obstructive pulmonary disease) ([place])    Coronary artery disease    Deafness    Depression    Diabetes mellitus without complication ([place])    Dizziness    when gets up   GERD (gastroesophageal reflux disease)    Hyperlipidemia    Hypertension  Melena    Myocardial infarction [facility])    maybe a small one   Obesity    Oxygen decrease    WEARS HS,PRN   Peripheral neuropathy    Peripheral neuropathy    bil.hands and feet   Skin cancer     Umbilical hernia      Past Surgical History:  Procedure Laterality Date   CARDIAC CATHETERIZATION Left 07/17/2015   Procedure: Right/Left Heart Cath and Coronary Angiography;  Surgeon: [name], MD;  Location: [place] [facility];  Service: Cardiovascular;  Laterality: Left;   CARDIAC CATHETERIZATION N/A 07/17/2015   Procedure: Coronary Stent Intervention;  Surgeon: [name], MD;  Location: [place] [facility];  Service: Cardiovascular;  Laterality: N/A;   CARDIAC CATHETERIZATION Right 05/10/2016   Procedure: Left Heart Cath and Coronary Angiography;  Surgeon: [name], MD;  Location: [place] [facility];  Service: Cardiovascular;  Laterality: Right;   CAROTID PTA/STENT INTERVENTION Right 07/12/2019   Procedure: CAROTID PTA/STENT INTERVENTION;  Surgeon: Algernon Huxley, MD;  Location: [facility];  Service: Cardiovascular;  Laterality: Right;   CHOLECYSTECTOMY N/A 11/04/2016   Procedure: LAPAROSCOPIC CHOLECYSTECTOMY, POSSIBLE OPEN umbilical hernia repair;  Surgeon: [name], MD;  Location: [facility];  Service: General;  Laterality: N/A;   COLONOSCOPY WITH PROPOFOL N/A 12/12/2015   Procedure: COLONOSCOPY WITH PROPOFOL;  Surgeon: [name], MD;  Location: [facility];  Service: Gastroenterology;  Laterality: N/A;   CORONARY ANGIOPLASTY     stent 8/16   CORONARY ARTERY BYPASS GRAFT  1994   ESOPHAGOGASTRODUODENOSCOPY N/A 11/10/2015   Procedure: ESOPHAGOGASTRODUODENOSCOPY (EGD);  Surgeon: [name], MD;  Location: [facility] ENDOSCOPY;  Service: Endoscopy;  Laterality: N/A;   EYE SURGERY     gall bladder drain     MOHS SURGERY      Social History   Socioeconomic History   Marital status: Widowed    Spouse name: Not on file   Number of children: Not on file   Years of education: Not on file   Highest education level: Not on file  Occupational History   Not on file  Tobacco Use   Smoking status: Former    Packs/day: 3.00    Years: 30.00    Pack years:  90.00    Types: Cigarettes    Quit date: 04/16/1978    Years since quitting: 43.1   Smokeless tobacco: Never   Tobacco comments:    Quit smoking 1979  Vaping Use   Vaping Use: Never used  Substance and Sexual Activity   Alcohol use: No    Alcohol/week: 0.0 standard drinks   Drug use: No   Sexual activity: Not Currently  Other Topics Concern   Not on file  Social History Narrative   Not on file   Social Determinants of Health   Financial Resource Strain: Not on file  Food Insecurity: Not on file  Transportation Needs: Not on file  Physical Activity: Not on file  Stress: Not on file  Social Connections: Not on file  Intimate Partner Violence: Not on file     Family History  Problem Relation Age of Onset   Hypertension Father    CVA Father    Anemia Neg Hx    Arrhythmia Neg Hx    Asthma Neg Hx    Clotting disorder Neg Hx    Fainting Neg Hx    Heart attack Neg Hx    Heart disease Neg Hx  Heart failure Neg Hx    Hyperlipidemia Neg Hx    Prostate cancer Neg Hx    Bladder Cancer Neg Hx    Kidney cancer Neg Hx      Current Facility-Administered Medications:    acetaminophen (TYLENOL) tablet 650 mg, 650 mg, Oral, Q6H PRN **OR** acetaminophen (TYLENOL) suppository 650 mg, 650 mg, Rectal, Q6H PRN, Ivor Costa, MD   albuterol (PROVENTIL) (2.5 MG/3ML) 0.083% nebulizer solution 2.5 mg, 2.5 mg, Nebulization, Q4H PRN, Ivor Costa, MD   Chlorhexidine Gluconate Cloth 2 % PADS 6 each, 6 each, Topical, Daily, Ivor Costa, MD, 6 each at 05/28/21 2110   dexamethasone (DECADRON) injection 2 mg, 2 mg, Intravenous, Q12H, Fritzi Mandes, MD, 2 mg at 05/29/21 0549   enoxaparin (LOVENOX) injection 40 mg, 40 mg, Subcutaneous, Q24H, Fritzi Mandes, MD, 40 mg at 05/29/21 1153   ferrous sulfate tablet 325 mg, 325 mg, Oral, BID PC, Ivor Costa, MD, 325 mg at 05/29/21 0917   FLUoxetine (PROZAC) capsule 40 mg, 40 mg, Oral, Daily, Ivor Costa, MD, 40 mg at 05/29/21 9622   hydrALAZINE (APRESOLINE)  injection 10 mg, 10 mg, Intravenous, Q4H PRN, Fritzi Mandes, MD   insulin aspart (novoLOG) injection 0-5 Units, 0-5 Units, Subcutaneous, QHS, Fritzi Mandes, MD, 2 Units at 05/28/21 2120   insulin aspart (novoLOG) injection 0-9 Units, 0-9 Units, Subcutaneous, TID WC, Fritzi Mandes, MD, 5 Units at 05/29/21 1153   levETIRAcetam (KEPPRA XR) 24 hr tablet 500 mg, 500 mg, Oral, BID, Fritzi Mandes, MD   LORazepam (ATIVAN) injection 1 mg, 1 mg, Intravenous, Q2H PRN, Ivor Costa, MD, 1 mg at 05/27/21 1622   losartan (COZAAR) tablet 25 mg, 25 mg, Oral, Daily, Ivor Costa, MD, 25 mg at 05/29/21 2979   metoprolol succinate (TOPROL-XL) 24 hr tablet 12.5 mg, 12.5 mg, Oral, QHS, Ivor Costa, MD, 12.5 mg at 05/28/21 2106   nitroGLYCERIN (NITROSTAT) SL tablet 0.4 mg, 0.4 mg, Sublingual, Q5 min PRN, Ivor Costa, MD   ondansetron (ZOFRAN) injection 4 mg, 4 mg, Intravenous, Q8H PRN, Ivor Costa, MD   pantoprazole (PROTONIX) EC tablet 40 mg, 40 mg, Oral, Daily, Ivor Costa, MD, 40 mg at 05/29/21 0917   polyethylene glycol (MIRALAX / GLYCOLAX) packet 17 g, 17 g, Oral, Daily PRN, Ivor Costa, MD   polyethylene glycol (MIRALAX / GLYCOLAX) packet 17 g, 17 g, Oral, Daily, Fritzi Mandes, MD, 17 g at 05/29/21 1153   pravastatin (PRAVACHOL) tablet 20 mg, 20 mg, Oral, q1800, Ivor Costa, MD, 20 mg at 05/28/21 1622   tamsulosin (FLOMAX) capsule 0.4 mg, 0.4 mg, Oral, Daily, Ivor Costa, MD, 0.4 mg at 05/29/21 0917   tiotropium (SPIRIVA) inhalation capsule ([facility] use ONLY) 18 mcg, 18 mcg, Inhalation, Daily, Ivor Costa, MD, 18 mcg at 05/29/21 0916   Physical exam:  Vitals:   05/29/21 0800 05/29/21 1000 05/29/21 1133 05/29/21 1200  BP: (!) 161/90 (!) 154/76  (!) 152/76  Pulse: 84 89  81  Resp: (!) 25 18  (!) 21  Temp: (!) 97.2 F (36.2 C)  (!) 97.3 F (36.3 C)   TempSrc: Oral  Oral   SpO2: 91% 93%  94%  Weight:      Height:       Physical Exam Constitutional:      General: He is not in acute distress.    Appearance: He is not  diaphoretic.     Comments: Patient is lying in the bed  HENT:     Head: Normocephalic and atraumatic.  Nose: Nose normal.     Mouth/Throat:     Pharynx: No oropharyngeal exudate.  Eyes:     General: No scleral icterus.    Pupils: Pupils are equal, round, and reactive to light.  Cardiovascular:     Rate and Rhythm: Normal rate and regular rhythm.     Heart sounds: No murmur heard. Pulmonary:     Effort: Pulmonary effort is normal. No respiratory distress.     Breath sounds: No rales.  Chest:     Chest wall: No tenderness.  Abdominal:     General: There is no distension.     Palpations: Abdomen is soft.     Tenderness: There is no abdominal tenderness.  Musculoskeletal:        General: Normal range of motion.     Cervical back: Normal range of motion and neck supple.  Skin:    General: Skin is warm and dry.     Findings: No erythema.  Neurological:     Mental Status: He is alert.     Motor: No abnormal muscle tone.     Comments: Expressive aphasia.  Right-sided weakness  Psychiatric:        Mood and Affect: Affect normal.        CMP Latest Ref Rng & Units 05/28/2021  Glucose 70 - 99 mg/dL 306(H)  BUN 8 - 23 mg/dL 21  Creatinine 0.61 - 1.24 mg/dL 1.13  Sodium 135 - 145 mmol/L 136  Potassium 3.5 - 5.1 mmol/L 4.0  Chloride 98 - 111 mmol/L 102  CO2 22 - 32 mmol/L 24  Calcium 8.9 - 10.3 mg/dL 9.8  Total Protein 6.5 - 8.1 g/dL -  Total Bilirubin 0.3 - 1.2 mg/dL -  Alkaline Phos 38 - 126 U/L -  AST 15 - 41 U/L -  ALT 0 - 44 U/L -   CBC Latest Ref Rng & Units 05/28/2021  WBC 4.0 - 10.5 K/uL 6.8  Hemoglobin 13.0 - 17.0 g/dL 10.7(L)  Hematocrit 39.0 - 52.0 % 31.1(L)  Platelets 150 - 400 K/uL 170    RADIOGRAPHIC STUDIES: I have personally reviewed the radiological images as listed and agreed with the findings in the report. CT HEAD WO CONTRAST  Result Date: 05/27/2021 CLINICAL DATA:  Altered mental status possible seizure EXAM: CT HEAD WITHOUT CONTRAST TECHNIQUE:  Contiguous axial images were obtained from the base of the skull through the vertex without intravenous contrast. COMPARISON:  CT 05/27/2021 FINDINGS: Brain: Advanced atrophy. White matter hypodensity consistent with chronic small vessel ischemic change. Redemonstrated left frontal lobe hemorrhage with hematocrit level, this measures 2.9 by 2.8 by 3.4 cm, previously 2.5 x 2.4 x 3 cm. Increased edema within the surrounding white matter. Hemorrhage within the right parasagittal frontal lobe measuring 1 by 1 by 1 cm, previously 1 cm. Hemorrhage within the lateral right frontal lobe measuring 13 x 11 x 11 mm, previously 10 x 14 x 10 mm, grossly stable. Similar degree of surrounding edema. Trace subarachnoid hemorrhage within the right sylvian fissure as before. Chronic lacunar infarct within the left caudate. No midline shift. Stable ventricle size. Vascular: No hyperdense vessels.  Carotid vascular calcification Skull: Normal. Negative for fracture or focal lesion. Sinuses/Orbits: No acute finding. Other: None IMPRESSION: 1. Multifocal acute intraparenchymal hemorrhages within the bilateral frontal lobes with surrounding edema are again demonstrated. Largest lesion in the left frontal lobe is slightly increased in size, measuring up to 3.4 cm maximum, previously 3 cm maximum. Slight increased surrounding edema. Hemorrhage is  in the right frontal lobe are grossly unchanged. Contrast-enhanced MRI previously recommended to evaluate for underlying lesions. Trace subarachnoid hemorrhage within the right sylvian fissure is also unchanged 2. Atrophy and chronic small vessel ischemic changes of the white matter Electronically Signed   By: Donavan Foil M.D.   On: 05/27/2021 19:18   CT Head Wo Contrast  Result Date: 05/27/2021 CLINICAL DATA:  Delirium. EXAM: CT HEAD WITHOUT CONTRAST CT CERVICAL SPINE WITHOUT CONTRAST TECHNIQUE: Multidetector CT imaging of the head and cervical spine was performed following the standard  protocol without intravenous contrast. Multiplanar CT image reconstructions of the cervical spine were also generated. COMPARISON:  No pertinent prior exams available for comparison. FINDINGS: CT HEAD FINDINGS Brain: Moderate generalized cerebral atrophy. Comparatively mild cerebellar atrophy. 2.5 x 2.4 x 3.0 cm acute parenchymal hemorrhage within the medial mid left frontal lobe with mild to moderate surrounding edema. 1 cm acute parenchymal hemorrhage within the medial mid right frontal lobe with mild surrounding edema (series 2, image 22). 1.0 x 1.4 x 1.0 within the lateral right frontal lobe with mild surrounding edema (series 2, image 17). Trace acute subarachnoid hemorrhage along the posterior aspect of the right sylvian fissure (series 2, image 14). Chronic lacunar infarct within the left caudate nucleus. Background mild ill-defined hypoattenuation within the cerebral white matter, nonspecific but compatible with chronic small vessel ischemic disease. No demarcated cortical infarct. No hydrocephalus or midline shift. Vascular: No hyperdense vessel.  Atherosclerotic calcifications. Skull: Normal. Negative for fracture or focal lesion. Sinuses/Orbits: Visualized orbits show no acute finding. No significant paranasal sinus disease. CT CERVICAL SPINE FINDINGS Alignment: Cervical dextrocurvature. Straightening of the expected cervical lordosis. 2 mm C3-C4 grade 1 anterolisthesis. Fused C5-C6 grade 1 retrolisthesis. Skull base and vertebrae: The basion-dental and atlanto-dental intervals are maintained.No evidence of acute fracture to the cervical spine. Fusion across the disc space at C2-C3. Bilateral C2-C3 facet joint ankylosis also present. Soft tissues and spinal canal: No prevertebral fluid or swelling. No visible canal hematoma. Right carotid artery stent. Disc levels: Cervical spondylosis with multilevel disc space narrowing, disc bulges, posterior disc osteophytes, endplate spurring, uncovertebral  hypertrophy and facet arthrosis. Multilevel spinal canal stenosis. Most notably, there is apparent moderate spinal canal stenosis at C5-C6. Multilevel ventral osteophytes. Most notably, a bridging ventral osteophyte is present at C5-C6. Upper chest: The upper chest is excluded from the field of view. These results were called by telephone at the time of interpretation on 05/27/2021 at 12:52 pm to provider Duffy Bruce , who verbally acknowledged these results. IMPRESSION: CT head: 1. Acute parenchymal hemorrhages (3 total) within the bilateral frontal lobes with surrounding edema. The largest hemorrhage is present within the medial mid left frontal lobe, measuring 2.5 x 3.0 cm. Underlying lesions (i.e. metastases) at these sites cannot be excluded, and a contrast-enhanced brain MRI is recommended for further evaluation. 2. Trace subarachnoid hemorrhage within the posterior right sylvian fissure. 3. Chronic left basal ganglia lacunar infarct. 4. Background generalized parenchymal atrophy and cerebral white matter chronic small vessel ischemic disease. CT cervical spine: 1. No evidence of acute fracture to the cervical spine. 2. 2 mm C3-C4 grade 1 anterolisthesis. 3. Trace fused C5-C6 grade 1 retrolisthesis. 4. Cervical spondylosis, as described. 5. Fusion across the C2-C3 disc space and posterior elements. Electronically Signed   By: Kellie Simmering DO   On: 05/27/2021 12:59   CT CERVICAL SPINE WO CONTRAST  Result Date: 05/27/2021 CLINICAL DATA:  Delirium. EXAM: CT HEAD WITHOUT CONTRAST CT CERVICAL SPINE WITHOUT CONTRAST  TECHNIQUE: Multidetector CT imaging of the head and cervical spine was performed following the standard protocol without intravenous contrast. Multiplanar CT image reconstructions of the cervical spine were also generated. COMPARISON:  No pertinent prior exams available for comparison. FINDINGS: CT HEAD FINDINGS Brain: Moderate generalized cerebral atrophy. Comparatively mild cerebellar atrophy.  2.5 x 2.4 x 3.0 cm acute parenchymal hemorrhage within the medial mid left frontal lobe with mild to moderate surrounding edema. 1 cm acute parenchymal hemorrhage within the medial mid right frontal lobe with mild surrounding edema (series 2, image 22). 1.0 x 1.4 x 1.0 within the lateral right frontal lobe with mild surrounding edema (series 2, image 17). Trace acute subarachnoid hemorrhage along the posterior aspect of the right sylvian fissure (series 2, image 14). Chronic lacunar infarct within the left caudate nucleus. Background mild ill-defined hypoattenuation within the cerebral white matter, nonspecific but compatible with chronic small vessel ischemic disease. No demarcated cortical infarct. No hydrocephalus or midline shift. Vascular: No hyperdense vessel.  Atherosclerotic calcifications. Skull: Normal. Negative for fracture or focal lesion. Sinuses/Orbits: Visualized orbits show no acute finding. No significant paranasal sinus disease. CT CERVICAL SPINE FINDINGS Alignment: Cervical dextrocurvature. Straightening of the expected cervical lordosis. 2 mm C3-C4 grade 1 anterolisthesis. Fused C5-C6 grade 1 retrolisthesis. Skull base and vertebrae: The basion-dental and atlanto-dental intervals are maintained.No evidence of acute fracture to the cervical spine. Fusion across the disc space at C2-C3. Bilateral C2-C3 facet joint ankylosis also present. Soft tissues and spinal canal: No prevertebral fluid or swelling. No visible canal hematoma. Right carotid artery stent. Disc levels: Cervical spondylosis with multilevel disc space narrowing, disc bulges, posterior disc osteophytes, endplate spurring, uncovertebral hypertrophy and facet arthrosis. Multilevel spinal canal stenosis. Most notably, there is apparent moderate spinal canal stenosis at C5-C6. Multilevel ventral osteophytes. Most notably, a bridging ventral osteophyte is present at C5-C6. Upper chest: The upper chest is excluded from the field of view.  These results were called by telephone at the time of interpretation on 05/27/2021 at 12:52 pm to provider Duffy Bruce , who verbally acknowledged these results. IMPRESSION: CT head: 1. Acute parenchymal hemorrhages (3 total) within the bilateral frontal lobes with surrounding edema. The largest hemorrhage is present within the medial mid left frontal lobe, measuring 2.5 x 3.0 cm. Underlying lesions (i.e. metastases) at these sites cannot be excluded, and a contrast-enhanced brain MRI is recommended for further evaluation. 2. Trace subarachnoid hemorrhage within the posterior right sylvian fissure. 3. Chronic left basal ganglia lacunar infarct. 4. Background generalized parenchymal atrophy and cerebral white matter chronic small vessel ischemic disease. CT cervical spine: 1. No evidence of acute fracture to the cervical spine. 2. 2 mm C3-C4 grade 1 anterolisthesis. 3. Trace fused C5-C6 grade 1 retrolisthesis. 4. Cervical spondylosis, as described. 5. Fusion across the C2-C3 disc space and posterior elements. Electronically Signed   By: Kellie Simmering DO   On: 05/27/2021 12:59   MR BRAIN W WO CONTRAST  Result Date: 05/27/2021 CLINICAL DATA:  Seizure-like activity.  Multifocal hemorrhage on CT. EXAM: MRI HEAD WITHOUT AND WITH CONTRAST TECHNIQUE: Multiplanar, multiecho pulse sequences of the brain and surrounding structures were obtained without and with intravenous contrast. CONTRAST:  46mL GADAVIST GADOBUTROL 1 MMOL/ML IV SOLN COMPARISON:  Head CT 05/27/2021 FINDINGS: Brain: There are at least 12 enhancing supratentorial brain lesions (annotated on series 15), with a punctate thirteenth lesion questioned in the left frontal lobe (series 15, image 113). As seen on today's earlier CT, some lesions are hemorrhagic including the dominant  lesion which measures 3.7 cm in the left frontal lobe with moderate surrounding edema. There is mild edema associated with to hemorrhagic lesions in the right frontal lobe, and there  is also mild edema associated with lesions in the mesial right temporal lobe and left occipital lobe. No acute infarct, midline shift, or extra-axial fluid collection is identified. There is moderate cerebral atrophy. T2 hyperintensities elsewhere in the cerebral white matter bilaterally are nonspecific but compatible with mild chronic small vessel ischemic disease. There are chronic lacunar infarcts in the basal ganglia and right cerebellum. Trace subarachnoid hemorrhage is noted in the right sylvian fissure as seen on the earlier CT. Vascular: Major intracranial vascular flow voids are preserved. Skull and upper cervical spine: No suspicious marrow lesion. Sinuses/Orbits: Bilateral cataract extraction. Paranasal sinuses and mastoid air cells are clear. Other: None. IMPRESSION: 1. At least 12 enhancing supratentorial brain lesions consistent with metastases, some of which are hemorrhagic. Moderate edema associated with the dominant left frontal lesion. 2. Trace subarachnoid hemorrhage in the right Sylvian fissure. Electronically Signed   By: Logan Bores M.D.   On: 05/27/2021 19:11   CT CHEST ABDOMEN PELVIS W CONTRAST  Result Date: 05/27/2021 CLINICAL DATA:  Altered mental status.  Cough, weakness. EXAM: CT CHEST, ABDOMEN, AND PELVIS WITH CONTRAST TECHNIQUE: Multidetector CT imaging of the chest, abdomen and pelvis was performed following the standard protocol during bolus administration of intravenous contrast. CONTRAST:  128mL OMNIPAQUE IOHEXOL 300 MG/ML  SOLN COMPARISON:  Chest x-ray earlier today.  Chest CT 11/06/2015 FINDINGS: CT CHEST FINDINGS Cardiovascular: Heart is mildly enlarged. Coronary artery and aortic calcifications. Prior CABG. No aneurysm or dissection. Mediastinum/Nodes: No mediastinal, hilar, or axillary adenopathy. Trachea and esophagus are unremarkable. Thyroid unremarkable. Lungs/Pleura: Numerous bilateral pulmonary nodules and masses. Index right lower lobe pulmonary nodule on image 129  measures 3.7 cm. Index left lower lobe pulmonary nodule measures 2.2 cm on image 91. Findings concerning for metastases. No effusions. Musculoskeletal: Bilateral gynecomastia.  No acute bony abnormality. CT ABDOMEN PELVIS FINDINGS Hepatobiliary: No focal liver abnormality is seen. Status post cholecystectomy. No biliary dilatation. Pancreas: No focal abnormality or ductal dilatation. Spleen: No focal abnormality.  Normal size. Adrenals/Urinary Tract: Mild fullness of the adrenal glands bilaterally, likely hyperplasia. No renal mass or hydronephrosis. Urinary bladder unremarkable. Stomach/Bowel: Diffuse colonic diverticulosis. No active diverticulitis. Stomach and small bowel decompressed, grossly unremarkable. Vascular/Lymphatic: Aortic atherosclerosis. No evidence of aneurysm or adenopathy. Reproductive: Prostate enlargement Other: No free fluid or free air. Musculoskeletal: No acute bony abnormality. IMPRESSION: Numerous bilateral pulmonary nodules and masses involving all lobes of both lungs with index lesions measured as above. Appearance is concerning for metastatic disease. Cardiomegaly, coronary artery disease. Aortic atherosclerosis. Colonic diverticulosis. Prostate enlargement. Electronically Signed   By: Rolm Baptise M.D.   On: 05/27/2021 19:13   DG Chest Portable 1 View  Result Date: 05/27/2021 CLINICAL DATA:  Cough.  Weakness. EXAM: PORTABLE CHEST 1 VIEW COMPARISON:  12/05/2017. FINDINGS: Prior CABG. Cardiomegaly. Bilateral interstitial prominence. Interstitial edema and or pneumonitis cannot be excluded. Bilateral pulmonary nodular densities are noted. Chest CT can be obtained for further evaluation. No pleural effusion or pneumothorax. Biapical pleural thickening consistent scarring. No acute bony abnormality. IMPRESSION: 1. Prior CABG. Cardiomegaly. Bilateral interstitial prominence consistent with interstitial edema. Pneumonitis cannot be excluded. 2. Multiple bilateral pulmonary nodules for  which chest CT can be obtained for further evaluation. These pulmonary nodules could be infectious or secondary to metastatic disease. Electronically Signed   By: Marcello Moores  Register  On: 05/27/2021 12:37    Assessment and plan-  Clinically metastatic brain lesions as well as multiple metastatic lung lesions. No obvious primary site on CT/MRI. History of melanoma.  Possible metastatic melanoma or other metastatic disease. Intracranial bleeding/metastasis, prognosis is poor. I had a lengthy discussion with patient and his son Leroy Sea at the bedside, and with [name] on the phone Tissue diagnosis needs to be done for confirmation of cancer diagnosis, histology prior to treatments. With patient's current functional status, it is likely that he may not be able to tolerate aggressive chemotherapy treatments.  He has COPD and has high risk of complications associated with biopsy procedures-bronchoscopy versus CT-guided percutaneous biopsies. I think proceeding with hospice/is a reasonable option.  Family leaning towards hospice and would further discussed with patient before deciding.   Thank you for allowing me to participate in the care of this patient.   Earlie Server, MD, PhD Hematology Oncology [facility] at [facility] Pager- [phone] 05/29/2021

## 2021-05-30 DIAGNOSIS — N1831 Chronic kidney disease, stage 3a: Secondary | ICD-10-CM

## 2021-05-30 DIAGNOSIS — I482 Chronic atrial fibrillation, unspecified: Secondary | ICD-10-CM

## 2021-05-30 LAB — GLUCOSE, CAPILLARY
Glucose-Capillary: 280 mg/dL — ABNORMAL HIGH (ref 70–99)
Glucose-Capillary: 274 mg/dL — ABNORMAL HIGH (ref 70–99)
Glucose-Capillary: 273 mg/dL — ABNORMAL HIGH (ref 70–99)

## 2021-05-30 MED ORDER — INSULIN GLARGINE 100 UNIT/ML ~~LOC~~ SOLN
10.0000 [IU] | Freq: Every day | SUBCUTANEOUS | Status: DC
  Administered 2021-05-30 – 2021-05-31 (×2): 10 [IU] via SUBCUTANEOUS
  Filled 2021-05-30 (×2): qty 0.1

## 2021-05-30 MED ORDER — INSULIN ASPART 100 UNIT/ML IJ SOLN
0.0000 [IU] | Freq: Three times a day (TID) | INTRAMUSCULAR | Status: DC
  Administered 2021-05-30 – 2021-05-31 (×3): 5 [IU] via SUBCUTANEOUS
  Administered 2021-05-31: 13:00:00 7 [IU] via SUBCUTANEOUS
  Administered 2021-06-01: 12:00:00 5 [IU] via SUBCUTANEOUS
  Administered 2021-06-01: 3 [IU] via SUBCUTANEOUS
  Filled 2021-05-30 (×5): qty 1

## 2021-05-30 MED ORDER — INSULIN ASPART 100 UNIT/ML IJ SOLN
0.0000 [IU] | Freq: Every day | INTRAMUSCULAR | Status: DC
Start: 2021-05-30 — End: 2021-06-01
  Administered 2021-05-31: 23:00:00 2 [IU] via SUBCUTANEOUS
  Filled 2021-05-30: qty 1

## 2021-05-30 MED ORDER — SODIUM CHLORIDE 0.9% FLUSH
3.0000 mL | INTRAVENOUS | Status: DC | PRN
  Administered 2021-05-30: 3 mL via INTRAVENOUS

## 2021-05-30 NOTE — Plan of Care (Signed)
VSS.  Afib with controlled rates noted on telemetry. No changes to neuro status.  Remains aphasic, nonverbal but follows commands and eyes able to track.  Right side flaccid.   Problem: Education: Goal: Knowledge of General Education information will improve Description: Including pain rating scale, medication(s)/side effects and non-pharmacologic comfort measures Outcome: Not Progressing   Problem: Health Behavior/Discharge Planning: Goal: Ability to manage health-related needs will improve Outcome: Not Progressing   Problem: Clinical Measurements: Goal: Ability to maintain clinical measurements within normal limits will improve Outcome: Not Progressing Goal: Will remain free from infection Outcome: Not Progressing Goal: Diagnostic test results will improve Outcome: Not Progressing Goal: Respiratory complications will improve Outcome: Not Progressing Goal: Cardiovascular complication will be avoided Outcome: Not Progressing   Problem: Activity: Goal: Risk for activity intolerance will decrease Outcome: Not Progressing   Problem: Nutrition: Goal: Adequate nutrition will be maintained Outcome: Not Progressing   Problem: Coping: Goal: Level of anxiety will decrease Outcome: Not Progressing   Problem: Elimination: Goal: Will not experience complications related to bowel motility Outcome: Not Progressing Goal: Will not experience complications related to urinary retention Outcome: Not Progressing   Problem: Pain Managment: Goal: General experience of comfort will improve Outcome: Not Progressing   Problem: Safety: Goal: Ability to remain free from injury will improve Outcome: Not Progressing   Problem: Skin Integrity: Goal: Risk for impaired skin integrity will decrease Outcome: Not Progressing

## 2021-05-30 NOTE — NC FL2 (Signed)
[place] LEVEL OF CARE SCREENING TOOL     IDENTIFICATION  Patient Name: Justin Leonard Birthdate: [birth year] Sex: male Admission Date (Current Location): 05/27/2021  [facility] and [place] Number:  Engineering geologist and Address:  [facility], [address] [place], [place] [address]      Provider Number:    Attending Physician Name and Address:  Fritzi Mandes, MD  Relative Name and Phone Number:  Kari Kerth [phone] Oley Balm [phone]    Current Level of Care: SNF Recommended Level of Care: [place] Prior Approval Number:    Date Approved/Denied:   PASRR Number: [id number]  Discharge Plan: SNF    Current Diagnoses: Patient Active Problem List   Diagnosis Date Noted   Brain metastases [facility])    New onset seizure ([place])    Goals of care, counseling/discussion    Pressure injury of skin 05/28/2021   ICH (intracerebral hemorrhage) ([place]) 05/27/2021   Seizure ([place]) 05/27/2021   Lung nodule 05/27/2021   CKD (chronic kidney disease), stage IIIa 05/27/2021   Iron deficiency anemia 05/27/2021   Atrial fibrillation, chronic ([place]) 30/16/0109   Acute metabolic encephalopathy 32/35/5732   Carotid stenosis, bilateral 07/12/2019   Symptomatic carotid artery stenosis 07/03/2019   GERD (gastroesophageal reflux disease) 01/08/2019   Atrial fibrillation with RVR ([place]) 01/08/2019   Chest pain 12/05/2017   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   Epigastric hernia    PAH (pulmonary artery hypertension) ([place]) 10/13/2016   Abdominal pain, bilateral upper quadrant 05/09/2016   Diabetic hypoglycemia ([place]) 10/13/2015   Elevated troponin 07/15/2015   Chronic diastolic heart failure ([place]) 04/17/2015   HTN (hypertension) 04/17/2015   Diabetes ([place]) 04/17/2015   COPD (chronic obstructive pulmonary disease) ([place]) 04/17/2015   Clinical depression 08/23/2012   Melanoma in situ ([place]) 08/23/2012   Exomphalos  08/23/2012   Major depressive disorder with single episode 08/23/2012   Difficulty hearing 01/18/2012   HLD (hyperlipidemia) 01/18/2012   Peripheral nerve disease 01/18/2012   Adiposity 01/18/2012   CAD (coronary artery disease), native coronary artery 01/18/2012    Orientation RESPIRATION BLADDER Height & Weight     Self, Situation  Normal Incontinent Weight: 214 lb 4.6 oz (97.2 kg) Height:  6\' 2"  (188 cm)  BEHAVIORAL SYMPTOMS/MOOD NEUROLOGICAL BOWEL NUTRITION STATUS      Incontinent Diet  AMBULATORY STATUS COMMUNICATION OF NEEDS Skin   Extensive Assist Non-Verbally (non-verbal, uses hand gestures-) Skin abrasions                       Personal Care Assistance Level of Assistance  Bathing, Feeding, Dressing Bathing Assistance: Maximum assistance Feeding assistance: Limited assistance Dressing Assistance: Maximum assistance     Functional Limitations Info  Sight, Hearing, Speech Sight Info: Adequate (wears glasses) Hearing Info: Impaired Speech Info: Impaired    SPECIAL CARE FACTORS FREQUENCY  PT (By licensed PT), OT (By licensed OT)     PT Frequency: 5x per week OT Frequency: 5x per week            Contractures Contractures Info: Not present    Additional Factors Info  Code Status Code Status Info: DNR             Current Medications (05/30/2021):  This is the current hospital active medication list Current Facility-Administered Medications  Medication Dose Route Frequency Provider Last Rate Last Admin   acetaminophen (TYLENOL) tablet 650 mg  650 mg Oral Q6H PRN Ivor Costa, MD  Or   acetaminophen (TYLENOL) suppository 650 mg  650 mg Rectal Q6H PRN Ivor Costa, MD       albuterol (PROVENTIL) (2.5 MG/3ML) 0.083% nebulizer solution 2.5 mg  2.5 mg Nebulization Q4H PRN Ivor Costa, MD       Chlorhexidine Gluconate Cloth 2 % PADS 6 each  6 each Topical Daily Ivor Costa, MD   6 each at 05/29/21 2126   dexamethasone (DECADRON) injection 2 mg  2 mg  Intravenous Q12H Fritzi Mandes, MD   2 mg at 05/30/21 0559   enoxaparin (LOVENOX) injection 40 mg  40 mg Subcutaneous Q24H Fritzi Mandes, MD   40 mg at 05/30/21 1225   ferrous sulfate tablet 325 mg  325 mg Oral BID PC Ivor Costa, MD   325 mg at 05/30/21 0905   FLUoxetine (PROZAC) capsule 40 mg  40 mg Oral Daily Ivor Costa, MD   40 mg at 05/30/21 4008   hydrALAZINE (APRESOLINE) injection 10 mg  10 mg Intravenous Q4H PRN Fritzi Mandes, MD       insulin aspart (novoLOG) injection 0-5 Units  0-5 Units Subcutaneous QHS Fritzi Mandes, MD       [START ON 05/31/2021] insulin aspart (novoLOG) injection 0-9 Units  0-9 Units Subcutaneous TID WC Fritzi Mandes, MD       insulin glargine (LANTUS) injection 10 Units  10 Units Subcutaneous Daily Fritzi Mandes, MD       levETIRAcetam (KEPPRA XR) 24 hr tablet 500 mg  500 mg Oral BID Fritzi Mandes, MD   500 mg at 05/30/21 0904   LORazepam (ATIVAN) injection 1 mg  1 mg Intravenous Q2H PRN Ivor Costa, MD   1 mg at 05/27/21 1622   losartan (COZAAR) tablet 25 mg  25 mg Oral Daily Ivor Costa, MD   25 mg at 05/30/21 6761   metoprolol succinate (TOPROL-XL) 24 hr tablet 12.5 mg  12.5 mg Oral QHS Ivor Costa, MD   12.5 mg at 05/29/21 2121   nitroGLYCERIN (NITROSTAT) SL tablet 0.4 mg  0.4 mg Sublingual Q5 min PRN Ivor Costa, MD       ondansetron [facility]) injection 4 mg  4 mg Intravenous Q8H PRN Ivor Costa, MD       pantoprazole (PROTONIX) EC tablet 40 mg  40 mg Oral Daily Ivor Costa, MD   40 mg at 05/30/21 0905   polyethylene glycol (MIRALAX / GLYCOLAX) packet 17 g  17 g Oral Daily PRN Ivor Costa, MD       polyethylene glycol (MIRALAX / GLYCOLAX) packet 17 g  17 g Oral Daily Fritzi Mandes, MD   17 g at 05/30/21 0903   pravastatin (PRAVACHOL) tablet 20 mg  20 mg Oral q1800 Ivor Costa, MD   20 mg at 05/29/21 1656   sodium chloride flush (NS) 0.9 % injection 3 mL  3 mL Intravenous PRN Fritzi Mandes, MD   3 mL at 05/30/21 0601   tamsulosin (FLOMAX) capsule 0.4 mg  0.4 mg Oral Daily Ivor Costa, MD    0.4 mg at 05/30/21 0905   tiotropium [facility]) inhalation capsule ([facility] use ONLY) 18 mcg  18 mcg Inhalation Daily Ivor Costa, MD   18 mcg at 05/30/21 9509     Discharge Medications: Please see discharge summary for a list of discharge medications.  Relevant Imaging Results:  Relevant Lab Results:   Additional Information Soc Sec [id number]  Elliot Gurney [place], [place]

## 2021-05-30 NOTE — TOC Initial Note (Addendum)
Transition of Care [facility]) - Initial/Assessment Note    Patient Details  Name: Justin Leonard MRN: [id number] Date of Birth: [birth year]  Transition of Care [facility]) CM/SW Contact:    Elliot Gurney [place], [place] Phone Number:[phone] 05/30/2021, 5:24 PM  Clinical Narrative:                 Patient is a 87 year old male, admitted due to altered mental status. This Education officer, museum met with patient and his son Justin Leonard at bedside. Patient's son Justin Leonard his the POA and was contacted but his VM was full, unable to leave a message. Patient resides with son Justin Leonard, has oxygen in the home as well as a walker. Per patient's son , patient was independent and active prior to admission. Patient's PCP is  Dr. Kym Leonard with [facility] and he obtains is medications from [facility].   Recommendation for SNF with Palliative care discussed. SNF process discussed, FL2 completed and faxed out for bed offers. This Education officer, museum will follow up with patient and his family to discuss bed choice once received.   [address], LCSW Transition of Care [phone]   Expected Discharge Plan: Skilled Nursing Facility Barriers to Discharge: Continued Medical Work up   Patient Goals and CMS Choice     Choice offered to / list presented to : Adult Children  Expected Discharge Plan and Services Expected Discharge Plan: [place] In-house Referral: Clinical Social Work   Post Acute Care Choice: [place] arrangements for the past 2 months: [place]                                      Prior Living Arrangements/Services Living arrangements for the past 2 months: Single Family Home Lives with:: Adult Children Patient language and need for interpreter reviewed:: Yes (patient non-verbal at this time, spoke with son Justin Leonard) Do you feel safe going back to the place where you live?: Yes (post SNF stay)      Need for Family Participation in Patient Care: Yes  (Comment) Care giver support system in place?: Yes (comment) Current home services: DME (oxygen) Criminal Activity/Legal Involvement Pertinent to Current Situation/Hospitalization: No - Comment as needed  Activities of Daily Living      Permission Sought/Granted Permission sought to share information with : Family Supports             Permission granted to share info w Contact Information: Doug Birnbaum-[phone]  Emotional Assessment Appearance:: Appears stated age Attitude/Demeanor/Rapport: Unable to Assess Affect (typically observed): Blunt Orientation: : Oriented to Self, Oriented to Place Alcohol / Substance Use: Not Applicable Psych Involvement: No (comment)  Admission diagnosis:  Brain metastases ([place]) [C79.31] ICH (intracerebral hemorrhage) ([place]) [I61.9] New onset seizure ([place]) [R56.9] Intraparenchymal hematoma of brain, left, without loss of consciousness, initial encounter [facility]) [S06.350A] Patient Active Problem List   Diagnosis Date Noted   Brain metastases ([place])    New onset seizure ([place])    Goals of care, counseling/discussion    Pressure injury of skin 05/28/2021   ICH (intracerebral hemorrhage) ([place]) 05/27/2021   Seizure ([place]) 05/27/2021   Lung nodule 05/27/2021   CKD (chronic kidney disease), stage IIIa 05/27/2021   Iron deficiency anemia 05/27/2021   Atrial fibrillation, chronic ([place]) 87/86/7672   Acute metabolic encephalopathy 09/47/0962   Carotid stenosis, bilateral 07/12/2019   Symptomatic carotid artery stenosis 07/03/2019   GERD (gastroesophageal reflux disease)  01/08/2019   Atrial fibrillation with RVR ([place]) 01/08/2019   Chest pain 12/05/2017   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   Epigastric hernia    PAH (pulmonary artery hypertension) ([place]) 10/13/2016   Abdominal pain, bilateral upper quadrant 05/09/2016   Diabetic hypoglycemia ([place]) 10/13/2015   Elevated troponin 07/15/2015   Chronic diastolic heart failure ([place]) 04/17/2015   HTN  (hypertension) 04/17/2015   Diabetes ([place]) 04/17/2015   COPD (chronic obstructive pulmonary disease) ([place]) 04/17/2015   Clinical depression 08/23/2012   Melanoma in situ ([place]) 08/23/2012   Exomphalos 08/23/2012   Major depressive disorder with single episode 08/23/2012   Difficulty hearing 01/18/2012   HLD (hyperlipidemia) 01/18/2012   Peripheral nerve disease 01/18/2012   Adiposity 01/18/2012   CAD (coronary artery disease), native coronary artery 01/18/2012   PCP:  Justin Castle, MD Pharmacy:   [facility] [address] [place] [address] Phone: [phone] Fax: [phone]     Social Determinants of Health (SDOH) Interventions    Readmission Risk Interventions No flowsheet data found.

## 2021-05-30 NOTE — Progress Notes (Signed)
Patient noted with an occasional cough this evening when drinking at times.  Showing less interest with meals and when food is placed in mouth he seems to just roll it around some before spitting it back out.  Son at bedside with concern of greater facial droop and not wanting to utilize left hand at this time to feed self as he has been.  Informed that I didn't see any change with face but would inform MD and place a speech consult to have them follow up with swallowing and appropriate diet.  Will continue to monitor.

## 2021-05-30 NOTE — Progress Notes (Signed)
[facility] [place] NAME: Justin Leonard    MR#:  [id number]  DATE OF BIRTH:  1927/04/08  SUBJECTIVE:   son at bedside. Patient more awake. Did say a few words. Eating well. Worsening right upper and lower extremity weakness Sugars up REVIEW OF SYSTEMS:   Review of Systems  Unable to perform ROS: Mental status change  Tolerating Diet:yes Tolerating PT:   DRUG ALLERGIES:   Allergies  Allergen Reactions  . No Known Allergies     VITALS:  Blood pressure (!) 159/90, pulse 94, temperature 97.9 F (36.6 C), temperature source Axillary, resp. rate (!) 21, height 6\' 2"  (1.88 m), weight 97.2 kg, SpO2 92 %.  PHYSICAL EXAMINATION:   Physical Exam  GENERAL:  87 y.o.-year-old patient lying in the bed with no acute distress.  LUNGS: Normal breath sounds bilaterally, no wheezing, rales, rhonchi. No use of accessory muscles of respiration.  CARDIOVASCULAR: S1, S2 normal. No murmurs, rubs, or gallops.  ABDOMEN: Soft, nontender, nondistended. Bowel sounds present. No organomegaly or mass.  EXTREMITIES: No cyanosis, clubbing or edema b/l.    NEUROLOGIC: right side hemiparesis 3/5. Unable to test in detail due to patient participation, expressive aphasia PSYCHIATRIC:  patient is alert and awake  SKIN:  Pressure Injury 05/27/21 Sacrum Mid Stage 2 -  Partial thickness loss of dermis presenting as a shallow open injury with a red, pink wound bed without slough. (Active)  05/27/21 1800  Location: Sacrum  Location Orientation: Mid  Staging: Stage 2 -  Partial thickness loss of dermis presenting as a shallow open injury with a red, pink wound bed without slough.  Wound Description (Comments):   Present on Admission: Yes        LABORATORY PANEL:  CBC Recent Labs  Lab 05/28/21 0548  WBC 6.8  HGB 10.7*  HCT 31.1*  PLT 170     Chemistries  Recent Labs  Lab 05/28/21 0548  NA 136  K 4.0  CL 102  CO2 24  GLUCOSE 306*  BUN 21   CREATININE 1.13  CALCIUM 9.8    Cardiac Enzymes No results for input(s): TROPONINI in the last 168 hours. RADIOLOGY:  No results found. ASSESSMENT AND PLAN:  Justin Leonard is a 87 y.o. 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  ICH (intracerebral hemorrhage) and acute metabolic encephalopathy: right hemiparesis --CT-head showed acute parenchymal hemorrhages and trace subarachnoid hemorrhage. Metastatic disease cannot be rulled out. Pt has hx of melanoma in the 1970's per sons -- Dr. Izora Ribas of neurosurgeon recommendations noted "May need biopsy of lung lesions if family desires it.  I would recommend against intracranial biopsy unless the other lesions are too risky - ok to mobilize- hold anticoagulation - DVT prophylaxis - ok to start tomorrow - continue AEDs---- Keppra 500 mg bid - cont dexamethasone for brain edema - 2 mg BID   Possible seizure ([name]) -Seizure precaution -When necessary Ativan for seizure -on keppra as above   Chronic diastolic heart failure and Hx of pulm HT N:  --2D echo on 01/09/2019 showed EF of 60-65% with grade 1 diastolic dysfunction.  Patient does not have leg edema no respiratory distress.  CHF seems compensated. -Hold torsemide and spironolactone since patient has limited oral intake -Check BNP --> 149   HTN (hypertension): -IV hydralazine as needed for Bp >160 due to [place] -Cozaar and metoprolol   COPD (chronic obstructive pulmonary disease) ([place]): Stable -Bronchodilators   Elevated troponin and hx of CAD (coronary artery disease), native coronary artery: No CP.   Trop 20 -->17.  Already normalized.  Likely nonspecific. -Pravastatin   HLD (hyperlipidemia) -Pravastatin   metastatic lesions in the lung and brain -CT-chest/abd/pelvis--Numerous bilateral pulmonary nodules and masses involving all lobes of both lungs with index lesions measured as above. Appearance is concerning for metastatic disease. -- Case discussed with Dr. Tasia Catchings from oncology. Patient is a poor candidate for further evaluation. This was discussed by Dr. Tasia Catchings with family. Hospice is recommended   CKD (chronic kidney disease), stage IIIa with Dm-2 and HTN : Renal function close to baseline.  Recent baseline creatinine 1.0-1.3.  His creatinine is 1.20, BUN 23   Iron deficiency anemia: Hemoglobin stable, 10.1 -Continue iron supplement   Atrial fibrillation, chronic ([name]) -Continue metoprolol -Hold Eliquis  Dm-2,uncontrolled, hyperglycemia --SSI and Lantus   appreciate palliative care input. TOC for d/c planning to rehab. Pt was very independent and active prior to admission.  DVT ppx: SCD Code Status: DNR/DNI Family Communication:   Yes, patient's sons  at bed side Disposition Plan: TBD Consults called: Dr. Izora Ribas, oncology Admission status and Level of care: MedSurg:  as inpt         Status is: Inpatient   Remains inpatient appropriate because:Inpatient level of care appropriate due to severity of illness   Dispo: The patient is from: Home              Anticipated d/c is to: TBD              Patient currently is medically optimized stable to d/c.              Difficult to place patient No  TOC for discharge planning  TOTAL TIME TAKING CARE OF THIS PATIENT: 25 minutes.  >50% time spent on counselling and coordination of care  Note: This dictation was prepared with Dragon dictation along with smaller phrase technology. Any transcriptional errors that result from this process are unintentional.  Justin Leonard M.D    [facility]   CC: Primary care  physician; [name], MD Patient ID: Justin Leonard, male   DOB: [birth year], 87 y.o.   MRN: [id number]

## 2021-05-31 LAB — GLUCOSE, CAPILLARY
Glucose-Capillary: 286 mg/dL — ABNORMAL HIGH (ref 70–99)
Glucose-Capillary: 265 mg/dL — ABNORMAL HIGH (ref 70–99)
Glucose-Capillary: 308 mg/dL — ABNORMAL HIGH (ref 70–99)
Glucose-Capillary: 257 mg/dL — ABNORMAL HIGH (ref 70–99)
Glucose-Capillary: 236 mg/dL — ABNORMAL HIGH (ref 70–99)
Glucose-Capillary: 262 mg/dL — ABNORMAL HIGH (ref 70–99)

## 2021-05-31 MED ORDER — INSULIN GLARGINE 100 UNIT/ML ~~LOC~~ SOLN
15.0000 [IU] | Freq: Two times a day (BID) | SUBCUTANEOUS | Status: DC
  Administered 2021-05-31: 15 [IU] via SUBCUTANEOUS
  Filled 2021-05-31 (×3): qty 0.15

## 2021-05-31 MED ORDER — INSULIN GLARGINE 100 UNIT/ML ~~LOC~~ SOLN
10.0000 [IU] | Freq: Two times a day (BID) | SUBCUTANEOUS | Status: DC
  Filled 2021-05-31: qty 0.1

## 2021-05-31 MED ORDER — DEXAMETHASONE 4 MG PO TABS
2.0000 mg | ORAL_TABLET | Freq: Two times a day (BID) | ORAL | Status: DC
  Administered 2021-05-31 – 2021-06-01 (×3): 2 mg via ORAL
  Filled 2021-05-31 (×3): qty 1

## 2021-05-31 NOTE — Progress Notes (Signed)
[facility] NAME: Zakhari Fogel    MR#:  [id number]  DATE OF BIRTH:  1927/03/05  SUBJECTIVE:   son at bedside. Patient more awake. Aphasia  Eating well. Worsening right upper and lower extremity weakness Sugars up REVIEW OF SYSTEMS:   Review of Systems  Unable to perform ROS: Mental status change  Tolerating Diet:yes Tolerating PT:   DRUG ALLERGIES:   Allergies  Allergen Reactions  . No Known Allergies     VITALS:  Blood pressure (!) 169/77, pulse 87, temperature 97.7 F (36.5 C), resp. rate 20, height 6\' 2"  (1.88 m), weight 97.2 kg, SpO2 97 %.  PHYSICAL EXAMINATION:   Physical Exam  GENERAL:  87 y.o.-year-old patient lying in the bed with no acute distress.  LUNGS: Normal breath sounds bilaterally, no wheezing, rales, rhonchi. No use of accessory muscles of respiration.  CARDIOVASCULAR: S1, S2 normal. No murmurs, rubs, or gallops.  ABDOMEN: Soft, nontender, nondistended. Bowel sounds present. No organomegaly or mass.  EXTREMITIES: No cyanosis, clubbing or edema b/l.    NEUROLOGIC: right side hemiparesis 2/5. Unable to test in detail due to patient participation, expressive aphasia PSYCHIATRIC:  patient is alert and awake  SKIN:  Pressure Injury 05/27/21 Sacrum Mid Stage 2 -  Partial thickness loss of dermis presenting as a shallow open injury with a red, pink wound bed without slough. (Active)  05/27/21 1800  Location: Sacrum  Location Orientation: Mid  Staging: Stage 2 -  Partial thickness loss of dermis presenting as a shallow open injury with a red, pink wound bed without slough.  Wound Description (Comments):   Present on Admission: Yes        LABORATORY PANEL:  CBC Recent Labs  Lab 05/28/21 0548  WBC 6.8  HGB 10.7*  HCT 31.1*  PLT 170     Chemistries  Recent Labs  Lab 05/28/21 0548  NA 136  K 4.0  CL 102  CO2 24  GLUCOSE 306*  BUN 21  CREATININE 1.13  CALCIUM 9.8    Cardiac  Enzymes No results for input(s): TROPONINI in the last 168 hours. RADIOLOGY:  No results found. ASSESSMENT AND PLAN:  DRAGON THRUSH is a 87 y.o. 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  ICH (intracerebral hemorrhage) and acute metabolic encephalopathy: right hemiparesis --CT-head showed acute parenchymal hemorrhages and trace subarachnoid hemorrhage. Metastatic disease cannot be rulled out. Pt has hx of melanoma in the 1970's per sons -- Dr. Izora Ribas of neurosurgeon recommendations noted "May need biopsy of lung lesions if family desires it.  I would recommend against intracranial biopsy unless the other lesions are too risky - ok to mobilize- hold anticoagulation" - DVT prophylaxis - ok to start  - continue AEDs---- Keppra 500 mg bid - cont dexamethasone for brain edema - 2 mg BID   Possible seizure ([name]) -Seizure precaution -When necessary Ativan for seizure -on keppra as above   Chronic diastolic heart failure and Hx of pulm HT N:  --2D echo on 01/09/2019 showed EF of 60-65% with grade 1 diastolic dysfunction.  Patient does not have leg edema no respiratory distress.  CHF seems compensated. -Hold torsemide and spironolactone  since patient has limited oral intake -Check BNP --> 149   HTN (hypertension): -IV hydralazine as needed for Bp >160 due to Hissop -Cozaar and metoprolol   COPD (chronic obstructive pulmonary disease) ([place]): Stable -Bronchodilators   Elevated troponin and hx of CAD (coronary artery disease), native coronary artery: No CP.  Trop 20 -->17.  Already normalized.  Likely  nonspecific. -Pravastatin   HLD (hyperlipidemia) -Pravastatin   metastatic lesions in the lung and brain -CT-chest/abd/pelvis--Numerous bilateral pulmonary nodules and masses involving all lobes of both lungs with index lesions measured as above. Appearance is concerning for metastatic disease. -- Case discussed with Dr. Tasia Catchings from oncology. Patient is a poor candidate for further evaluation. This was discussed by Dr. Tasia Catchings with family. Hospice is recommended   CKD (chronic kidney disease), stage IIIa with Dm-2 and HTN : Renal function close to baseline.  Recent baseline creatinine 1.0-1.3.  His creatinine is 1.20, BUN 23   Iron deficiency anemia: Hemoglobin stable, 10.1 -Continue iron supplement   Atrial fibrillation, chronic ([name]) -Continue metoprolol -Hold Eliquis  Dm-2,uncontrolled, hyperglycemia, steroids --SSI and Lantus   appreciate palliative care input. TOC for d/c planning to rehab. Pt was very independent and active prior to admission.  DVT ppx: SCD Code Status: DNR/DNI Family Communication:   Yes, patient's son  at bed side Disposition Plan: TBD Consults called: Dr. Izora Ribas, oncology Admission status and Level of care: MedSurg:  as inpt         Status is: Inpatient   Dispo: The patient is from: Home              Anticipated d/c is to: TBD              Patient currently is medically optimized stable to d/c.              Difficult to place patient No  TOC for discharge planning--awaiting SNF bed offers  TOTAL TIME TAKING CARE OF THIS PATIENT: 25 minutes.  >50% time spent on counselling and coordination of care  Note: This dictation was prepared with Dragon dictation along with smaller phrase technology. Any transcriptional errors that result from this process are unintentional.  Fritzi Mandes M.D    [facility]   CC: Primary care physician; [name], MD Patient ID: [name], male   DOB: 09/27/1927, 87 y.o.   MRN: [id number]

## 2021-06-01 DIAGNOSIS — Z515 Encounter for palliative care: Secondary | ICD-10-CM

## 2021-06-01 LAB — BASIC METABOLIC PANEL WITH GFR
Anion gap: 11 (ref 5–15)
BUN: 26 mg/dL — ABNORMAL HIGH (ref 8–23)
CO2: 21 mmol/L — ABNORMAL LOW (ref 22–32)
Calcium: 10.1 mg/dL (ref 8.9–10.3)
Chloride: 100 mmol/L (ref 98–111)
Creatinine, Ser: 1.1 mg/dL (ref 0.61–1.24)
GFR, Estimated: >60 mL/min
Glucose, Bld: 250 mg/dL — ABNORMAL HIGH (ref 70–99)
Potassium: 4.1 mmol/L (ref 3.5–5.1)
Sodium: 132 mmol/L — ABNORMAL LOW (ref 135–145)

## 2021-06-01 LAB — GLUCOSE, CAPILLARY
Glucose-Capillary: 231 mg/dL — ABNORMAL HIGH (ref 70–99)
Glucose-Capillary: 275 mg/dL — ABNORMAL HIGH (ref 70–99)

## 2021-06-01 MED ORDER — MORPHINE SULFATE (PF) 2 MG/ML IV SOLN: 1.0000 mg | INTRAVENOUS | Status: DC | PRN

## 2021-06-01 MED ORDER — MORPHINE SULFATE (CONCENTRATE) 10 MG/0.5ML PO SOLN: 5.0000 mg | ORAL | Status: DC | PRN

## 2021-06-01 MED ORDER — INSULIN GLARGINE 100 UNIT/ML ~~LOC~~ SOLN
25.0000 [IU] | Freq: Two times a day (BID) | SUBCUTANEOUS | Status: DC
  Administered 2021-06-01: 12:00:00 25 [IU] via SUBCUTANEOUS
  Filled 2021-06-01 (×2): qty 0.25

## 2021-06-01 NOTE — Care Management Important Message (Signed)
Important Message  Patient Details  Name: Justin Leonard MRN: [id number] Date of Birth: 1927-03-02   Medicare Important Message Given:  Yes     Dannette Barbara 06/01/2021, 10:58 AM

## 2021-06-01 NOTE — Progress Notes (Signed)
Hematology/Oncology Progress Note [facility] Telephone:([phone] [phone]  Patient Care Team: [name], MD as PCP - General [name], MD as Consulting Physician (Cardiology) Alisa Graff, FNP as Nurse Practitioner (Family Medicine)   Name of the patient: Justin Leonard  [id number]  [birth year]  Date of visit: 06/01/21   INTERVAL HISTORY-  Patient is resting in bed, lethargic.  Son Leroy Sea is at bedside.    Review of systems- Review of Systems  Unable to perform ROS: Mental status change   Allergies  Allergen Reactions   No Known Allergies     Patient Active Problem List   Diagnosis Date Noted   Brain metastases [facility])    New onset seizure ([place])    Goals of care, counseling/discussion    Pressure injury of skin 05/28/2021   ICH (intracerebral hemorrhage) ([place]) 05/27/2021   Seizure ([place]) 05/27/2021   Lung nodule 05/27/2021   CKD (chronic kidney disease), stage IIIa 05/27/2021   Iron deficiency anemia 05/27/2021   Atrial fibrillation, chronic ([place]) 87/86/7672   Acute metabolic encephalopathy 09/47/0962   Carotid stenosis, bilateral 07/12/2019   Symptomatic carotid artery stenosis 07/03/2019   GERD (gastroesophageal reflux disease) 01/08/2019   Atrial fibrillation with RVR ([place]) 01/08/2019   Chest pain 12/05/2017   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   Epigastric hernia    PAH (pulmonary artery hypertension) ([place]) 10/13/2016   Abdominal pain, bilateral upper quadrant 05/09/2016   Diabetic hypoglycemia ([place]) 10/13/2015   Elevated troponin 07/15/2015   Chronic diastolic heart failure ([place]) 04/17/2015   HTN (hypertension) 04/17/2015   Diabetes ([place]) 04/17/2015   COPD (chronic obstructive pulmonary disease) ([place]) 04/17/2015   Clinical depression 08/23/2012   Melanoma in situ ([place]) 08/23/2012   Exomphalos 08/23/2012   Major depressive disorder with single episode 08/23/2012   Difficulty hearing 01/18/2012   HLD  (hyperlipidemia) 01/18/2012   Peripheral nerve disease 01/18/2012   Adiposity 01/18/2012   CAD (coronary artery disease), native coronary artery 01/18/2012     Past Medical History:  Diagnosis Date   Afib ([place])    Anemia    Arthritis    Back pain    CHF (congestive heart failure) ([name])    COPD (chronic obstructive pulmonary disease) ([place])    Coronary artery disease    Deafness    Depression    Diabetes mellitus without complication ([place])    Dizziness    when gets up   GERD (gastroesophageal reflux disease)    Hyperlipidemia    Hypertension    Melena    Myocardial infarction ([place])    maybe a small one   Obesity    Oxygen decrease    WEARS HS,PRN   Peripheral neuropathy    Peripheral neuropathy    bil.hands and feet   Skin cancer    Umbilical hernia      Past Surgical History:  Procedure Laterality Date   CARDIAC CATHETERIZATION Left 07/17/2015   Procedure: Right/Left Heart Cath and Coronary Angiography;  Surgeon: [name], MD;  Location: [place];  Service: Cardiovascular;  Laterality: Left;   CARDIAC CATHETERIZATION N/A 07/17/2015   Procedure: Coronary Stent Intervention;  Surgeon: [name], MD;  Location: [place] [facility];  Service: Cardiovascular;  Laterality: N/A;   CARDIAC CATHETERIZATION Right 05/10/2016   Procedure: Left Heart Cath and Coronary Angiography;  Surgeon: [name], MD;  Location: [place] [facility];  Service: Cardiovascular;  Laterality: Right;   CAROTID PTA/STENT INTERVENTION Right 07/12/2019  Procedure: CAROTID PTA/STENT INTERVENTION;  Surgeon: Algernon Huxley, MD;  Location: [place] [facility];  Service: Cardiovascular;  Laterality: Right;   CHOLECYSTECTOMY N/A 11/04/2016   Procedure: LAPAROSCOPIC CHOLECYSTECTOMY, POSSIBLE OPEN umbilical hernia repair;  Surgeon: [name], MD;  Location: [facility] ORS;  Service: General;  Laterality: N/A;   COLONOSCOPY WITH PROPOFOL N/A 12/12/2015   Procedure: COLONOSCOPY WITH  PROPOFOL;  Surgeon: [name], MD;  Location: [facility] ENDOSCOPY;  Service: Gastroenterology;  Laterality: N/A;   CORONARY ANGIOPLASTY     stent 8/16   CORONARY ARTERY BYPASS GRAFT  1994   ESOPHAGOGASTRODUODENOSCOPY N/A 11/10/2015   Procedure: ESOPHAGOGASTRODUODENOSCOPY (EGD);  Surgeon: [name], MD;  Location: [facility] ENDOSCOPY;  Service: Endoscopy;  Laterality: N/A;   EYE SURGERY     gall bladder drain     MOHS SURGERY      Social History   Socioeconomic History   Marital status: Widowed    Spouse name: Not on file   Number of children: Not on file   Years of education: Not on file   Highest education level: Not on file  Occupational History   Not on file  Tobacco Use   Smoking status: Former    Packs/day: 3.00    Years: 30.00    Pack years: 90.00    Types: Cigarettes    Quit date: 04/16/1978    Years since quitting: 43.1   Smokeless tobacco: Never   Tobacco comments:    Quit smoking 1979  Vaping Use   Vaping Use: Never used  Substance and Sexual Activity   Alcohol use: No    Alcohol/week: 0.0 standard drinks   Drug use: No   Sexual activity: Not Currently  Other Topics Concern   Not on file  Social History Narrative   Not on file   Social Determinants of Health   Financial Resource Strain: Not on file  Food Insecurity: Not on file  Transportation Needs: Not on file  Physical Activity: Not on file  Stress: Not on file  Social Connections: Not on file  Intimate Partner Violence: Not on file     Family History  Problem Relation Age of Onset   Hypertension Father    CVA Father    Anemia Neg Hx    Arrhythmia Neg Hx    Asthma Neg Hx    Clotting disorder Neg Hx    Fainting Neg Hx    Heart attack Neg Hx    Heart disease Neg Hx    Heart failure Neg Hx    Hyperlipidemia Neg Hx    Prostate cancer Neg Hx    Bladder Cancer Neg Hx    Kidney cancer Neg Hx      Current Facility-Administered Medications:    acetaminophen (TYLENOL) tablet 650 mg, 650 mg, Oral, Q6H  PRN, 650 mg at 05/31/21 1801 **OR** acetaminophen (TYLENOL) suppository 650 mg, 650 mg, Rectal, Q6H PRN, Ivor Costa, MD   albuterol (PROVENTIL) (2.5 MG/3ML) 0.083% nebulizer solution 2.5 mg, 2.5 mg, Nebulization, Q4H PRN, Ivor Costa, MD   Chlorhexidine Gluconate Cloth 2 % PADS 6 each, 6 each, Topical, Daily, Ivor Costa, MD, 6 each at 05/31/21 2244   dexamethasone (DECADRON) tablet 2 mg, 2 mg, Oral, BID, Fritzi Mandes, MD, 2 mg at 06/01/21 0907   enoxaparin (LOVENOX) injection 40 mg, 40 mg, Subcutaneous, Q24H, Fritzi Mandes, MD, 40 mg at 05/31/21 1315   ferrous sulfate tablet 325 mg, 325 mg, Oral, BID PC, Ivor Costa, MD, 325 mg at  06/01/21 0908   FLUoxetine (PROZAC) capsule 40 mg, 40 mg, Oral, Daily, Ivor Costa, MD, 40 mg at 06/01/21 0908   hydrALAZINE (APRESOLINE) injection 10 mg, 10 mg, Intravenous, Q4H PRN, Fritzi Mandes, MD   insulin aspart (novoLOG) injection 0-5 Units, 0-5 Units, Subcutaneous, QHS, Fritzi Mandes, MD, 2 Units at 05/31/21 2244   insulin aspart (novoLOG) injection 0-9 Units, 0-9 Units, Subcutaneous, TID WC, Fritzi Mandes, MD, 3 Units at 06/01/21 0911   insulin glargine (LANTUS) injection 25 Units, 25 Units, Subcutaneous, BID, Fritzi Mandes, MD   levETIRAcetam (KEPPRA XR) 24 hr tablet 500 mg, 500 mg, Oral, BID, Fritzi Mandes, MD, 500 mg at 06/01/21 0623   LORazepam (ATIVAN) injection 1 mg, 1 mg, Intravenous, Q2H PRN, Ivor Costa, MD, 1 mg at 05/27/21 1622   losartan (COZAAR) tablet 25 mg, 25 mg, Oral, Daily, Ivor Costa, MD, 25 mg at 06/01/21 7628   metoprolol succinate (TOPROL-XL) 24 hr tablet 12.5 mg, 12.5 mg, Oral, QHS, Ivor Costa, MD, 12.5 mg at 05/31/21 2243   nitroGLYCERIN (NITROSTAT) SL tablet 0.4 mg, 0.4 mg, Sublingual, Q5 min PRN, Ivor Costa, MD   ondansetron [facility]) injection 4 mg, 4 mg, Intravenous, Q8H PRN, Ivor Costa, MD   pantoprazole (PROTONIX) EC tablet 40 mg, 40 mg, Oral, Daily, Ivor Costa, MD, 40 mg at 06/01/21 0908   polyethylene glycol (MIRALAX / GLYCOLAX) packet 17 g, 17 g,  Oral, Daily PRN, Ivor Costa, MD   polyethylene glycol (MIRALAX / GLYCOLAX) packet 17 g, 17 g, Oral, Daily, Fritzi Mandes, MD, 17 g at 06/01/21 0912   pravastatin (PRAVACHOL) tablet 20 mg, 20 mg, Oral, q1800, Ivor Costa, MD, 20 mg at 05/31/21 1801   sodium chloride flush (NS) 0.9 % injection 3 mL, 3 mL, Intravenous, PRN, Fritzi Mandes, MD, 3 mL at 05/30/21 0601   tamsulosin (FLOMAX) capsule 0.4 mg, 0.4 mg, Oral, Daily, Ivor Costa, MD, 0.4 mg at 06/01/21 0908   tiotropium [facility]) inhalation capsule ([facility] use ONLY) 18 mcg, 18 mcg, Inhalation, Daily, Ivor Costa, MD, 18 mcg at 06/01/21 0913   Physical exam:  Vitals:   05/31/21 2023 05/31/21 2329 06/01/21 0445 06/01/21 0727  BP: (!) 140/98 (!) 144/79 (!) 137/92 (!) 154/81  Pulse: 74 85 78 71  Resp: 18 16 18 18   Temp: 97.9 F (36.6 C) 97.6 F (36.4 C) 98 F (36.7 C) 98.2 F (36.8 C)  TempSrc:      SpO2: 94% 93% 94% 92%  Weight:      Height:       Physical Exam Constitutional:      General: He is not in acute distress. Pulmonary:     Effort: Pulmonary effort is normal.  Neurological:     Comments: Lethargic.       CMP Latest Ref Rng & Units 06/01/2021  Glucose 70 - 99 mg/dL 250(H)  BUN 8 - 23 mg/dL 26(H)  Creatinine 0.61 - 1.24 mg/dL 1.10  Sodium 135 - 145 mmol/L 132(L)  Potassium 3.5 - 5.1 mmol/L 4.1  Chloride 98 - 111 mmol/L 100  CO2 22 - 32 mmol/L 21(L)  Calcium 8.9 - 10.3 mg/dL 10.1  Total Protein 6.5 - 8.1 g/dL -  Total Bilirubin 0.3 - 1.2 mg/dL -  Alkaline Phos 38 - 126 U/L -  AST 15 - 41 U/L -  ALT 0 - 44 U/L -   CBC Latest Ref Rng & Units 05/28/2021  WBC 4.0 - 10.5 K/uL 6.8  Hemoglobin 13.0 - 17.0 g/dL 10.7(L)  Hematocrit 39.0 -  52.0 % 31.1(L)  Platelets 150 - 400 K/uL 170    RADIOGRAPHIC STUDIES: I have personally reviewed the radiological images as listed and agreed with the findings in the report. CT HEAD WO CONTRAST  Result Date: 05/27/2021 CLINICAL DATA:  Altered mental status possible seizure EXAM: CT  HEAD WITHOUT CONTRAST TECHNIQUE: Contiguous axial images were obtained from the base of the skull through the vertex without intravenous contrast. COMPARISON:  CT 05/27/2021 FINDINGS: Brain: Advanced atrophy. White matter hypodensity consistent with chronic small vessel ischemic change. Redemonstrated left frontal lobe hemorrhage with hematocrit level, this measures 2.9 by 2.8 by 3.4 cm, previously 2.5 x 2.4 x 3 cm. Increased edema within the surrounding white matter. Hemorrhage within the right parasagittal frontal lobe measuring 1 by 1 by 1 cm, previously 1 cm. Hemorrhage within the lateral right frontal lobe measuring 13 x 11 x 11 mm, previously 10 x 14 x 10 mm, grossly stable. Similar degree of surrounding edema. Trace subarachnoid hemorrhage within the right sylvian fissure as before. Chronic lacunar infarct within the left caudate. No midline shift. Stable ventricle size. Vascular: No hyperdense vessels.  Carotid vascular calcification Skull: Normal. Negative for fracture or focal lesion. Sinuses/Orbits: No acute finding. Other: None IMPRESSION: 1. Multifocal acute intraparenchymal hemorrhages within the bilateral frontal lobes with surrounding edema are again demonstrated. Largest lesion in the left frontal lobe is slightly increased in size, measuring up to 3.4 cm maximum, previously 3 cm maximum. Slight increased surrounding edema. Hemorrhage is in the right frontal lobe are grossly unchanged. Contrast-enhanced MRI previously recommended to evaluate for underlying lesions. Trace subarachnoid hemorrhage within the right sylvian fissure is also unchanged 2. Atrophy and chronic small vessel ischemic changes of the white matter Electronically Signed   By: Donavan Foil M.D.   On: 05/27/2021 19:18   CT Head Wo Contrast  Result Date: 05/27/2021 CLINICAL DATA:  Delirium. EXAM: CT HEAD WITHOUT CONTRAST CT CERVICAL SPINE WITHOUT CONTRAST TECHNIQUE: Multidetector CT imaging of the head and cervical spine was  performed following the standard protocol without intravenous contrast. Multiplanar CT image reconstructions of the cervical spine were also generated. COMPARISON:  No pertinent prior exams available for comparison. FINDINGS: CT HEAD FINDINGS Brain: Moderate generalized cerebral atrophy. Comparatively mild cerebellar atrophy. 2.5 x 2.4 x 3.0 cm acute parenchymal hemorrhage within the medial mid left frontal lobe with mild to moderate surrounding edema. 1 cm acute parenchymal hemorrhage within the medial mid right frontal lobe with mild surrounding edema (series 2, image 22). 1.0 x 1.4 x 1.0 within the lateral right frontal lobe with mild surrounding edema (series 2, image 17). Trace acute subarachnoid hemorrhage along the posterior aspect of the right sylvian fissure (series 2, image 14). Chronic lacunar infarct within the left caudate nucleus. Background mild ill-defined hypoattenuation within the cerebral white matter, nonspecific but compatible with chronic small vessel ischemic disease. No demarcated cortical infarct. No hydrocephalus or midline shift. Vascular: No hyperdense vessel.  Atherosclerotic calcifications. Skull: Normal. Negative for fracture or focal lesion. Sinuses/Orbits: Visualized orbits show no acute finding. No significant paranasal sinus disease. CT CERVICAL SPINE FINDINGS Alignment: Cervical dextrocurvature. Straightening of the expected cervical lordosis. 2 mm C3-C4 grade 1 anterolisthesis. Fused C5-C6 grade 1 retrolisthesis. Skull base and vertebrae: The basion-dental and atlanto-dental intervals are maintained.No evidence of acute fracture to the cervical spine. Fusion across the disc space at C2-C3. Bilateral C2-C3 facet joint ankylosis also present. Soft tissues and spinal canal: No prevertebral fluid or swelling. No visible canal hematoma. Right carotid artery  stent. Disc levels: Cervical spondylosis with multilevel disc space narrowing, disc bulges, posterior disc osteophytes, endplate  spurring, uncovertebral hypertrophy and facet arthrosis. Multilevel spinal canal stenosis. Most notably, there is apparent moderate spinal canal stenosis at C5-C6. Multilevel ventral osteophytes. Most notably, a bridging ventral osteophyte is present at C5-C6. Upper chest: The upper chest is excluded from the field of view. These results were called by telephone at the time of interpretation on 05/27/2021 at 12:52 pm to provider Duffy Bruce , who verbally acknowledged these results. IMPRESSION: CT head: 1. Acute parenchymal hemorrhages (3 total) within the bilateral frontal lobes with surrounding edema. The largest hemorrhage is present within the medial mid left frontal lobe, measuring 2.5 x 3.0 cm. Underlying lesions (i.e. metastases) at these sites cannot be excluded, and a contrast-enhanced brain MRI is recommended for further evaluation. 2. Trace subarachnoid hemorrhage within the posterior right sylvian fissure. 3. Chronic left basal ganglia lacunar infarct. 4. Background generalized parenchymal atrophy and cerebral white matter chronic small vessel ischemic disease. CT cervical spine: 1. No evidence of acute fracture to the cervical spine. 2. 2 mm C3-C4 grade 1 anterolisthesis. 3. Trace fused C5-C6 grade 1 retrolisthesis. 4. Cervical spondylosis, as described. 5. Fusion across the C2-C3 disc space and posterior elements. Electronically Signed   By: Kellie Simmering DO   On: 05/27/2021 12:59   CT CERVICAL SPINE WO CONTRAST  Result Date: 05/27/2021 CLINICAL DATA:  Delirium. EXAM: CT HEAD WITHOUT CONTRAST CT CERVICAL SPINE WITHOUT CONTRAST TECHNIQUE: Multidetector CT imaging of the head and cervical spine was performed following the standard protocol without intravenous contrast. Multiplanar CT image reconstructions of the cervical spine were also generated. COMPARISON:  No pertinent prior exams available for comparison. FINDINGS: CT HEAD FINDINGS Brain: Moderate generalized cerebral atrophy. Comparatively  mild cerebellar atrophy. 2.5 x 2.4 x 3.0 cm acute parenchymal hemorrhage within the medial mid left frontal lobe with mild to moderate surrounding edema. 1 cm acute parenchymal hemorrhage within the medial mid right frontal lobe with mild surrounding edema (series 2, image 22). 1.0 x 1.4 x 1.0 within the lateral right frontal lobe with mild surrounding edema (series 2, image 17). Trace acute subarachnoid hemorrhage along the posterior aspect of the right sylvian fissure (series 2, image 14). Chronic lacunar infarct within the left caudate nucleus. Background mild ill-defined hypoattenuation within the cerebral white matter, nonspecific but compatible with chronic small vessel ischemic disease. No demarcated cortical infarct. No hydrocephalus or midline shift. Vascular: No hyperdense vessel.  Atherosclerotic calcifications. Skull: Normal. Negative for fracture or focal lesion. Sinuses/Orbits: Visualized orbits show no acute finding. No significant paranasal sinus disease. CT CERVICAL SPINE FINDINGS Alignment: Cervical dextrocurvature. Straightening of the expected cervical lordosis. 2 mm C3-C4 grade 1 anterolisthesis. Fused C5-C6 grade 1 retrolisthesis. Skull base and vertebrae: The basion-dental and atlanto-dental intervals are maintained.No evidence of acute fracture to the cervical spine. Fusion across the disc space at C2-C3. Bilateral C2-C3 facet joint ankylosis also present. Soft tissues and spinal canal: No prevertebral fluid or swelling. No visible canal hematoma. Right carotid artery stent. Disc levels: Cervical spondylosis with multilevel disc space narrowing, disc bulges, posterior disc osteophytes, endplate spurring, uncovertebral hypertrophy and facet arthrosis. Multilevel spinal canal stenosis. Most notably, there is apparent moderate spinal canal stenosis at C5-C6. Multilevel ventral osteophytes. Most notably, a bridging ventral osteophyte is present at C5-C6. Upper chest: The upper chest is excluded  from the field of view. These results were called by telephone at the time of interpretation on 05/27/2021 at 12:52  pm to provider Duffy Bruce , who verbally acknowledged these results. IMPRESSION: CT head: 1. Acute parenchymal hemorrhages (3 total) within the bilateral frontal lobes with surrounding edema. The largest hemorrhage is present within the medial mid left frontal lobe, measuring 2.5 x 3.0 cm. Underlying lesions (i.e. metastases) at these sites cannot be excluded, and a contrast-enhanced brain MRI is recommended for further evaluation. 2. Trace subarachnoid hemorrhage within the posterior right sylvian fissure. 3. Chronic left basal ganglia lacunar infarct. 4. Background generalized parenchymal atrophy and cerebral white matter chronic small vessel ischemic disease. CT cervical spine: 1. No evidence of acute fracture to the cervical spine. 2. 2 mm C3-C4 grade 1 anterolisthesis. 3. Trace fused C5-C6 grade 1 retrolisthesis. 4. Cervical spondylosis, as described. 5. Fusion across the C2-C3 disc space and posterior elements. Electronically Signed   By: Kellie Simmering DO   On: 05/27/2021 12:59   MR BRAIN W WO CONTRAST  Result Date: 05/27/2021 CLINICAL DATA:  Seizure-like activity.  Multifocal hemorrhage on CT. EXAM: MRI HEAD WITHOUT AND WITH CONTRAST TECHNIQUE: Multiplanar, multiecho pulse sequences of the brain and surrounding structures were obtained without and with intravenous contrast. CONTRAST:  4mL GADAVIST GADOBUTROL 1 MMOL/ML IV SOLN COMPARISON:  Head CT 05/27/2021 FINDINGS: Brain: There are at least 12 enhancing supratentorial brain lesions (annotated on series 15), with a punctate thirteenth lesion questioned in the left frontal lobe (series 15, image 113). As seen on today's earlier CT, some lesions are hemorrhagic including the dominant lesion which measures 3.7 cm in the left frontal lobe with moderate surrounding edema. There is mild edema associated with to hemorrhagic lesions in the right  frontal lobe, and there is also mild edema associated with lesions in the mesial right temporal lobe and left occipital lobe. No acute infarct, midline shift, or extra-axial fluid collection is identified. There is moderate cerebral atrophy. T2 hyperintensities elsewhere in the cerebral white matter bilaterally are nonspecific but compatible with mild chronic small vessel ischemic disease. There are chronic lacunar infarcts in the basal ganglia and right cerebellum. Trace subarachnoid hemorrhage is noted in the right sylvian fissure as seen on the earlier CT. Vascular: Major intracranial vascular flow voids are preserved. Skull and upper cervical spine: No suspicious marrow lesion. Sinuses/Orbits: Bilateral cataract extraction. Paranasal sinuses and mastoid air cells are clear. Other: None. IMPRESSION: 1. At least 12 enhancing supratentorial brain lesions consistent with metastases, some of which are hemorrhagic. Moderate edema associated with the dominant left frontal lesion. 2. Trace subarachnoid hemorrhage in the right Sylvian fissure. Electronically Signed   By: Logan Bores M.D.   On: 05/27/2021 19:11   CT CHEST ABDOMEN PELVIS W CONTRAST  Result Date: 05/27/2021 CLINICAL DATA:  Altered mental status.  Cough, weakness. EXAM: CT CHEST, ABDOMEN, AND PELVIS WITH CONTRAST TECHNIQUE: Multidetector CT imaging of the chest, abdomen and pelvis was performed following the standard protocol during bolus administration of intravenous contrast. CONTRAST:  127mL OMNIPAQUE IOHEXOL 300 MG/ML  SOLN COMPARISON:  Chest x-ray earlier today.  Chest CT 11/06/2015 FINDINGS: CT CHEST FINDINGS Cardiovascular: Heart is mildly enlarged. Coronary artery and aortic calcifications. Prior CABG. No aneurysm or dissection. Mediastinum/Nodes: No mediastinal, hilar, or axillary adenopathy. Trachea and esophagus are unremarkable. Thyroid unremarkable. Lungs/Pleura: Numerous bilateral pulmonary nodules and masses. Index right lower lobe  pulmonary nodule on image 129 measures 3.7 cm. Index left lower lobe pulmonary nodule measures 2.2 cm on image 91. Findings concerning for metastases. No effusions. Musculoskeletal: Bilateral gynecomastia.  No acute bony abnormality. CT ABDOMEN PELVIS  FINDINGS Hepatobiliary: No focal liver abnormality is seen. Status post cholecystectomy. No biliary dilatation. Pancreas: No focal abnormality or ductal dilatation. Spleen: No focal abnormality.  Normal size. Adrenals/Urinary Tract: Mild fullness of the adrenal glands bilaterally, likely hyperplasia. No renal mass or hydronephrosis. Urinary bladder unremarkable. Stomach/Bowel: Diffuse colonic diverticulosis. No active diverticulitis. Stomach and small bowel decompressed, grossly unremarkable. Vascular/Lymphatic: Aortic atherosclerosis. No evidence of aneurysm or adenopathy. Reproductive: Prostate enlargement Other: No free fluid or free air. Musculoskeletal: No acute bony abnormality. IMPRESSION: Numerous bilateral pulmonary nodules and masses involving all lobes of both lungs with index lesions measured as above. Appearance is concerning for metastatic disease. Cardiomegaly, coronary artery disease. Aortic atherosclerosis. Colonic diverticulosis. Prostate enlargement. Electronically Signed   By: Rolm Baptise M.D.   On: 05/27/2021 19:13   DG Chest Portable 1 View  Result Date: 05/27/2021 CLINICAL DATA:  Cough.  Weakness. EXAM: PORTABLE CHEST 1 VIEW COMPARISON:  12/05/2017. FINDINGS: Prior CABG. Cardiomegaly. Bilateral interstitial prominence. Interstitial edema and or pneumonitis cannot be excluded. Bilateral pulmonary nodular densities are noted. Chest CT can be obtained for further evaluation. No pleural effusion or pneumothorax. Biapical pleural thickening consistent scarring. No acute bony abnormality. IMPRESSION: 1. Prior CABG. Cardiomegaly. Bilateral interstitial prominence consistent with interstitial edema. Pneumonitis cannot be excluded. 2. Multiple  bilateral pulmonary nodules for which chest CT can be obtained for further evaluation. These pulmonary nodules could be infectious or secondary to metastatic disease. Electronically Signed   By: Marcello Moores  Register   On: 05/27/2021 12:37    Assessment and plan-   Clinically metastatic brain lesions as well as multiple metastatic lung lesions. No obvious primary site on CT/MRI. History of melanoma.  Possible metastatic melanoma or other metastatic disease. Intracranial bleeding/metastasis, prognosis is poor. Patient is not candidate for aggressive chemotherapy and tissue diagnosis biopsy is of high risk. Recommend hospice. Discussed with patient's son Leroy Sea at bedside today. Family has decided to proceed with hospice.  Palliative care on board.   Thank you for allowing me to participate in the care of this patient.   Earlie Server, MD, PhD Hematology Oncology [facility] at [facility] Pager- [phone] 06/01/2021

## 2021-06-01 NOTE — Progress Notes (Addendum)
Patient ID: Justin Leonard, male   DOB: [birth year], 87 y.o.   MRN: [id number] family has decided for hospice facility. TOC informed  Dr Tasia Catchings aware.  Family wants to keep him comfortable. They are aware po meds will be d/ced and only meds for comfort will be given.  Will transfer to hospice facility once bed opens up

## 2021-06-01 NOTE — Progress Notes (Addendum)
SLP Cancellation Note  Patient Details Name: Justin Leonard MRN: [id number] DOB: [birth year]   Cancelled treatment:       Reason Eval/Treat Not Completed: Medical issues which prohibited therapy;Patient not medically ready (chart reviewed; consulted NSG, met w/ pt and family in room). Pt had recently finished eating his Lunch meal; Son present to assist him. Son reported no overt coughing or choking w/ foods/liquids during the meal, no discomfort breathing during the meal. Pt was able to eat several bites of foods and drink his liquids.  Pt was awake at time of this SLP meeting him but nonverbal. Son reported the change in pt's verbal communication occurred earlier last week and has not fully returned. Pt has been able to state a few spontaneous phrases intermittently per Son but nothing consistent. Pt is also HOH w/ 1 HA in right ear.  Noted MRI results indicating "metastatic brain lesions as well as multiple metastatic lung lesions" w/ "intracranial bleeding/metastasis, prognosis is poor". MD gave recommendation of Hospice; the family has decided to proceed w/ Hospice per MD.  ST services will hold on any further cognitive-linguistic assessment at this time. Discussed general aspiration precautions and easy to eat food options w/ pt's Son at bedside. ST services can be available for re-consult if any new needs arise while admitted. Son agreed.       Orinda Kenner, MS, CCC-SLP Speech Language Pathologist Rehab Services [phone] [facility] 06/01/2021, 1:18 PM

## 2021-06-01 NOTE — Progress Notes (Signed)
[facility] [place] [facility] [place] NAME: Justin Leonard    MR#:  [id number]  DATE OF BIRTH:  Jun 15, 1927  SUBJECTIVE:   son at bedside. Patient more awake. Aphasia  Eating well. Worsening right upper and lower extremity weakness Sugars up Son at bedside--answered most of the quesitons REVIEW OF SYSTEMS:   Review of Systems  Unable to perform ROS: Mental status change  Tolerating Diet:yes Tolerating PT: SNF  DRUG ALLERGIES:   Allergies  Allergen Reactions  . No Known Allergies     VITALS:  Blood pressure (!) 154/81, pulse 71, temperature 98.2 F (36.8 C), resp. rate 18, height 6\' 2"  (1.88 m), weight 97.2 kg, SpO2 92 %.  PHYSICAL EXAMINATION:   Physical Exam  GENERAL:  87 y.o.-year-old patient lying in the bed with no acute distress.  LUNGS: Normal breath sounds bilaterally, no wheezing, rales, rhonchi. No use of accessory muscles of respiration.  CARDIOVASCULAR: S1, S2 normal. No murmurs, rubs, or gallops.  ABDOMEN: Soft, nontender, nondistended. Bowel sounds present. No organomegaly or mass.  EXTREMITIES: No cyanosis, clubbing or edema b/l.    NEUROLOGIC: right side hemiparesis 2/5. Unable to test in detail due to patient participation, expressive aphasia PSYCHIATRIC:  patient is alert and awake  SKIN:  Pressure Injury 05/27/21 Sacrum Mid Stage 2 -  Partial thickness loss of dermis presenting as a shallow open injury with a red, pink wound bed without slough. (Active)  05/27/21 1800  Location: Sacrum  Location Orientation: Mid  Staging: Stage 2 -  Partial thickness loss of dermis presenting as a shallow open injury with a red, pink wound bed without slough.  Wound Description (Comments):   Present on Admission: Yes     LABORATORY PANEL:  CBC Recent Labs  Lab 05/28/21 0548  WBC 6.8  HGB 10.7*  HCT 31.1*  PLT 170     Chemistries  Recent Labs  Lab 06/01/21 0816  NA 132*  K 4.1  CL 100  CO2 21*  GLUCOSE 250*  BUN 26*   CREATININE 1.10  CALCIUM 10.1    Cardiac Enzymes No results for input(s): TROPONINI in the last 168 hours. RADIOLOGY:  No results found. ASSESSMENT AND PLAN:  Justin Leonard is a 87 y.o. 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  ICH (intracerebral hemorrhage) and acute metabolic encephalopathy: right hemiplegia/Aphasia --CT-head showed acute parenchymal hemorrhages and trace subarachnoid hemorrhage. Metastatic disease cannot be rulled out. Pt has hx of melanoma in the 1970's per sons -- Justin Leonard of neurosurgeon recommendations noted "May need biopsy of lung lesions if family desires it.  I would recommend against intracranial biopsy unless the other lesions are too risky - ok to mobilize- hold anticoagulation" - DVT prophylaxis - ok to start  - continue AEDs---- Keppra 500 mg bid - cont dexamethasone for brain edema - 2 mg BID --not much improvement in neuro status   Possible seizure (Justin Leonard) -Seizure precaution -When necessary Ativan for seizure -on keppra as above   Chronic diastolic heart failure and Hx of pulm HT N:  --2D echo on 01/09/2019 showed EF of 60-65% with grade 1 diastolic dysfunction.  Patient does not have leg edema no  respiratory distress.  CHF seems compensated. -Hold torsemide and spironolactone  -BNP --> 149   HTN (hypertension): -IV hydralazine as needed for Bp >160 due to [name] -Cozaar and metoprolol   COPD (chronic obstructive pulmonary disease) ([place]): Stable -Bronchodilators   Elevated troponin and hx of CAD (coronary artery disease), native coronary artery: No CP.   Trop 20 -->17.  Already normalized.  Likely nonspecific. -Pravastatin   HLD (hyperlipidemia) -Pravastatin   metastatic lesions in the lung and brain -CT-chest/abd/pelvis--Numerous bilateral pulmonary nodules and masses involving all lobes of both lungs with index lesions measured as above. Appearance is concerning for metastatic disease. -- Case discussed with Dr. Tasia Catchings from oncology. Patient is a poor candidate for further evaluation. This was discussed by Dr. Tasia Catchings with family. Hospice is recommended   CKD (chronic kidney disease), stage IIIa with Dm-2 and HTN Renal function close to baseline.  Recent baseline creatinine 1.0-1.3.  His creatinine is 1.20, BUN 23 --BMP (7/4)--stable   Iron deficiency anemia: Hemoglobin stable, 10.1 -Continue iron supplement   Atrial fibrillation, chronic ([name]) -Continue metoprolol -Hold Eliquis  Dm-2,uncontrolled, hyperglycemia, steroids --SSI and Lantus   appreciate palliative care input. TOC for d/c planning to rehab. Pt was very independent and active prior to admission.  DVT ppx: SCD Code Status: DNR/DNI Family Communication:   Yes, patient's son  at bed side Disposition Plan: TBD Consults called: Justin Leonard, oncology Admission status and Level of care: MedSurg:  as inpt         Status is: Inpatient   Dispo: The patient is from: Home              Anticipated d/c is to: TBD              Patient currently is medically optimized stable to d/c.              Difficult to place patient No  TOC for discharge planning--awaiting SNF bed offers Hospice option was also d/w son  TOTAL TIME TAKING CARE OF THIS PATIENT: 25 minutes.  >50% time spent on counselling and coordination of care  Note: This dictation was prepared with Dragon dictation along with smaller phrase technology. Any transcriptional errors that result from this process are unintentional.  Justin Leonard M.D    [facility]   CC: Primary care physician; Justin Castle, MD Patient ID: Justin Leonard, male   DOB: 1927/01/01, 87 y.o.   MRN: [id number]

## 2021-06-01 NOTE — Progress Notes (Signed)
[facility] Liaison RN note  Received referral from Doran Clay, Transitions of Care [facility]) manager and Dr. Posey Pronto, MD for family interest in [place]. Chart reviewed and spoke with family to acknowledge referral.   Unfortunately, Hospice Home is not able to offer a room today. Family and TOC aware hospital liaison will follow up tomorrow.  Please do not hesitate to call with any hospice related questions or concerns.  Thank you. Margaretmary Eddy, BSN, RN [facility] Liaison [phone]

## 2021-06-01 NOTE — TOC Progression Note (Signed)
Transition of Care [facility]) - Progression Note    Patient Details  Name: Justin Leonard MRN: [id number] Date of Birth: [birth year]  [facility]) CM/SW Contact  Shelbie Hutching, RN Phone Number: 06/01/2021, 1:26 PM  Clinical Narrative:    RNCM followed back up with patient's family.  Spoke with [name] via phone, [name] is the [place] and the son the patient was living with.  [name] wants the patient to be comfortable and is in agreement with hospice referral and residential hospice.   Referral for hospice facility given to [facility] with [facility].  Unsure if there are beds available today.     Expected Discharge Plan: [place] Barriers to Discharge: Continued Medical Work up  Expected Discharge Plan and Services Expected Discharge Plan: [place] In-house Referral: Clinical Social Work   Post Acute Care Choice: [place] Living arrangements for the past 2 months: Single Family Home                                       Social Determinants of Health (SDOH) Interventions    Readmission Risk Interventions No flowsheet data found.

## 2021-06-01 NOTE — Progress Notes (Signed)
Daily Progress Note   Patient Name: Justin Leonard       Date: 06/01/2021 DOB: [birth year]  Age: 87 y.o. MRN#: [id number] Attending Physician: Fritzi Mandes, MD Primary Care Physician: [name], MD Admit Date: 05/27/2021  Reason for Consultation/Follow-up: Establishing goals of care and Terminal Care  Subjective: Patient has been transitioned to comfort care by primary team. In to offer support to family. One of patient's sons is at bedside. He states his father lived a good life and he does not want him to suffer. He does not want him to leave this earth any earlier or later than he is meant to. Discussed his oral intake. He states he worked to get him to eat today. Discussed appetite changes as end of life approaches. He voices understanding. No distress noted; no changes in medications recommended.    Length of Stay: 5  Current Medications: Scheduled Meds:  . Chlorhexidine Gluconate Cloth  6 each Topical Daily  . polyethylene glycol  17 g Oral Daily    Continuous Infusions:   PRN Meds: acetaminophen **OR** acetaminophen, albuterol, LORazepam, morphine injection, morphine CONCENTRATE, ondansetron (ZOFRAN) IV, polyethylene glycol, sodium chloride flush  Physical Exam Constitutional:      Comments: Eyes closed. Even and unlabored respirations.             Vital Signs: BP (!) 172/86 (BP Location: Left Arm)   Pulse 74   Temp 98.7 F (37.1 C)   Resp 20   Ht 6\' 2"  (1.88 m)   Wt 97.2 kg   SpO2 93%   BMI 27.51 kg/m  SpO2: SpO2: 93 % O2 Device: O2 Device: Room Air O2 Flow Rate: O2 Flow Rate (L/min): 6 L/min  Intake/output summary:  Intake/Output Summary (Last 24 hours) at 06/01/2021 1433 Last data filed at 06/01/2021 1400 Gross per 24 hour  Intake 600 ml  Output 2650  ml  Net -2050 ml   LBM: Last BM Date: 05/31/21 Baseline Weight: Weight: 95 kg Most recent weight: Weight: 97.2 kg        Patient Active Problem List   Diagnosis Date Noted  . Brain metastases ([place])   . New onset seizure ([place])   . Goals of care, counseling/discussion   . Pressure injury of skin 05/28/2021  . ICH (intracerebral hemorrhage) ([place]) 05/27/2021  .  Seizure ([place]) 05/27/2021  . Lung nodule 05/27/2021  . CKD (chronic kidney disease), stage IIIa 05/27/2021  . Iron deficiency anemia 05/27/2021  . Atrial fibrillation, chronic ([place]) 05/27/2021  . Acute metabolic encephalopathy 25/95/6387  . Carotid stenosis, bilateral 07/12/2019  . Symptomatic carotid artery stenosis 07/03/2019  . GERD (gastroesophageal reflux disease) 01/08/2019  . Atrial fibrillation with RVR ([place]) 01/08/2019  . Chest pain 12/05/2017  . 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 Epigastric hernia   . PAH (pulmonary artery hypertension) ([place]) 10/13/2016  . Abdominal pain, bilateral upper quadrant 05/09/2016  . Diabetic hypoglycemia ([place]) 10/13/2015  . Elevated troponin 07/15/2015  . Chronic diastolic heart failure ([place]) 04/17/2015  . HTN (hypertension) 04/17/2015  . Diabetes ([place]) 04/17/2015  . COPD (chronic obstructive pulmonary disease) ([place]) 04/17/2015  . Clinical depression 08/23/2012  . Melanoma in situ ([place]) 08/23/2012  . Exomphalos 08/23/2012  . Major depressive disorder with single episode 08/23/2012  . Difficulty hearing 01/18/2012  . HLD (hyperlipidemia) 01/18/2012  . Peripheral nerve disease 01/18/2012  . Adiposity 01/18/2012  . CAD (coronary artery disease), native coronary artery 01/18/2012    Palliative Care Assessment & Plan   Recommendations/Plan: Waiting for hospice facility bed.  Patient appears comfortable. No changes recommended to symptom management at this time.   Code Status:    Code Status Orders  (From admission, onward)           Start     Ordered   05/28/21 1431   Do not attempt resuscitation (DNR)  Continuous       Question Answer Comment  In the event of cardiac or respiratory ARREST Do not call a "code blue"   In the event of cardiac or respiratory ARREST Do not perform Intubation, CPR, defibrillation or ACLS   In the event of cardiac or respiratory ARREST Use medication by any route, position, wound care, and other measures to relive pain and suffering. May use oxygen, suction and manual treatment of airway obstruction as needed for comfort.   Comments d/w 2 sons in ICU      05/28/21 1431           Code Status History     Date Active Date Inactive Code Status Order ID Comments User Context   05/27/2021 1708 05/28/2021 1431 Partial Code [id number]  Ivor Costa, MD ED   05/27/2021 1553 05/27/2021 1708 Full Code [id number]  Ivor Costa, MD ED   07/12/2019 1432 07/13/2019 1614 Full Code [id number]  Algernon Huxley, MD Inpatient   01/09/2019 0033 01/10/2019 1749 Full Code [id number]  Lance Coon, MD Inpatient   12/05/2017 0238 12/05/2017 2109 Full Code [id number]  Saundra Shelling, MD ED   11/04/2016 0919 11/05/2016 1511 Full Code [id number]  [name], MD Inpatient   09/21/2016 1605 09/22/2016 1806 Full Code [id number]  Hower, Aaron Mose, MD ED   05/09/2016 1617 05/12/2016 1629 Full Code [id number]  Idelle Crouch, MD Inpatient   11/05/2015 0638 11/14/2015 1939 Full Code [id number]  Harrie Foreman, MD Inpatient   07/17/2015 1614 07/18/2015 1639 Full Code [id number]  [name], MD Inpatient   07/17/2015 1541 07/17/2015 1614 Full Code [id number]  [name], MD Inpatient   07/15/2015 1704 07/17/2015 1541 Full Code [id number]  Demetrios Loll, MD Inpatient       Thank you for allowing the Palliative Medicine Team to assist in the care of this patient.       Total Time 15 min Prolonged Time Billed  no  Greater than 50%  of this time was spent counseling and coordinating care related to the above assessment and plan.  Asencion Gowda, NP  Please contact  Palliative Medicine Team phone at [phone] for questions and concerns.

## 2021-06-01 NOTE — TOC Progression Note (Signed)
Transition of Care [facility]) - Progression Note    Patient Details  Name: Justin Leonard MRN: [id number] Date of Birth: 1927-03-31  Transition [facility] Care [facility]) CM/SW Contact  Shelbie Hutching, RN Phone Number: 06/01/2021, 12:10 PM  Clinical Narrative:    RNCM stopped by the room to see patient and family.  Youngest Son at the bedside.  Son reports that they are leaning toward SNF for short term rehab.  There are 2 bed offers one for [facility] and one for [facility].  Son says they will probably choose [facility].   TOC will follow up with family again tomorrow.     Expected Discharge Plan: [place] Barriers to Discharge: Continued Medical Work up  Expected Discharge Plan and Services Expected Discharge Plan: [place] In-house Referral: Clinical Social Work   Post Acute Care Choice: [place] Living arrangements for the past 2 months: Single Family Home                                       Social Determinants of Health (SDOH) Interventions    Readmission Risk Interventions No flowsheet data found.

## 2021-06-02 MED ORDER — LORAZEPAM 0.5 MG PO TABS: 0.5000 mg | ORAL_TABLET | Freq: Four times a day (QID) | ORAL | 0 refills | Status: AC | PRN

## 2021-06-02 MED ORDER — MORPHINE SULFATE (CONCENTRATE) 10 MG/0.5ML PO SOLN: 5.0000 mg | ORAL | 0 refills | Status: AC | PRN

## 2021-06-02 NOTE — Progress Notes (Signed)
Patient to be discharged to hospice home.  Report given to [facility] at hospice.  All questions answered and AVS packet ready to send with patient.

## 2021-06-02 NOTE — Progress Notes (Signed)
[facility] Liaison RN note  Hospice Home eligibility approved. Room is available and offered today. Accepted by family. Son, Justin Leonard, going to sign consents now. Once completed we can arrange for transport.  Please call should you have any hospice related questions.  Thank you. Margaretmary Eddy, BSN, RN [facility] Liaison [phone]

## 2021-06-02 NOTE — TOC Transition Note (Signed)
Transition of Care [facility]) - CM/SW Discharge Note   Patient Details  Name: Justin Leonard MRN: [id number] Date of Birth: [birth year]  Transition of Care [facility]) CM/SW Contact:  Shelbie Hutching, RN Phone Number: 06/02/2021, 10:54 AM   Clinical Narrative:    Patient has been accepted to the [facility] in [place].  Patient will discharge there today.  Family going to sign consents.  [facility] will pick up patient for transport around 12 noon.     Final next level of care: [place] Barriers to Discharge: Barriers Resolved   Patient Goals and CMS Choice Patient states their goals for this hospitalization and ongoing recovery are:: Family has choosen hopsice medical facility CMS Medicare.gov Compare Post Acute Care list provided to:: Patient Represenative (must comment) Choice offered to / list presented to : Adult Children  Discharge Placement              Patient chooses bed at: Other - please specify in the comment section below: [facility]) Patient to be transferred to facility by: [place] Name of family member notified: Burnis Kingfisher, and Brad Patient and family notified of of transfer: 06/02/21  Discharge Plan and Services In-house Referral: Clinical Social Work   Post Acute Care Choice: [place]          DME Arranged: N/A DME Agency: NA       HH Arranged: NA          Social Determinants of Health (SDOH) Interventions     Readmission Risk Interventions No flowsheet data found.

## 2021-06-02 NOTE — Progress Notes (Signed)
[facility] at [place] NAME: Justin Leonard    MR#:  [id number]  DATE OF BIRTH:  09/23/1927  SUBJECTIVE:   2 sons at bedside. Patient not drinking or eating much since yday 4 pm per family  Aphasia and right hemiplegia   REVIEW OF SYSTEMS:   Review of Systems  Unable to perform ROS: Mental status change    DRUG ALLERGIES:   Allergies  Allergen Reactions  . No Known Allergies     VITALS:  Blood pressure 130/88, pulse 99, temperature 99.4 F (37.4 C), resp. rate 16, height 6\' 2"  (1.88 m), weight 97.2 kg, SpO2 90 %.  PHYSICAL EXAMINATION:   Physical Exam Comfort care GENERAL:  87 y.o.-year-old patient lying in the bed with no acute distress.  LUNGS: Normal breath sounds bilaterally CARDIOVASCULAR: S1, S2 normal. No murmurs, rubs, or gallops.   NEUROLOGIC: right side hemiplegia. Unable to test in detail due to patient participation, aphasia PSYCHIATRIC:  patient is sleepy SKIN:  Pressure Injury 05/27/21 Sacrum Mid Stage 2 -  Partial thickness loss of dermis presenting as a shallow open injury with a red, pink wound bed without slough. (Active)  05/27/21 1800  Location: Sacrum  Location Orientation: Mid  Staging: Stage 2 -  Partial thickness loss of dermis presenting as a shallow open injury with a red, pink wound bed without slough.  Wound Description (Comments):   Present on Admission: Yes     LABORATORY PANEL:  CBC Recent Labs  Lab 05/28/21 0548  WBC 6.8  HGB 10.7*  HCT 31.1*  PLT 170     Chemistries  Recent Labs  Lab 06/01/21 0816  NA 132*  K 4.1  CL 100  CO2 21*  GLUCOSE 250*  BUN 26*  CREATININE 1.10  CALCIUM 10.1    Cardiac Enzymes No results for input(s): TROPONINI in the last 168 hours. RADIOLOGY:  No results found. ASSESSMENT AND PLAN:  Justin Leonard is a 87 y.o. male with medical history significant of melanoma in face, hypertension, hyperlipidemia, diet-controlled diabetes, COPD on 2 L  oxygen, GERD, depression, peripheral neuropathy, CAD, stent placement, CABG, dCHF, pulmonary hypertension, anemia, atrial fibrillation on Eliquis, hard of hearing, carotid artery stenosis (s/p of right carotid artery stent placement per his son), who presents with altered mental status, possible seizure.  ICH (intracerebral hemorrhage) and acute metabolic encephalopathy: right hemiplegia/Aphasia --CT-head showed acute parenchymal hemorrhages and trace subarachnoid hemorrhage. Metastatic disease cannot be rulled out. Pt has hx of melanoma in the 1970's per sons   Possible seizure (Justin Leonard) -Seizure precaution -When necessary Ativan for seizure   Chronic diastolic heart failure and Hx of pulm HT N:  --2D echo on 01/09/2019 showed EF of 60-65% with grade 1 diastolic dysfunction.  Patient does not have leg edema no respiratory distress.  CHF seems compensated. --BNP --> 149   HTN (hypertension):   COPD (chronic obstructive pulmonary disease) ([name]):    Elevated troponin and hx of CAD (coronary artery disease), native coronary artery  HLD (hyperlipidemia)   metastatic lesions in the lung and brain -CT-chest/abd/pelvis--Numerous bilateral pulmonary nodules and masses involving all lobes of both lungs with index lesions measured as above. Appearance is concerning for metastatic disease. -- Case discussed with Dr. Tasia Catchings from oncology. Patient is a poor candidate for further evaluation. This was discussed by Dr. Tasia Catchings with family. Hospice is recommended   CKD (chronic kidney disease), stage IIIa with Dm-2 and HTN Renal function close to baseline.  Recent baseline creatinine 1.0-1.3.  His creatinine is 1.20, BUN 23   Iron deficiency anemia: Hemoglobin stable, 10.1   Atrial fibrillation, chronic ([name])  Dm-2,uncontrolled, hyperglycemia   appreciate palliative care input. Family has chosen for pt to be comfort care and request Hospice facility at d/c  DVT ppx: SCD Code Status: DNR/DNI Family  Communication:   Yes, patient's sons  at bed side Disposition Plan: Hospicefacility Consults called: Dr. Izora Ribas, oncology Admission status and Level of care: MedSurg:  as inpt         Status is: Inpatient   Dispo: The patient is from: Home              Anticipated d/c is [id number] bed availability                          TOTAL TIME TAKING CARE OF THIS PATIENT: 20 minutes.  >50% time spent on counselling and coordination of care  Note: This dictation was prepared with Dragon dictation along with smaller phrase technology. Any transcriptional errors that result from this process are unintentional.  Fritzi Mandes M.D    [facility]   CC: Primary care physician; Justin Castle, MD Patient ID: Justin Leonard, male   DOB: 12-Feb-1927, 87 y.o.   MRN: [id number]

## 2021-06-02 NOTE — Discharge Summary (Signed)
[facility] NAME: Justin Leonard    MR#:  [id number]  DATE OF BIRTH:  11-22-1927  DATE OF ADMISSION:  05/27/2021 ADMITTING PHYSICIAN: Ivor Costa, MD  DATE OF DISCHARGE: 06/02/2021  PRIMARY CARE PHYSICIAN: [name], MD    ADMISSION DIAGNOSIS:  Brain metastases ([place]) [C79.31] ICH (intracerebral hemorrhage) ([place]) [I61.9] New onset seizure ([place]) [R56.9] Intraparenchymal hematoma of brain, left, without loss of consciousness, initial encounter ([place]) [S06.350A]  DISCHARGE DIAGNOSIS:  Intracranial hemorrhage/Right hemiplegia/aphasia Brain and lung metastasis, unknown primary Seizure Disorder  SECONDARY DIAGNOSIS:   Past Medical History:  Diagnosis Date  . Afib ([place])   . Anemia   . Arthritis   . Back pain   . CHF (congestive heart failure) ([place])   . COPD (chronic obstructive pulmonary disease) ([place])   . Coronary artery disease   . Deafness   . Depression   . Diabetes mellitus without complication ([place])   . Dizziness    when gets up  . GERD (gastroesophageal reflux disease)   . Hyperlipidemia   . Hypertension   . Melena   . Myocardial infarction ([place])    maybe a small one  . Obesity   . Oxygen decrease    WEARS HS,PRN  . Peripheral neuropathy   . Peripheral neuropathy    bil.hands and feet  . Skin cancer   . Umbilical hernia     HOSPITAL COURSE:  Justin Leonard is a 87 y.o. 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   ICH (intracerebral hemorrhage) and acute metabolic encephalopathy: right hemiplegia/Aphasia --CT-head  showed acute parenchymal hemorrhages and trace subarachnoid hemorrhage. Metastatic disease cannot be rulled out. Pt has hx of melanoma in the 1970's per sons   Possible seizure ([place]) -Seizure precaution -When necessary Ativan for seizure   Chronic diastolic heart failure and Hx of pulm HT N:  --2D echo on 01/09/2019 showed EF of 60-65% with grade 1 diastolic dysfunction.  Patient does not have leg edema no respiratory distress.  CHF seems compensated. --BNP --> 149   HTN (hypertension):   COPD (chronic obstructive pulmonary disease) ([name]):   Elevated troponin and hx of CAD (coronary artery disease), native coronary artery   HLD (hyperlipidemia)   metastatic lesions in the lung and brain -CT-chest/abd/pelvis--Numerous bilateral pulmonary nodules and masses involving all lobes of both lungs with index lesions measured as above. Appearance is concerning for metastatic disease. -- Case discussed with Dr. Tasia Catchings from oncology. Patient is a poor candidate for further evaluation. This was discussed by Dr. Tasia Catchings with family. Hospice is recommended   CKD (chronic kidney disease), stage IIIa with Dm-2 and HTN   Iron deficiency anemia: Hemoglobin stable, 10.1   Atrial fibrillation, chronic ([name])   Dm-2,uncontrolled, hyperglycemia   appreciate palliative care input. Family has chosen for pt to be comfort care and request Hospice facility at d/c   DVT ppx: SCD Code Status: DNR/DNI Family Communication:   Yes, patient's sons  at bed side Disposition Plan: Hospicefacility Consults called: Dr. Izora Ribas, oncology Admission status and Level of care: MedSurg:  as inpt         Dispo: The patient is from: Home  Anticipated d/c is [id number] facility today  CONSULTS OBTAINED:  Treatment Team:  Meade Maw, MD Earlie Server, MD  DRUG ALLERGIES:   Allergies  Allergen Reactions  . No Known Allergies     DISCHARGE MEDICATIONS:   Allergies as of 06/02/2021       Reactions    No Known Allergies         Medication List     STOP taking these medications    clopidogrel 75 MG tablet Commonly known as: PLAVIX   Eliquis 2.5 MG Tabs tablet Generic drug: apixaban   ferrous sulfate 325 (65 FE) MG tablet   FLUoxetine 40 MG capsule Commonly known as: PROZAC   losartan 25 MG tablet Commonly known as: COZAAR   lovastatin 20 MG tablet Commonly known as: MEVACOR   metoprolol succinate 25 MG 24 hr tablet Commonly known as: TOPROL-XL   MIRALAX PO   nitroGLYCERIN 0.4 MG SL tablet Commonly known as: NITROSTAT   pantoprazole 40 MG tablet Commonly known as: PROTONIX   spironolactone 25 MG tablet Commonly known as: ALDACTONE   tamsulosin 0.4 MG Caps capsule Commonly known as: FLOMAX   Tiotropium Bromide Monohydrate 2.5 MCG/ACT Aers   torsemide 20 MG tablet Commonly known as: DEMADEX   Ventolin HFA 108 (90 Base) MCG/ACT inhaler Generic drug: albuterol       TAKE these medications    acetaminophen 650 MG CR tablet Commonly known as: TYLENOL Take 650 mg by mouth 2 (two) times daily as needed for pain.   LORazepam 0.5 MG tablet Commonly known as: Ativan Take 1 tablet (0.5 mg total) by mouth every 6 (six) hours as needed for anxiety.   morphine CONCENTRATE 10 MG/0.5ML Soln concentrated solution Take 0.25 mLs (5 mg total) by mouth every 4 (four) hours as needed for severe pain, shortness of breath or anxiety.   OXYGEN Place 2 L into the nose Nightly.               Discharge Care Instructions  (From admission, onward)           Start     Ordered   06/02/21 0000  Discharge wound care:       Comments: Apply foam padding   06/02/21 1048            If you experience worsening of your admission symptoms, develop shortness of breath, life threatening emergency, suicidal or homicidal thoughts you must seek medical attention immediately by calling 911 or calling your MD immediately  if symptoms less severe.  You Must read  complete instructions/literature along with all the possible adverse reactions/side effects for all the Medicines you take and that have been prescribed to you. Take any new Medicines after you have completely understood and accept all the possible adverse reactions/side effects.   Please note  You were cared for by a hospitalist during your hospital stay. If you have any questions about your discharge medications or the care you received while you were in the hospital after you are discharged, you can call the unit and asked to speak with the hospitalist on call if the hospitalist that took care of you is not available. Once you are discharged, your primary care physician will handle any further medical issues. Please note that NO REFILLS for any discharge medications will be authorized once you are discharged, as it is imperative that you return to your primary care physician (or establish a relationship with a primary care physician if you do not have  one) for your aftercare needs so that they can reassess your need for medications and monitor your lab values.  DATA REVIEW:   CBC  Recent Labs  Lab 05/28/21 0548  WBC 6.8  HGB 10.7*  HCT 31.1*  PLT 170    Chemistries  Recent Labs  Lab 06/01/21 0816  NA 132*  K 4.1  CL 100  CO2 21*  GLUCOSE 250*  BUN 26*  CREATININE 1.10  CALCIUM 10.1    Microbiology Results   Recent Results (from the past 240 hour(s))  Resp Panel by RT-PCR (Flu A&B, Covid) Nasopharyngeal Swab     Status: None   Collection Time: 05/27/21  2:10 PM   Specimen: Nasopharyngeal Swab; Nasopharyngeal(NP) swabs in vial transport medium  Result Value Ref Range Status   SARS Coronavirus 2 by RT PCR NEGATIVE NEGATIVE Final    Comment: (NOTE) SARS-CoV-2 target nucleic acids are NOT DETECTED.  The SARS-CoV-2 RNA is generally detectable in upper respiratory specimens during the acute phase of infection. The lowest concentration of SARS-CoV-2 viral copies this assay can  detect is 138 copies/mL. A negative result does not preclude SARS-Cov-2 infection and should not be used as the sole basis for treatment or other patient management decisions. A negative result may occur with  improper specimen collection/handling, submission of specimen other than nasopharyngeal swab, presence of viral mutation(s) within the areas targeted by this assay, and inadequate number of viral copies(<138 copies/mL). A negative result must be combined with clinical observations, patient history, and epidemiological information. The expected result is Negative.  Fact Sheet for Patients:  EntrepreneurPulse.com.au  Fact Sheet for Healthcare Providers:  IncredibleEmployment.be  This test is no t yet approved or cleared by the [place] FDA and  has been authorized for detection and/or diagnosis of SARS-CoV-2 by FDA under an Emergency Use Authorization (EUA). This EUA will remain  in effect (meaning this test can be used) for the duration of the COVID-19 declaration under Section 564(b)(1) of the Act, 21 U.S.C.section 360bbb-3(b)(1), unless the authorization is terminated  or revoked sooner.       Influenza A by PCR NEGATIVE NEGATIVE Final   Influenza B by PCR NEGATIVE NEGATIVE Final    Comment: (NOTE) The Xpert Xpress SARS-CoV-2/FLU/RSV plus assay is intended as an aid in the diagnosis of influenza from Nasopharyngeal swab specimens and should not be used as a sole basis for treatment. Nasal washings and aspirates are unacceptable for Xpert Xpress SARS-CoV-2/FLU/RSV testing.  Fact Sheet for Patients: EntrepreneurPulse.com.au  Fact Sheet for Healthcare Providers: IncredibleEmployment.be  This test is not yet approved or cleared by the [place] FDA and has been authorized for detection and/or diagnosis of SARS-CoV-2 by FDA under an Emergency Use Authorization (EUA). This EUA will remain in  effect (meaning this test can be used) for the duration of the COVID-19 declaration under Section 564(b)(1) of the Act, 21 U.S.C. section 360bbb-3(b)(1), unless the authorization is terminated or revoked.  Performed at [facility], [address] [place],  [address]   MRSA Next Gen by PCR, Nasal     Status: None   Collection Time: 05/27/21  6:56 PM   Specimen: Nasal Mucosa; Nasal Swab  Result Value Ref Range Status   MRSA by PCR Next Gen NOT DETECTED NOT DETECTED Final    Comment: (NOTE) The GeneXpert MRSA Assay (FDA approved for NASAL specimens only), is one component of a comprehensive MRSA colonization surveillance program. It is not intended to diagnose MRSA infection nor to  guide or monitor treatment for MRSA infections. Test performance is not FDA approved in patients less than 89 years old. Performed at [facility], [address] [place], [place] [address]     RADIOLOGY:  No results found.   CODE STATUS:     Code Status Orders  (From admission, onward)           Start     Ordered   05/28/21 1431  Do not attempt resuscitation (DNR)  Continuous       Question Answer Comment  In the event of cardiac or respiratory ARREST Do not call a "code blue"   In the event of cardiac or respiratory ARREST Do not perform Intubation, CPR, defibrillation or ACLS   In the event of cardiac or respiratory ARREST Use medication by any route, position, wound care, and other measures to relive pain and suffering. May use oxygen, suction and manual treatment of airway obstruction as needed for comfort.   Comments d/w 2 sons in ICU      05/28/21 1431           Code Status History     Date Active Date Inactive Code Status Order ID Comments User Context   05/27/2021 1708 05/28/2021 1431 Partial Code [id number]  Ivor Costa, MD ED   05/27/2021 1553 05/27/2021 1708 Full Code [id number]  Ivor Costa, MD ED   07/12/2019 1432 07/13/2019 1614 Full Code [id number]   Algernon Huxley, MD Inpatient   01/09/2019 0033 01/10/2019 1749 Full Code [id number]  Lance Coon, MD Inpatient   12/05/2017 0238 12/05/2017 2109 Full Code [id number]  Saundra Shelling, MD ED   11/04/2016 0919 11/05/2016 1511 Full Code [id number]  [name], MD Inpatient   09/21/2016 1605 09/22/2016 1806 Full Code [id number]  Hower, Aaron Mose, MD ED   05/09/2016 1617 05/12/2016 1629 Full Code [id number]  Idelle Crouch, MD Inpatient   11/05/2015 0638 11/14/2015 1939 Full Code [id number]  Harrie Foreman, MD Inpatient   07/17/2015 1614 07/18/2015 1639 Full Code [id number]  [name], MD Inpatient   07/17/2015 1541 07/17/2015 1614 Full Code [id number]  [name], MD Inpatient   07/15/2015 1704 07/17/2015 1541 Full Code [id number]  Demetrios Loll, MD Inpatient        TOTAL TIME TAKING CARE OF THIS PATIENT: 35 minutes.    Fritzi Mandes M.D  [facility]    CC: Primary care physician; Kym Groom Guy Begin, MD

## 2021-06-29 DEATH — deceased

## 2021-10-14 ENCOUNTER — Ambulatory Visit: Payer: Self-pay | Admitting: Urology

## 2022-09-27 ENCOUNTER — Encounter (INDEPENDENT_AMBULATORY_CARE_PROVIDER_SITE_OTHER): Payer: Self-pay
